# Patient Record
Sex: Female | Born: 1950 | Race: Black or African American | Hispanic: No | Marital: Married | Smoking: Never smoker
Health system: Southern US, Community
[De-identification: ages and names within clinical notes are randomized; demographics above are authoritative.]

## PROBLEM LIST (undated history)

## (undated) DIAGNOSIS — C50919 Malignant neoplasm of unspecified site of unspecified female breast: Secondary | ICD-10-CM

## (undated) DIAGNOSIS — F419 Anxiety disorder, unspecified: Secondary | ICD-10-CM

## (undated) DIAGNOSIS — I499 Cardiac arrhythmia, unspecified: Secondary | ICD-10-CM

## (undated) DIAGNOSIS — M797 Fibromyalgia: Secondary | ICD-10-CM

## (undated) DIAGNOSIS — Z9221 Personal history of antineoplastic chemotherapy: Secondary | ICD-10-CM

## (undated) DIAGNOSIS — Z923 Personal history of irradiation: Secondary | ICD-10-CM

## (undated) DIAGNOSIS — M1990 Unspecified osteoarthritis, unspecified site: Secondary | ICD-10-CM

## (undated) DIAGNOSIS — I4891 Unspecified atrial fibrillation: Secondary | ICD-10-CM

## (undated) DIAGNOSIS — C50911 Malignant neoplasm of unspecified site of right female breast: Secondary | ICD-10-CM

## (undated) DIAGNOSIS — K219 Gastro-esophageal reflux disease without esophagitis: Secondary | ICD-10-CM

## (undated) DIAGNOSIS — R200 Anesthesia of skin: Secondary | ICD-10-CM

## (undated) DIAGNOSIS — I471 Supraventricular tachycardia, unspecified: Secondary | ICD-10-CM

## (undated) DIAGNOSIS — E7800 Pure hypercholesterolemia, unspecified: Secondary | ICD-10-CM

## (undated) DIAGNOSIS — Z803 Family history of malignant neoplasm of breast: Secondary | ICD-10-CM

## (undated) DIAGNOSIS — E785 Hyperlipidemia, unspecified: Secondary | ICD-10-CM

## (undated) DIAGNOSIS — H269 Unspecified cataract: Secondary | ICD-10-CM

## (undated) DIAGNOSIS — R002 Palpitations: Secondary | ICD-10-CM

## (undated) DIAGNOSIS — T7840XA Allergy, unspecified, initial encounter: Secondary | ICD-10-CM

## (undated) DIAGNOSIS — M8580 Other specified disorders of bone density and structure, unspecified site: Secondary | ICD-10-CM

## (undated) DIAGNOSIS — D649 Anemia, unspecified: Secondary | ICD-10-CM

## (undated) DIAGNOSIS — G43909 Migraine, unspecified, not intractable, without status migrainosus: Secondary | ICD-10-CM

## (undated) DIAGNOSIS — R7303 Prediabetes: Secondary | ICD-10-CM

## (undated) DIAGNOSIS — F418 Other specified anxiety disorders: Secondary | ICD-10-CM

## (undated) DIAGNOSIS — R12 Heartburn: Secondary | ICD-10-CM

## (undated) HISTORY — PX: BREAST SURGERY: SHX581

## (undated) HISTORY — DX: Unspecified osteoarthritis, unspecified site: M19.90

## (undated) HISTORY — DX: Anesthesia of skin: R20.0

## (undated) HISTORY — DX: Other specified anxiety disorders: F41.8

## (undated) HISTORY — PX: OTHER SURGICAL HISTORY: SHX169

## (undated) HISTORY — DX: Heartburn: R12

## (undated) HISTORY — DX: Gastro-esophageal reflux disease without esophagitis: K21.9

## (undated) HISTORY — DX: Anxiety disorder, unspecified: F41.9

## (undated) HISTORY — DX: Supraventricular tachycardia: I47.1

## (undated) HISTORY — DX: Supraventricular tachycardia, unspecified: I47.10

## (undated) HISTORY — DX: Hyperlipidemia, unspecified: E78.5

## (undated) HISTORY — PX: BREAST EXCISIONAL BIOPSY: SUR124

## (undated) HISTORY — DX: Anemia, unspecified: D64.9

## (undated) HISTORY — DX: Unspecified atrial fibrillation: I48.91

## (undated) HISTORY — DX: Family history of malignant neoplasm of breast: Z80.3

## (undated) HISTORY — DX: Other specified disorders of bone density and structure, unspecified site: M85.80

## (undated) HISTORY — DX: Allergy, unspecified, initial encounter: T78.40XA

## (undated) HISTORY — DX: Cardiac arrhythmia, unspecified: I49.9

## (undated) HISTORY — DX: Unspecified cataract: H26.9

## (undated) HISTORY — DX: Palpitations: R00.2

## (undated) HISTORY — DX: Prediabetes: R73.03

## (undated) HISTORY — DX: Malignant neoplasm of unspecified site of unspecified female breast: C50.919

## (undated) HISTORY — DX: Fibromyalgia: M79.7

## (undated) HISTORY — DX: Pure hypercholesterolemia, unspecified: E78.00

## (undated) HISTORY — DX: Migraine, unspecified, not intractable, without status migrainosus: G43.909

---

## 1985-07-05 HISTORY — PX: ABDOMINAL HYSTERECTOMY: SHX81

## 1995-04-19 DIAGNOSIS — J45901 Unspecified asthma with (acute) exacerbation: Secondary | ICD-10-CM | POA: Insufficient documentation

## 1995-04-19 DIAGNOSIS — B3781 Candidal esophagitis: Secondary | ICD-10-CM | POA: Insufficient documentation

## 1998-09-17 ENCOUNTER — Other Ambulatory Visit: Admission: RE | Admit: 1998-09-17 | Discharge: 1998-09-17 | Payer: Self-pay | Admitting: Obstetrics and Gynecology

## 1999-05-14 ENCOUNTER — Encounter: Admission: RE | Admit: 1999-05-14 | Discharge: 1999-05-14 | Payer: Self-pay | Admitting: Emergency Medicine

## 1999-11-23 ENCOUNTER — Emergency Department (HOSPITAL_COMMUNITY): Admission: EM | Admit: 1999-11-23 | Discharge: 1999-11-23 | Payer: Self-pay | Admitting: *Deleted

## 1999-12-02 ENCOUNTER — Encounter: Admission: RE | Admit: 1999-12-02 | Discharge: 1999-12-02 | Payer: Self-pay | Admitting: Emergency Medicine

## 1999-12-23 ENCOUNTER — Other Ambulatory Visit: Admission: RE | Admit: 1999-12-23 | Discharge: 1999-12-23 | Payer: Self-pay | Admitting: *Deleted

## 1999-12-23 ENCOUNTER — Encounter (INDEPENDENT_AMBULATORY_CARE_PROVIDER_SITE_OTHER): Payer: Self-pay

## 2000-02-02 ENCOUNTER — Ambulatory Visit (HOSPITAL_COMMUNITY): Admission: RE | Admit: 2000-02-02 | Discharge: 2000-02-02 | Payer: Self-pay | Admitting: *Deleted

## 2000-02-02 ENCOUNTER — Encounter (INDEPENDENT_AMBULATORY_CARE_PROVIDER_SITE_OTHER): Payer: Self-pay | Admitting: Specialist

## 2000-03-01 ENCOUNTER — Encounter: Admission: RE | Admit: 2000-03-01 | Discharge: 2000-03-01 | Payer: Self-pay | Admitting: *Deleted

## 2000-06-06 ENCOUNTER — Encounter: Admission: RE | Admit: 2000-06-06 | Discharge: 2000-06-06 | Payer: Self-pay | Admitting: Emergency Medicine

## 2000-06-24 DIAGNOSIS — K219 Gastro-esophageal reflux disease without esophagitis: Secondary | ICD-10-CM

## 2000-08-05 ENCOUNTER — Encounter: Admission: RE | Admit: 2000-08-05 | Discharge: 2000-11-03 | Payer: Self-pay | Admitting: Gastroenterology

## 2000-12-06 ENCOUNTER — Encounter: Admission: RE | Admit: 2000-12-06 | Discharge: 2000-12-06 | Payer: Self-pay | Admitting: Emergency Medicine

## 2001-01-30 ENCOUNTER — Other Ambulatory Visit: Admission: RE | Admit: 2001-01-30 | Discharge: 2001-01-30 | Payer: Self-pay | Admitting: *Deleted

## 2001-03-18 ENCOUNTER — Emergency Department (HOSPITAL_COMMUNITY): Admission: EM | Admit: 2001-03-18 | Discharge: 2001-03-18 | Payer: Self-pay | Admitting: Emergency Medicine

## 2001-06-30 ENCOUNTER — Encounter: Admission: RE | Admit: 2001-06-30 | Discharge: 2001-06-30 | Payer: Self-pay | Admitting: *Deleted

## 2001-07-26 ENCOUNTER — Encounter: Admission: RE | Admit: 2001-07-26 | Discharge: 2001-07-26 | Payer: Self-pay | Admitting: Emergency Medicine

## 2001-09-28 ENCOUNTER — Ambulatory Visit (HOSPITAL_COMMUNITY): Admission: RE | Admit: 2001-09-28 | Discharge: 2001-09-28 | Payer: Self-pay | Admitting: Emergency Medicine

## 2001-10-18 ENCOUNTER — Ambulatory Visit (HOSPITAL_COMMUNITY): Admission: RE | Admit: 2001-10-18 | Discharge: 2001-10-18 | Payer: Self-pay | Admitting: Emergency Medicine

## 2001-10-31 ENCOUNTER — Observation Stay (HOSPITAL_COMMUNITY): Admission: RE | Admit: 2001-10-31 | Discharge: 2001-10-31 | Payer: Self-pay | Admitting: Interventional Radiology

## 2001-11-30 ENCOUNTER — Encounter: Admission: RE | Admit: 2001-11-30 | Discharge: 2001-11-30 | Payer: Self-pay | Admitting: Emergency Medicine

## 2001-11-30 ENCOUNTER — Encounter: Payer: Self-pay | Admitting: Emergency Medicine

## 2001-12-18 ENCOUNTER — Ambulatory Visit (HOSPITAL_COMMUNITY): Admission: RE | Admit: 2001-12-18 | Discharge: 2001-12-18 | Payer: Self-pay | Admitting: Emergency Medicine

## 2001-12-18 ENCOUNTER — Encounter: Payer: Self-pay | Admitting: Emergency Medicine

## 2002-01-16 ENCOUNTER — Encounter: Payer: Self-pay | Admitting: Emergency Medicine

## 2002-01-16 ENCOUNTER — Encounter: Admission: RE | Admit: 2002-01-16 | Discharge: 2002-01-16 | Payer: Self-pay | Admitting: Emergency Medicine

## 2002-01-19 ENCOUNTER — Other Ambulatory Visit: Admission: RE | Admit: 2002-01-19 | Discharge: 2002-01-19 | Payer: Self-pay | Admitting: *Deleted

## 2002-06-27 ENCOUNTER — Encounter: Payer: Self-pay | Admitting: *Deleted

## 2002-06-27 ENCOUNTER — Encounter: Admission: RE | Admit: 2002-06-27 | Discharge: 2002-06-27 | Payer: Self-pay | Admitting: *Deleted

## 2002-11-15 ENCOUNTER — Encounter: Admission: RE | Admit: 2002-11-15 | Discharge: 2002-11-15 | Payer: Self-pay | Admitting: Emergency Medicine

## 2002-11-15 ENCOUNTER — Encounter: Payer: Self-pay | Admitting: Emergency Medicine

## 2002-12-28 ENCOUNTER — Other Ambulatory Visit: Admission: RE | Admit: 2002-12-28 | Discharge: 2002-12-28 | Payer: Self-pay | Admitting: *Deleted

## 2003-02-04 ENCOUNTER — Ambulatory Visit (HOSPITAL_COMMUNITY): Admission: RE | Admit: 2003-02-04 | Discharge: 2003-02-04 | Payer: Self-pay | Admitting: Gastroenterology

## 2003-02-04 ENCOUNTER — Encounter: Payer: Self-pay | Admitting: Gastroenterology

## 2003-11-12 ENCOUNTER — Inpatient Hospital Stay (HOSPITAL_COMMUNITY): Admission: EM | Admit: 2003-11-12 | Discharge: 2003-11-13 | Payer: Self-pay | Admitting: Internal Medicine

## 2004-03-18 ENCOUNTER — Encounter: Admission: RE | Admit: 2004-03-18 | Discharge: 2004-03-18 | Payer: Self-pay | Admitting: Emergency Medicine

## 2004-05-26 ENCOUNTER — Encounter: Admission: RE | Admit: 2004-05-26 | Discharge: 2004-05-26 | Payer: Self-pay | Admitting: Emergency Medicine

## 2004-10-13 ENCOUNTER — Ambulatory Visit: Payer: Self-pay | Admitting: Gastroenterology

## 2005-08-07 ENCOUNTER — Encounter: Admission: RE | Admit: 2005-08-07 | Discharge: 2005-08-07 | Payer: Self-pay | Admitting: Emergency Medicine

## 2005-10-25 ENCOUNTER — Ambulatory Visit (HOSPITAL_COMMUNITY): Admission: RE | Admit: 2005-10-25 | Discharge: 2005-10-25 | Payer: Self-pay | Admitting: Obstetrics

## 2005-10-28 ENCOUNTER — Encounter: Admission: RE | Admit: 2005-10-28 | Discharge: 2005-10-28 | Payer: Self-pay | Admitting: Obstetrics

## 2005-11-04 ENCOUNTER — Encounter: Admission: RE | Admit: 2005-11-04 | Discharge: 2005-11-04 | Payer: Self-pay | Admitting: Obstetrics

## 2006-02-17 ENCOUNTER — Ambulatory Visit: Payer: Self-pay | Admitting: Gastroenterology

## 2006-11-10 ENCOUNTER — Encounter: Admission: RE | Admit: 2006-11-10 | Discharge: 2006-11-10 | Payer: Self-pay | Admitting: Emergency Medicine

## 2007-01-11 ENCOUNTER — Encounter: Admission: RE | Admit: 2007-01-11 | Discharge: 2007-01-11 | Payer: Self-pay | Admitting: Emergency Medicine

## 2007-01-20 ENCOUNTER — Ambulatory Visit: Payer: Self-pay | Admitting: Gastroenterology

## 2007-02-08 ENCOUNTER — Ambulatory Visit: Payer: Self-pay | Admitting: Gastroenterology

## 2007-02-08 ENCOUNTER — Encounter (INDEPENDENT_AMBULATORY_CARE_PROVIDER_SITE_OTHER): Payer: Self-pay | Admitting: *Deleted

## 2007-02-08 ENCOUNTER — Encounter: Payer: Self-pay | Admitting: Gastroenterology

## 2007-05-16 ENCOUNTER — Encounter: Admission: RE | Admit: 2007-05-16 | Discharge: 2007-05-16 | Payer: Self-pay | Admitting: Emergency Medicine

## 2007-07-26 ENCOUNTER — Encounter
Admission: RE | Admit: 2007-07-26 | Discharge: 2007-07-26 | Payer: Self-pay | Admitting: Physical Medicine and Rehabilitation

## 2007-08-18 ENCOUNTER — Encounter: Admission: RE | Admit: 2007-08-18 | Discharge: 2007-08-18 | Payer: Self-pay | Admitting: Emergency Medicine

## 2008-01-11 ENCOUNTER — Encounter: Admission: RE | Admit: 2008-01-11 | Discharge: 2008-01-11 | Payer: Self-pay | Admitting: Emergency Medicine

## 2008-01-17 ENCOUNTER — Encounter: Admission: RE | Admit: 2008-01-17 | Discharge: 2008-01-17 | Payer: Self-pay | Admitting: Emergency Medicine

## 2008-02-20 ENCOUNTER — Encounter: Payer: Self-pay | Admitting: Gastroenterology

## 2008-07-11 ENCOUNTER — Ambulatory Visit: Payer: Self-pay | Admitting: Gastroenterology

## 2008-07-11 DIAGNOSIS — I471 Supraventricular tachycardia: Secondary | ICD-10-CM

## 2008-07-12 ENCOUNTER — Telehealth: Payer: Self-pay | Admitting: Gastroenterology

## 2008-10-01 ENCOUNTER — Encounter: Admission: RE | Admit: 2008-10-01 | Discharge: 2008-10-01 | Payer: Self-pay | Admitting: Family Medicine

## 2009-01-13 ENCOUNTER — Encounter: Admission: RE | Admit: 2009-01-13 | Discharge: 2009-01-13 | Payer: Self-pay | Admitting: Obstetrics

## 2009-01-14 ENCOUNTER — Ambulatory Visit (HOSPITAL_COMMUNITY): Admission: RE | Admit: 2009-01-14 | Discharge: 2009-01-14 | Payer: Self-pay | Admitting: Obstetrics

## 2009-04-11 HISTORY — PX: OTHER SURGICAL HISTORY: SHX169

## 2009-05-27 HISTORY — PX: OTHER SURGICAL HISTORY: SHX169

## 2009-12-25 ENCOUNTER — Emergency Department (HOSPITAL_COMMUNITY): Admission: EM | Admit: 2009-12-25 | Discharge: 2009-12-25 | Payer: Self-pay | Admitting: Emergency Medicine

## 2009-12-25 ENCOUNTER — Telehealth: Payer: Self-pay | Admitting: Gastroenterology

## 2009-12-26 DIAGNOSIS — D071 Carcinoma in situ of vulva: Secondary | ICD-10-CM | POA: Insufficient documentation

## 2009-12-29 ENCOUNTER — Ambulatory Visit: Payer: Self-pay | Admitting: Internal Medicine

## 2009-12-29 ENCOUNTER — Encounter: Payer: Self-pay | Admitting: Physician Assistant

## 2009-12-29 DIAGNOSIS — K589 Irritable bowel syndrome without diarrhea: Secondary | ICD-10-CM | POA: Insufficient documentation

## 2009-12-29 DIAGNOSIS — R141 Gas pain: Secondary | ICD-10-CM

## 2009-12-29 DIAGNOSIS — M1990 Unspecified osteoarthritis, unspecified site: Secondary | ICD-10-CM | POA: Insufficient documentation

## 2009-12-29 DIAGNOSIS — R142 Eructation: Secondary | ICD-10-CM

## 2009-12-29 DIAGNOSIS — R143 Flatulence: Secondary | ICD-10-CM

## 2010-01-01 ENCOUNTER — Encounter: Payer: Self-pay | Admitting: Physician Assistant

## 2010-01-01 ENCOUNTER — Encounter: Payer: Self-pay | Admitting: Gastroenterology

## 2010-02-11 ENCOUNTER — Encounter: Admission: RE | Admit: 2010-02-11 | Discharge: 2010-02-11 | Payer: Self-pay | Admitting: Obstetrics

## 2010-06-01 ENCOUNTER — Encounter: Payer: Self-pay | Admitting: Physician Assistant

## 2010-07-26 ENCOUNTER — Encounter: Payer: Self-pay | Admitting: Emergency Medicine

## 2010-08-04 NOTE — Letter (Signed)
Summary: Addendum/[facility]  Addendum/[facility]   Imported By: Sherian Rein 01/19/2010 08:07:44  _____________________________________________________________________  External Attachment:    Type:   Image     Comment:   External Document

## 2010-08-04 NOTE — Letter (Signed)
Summary: [facility]  [facility]   Imported By: Lester [place] 01/15/2010 09:55:04  _____________________________________________________________________  External Attachment:    Type:   Image     Comment:   External Document

## 2010-08-04 NOTE — Assessment & Plan Note (Signed)
Summary: Reflux?/ DRP patient    History of Present Illness Visit Type: Follow-up Visit Primary GI MD: Sheryn Bison MD FACP FAGA Primary Provider: n/a Requesting Provider: n/a Chief Complaint: post ER visit Reflux History of Present Illness:   PLEASANT 60 YO FEMALE KNOWN TO DR. PATTERSON WITH HX OF GERD. SHE ALSO HAS HX OF SVT-FOLLOWED BY DR. Alanda Amass. SHE HAS BEEN MAINTAINED ON  NEXIUM IN THE PAST WITH GOOD CONTROL BUT WAS TAKWN OFF ONCE IN THE PAST DUE TO ? OF DRUG INTERACTION WITH CARDIAC MEDS-HER SXS FLARED AND SHE WAS PUT BACK ON NEXIUM. AT THIS TIME SHE WAS TAKEN OFF NEXIUM ABOUT 8 MONTHS AGO AS HER SVT WAS NOT WELL CONTROLLED. SHE SAYS SHE THEN DID A 30 DAY HOLTER MONITOR THAT SHOWED SVT WHILE OFF NEXIUM. SHE HAS FLECAINIDE ADDED TO HER REGIMEN. NOW WITH C/O OPNGOING INDIGESTION,GAS, BLOATING,BELCHING AND HEARTBURN. SHE WENT TO THE ER RECENTLY WITH C/O SUBXYPHOID PAIN/SHARP. CARDIAC WAS RULED OUT AND SHE WAS STARTED ON PEPCID 20 MG DAILY. SHE FEELS BETTER. THE SUBXYPHOID PAIN IS GONE. SHE CONTINUES TO HAVE THE INDIGESTON,BLOATING,GAS ETC. SHE IS LACTOSE INTOLERANT,AVOIDS LACTOSE. SHE HAS BEEN ON AN NSAID FOR YEARS-FEELS BAD IF OFF IT FOR ANY LENGTH OF TIME.   GI Review of Systems    Reports abdominal pain, acid reflux, belching, bloating, and  heartburn.     Location of  Abdominal pain: epigastric area.    Denies chest pain, dysphagia with liquids, dysphagia with solids, loss of appetite, nausea, vomiting, vomiting blood, and  weight loss.      Reports irritable bowel syndrome.     Denies anal fissure, black tarry stools, change in bowel habit, constipation, diarrhea, diverticulosis, fecal incontinence, heme positive stool, hemorrhoids, jaundice, light color stool, liver problems, rectal bleeding, and  rectal pain.    Current Medications (verified): 1)  Levsin 0.125 Mg Tabs (Hyoscyamine Sulfate) .[name].. 1 Tablet By Mouth Every 4-6 Hours 2)  Toprol Xl 25 Mg Xr24h-Tab (Metoprolol  Succinate) .[name].. 1 and 1/2 Tablet Once Daily 3)  Diclofenac Sodium 75 Mg Tbec (Diclofenac Sodium) .[name].. 1 Tablet Two Times A Day 4)  Multivitamins   Tabs (Multiple Vitamin) .[name].. 1 Once Daily 5)  Flecainide Acetate 100 Mg Tabs (Flecainide Acetate) .[name].. 1 By Mouth Two Times A Day 6)  Crestor 5 Mg Tabs (Rosuvastatin Calcium) .[name].. 1 By Mouth Once Daily 7)  Paroxetine Hcl 20 Mg Tabs (Paroxetine Hcl) .[name].. 1 By Mouth Once Daily 8)  Famotidine 20 Mg Tabs (Famotidine) .[name].. 1 By Mouth Once Daily 9)  Gabapentin 300 Mg Caps (Gabapentin) .... 3 By Mouth Once Daily 10)  Vicodin 5-500 Mg Tabs (Hydrocodone-Acetaminophen) .... As Needed At Bedtime  Allergies (verified): 1)  ! Sulfa 2)  ! Biaxin  Past History:  Past Medical History: Current Problems:  PEPTIC STRICTURE (ICD-537.89) IRRITABLE BOWEL SYNDROME, HX OF (ICD-V12.79) GERD (ICD-530.81) ASTHMA UNSPECIFIED WITH EXACERBATION (ICD-493.92) CANDIDIASIS, ESOPHAGEAL (ICD-112.84) SVT OSTEOARTHRITIS OSTEOPOROSIS  Past Surgical History: Reviewed history from 12/26/2009 and no changes required. Hysterectomy-partial Left Breast Biopsy Wide local excision of vulva  Family History: Reviewed history from 12/26/2009 and no changes required. No FH of Colon Cancer: Family History of Stomach Cancer:aunt Family History of Breast Cancer:siblings Family History of Heart Disease:  Uncle-PUD  Social History: Reviewed history from 07/11/2008 and no changes required. Patient has never smoked.  Alcohol Use - no Illicit Drug Use - no  Review of Systems  The patient denies allergy/sinus, anemia, anxiety-new, arthritis/joint pain, back pain, blood in urine, breast  changes/lumps, change in vision, confusion, cough, coughing up blood, depression-new, fainting, fatigue, fever, headaches-new, hearing problems, heart murmur, heart rhythm changes, itching, menstrual pain, muscle pains/cramps, night sweats, nosebleeds, pregnancy symptoms, shortness of breath, skin  rash, sleeping problems, sore throat, swelling of feet/legs, swollen lymph glands, thirst - excessive , urination - excessive , urination changes/pain, urine leakage, vision changes, and voice change.         OTHERWISE AS IN HPI  Vital Signs:  Patient profile:   60 year old female Height:      64 inches Weight:      150 pounds BMI:     25.84 Pulse rate:   70 / minute Pulse rhythm:   regular BP sitting:   130 / 62  (right arm)  Vitals Entered By: Chales Abrahams CMA Duncan Dull) (December 29, 2009 3:09 PM)  Physical Exam  General:  Well developed, well nourished, no acute distress. Head:  Normocephalic and atraumatic. Eyes:  PERRLA, no icterus. Lungs:  Clear throughout to auscultation. Heart:  Regular rate and rhythm; no murmurs, rubs,  or bruits. Abdomen:  SOFT, BASICALLY NONTENDER, BS HYPERACTIVE,SLIGHT DISTENTION, NO MASS OR HSM Rectal:  NOT DONE Extremities:  No clubbing, cyanosis, edema or deformities noted. Neurologic:  Alert and  oriented x4;  grossly normal neurologically. Psych:  Alert and cooperative. Normal mood and affect.   Impression & Recommendations:  Problem # 1:  GERD (ICD-530.81) Assessment Deteriorated RECENT EXACERBATION WITH SUBXYPHOID PAIN-IMPROVED ON PEPCID.  CONTINUE PEPCID, BUT INCREASE DOSE TO TWICE DAILY FOR NOW WILL DISCUSS WITH DR Alanda Amass REGARDING RESTARTING NEXIUM 40 MG DAILY. DISCUSSED PPI'S,INCREASED OSTEOPOROSIS RISK ETC-SHE WILL RESTART CALCIUM /VIT D 1200-1500 MG DAILY  Problem # 2:  FLATULENCE-GAS-BLOATING (ICD-787.3) Assessment: Unchanged CONSISTENT WITH IBS/DYSPEPSIA  TRIAL OF ALIGN ONE DAILY X 30 DAYS FOLLOW UP WITH DR. PATTERSON AS NEEDED.  Problem # 3:  PSVT (ICD-427.0) Assessment: Comment Only  Problem # 4:  OSTEOARTHRITIS (ICD-715.9) Assessment: Comment Only  Patient Instructions: 1)  Please increase your Pepcid to 20 mg 1 tablet two times a day. A new prescription has been sent electronically to your pharmacy for this  medication. 2)  Please take Align 1 capsule by mouth once daily x 30 days. This can be purchased over the counter. 3)  Begin an over the counter Calcium + Vitamin D supplement. You should try to get 1,200-1,500 mg tablets to take once daily. 4)  We have sent a letter to Dr Alanda Amass for clearance to start you  back on Nexium. You should hear from [place] regarding this very soon. Should you not, please call us. 5)  Copy sent to : Dr Sheryn Bison 6)  The medication list was reviewed and reconciled.  All changed / newly prescribed medications were explained.  A complete medication list was provided to the patient / caregiver. Prescriptions: LEVSIN 0.125 MG TABS (HYOSCYAMINE SULFATE) 1 tablet by mouth every 4-6 hours  #30 x 2   Entered by:   Lamona Curl CMA (AAMA)   Authorized by:   Sammuel Cooper PA-c   Signed by:   Lamona Curl CMA (AAMA) on 12/29/2009   Method used:   [facility]  [address] [phone][facility] (retail[facility]       [address]       [place], [place]  [address]       Ph: [phone] or [phone]  Fax: [phone]   RxID:   [id number] FAMOTIDINE 20 MG TABS (FAMOTIDINE) Take 1 tablet by mouth two times a day  #60 x 3   Entered by:   Lamona Curl CMA (AAMA)   Authorized by:   Sammuel Cooper PA-c   Signed by:   Lamona Curl CMA (AAMA) on 12/29/2009   Method used:   [facility]        [facility]  [address] [phone][facility] (retail)       [address]       [place], [place]  [address]       Ph: [phone] or [phone]       Fax: [phone]   RxID:   [id number]

## 2010-08-04 NOTE — Letter (Signed)
Summary: ? NEXIUM/[facility]  [facility]'s response on NEXIUM/[facility]   Imported By: Lester Wendell 01/01/2010 08:45:51  _____________________________________________________________________  External Attachment:    Type:   Image     Comment:   External Document

## 2010-08-04 NOTE — Progress Notes (Signed)
Summary: triage   Phone Note Call from Patient Call back at Home Phone [phone] Call back at [phone]   Caller: Patient Call For: Jarold Motto Reason for Call: Talk to Nurse Summary of Call: Patient wants to be seen before first available 7-1 states that she was in the er last night and has pain on her sides Initial call taken by: Tawni Levy,  December 25, 2009 11:39 AM  Follow-up for Phone Call        Pt c/o pain in chest, under left breast.  Pain comes and goes and seems to be worse if stomach is empty.  Went to ER and was todl Heart was OK and that she should see GI.  Appt sch for pt to see Amy Esterwood on 12/29/09 Follow-up by: Ashok Cordia RN,  December 25, 2009 2:18 PM

## 2010-08-04 NOTE — Letter (Signed)
Summary: To Dr Kristopher Oppenheim Nexium?  [facility]  [address] [place], [place] [address]   Phone: [phone]  Fax: [phone]               12/29/2009  Tami [name] [address] [place], [place]  [address]  Dear Dr Alanda Amass,  It has recently come to our attention this patient was asked by your office to discontinue her Nexium due to concerns over a possible drug interaction. Ms.Massi has severe gastroesophageal reflux which was well controlled with Nexium. Since being taken off of the medication, she has again become very symptomatic.   We request your permission to again reintroduce Nexium to Ms.Lawhorne's current medication regimen. We appreciate your prompt response to this matter. Should you have any questions, please contact our office at [phone].  Sincerely,     Amy Esterwood, PA-C  Appended Document: To Dr Kristopher Oppenheim Nexium? Letter faxed on 12/29/09 @ 3:49 pm.

## 2010-08-21 HISTORY — PX: DOPPLER ECHOCARDIOGRAPHY: SHX263

## 2010-09-11 ENCOUNTER — Telehealth: Payer: Self-pay | Admitting: Gastroenterology

## 2010-09-14 ENCOUNTER — Ambulatory Visit (INDEPENDENT_AMBULATORY_CARE_PROVIDER_SITE_OTHER): Payer: BC Managed Care – PPO | Admitting: Physician Assistant

## 2010-09-14 ENCOUNTER — Other Ambulatory Visit: Payer: Self-pay | Admitting: Gastroenterology

## 2010-09-14 ENCOUNTER — Ambulatory Visit (HOSPITAL_COMMUNITY)
Admission: RE | Admit: 2010-09-14 | Discharge: 2010-09-14 | Disposition: A | Discharge status: Home / Self Care | Payer: BC Managed Care – PPO | Source: Ambulatory Visit | Attending: Gastroenterology | Admitting: Gastroenterology

## 2010-09-14 ENCOUNTER — Encounter: Payer: Self-pay | Admitting: Physician Assistant

## 2010-09-14 DIAGNOSIS — R1011 Right upper quadrant pain: Secondary | ICD-10-CM

## 2010-09-14 DIAGNOSIS — I10 Essential (primary) hypertension: Secondary | ICD-10-CM | POA: Insufficient documentation

## 2010-09-14 DIAGNOSIS — R1013 Epigastric pain: Secondary | ICD-10-CM | POA: Insufficient documentation

## 2010-09-14 DIAGNOSIS — R52 Pain, unspecified: Secondary | ICD-10-CM

## 2010-09-14 DIAGNOSIS — R079 Chest pain, unspecified: Secondary | ICD-10-CM | POA: Insufficient documentation

## 2010-09-15 NOTE — Progress Notes (Signed)
Summary: triage   Phone Note Call from Patient Call back at [facility] [phone]   Caller: Patient Call For: Dr Jarold Motto Reason for Call: Talk to Nurse Summary of Call: Patient wants to be seen sooner than first available 4-5 for rlq pain x2 weeks. Initial call taken by: Tawni Levy,  September 11, 2010 11:16 AM  Follow-up for Phone Call        patient has been seeing her primary care for constipation and abdominal pain.  She reports that she was given "something to clean her out", but still feels she is very constipatied.  Patient will come in and see Mike Gip PA on 09/14/10 9:30 Follow-up by: Darcey Nora RN, CGRN,  September 11, 2010 11:56 AM

## 2010-09-20 LAB — CBC
HCT: 36.2 % (ref 36.0–46.0)
MCHC: 34.7 g/dL (ref 30.0–36.0)
RDW: 12.8 % (ref 11.5–15.5)

## 2010-09-20 LAB — URINALYSIS, ROUTINE W REFLEX MICROSCOPIC
Bilirubin Urine: NEGATIVE
Glucose, UA: NEGATIVE mg/dL
Hgb urine dipstick: NEGATIVE
Ketones, ur: NEGATIVE mg/dL
Protein, ur: NEGATIVE mg/dL
Urobilinogen, UA: 0.2 mg/dL (ref 0.0–1.0)
pH: 8 (ref 5.0–8.0)

## 2010-09-20 LAB — POCT CARDIAC MARKERS
CKMB, poc: <1 ng/mL — ABNORMAL LOW (ref 1.0–8.0)
Myoglobin, poc: 55 ng/mL (ref 12–200)

## 2010-09-20 LAB — BASIC METABOLIC PANEL: Sodium: 136 mEq/L (ref 135–145)

## 2010-09-20 LAB — DIFFERENTIAL
Basophils Absolute: 0 10*3/uL (ref 0.0–0.1)
Eosinophils Relative: 1 % (ref 0–5)
Lymphocytes Relative: 36 % (ref 12–46)
Lymphs Abs: 2.5 10*3/uL (ref 0.7–4.0)
Monocytes Absolute: 0.7 10*3/uL (ref 0.1–1.0)

## 2010-09-20 LAB — D-DIMER, QUANTITATIVE: D-Dimer, Quant: 0.3 ug/mL-FEU (ref 0.00–0.48)

## 2010-09-22 NOTE — Letter (Signed)
Summary: [facility]  [facility]   Imported By: Lester [place] 09/16/2010 10:25:06  _____________________________________________________________________  External Attachment:    Type:   Image     Comment:   External Document

## 2010-09-22 NOTE — Assessment & Plan Note (Signed)
Summary: Abdominal Pain    History of Present Illness Visit Type: Follow-up Visit Primary GI MD: Sheryn Bison MD Tami [name] Primary Provider: Clydie Braun Richter,MD Requesting Provider: n/a Chief Complaint: Abdominal Pain, RUQ.Tami KitchenPt states she did a bowel purge recommended by her PCP.Tami KitchenMarland KitchenWhich she did but there is still no relief History of Present Illness:   VERY NICE 60 YO FEMALE KNOWN TO DR. PATTERSON. SHE HAS HX OF GERD, AND IBS. SHE COMES IN TODAY WITH ACUTE RUQ PAIN X 7-8 DAYS. SHE WAS SEEN AT [facility] LAST WEEK ,AND TOLD SHE WAS CONSTIPATED BY ABDOMINAL FILMS. SHE WAS GIVEN A LAXATIVE PURGE. SHE HAS GOOD RESULTS WITH THIS PREP BUT NO IMPROVEMENT IN HER PAIN. SHE DESCRIBES THE PAIN AS CONSTANT,LOCATED IN  THE RUQ UNDER HER BREAST, NONRADIATING. IT IS WORSE WITH MOVEMENT, ESPECIALLY TRYING TO GET OUT OF BED OR IN TO A CAR. SHE FEELS BEST IN A RECLINER. NO FEVER, CHILLS. NO MELENA OR HEME. SHE HAS HAD SOME NAUSEA, NO VOMITING, AND FEELS MORE PRESSURE AFTER EATINGNO DYSURIA, NO SOB. SHE HAS NOT INJURED HERSELF THAT SHE IS AWARE OF , BUT AFTER FURTHER QUESTIONING SHE HAD BEEN MOVING BOXES FROM HER HOUSE TO AN OUTDOOR SHED THE WEEKEND THE PAIN STARTED.   GI Review of Systems    Reports abdominal pain and  nausea.     Location of  Abdominal pain: RUQ.    Denies acid reflux, belching, bloating, chest pain, dysphagia with liquids, dysphagia with solids, heartburn, loss of appetite, vomiting, vomiting blood, weight loss, and  weight gain.        Denies anal fissure, black tarry stools, change in bowel habit, constipation, diarrhea, diverticulosis, fecal incontinence, heme positive stool, hemorrhoids, irritable bowel syndrome, jaundice, light color stool, liver problems, rectal bleeding, and  rectal pain.    Current Medications (verified): 1)  Levsin 0.125 Mg Tabs (Hyoscyamine Sulfate) .Tami [name].. 1 Tablet By Mouth Every 4-6 Hours 2)  Toprol Xl 25 Mg Xr24h-Tab (Metoprolol Succinate) .Tami [name].. 1 and 1/2  Tablet Once Daily 3)  Diclofenac Sodium 75 Mg Tbec (Diclofenac Sodium) .Tami [name].. 1 Tablet Two Times A Day 4)  Multivitamins   Tabs (Multiple Vitamin) .Tami [name].. 1 Once Daily 5)  Flecainide Acetate 50 Mg Tabs (Flecainide Acetate) .Tami [name].. 1 By Mouth Two Times A Day 6)  Crestor 10 Mg Tabs (Rosuvastatin Calcium) .Tami [name].. 1 By Mouth Once Daily 7)  Paroxetine Hcl 20 Mg Tabs (Paroxetine Hcl) .Tami [name].. 1 By Mouth Once Daily 8)  Famotidine 20 Mg Tabs (Famotidine) .... Take 1 Tablet By Mouth Two Times A Day 9)  Gabapentin 300 Mg Caps (Gabapentin) .... 3 By Mouth Once Daily 10)  Vicodin 5-500 Mg Tabs (Hydrocodone-Acetaminophen) .... As Needed At Bedtime 11)  Nexium 40 Mg  Cpdr (Esomeprazole Magnesium) .Tami [name].. 1 Capsule Each Day 30 Minutes Before Meal 12)  Omega Xl .... 2 By Mouth Two Times A Day  Allergies (verified): 1)  ! Sulfa 2)  ! Biaxin  Past History:  Past Surgical History: Last updated: 12/26/2009 Hysterectomy-partial Left Breast Biopsy Wide local excision of vulva  Family History: Last updated: 12/26/2009 No FH of Colon Cancer: Family History of Stomach Cancer:aunt Family History of Breast Cancer:siblings Family History of Heart Disease:  Uncle-PUD  Social History: Last updated: 09/14/2010 Patient has never smoked. ,MARRIED Alcohol Use - no Illicit Drug Use - no  Past Medical History: Current Problems:  PEPTIC STRICTURE (ICD-537.89) IRRITABLE BOWEL SYNDROME, HX OF (ICD-V12.79) GERD (ICD-530.81) ASTHMA UNSPECIFIED WITH EXACERBATION (ICD-493.92) CANDIDIASIS, ESOPHAGEAL (ICD-112.84) HX SVT OSTEOARTHRITIS  OSTEOPOROSIS  Social History: Patient has never smoked. ,MARRIED Alcohol Use - no Illicit Drug Use - no  Review of Systems  The patient denies anorexia, fever, weight loss, weight gain, vision loss, decreased hearing, hoarseness, chest pain, syncope, dyspnea on exertion, peripheral edema, prolonged cough, headaches, hemoptysis, abdominal pain, melena, hematochezia, severe  indigestion/heartburn, hematuria, incontinence, genital sores, muscle weakness, suspicious skin lesions, transient blindness, difficulty walking, depression, unusual weight change, abnormal bleeding, enlarged lymph nodes, angioedema, and breast masses.         SEE HPI  Vital Signs:  Patient profile:   60 year old female Height:      64 inches Weight:      158 pounds BMI:     27.22 BSA:     1.77 Pulse rate:   80 / minute Pulse rhythm:   regular BP sitting:   120 / 70  (left arm)  Vitals Entered By: Merri Ray CMA Duncan Dull) (September 14, 2010 9:43 AM)  Physical Exam  General:  Well developed, well nourished, no acute distress. Head:  Normocephalic and atraumatic. Eyes:  PERRLA, no icterus. Chest Wall:  EXQUISITELY TENDER OVER RIGHT LOWER ANTERIOR  RIBS, NO STERNAL TENDERNESS Lungs:  Clear throughout to auscultation. Heart:  Regular rate and rhythm; no murmurs, rubs,  or bruits. Abdomen:  SOFT, BASICALLY NONTENDER, NO MASS OR HSM,BS+ Rectal:  NOT DONE Extremities:  No clubbing, cyanosis, edema or deformities noted. Neurologic:  Alert and  oriented x4;  grossly normal neurologically. Psych:  Alert and cooperative. Normal mood and affect.anxious.     Impression & Recommendations:  Problem # 1:  RUQ PAIN (ICD-789.01) Assessment New 60 YO FEMALE WITH ONE WEEK HX OF CONSTANT RUQ PAIN  WHICH ON EXAM IS MUSCULOSKELETAL, AND CONSISTENT WITH COSTOCHONDRITIS OR CHEST WALL STRAIN.  LOCAL HEAT NAPROXEN 400MG  TWICE DAILY WITH FOOD OVER NEXT WEEK THEN DECREASE TO 200 MG TWICE DAILY AS NEEDED. NORFLEX ONE TWICE DAILY X 5-7 DAYS AS NEED FOR MUSCLE SPASM RIGHT LOWER ANTERIOR RIB DETAILS  AS PT HAS OSTEOPOROSIS UPPER ABDOMINAL US  -R/O GB DISEASE THOUGH DOUBT. Orders: Ultrasound Abdomen (UAS) GI Misc Procedure/ Radiology Order (GI Misc )  Problem # 2:  IRRITABLE BOWEL SYNDROME (ICD-564.1) Assessment: Comment Only  Problem # 3:  GERD (ICD-530.81) Assessment: Comment Only STABLE ON NEXIUM  40 MG ONCE DAILY  Problem # 4:  HYPERTENSION (ICD-401.9) Assessment: Comment Only  Problem # 5:  OSTEOARTHRITIS (ICD-715.9) Assessment: Comment Only  Patient Instructions: 1)  Take OTC Aleve 2 tab twice daily. 2)  [facility] [address] [address] 3)  We schedueld the Ultrasound and Rib X-ray for today the 12th. 4)  Arrive at [facility] today at 10:45 AM. Don't eat or drink anything untila after the test. 5)  Copy sent to : Dr Nadyne Coombes, MD 6)  The medication list was reviewed and reconciled.  All changed / newly prescribed medications were explained.  A complete medication list was provided to the patient / caregiver. Prescriptions: NORFLEX 100 MG take 1 tab twice daily  #30 x 0   Entered by:   Lowry Ram NCMA   Authorized by:   Sammuel Cooper PA-c   Signed by:   Lowry Ram NCMA on 09/14/2010   Method used:   Printed then faxed to ...       [facility]  [address] [phone]* (retail)       [address]  [place], [place]  [address]       Ph: [phone] or [phone]       Fax: [phone]   RxID:   [id number]

## 2010-11-17 NOTE — Assessment & Plan Note (Signed)
[facility]                         GASTROENTEROLOGY OFFICE NOTE   Tami [name], Tami [name]                    MRN:          [id number]  DATE:01/20/2007                            DOB:          1950-08-16    Tami [name] comes in for a yearly followup but complains of worsening  constipation with gas and bloating.  She is not having acid reflux but  does continue to have some choking sensation in her retropharyngeal  area.   She is taking:  1. Protonix 40 mg twice a day.  2. Toprol XL 25 mg a day.  3. Actonel weekly.  4. Premarin 0.625 mg a day.   All of her acid reflux symptoms are worse several days after taking her  Actonel.   Her last endoscopy was in January 2004, at which time she did have a  peptic stricture that was dilated.  Her last colonoscopy was 10 years  ago.  The patient has had in the past normal ultrasounds of her abdomen  last completed in our office in January 2004.   FAMILY HISTORY:  Noncontributory in terms of colon cancer.   PHYSICAL EXAMINATION:  GENERAL:  She is a healthy-appearing African  American female in no distress appearing her stated age.  VITAL SIGNS:  She weighs 128 pounds, blood pressure 120/82, pulse was 64  and regular.  I could not appreciate stigmata of chronic liver disease.  ORAL CAVITY:  Examination of the tongue and oropharynx were normal  without evidence of Candida plaques.  CHEST:  Clear.  HEART:  She was in a regular rhythm without murmurs, gallops, or rubs.  ABDOMEN:  She had no hepatosplenomegaly, masses, or tenderness.  Bowel  sounds were normal.  RECTAL:  Deferred.   ASSESSMENT:  1. Chronic gastroesophageal reflux disease and probable recurrent      peptic stricture of the esophagus.  She does have a history of      previous Candida esophagitis, but I cannot see oral lesions at this      time.  2. Chronic functional constipation with a need for followup      colonoscopy which has not been done in  the last 10 years.  3. Probable irritable bowel syndrome.  She previously was seen on      February 17, 2006, with gas and diarrhea and seemed to respond to      empiric treatment for bacterial overgrowth syndrome with Xifaxan      and Probiotic therapy at that time.  4. Osteoporosis on Actonel therapy.  5. History of CREST syndrome with a history of chronically elevated      sed rate.  I suspect that she may have an associated esophageal      motility disorder with her Raynaud phenomenon.  This certainly      would lead to chronic reflux necessitating twice a day proton pump      inhibitor therapy.   RECOMMENDATIONS:  1. I have renewed her Protonix 40 mg twice a day before breakfast and      supper.  2. High fiber diet and fiber supplements as tolerated.  3. She also has been urged to use liberal p.o. fluids.  4. Followup endoscopy and colonoscopy exams with possible esophageal      dilatation.  5. Continue other medications per Dr. Lorenz [name].     Tami Rea. Jarold Motto, MD, Caleen Essex, FAGA  Electronically Signed    DRP/MedQ  DD: 01/20/2007  DT: 01/20/2007  Job #: [id number]   cc:   Tami [name], M.D.

## 2010-11-20 NOTE — Discharge Summary (Signed)
NAME:  Tami [name], Tami [name]                       ACCOUNT NO.:  [id number]   MEDICAL RECORD NO.:  [id number]                   PATIENT TYPE:  INP   LOCATION:  6524                                 FACILITY:  [facility]   PHYSICIAN:  Richard A. Alanda Amass, M.D.          DATE OF BIRTH:  [birth year]   DATE OF ADMISSION:  11/12/2003  DATE OF DISCHARGE:  11/13/2003                                 DISCHARGE SUMMARY   DISCHARGE DIAGNOSES:  1. Chest pain, ruled out myocardial infarction protocol by negative serial     enzymes and no electrocardiogram changes.  Improved at the time of     discharge. Could be secondary to known gastroesophageal reflux disease.  2. Gastroesophageal reflux disease on chronic high-dose PPI therapy.  3. Asthma.  4. Anxiety.  5. History of palpitations and paroxysmal atrial fibrillation.   HISTORY OF PRESENT ILLNESS:  This is a 60 year old, pleasant, African-  American female with a history of PAF and palpitations.  She developed mid  substernal and upper epigastric pain during lunch time yesterday which she  described as being 10/10 and lasted approximately 10 minutes. She was very  anxious and short of breath at that time but did not have any nausea or  diaphoresis.  The patient presented to the emergency room, was assessed by  Dr. Sherrie Mustache, internal medicine physician, and our group was called for  consult.  Dr. Alanda Amass saw the patient and, because of the nature of  clinical complaints and difficulties differentiating this pain from her  gastroesophageal reflux disease and positive family history of coronary  artery disease, she was admitted for observation and rule out MI protocol.   HOSPITAL COURSE:  During admission, she still had some mild chest  discomfort, but it was significantly improved after sublingual  nitroglycerin.  Serial enzymes were negative for myocardial infarction, and  EKG did not show any significant ST-T wave changes.   The next morning, the  patient was assessed by Dr. Allyson Sabal and scheduled for  outpatient Cardiolite in our office today.  Then she will be seen by Dr.  Alanda Amass in followup.   LABORATORY AND X-RAY DATA:  TSH and lipid profile are pending.  Cardiac  enzymes negative x 3.  Sodium 138, potassium 3.5.  She was given 40 mEq  potassium prior to her discharge.  CO2 22, chloride 113, BUN 7, creatinine  0.8, serum glucose 124.  CBC showed white blood cells 5.8, hemoglobin 12.3,  hematocrit 36, platelet count 218.   DISPOSITION:  The patient is being discharged in stable condition from [facility] for outpatient Cardiolite stress test in our office.   She was instructed to hold Toprol today prior to the test and, for now, hold  Diltiazem since her blood pressure today is 110 and heart rate 62.  Following recommendations will be based on the result of Cardiolite and  followup appointment with Dr. Alanda Amass.      [employer]  Carolyne Littles.                    Richard A. Alanda Amass, M.D.    MK/MEDQ  D:  11/13/2003  T:  11/13/2003  Job:  [id number]

## 2010-11-20 NOTE — Op Note (Signed)
[facility]  Patient:    Tami [name]                     MRN: [id number] Proc. Date: 02/02/00 Adm. Date:  [id number] Attending:  Pleas Koch CC:         Reuben Likes, M.D.                           Operative Report  PREOPERATIVE DIAGNOSIS:       Carcinoma in situ of vulva.  POSTOPERATIVE DIAGNOSIS:      Carcinoma in situ of vulva.  OPERATION:                    Wide local excision of vulva.  SURGEON:                      Georgina Peer, M.D.  ASSISTANT:  ANESTHESIA:                   IV sedation with 13 cc of 1% Xylocaine plus 0.25% Marcaine plain mixed 50/50.  ESTIMATED BLOOD LOSS:         Less than 20 cc.  COMPLICATIONS:                None.  INDICATIONS:                  A 60 year old African-American female with a vulvar lesion which was biopsied showing a carcinoma in situ with condylomatous change.  The patient is brought in for wide local excision.  She had no inguinal lymphadenopathy or induration around the area.  DESCRIPTION OF PROCEDURE:     The patient was taken to the operating room and placed in the dorsal lithotomy position.  She was given IV sedation and the area was infiltrated with 13 cc of a mixture of 1% Xylocaine and 0.25% Marcaine plain, a total of 13 cc.  After the anesthesia was documented with Rennis Harding test, an elliptical incision was made 1 cm from the lesion in all directions. The lesion was excised in the curvilinear fashion approximately 1 cm deep.  The defect was cauterized for bleeders and closed in three layers, two deep layers and a subcuticular layer using Vicryl.  There was excellent hemostasis.  The specimen was pinned on cork board on sent to pathology as marked.  Sponge, needle and instrument counts correct.  The patient tolerated the procedure well and was sent to the recovery room in good condition. DD:  02/02/00 TD:  02/03/00 Job: [id number]

## 2010-11-20 NOTE — Consult Note (Signed)
NAME:  Tami [name], Tami [name]                       ACCOUNT NO.:  [id number]   MEDICAL RECORD NO.:  [id number]                   PATIENT TYPE:  INP   LOCATION:  1830                                 FACILITY:  [facility]   PHYSICIAN:  Richard A. Alanda Amass, M.D.          DATE OF BIRTH:  31-Dec-1950   DATE OF CONSULTATION:  DATE OF DISCHARGE:                                   CONSULTATION   CONSULTING PHYSICIAN:  Richard A. Alanda Amass, M.D.   REFERRING PHYSICIANS:  1. Reuben Likes, M.D.  2. Elliot Cousin, M.D.   CHIEF COMPLAINT:  Chest pain.   BRIEF HISTORY:  The patient is a 60 year old black female with a history of  PAF or palpitations.  Today, she had lunch and developed mid sternum lower  one third chest pain which she described as 10/10.  It lasted approximately  10 minutes.  She was anxious, was short of breath, and had palpitations  during the episode.  She denied diaphoresis and no nausea.  She took a  Lopressor which she takes for her palpitations and then when the EMTs  arrived, she was given a nitro, and her pain was completely relieved.  This  occurred around 12:30 or 1 p.m. today.  She has had no pain since.  She has  been seen in the ER and Dr. Alanda Amass was contacted around 9 tonight and  asked to see the patient.  Her point-of-care markers are all negative.  Admission labs thus far show a pH of 7.37, pCO2 of 41, a bicarb of 23.  White count is 5.8, hemoglobin is 12.2, hematocrit is 36, platelet count is  218,000.  Chemistries showed a sodium of 140, a potassium of 4, chloride of  107, glucose of 85, and a BUN of 11.  There is no creatinine available.   PAST MEDICAL HISTORY:  1. Positive for palpitations.  2. GERD.  3. She had a hysterectomy for a precancerous growth.   ALLERGIES:  None known.   CURRENT MEDICATIONS:  1. Diltiazem 180 mg every day.  2. Toprol XL 37.5 mg every day.  3. Protonix 40 mg b.i.d.  4. Flonase nasal spray daily.  5. Claritin 10 mg every  day.   FAMILY HISTORY:  Her mother is living at age 52 in good health with some  hypertension.  Her father died of a trauma.  He had a history of coronary  artery disease and phlebitis.  He died at age 66.  She has one brother in  good health.  Two sisters, one with diabetes, hypertension, prior MI and the  second sister is described as having major problems.   SOCIAL HISTORY:  The patient is married.  She has two daughters, one  grandchild.  She is a Geophysicist/field seismologist in kindergarten and describes her  current life as fairly stressful.   REVIEW OF SYSTEMS:  CV:  Frequent headaches, 1-2 per week with high stress.  No history  of stroke.  No history of syncope.  PULMONARY:  She has never  smoked.  No history of wheezing.  No PND.  No orthopnea.  She does have a  history of asthma, and it is usually exacerbated with upper respiratory  tract infections.  GI:  Positive for gastroesophageal reflux disease.  It  has been a long term problem.  She also has intermittent diarrhea and  constipation.  She complains of her calf aching frequently.  She has a  history of osteoporosis.   SOCIAL HISTORY:  She is married, lives with her husband, has two grown  children, one granddaughter, and works as a Geophysicist/field seismologist.  She has  never smoked.  She does not use alcohol.  No drug history.   PHYSICAL EXAMINATION:  GENERAL:  This is a well-developed, well-nourished,  anxious, white female in no acute distress.  VITAL SIGNS:  Her blood pressure, on admission, was 118/78, pulse was 56,  respirations were 20, temperature was 97.  HEENT:  Within normal limits.  NECK:  No bruits.  CHEST:  Clear to auscultation and percussion.  HEART:  No murmurs, gallops or rubs.  ABDOMEN:  Soft, nontender.  Positive bowel sounds.  No hepatosplenomegaly.  GU:  Deferred.  RECTAL:  Deferred.  EXTREMITIES:  No clubbing, cyanosis, or edema.  She has good distal pulses.  NEUROLOGIC:  No focal deficits.   IMPRESSION:  1.  Chest pain with history of palpitations/paroxysmal atrial fibrillation.  2. Positive gastroesophageal reflux disease.  3. Asthma.  4. Anxiety.  5. Status post hysterectomy for a precancerous growth.   EKG shows a sinus rhythm with a PR interval of 220 msec.  No other ST-T wave  changes.   PLAN:  We will plan to admit overnight and observe, continue her CK and  troponins, recheck her EKG, her TSH, and fasting lipids.  We will make a  final decision about disposition in the morning after that data is  available.     Eber Hong, P.A.                 Richard A. Alanda Amass, M.D.    WDJ/MEDQ  D:  11/12/2003  T:  11/12/2003  Job:  [id number]   cc:   Gerlene Burdock A. Alanda Amass, M.D.  [phone] [address]  [place]  [place] [address]  Fax: [phone]   Reuben Likes, M.D.  [address]  [place]  [place] [address]  Fax: [phone]

## 2010-11-22 ENCOUNTER — Other Ambulatory Visit: Payer: Self-pay | Admitting: Gastroenterology

## 2011-04-08 ENCOUNTER — Other Ambulatory Visit: Payer: Self-pay | Admitting: Obstetrics

## 2011-04-08 ENCOUNTER — Other Ambulatory Visit: Payer: Self-pay | Admitting: Family Medicine

## 2011-04-08 DIAGNOSIS — Z1231 Encounter for screening mammogram for malignant neoplasm of breast: Secondary | ICD-10-CM

## 2011-05-04 ENCOUNTER — Ambulatory Visit: Payer: BC Managed Care – PPO

## 2011-05-31 ENCOUNTER — Ambulatory Visit
Admission: RE | Admit: 2011-05-31 | Discharge: 2011-05-31 | Disposition: A | Discharge status: Home / Self Care | Payer: BC Managed Care – PPO | Source: Ambulatory Visit | Attending: Obstetrics | Admitting: Obstetrics

## 2011-05-31 DIAGNOSIS — Z1231 Encounter for screening mammogram for malignant neoplasm of breast: Secondary | ICD-10-CM

## 2011-06-02 HISTORY — PX: OTHER SURGICAL HISTORY: SHX169

## 2011-07-28 ENCOUNTER — Encounter: Payer: Self-pay | Admitting: Surgery

## 2011-07-30 ENCOUNTER — Encounter: Payer: Self-pay | Admitting: Surgery

## 2011-08-02 ENCOUNTER — Other Ambulatory Visit: Payer: BC Managed Care – PPO

## 2011-08-02 ENCOUNTER — Encounter: Payer: BC Managed Care – PPO | Admitting: Surgery

## 2011-08-20 ENCOUNTER — Encounter: Payer: Self-pay | Admitting: Surgery

## 2011-08-23 ENCOUNTER — Ambulatory Visit (INDEPENDENT_AMBULATORY_CARE_PROVIDER_SITE_OTHER): Payer: BC Managed Care – PPO | Admitting: Surgery

## 2011-08-23 ENCOUNTER — Other Ambulatory Visit: Payer: BC Managed Care – PPO

## 2011-08-23 ENCOUNTER — Encounter: Payer: Self-pay | Admitting: Surgery

## 2011-08-23 VITALS — BP 120/76 | HR 64 | Resp 16 | Ht 63.0 in | Wt 153.0 lb

## 2011-08-23 DIAGNOSIS — R200 Anesthesia of skin: Secondary | ICD-10-CM

## 2011-08-23 DIAGNOSIS — R209 Unspecified disturbances of skin sensation: Secondary | ICD-10-CM

## 2011-08-23 NOTE — Progress Notes (Signed)
[facility]   Patient name: Tami [name] MRN: [id number] DOB: May 22, 1951 Sex: female   Referred by: Dr Alanda Amass  Reason for referral:  Chief Complaint  Patient presents with  . New Evaluation    pain & numbness right side worse than left    HISTORY OF PRESENT ILLNESS: The patient comes in today for evaluation of possible thoracic outlet syndrome. She has had worsening symptoms over the past several months particular in her right arm. She states that occasionally her arm goes numb. She has awakened at night with her a arm and hand in significant pain. She feels constant numbness and tingling in her right fingers and these appear to be aggravated by positional maneuvers particularly when her hands her over her head. She states that she is having a hard time washing her hair because her hand is numb and begins to give out and tingle. She also has problems with working in [employer] duty similar issues. She does describe pins and needles in the tips of her fingers.  The patient also complains of cramping in her right leg. She describes her toes is becoming numb and tingling. Her symptoms are not associated with activity. She is very active and exercises 3 times a week.  The patient has a history of SVT and has been on low-dose flecainide with long acting beta blockers  Past Medical History  Diagnosis Date  . Heart palpitations   . Osteoporosis   . Hyperlipidemia   . Asthma   . SVT (supraventricular tachycardia)   . Numbness of arm     Right Arm  . Anemia     as a child  . Irregular heartbeat     Past Surgical History  Procedure Date  . Abdominal hysterectomy 1987    History   Social History  . Marital Status: Married    Spouse Name: N/A    Number of Children: N/A  . Years of Education: N/A   Occupational History  . Not on file.   Social History Main Topics  . Smoking status: Never Smoker   . Smokeless tobacco: Not on file  .  Alcohol Use:   . Drug Use:   . Sexually Active:    Other Topics Concern  . Not on file   Social History Narrative  . No narrative on file    Family History  Problem Relation Age of Onset  . Cancer Mother     skin cancer  . Diabetes Sister     Borderline  . Hypertension Sister   . Heart attack Sister   . Diabetes Brother     Borderline    Allergies as of 08/23/2011 - Review Complete 08/23/2011  Allergen Reaction Noted  . Clarithromycin  12/26/2009  . Sulfonamide derivatives      Current Outpatient Prescriptions on File Prior to Visit  Medication Sig Dispense Refill  . aspirin 81 MG tablet Take 81 mg by mouth daily.      . flecainide (TAMBOCOR) 100 MG tablet Take 50 mg by mouth 2 (two) times daily.      [name] gabapentin (NEURONTIN) 300 MG capsule Take 300 mg by mouth 3 (three) times daily.      . metoprolol succinate (TOPROL-XL) 50 MG 24 hr tablet Take 50 mg by mouth daily. Take with or immediately following a meal.      . PARoxetine (PAXIL) 20 MG tablet Take 20 mg by mouth every morning.      [name]  acetaminophen (TYLENOL) 500 MG tablet Take 500 mg by mouth every 6 (six) hours as needed.      [name] NEXIUM 40 MG capsule TAKE ONE CAPSULE BY MOUTH 30 MINUTES BEFORE A MEAL  30 capsule  6  . rosuvastatin (CRESTOR) 10 MG tablet Take 10 mg by mouth daily.         REVIEW OF SYSTEMS: Please see history of present illness for pertinent positives. She also suffers with palpitations shortness of breath with exertion pain in legs with walking asthma weakness in arms and legs numbness in her arms and legs and dizziness.  PHYSICAL EXAMINATION: General: The patient appears their stated age.  Vital signs are BP 120/76  Pulse 64  Resp 16  Ht 5\' 3"  (1.6 m)  Wt 153 lb (69.4 kg)  BMI 27.10 kg/m2  SpO2 100% HEENT:  No gross abnormalities Pulmonary: Respirations are non-labored Abdomen: Soft and non-tender  Musculoskeletal: There are no major deformities.   Neurologic: No focal weakness or  paresthesias are detected, Skin: There are no ulcer or rashes noted. Psychiatric: The patient has normal affect. Cardiovascular: There is a regular rate and rhythm without significant murmur appreciated. She has palpable pedal pulses bilaterally. She has a palpable right radial pulse which does not significantly changed with provocative maneuvers.  Diagnostic Studies: I have reviewed her duplex ultrasound from [facility] and  [facility] these findings were consistent with thoracic outlet syndrome. She had significantly diminished waveforms when an Adson's maneuver was performed.   Assessment:  Possible thoracic outlet syndrome Plan: I told the patient that her symptoms are somewhat consistent with neurogenic thoracic outlet syndrome. I do not find that this correlates with arterial thoracic outlet. For that reason I have referred her to Dr.D'Amico for further evaluation at [facility].  Regards to the problems in her right leg. I did not feel like these are vascular in origin. I believe that she needs to have her lower back evaluated and have suggested that she see her primary care physician to begin workup for degenerative back disease causing the symptoms in her right leg.     Juleen China IV, M.D. [facility] [place] Office: [phone] Pager:  [phone]

## 2011-09-13 ENCOUNTER — Other Ambulatory Visit: Payer: Self-pay | Admitting: Family Medicine

## 2011-09-13 MED ORDER — PAROXETINE HCL 20 MG PO TABS: 20.0000 mg | ORAL_TABLET | ORAL | Status: DC

## 2011-09-17 ENCOUNTER — Other Ambulatory Visit: Payer: Self-pay | Admitting: *Deleted

## 2011-09-17 MED ORDER — GABAPENTIN 300 MG PO CAPS: 300.0000 mg | ORAL_CAPSULE | Freq: Three times a day (TID) | ORAL | Status: DC

## 2011-12-27 ENCOUNTER — Other Ambulatory Visit: Payer: Self-pay | Admitting: Physician Assistant

## 2012-01-23 ENCOUNTER — Encounter: Payer: Self-pay | Admitting: Family Medicine

## 2012-01-31 ENCOUNTER — Encounter: Payer: Self-pay | Admitting: Family Medicine

## 2012-01-31 ENCOUNTER — Ambulatory Visit (INDEPENDENT_AMBULATORY_CARE_PROVIDER_SITE_OTHER): Payer: BC Managed Care – PPO | Admitting: Family Medicine

## 2012-01-31 VITALS — BP 148/80 | HR 68 | Temp 97.8°F | Resp 16 | Ht 63.0 in | Wt 153.8 lb

## 2012-01-31 DIAGNOSIS — R002 Palpitations: Secondary | ICD-10-CM

## 2012-01-31 DIAGNOSIS — R319 Hematuria, unspecified: Secondary | ICD-10-CM

## 2012-01-31 DIAGNOSIS — R300 Dysuria: Secondary | ICD-10-CM

## 2012-01-31 DIAGNOSIS — R32 Unspecified urinary incontinence: Secondary | ICD-10-CM

## 2012-01-31 LAB — POCT UA - MICROSCOPIC ONLY
Crystals, Ur, HPF, POC: NEGATIVE
Epithelial cells, urine per micros: NEGATIVE
Mucus, UA: POSITIVE
Yeast, UA: NEGATIVE

## 2012-01-31 LAB — POCT URINALYSIS DIPSTICK
Ketones, UA: NEGATIVE
Protein, UA: NEGATIVE
Spec Grav, UA: 1.02
Urobilinogen, UA: 0.2

## 2012-01-31 MED ORDER — PHENAZOPYRIDINE HCL 200 MG PO TABS: 200.0000 mg | ORAL_TABLET | Freq: Three times a day (TID) | ORAL | Status: DC | PRN

## 2012-01-31 MED ORDER — NITROFURANTOIN MONOHYD MACRO 100 MG PO CAPS: 100.0000 mg | ORAL_CAPSULE | Freq: Two times a day (BID) | ORAL | Status: AC

## 2012-01-31 NOTE — Progress Notes (Signed)
@[email or web address] @   Patient ID: Tami [name] MRN: [id number], DOB: [birth year], 61 y.o. Date of Encounter: 01/31/2012, 12:14 PM  Primary Physician: Dois Davenport., MD  Chief Complaint:  Chief Complaint  Patient presents with  . Dysuria    wednesday- has taken pyridium for pain and had old rx of cipro but it made heart race she took for 3 days    HPI: 61 y.o. year old female presents with 5 day history of dysuria, urgency, and frequency. Last UTI was 1 year ago.  hematuria initially.  Taking Cipro for 4 days which did  Not help LMP: 10 years ago. No sick contacts, recent antibiotics, or recent travels.   No vaginal discharge, back pain, fever  Past Medical History  Diagnosis Date  . Heart palpitations   . Osteoporosis   . Hyperlipidemia   . Asthma   . SVT (supraventricular tachycardia)   . Numbness of arm     Right Arm  . Anemia     as a child  . Irregular heartbeat      Home Meds: Prior to Admission medications   Medication Sig Start Date End Date Taking? Authorizing Provider  acetaminophen (TYLENOL) 500 MG tablet Take 500 mg by mouth every 6 (six) hours as needed.   Yes Historical Provider, MD  aspirin 81 MG tablet Take 81 mg by mouth daily.   Yes Historical Provider, MD  ezetimibe (ZETIA) 10 MG tablet Take 10 mg by mouth daily.   Yes Historical Provider, MD  flecainide (TAMBOCOR) 100 MG tablet Take 50 mg by mouth 2 (two) times daily.   Yes Historical Provider, MD  gabapentin (NEURONTIN) 300 MG capsule Take 1 capsule (300 mg total) by mouth 3 (three) times daily. 09/17/11  Yes Morrell Riddle, PA-C  metoprolol succinate (TOPROL-XL) 50 MG 24 hr tablet Take 50 mg by mouth daily. Take with or immediately following a meal.   Yes Historical Provider, MD  PAXIL 20 MG tablet TAKE 1 TABLET DAILY 12/27/11  Yes Ryan M Dunn, PA-C  NEXIUM 40 MG capsule TAKE ONE CAPSULE BY MOUTH 30 MINUTES BEFORE A MEAL 11/22/10   Mardella Layman, MD  rosuvastatin (CRESTOR) 10 MG tablet Take 10 mg by  mouth daily.    Historical Provider, MD    Allergies:  Allergies  Allergen Reactions  . Clarithromycin   . Sulfonamide Derivatives   . Ciprofloxacin Hcl Palpitations    History   Social History  . Marital Status: Married    Spouse Name: N/A    Number of Children: N/A  . Years of Education: N/A   Occupational History  . Not on file.   Social History Main Topics  . Smoking status: Never Smoker   . Smokeless tobacco: Not on file  . Alcohol Use:   . Drug Use:   . Sexually Active:    Other Topics Concern  . Not on file   Social History Narrative  . No narrative on file     Review of Systems: Constitutional: negative for chills, fever, night sweats or weight changes Cardiovascular: negative for chest pain or palpitations Respiratory: negative for hemoptysis, wheezing, or shortness of breath Abdominal: negative for abdominal pain, nausea, vomiting or diarrhea Dermatological: negative for rash Neurologic: negative for headache   Physical Exam: Blood pressure 148/80, pulse 68, temperature 97.8 F (36.6 C), temperature source Oral, resp. rate 16, height 5\' 3"  (1.6 m), weight 153 lb 12.8 oz (69.763 kg), SpO2 99.00%., Body mass index is 27.24 kg/(m^2). General:  Well developed, well nourished, in no acute distress. Head: Normocephalic, atraumatic, eyes without discharge, sclera non-icteric, nares are congested. Bilateral auditory canals clear, TM's are without perforation, pearly grey with reflective cone of light bilaterally. Serous effusion bilaterally behind TM's. Maxillary sinus TTP. Oral cavity moist, dentition normal. Posterior pharynx with post nasal drip and mild erythema. No peritonsillar abscess or tonsillar exudate. Neck: Supple. No thyromegaly. Full ROM. No lymphadenopathy. Lungs: Coarse breath sounds bilaterally without Clear bilaterally to auscultation without wheezes, rales, or rhonchi. Breathing is unlabored.  Heart: RRR with S1 S2. No murmurs, rubs, or gallops  appreciated. Abdomen: Soft, non-tender, non-distended with normoactive bowel sounds. No hepatosplenomegaly. No rebound/guarding. No obvious abdominal masses. McBurney's, Rovsing's, Iliopsoas, and table jar all negative. Msk:  Strength and tone normal for age. Extremities: No clubbing or cyanosis. No edema. Neuro: Alert and oriented X 3. Moves all extremities spontaneously. CNII-XII grossly in tact. Psych:  Responds to questions appropriately with a normal affect.   Labs: Results for orders placed in visit on 01/31/12  POCT URINALYSIS DIPSTICK      Component Value Range   Color, UA yellow     Clarity, UA clear     Glucose, UA neg     Bilirubin, UA neg     Ketones, UA neg     Spec Grav, UA 1.020     Blood, UA small     pH, UA 5.5     Protein, UA neg     Urobilinogen, UA 0.2     Nitrite, UA neg     Leukocytes, UA Negative    POCT UA - MICROSCOPIC ONLY      Component Value Range   WBC, Ur, HPF, POC 0-1     RBC, urine, microscopic 6-7     Bacteria, U Microscopic 1+     Mucus, UA pos     Epithelial cells, urine per micros neg     Crystals, Ur, HPF, POC neg     Casts, Ur, LPF, POC neg     Yeast, UA neg        ASSESSMENT AND PLAN:  61 y.o. year old female with hematuria.   -  Signed, Elvina Sidle, MD 01/31/2012 12:14 PM

## 2012-02-02 LAB — URINE CULTURE
Colony Count: NO GROWTH
Organism ID, Bacteria: NO GROWTH

## 2012-02-03 ENCOUNTER — Ambulatory Visit
Admission: RE | Admit: 2012-02-03 | Discharge: 2012-02-03 | Disposition: A | Discharge status: Home / Self Care | Payer: BC Managed Care – PPO | Source: Ambulatory Visit | Attending: Family Medicine | Admitting: Family Medicine

## 2012-02-03 ENCOUNTER — Other Ambulatory Visit: Payer: Self-pay | Admitting: Family Medicine

## 2012-02-03 DIAGNOSIS — R319 Hematuria, unspecified: Secondary | ICD-10-CM

## 2012-02-03 DIAGNOSIS — R300 Dysuria: Secondary | ICD-10-CM

## 2012-03-23 ENCOUNTER — Other Ambulatory Visit (HOSPITAL_COMMUNITY): Payer: Self-pay | Admitting: Urology

## 2012-03-23 DIAGNOSIS — N135 Crossing vessel and stricture of ureter without hydronephrosis: Secondary | ICD-10-CM

## 2012-03-26 ENCOUNTER — Ambulatory Visit (INDEPENDENT_AMBULATORY_CARE_PROVIDER_SITE_OTHER): Admitting: Family Medicine

## 2012-03-26 VITALS — BP 142/84 | HR 62 | Temp 98.1°F | Resp 18 | Ht 64.0 in | Wt 156.0 lb

## 2012-03-26 DIAGNOSIS — R300 Dysuria: Secondary | ICD-10-CM

## 2012-03-26 DIAGNOSIS — G609 Hereditary and idiopathic neuropathy, unspecified: Secondary | ICD-10-CM

## 2012-03-26 DIAGNOSIS — G629 Polyneuropathy, unspecified: Secondary | ICD-10-CM

## 2012-03-26 LAB — POCT SEDIMENTATION RATE: POCT SED RATE: 28 mm/hr — AB (ref 0–22)

## 2012-03-26 MED ORDER — PHENAZOPYRIDINE HCL 200 MG PO TABS: 200.0000 mg | ORAL_TABLET | Freq: Three times a day (TID) | ORAL | Status: AC | PRN

## 2012-03-26 MED ORDER — PAROXETINE HCL 20 MG PO TABS: 20.0000 mg | ORAL_TABLET | ORAL | Status: DC

## 2012-03-26 MED ORDER — TRAZODONE HCL 100 MG PO TABS: 100.0000 mg | ORAL_TABLET | Freq: Every day | ORAL | Status: DC

## 2012-03-26 NOTE — Progress Notes (Signed)
61 yo retired woman (3 years)  With h/o peripheral neuropathy who has developed right lower leg pain with hypersensitivity x 1 month.  No significant weakness. Had NCV's at [facility] In process of workup with neurologist.  Objective:  Normal inspection of RLE without muscle wasting Reflexes:  Normal AJ and KJ both sides  Clinical Data: Dysuria, hematuria  RENAL/URINARY TRACT ULTRASOUND COMPLETE  Comparison: 09/14/2010  Findings:  Right Kidney: 9.7 cm length. Mild right pelviectasis centrally.  Renal pelvis measures 8 mm. No definite obstruction. Normal  echogenicity and cortex. No atrophy. No focal mass or lesion.  Left Kidney: 10.2 cm length. Negative for hydronephrosis or acute  process. Normal cortex and echogenicity. No atrophy. Mild pelvic  fullness also.  Bladder: Unremarkable by ultrasound. Ureteral jets demonstrated  bilaterally.  IMPRESSION:  Nonspecific mild pelviectasis. Negative for obstruction or  hydronephrosis. No acute finding.  Original Report Authenticated By: Judie Petit. Ruel Favors, M.D.  Very sensitive to touch medial right mid calf  Assessment:  Peripheral neuropathy

## 2012-03-26 NOTE — Patient Instructions (Signed)
Neuropathy Neuropathy means your peripheral nerves are not working normally. Peripheral nerves are the nerves outside the brain and spinal cord. Messages between the brain and the rest of the body do not work properly with peripheral nerve disorders. CAUSES There are many different causes of peripheral nerve disorders. These include:  Injury.   Infections.   Diabetes.   Vitamin deficiency.   Poor circulation.   Alcoholism.   Exposure to toxins.   Drug effects.   Tumors.   Kidney disease.  SYMPTOMS  Tingling, burning, pain, and numbness in the extremities.   Weakness and loss of muscle tone and size.  DIAGNOSIS Blood tests and special studies of nerve function may help confirm the diagnosis.  TREATMENT  Treatment includes adopting healthy life habits.   A good diet, vitamin supplements, and mild pain medicine may be needed.   Avoid known toxins such as alcohol, tobacco, and recreational drugs.   Anti-convulsant medicines are helpful in some types of neuropathy.  Make a follow-up appointment with your caregiver to be sure you are getting better with treatment.  SEEK IMMEDIATE MEDICAL CARE IF:   You have breathing problems.   You have severe or uncontrolled pain.   You notice extreme weakness or you feel faint.   You are not better after 1 week or if you have worse symptoms.  Document Released: 07/29/2004 Document Revised: 03/03/2011 Document Reviewed: 06/21/2005 [employer] Patient Information 2012 [employer]. 

## 2012-03-27 LAB — COMPREHENSIVE METABOLIC PANEL
ALT: 37 U/L — ABNORMAL HIGH (ref 0–35)
AST: 40 U/L — ABNORMAL HIGH (ref 0–37)
Albumin: 4.6 g/dL (ref 3.5–5.2)
Alkaline Phosphatase: 91 U/L (ref 39–117)
BUN: 9 mg/dL (ref 6–23)
CO2: 27 mEq/L (ref 19–32)
Calcium: 9.9 mg/dL (ref 8.4–10.5)
Chloride: 104 mEq/L (ref 96–112)
Creat: 0.74 mg/dL (ref 0.50–1.10)
Glucose, Bld: 81 mg/dL (ref 70–99)
Potassium: 3.9 mEq/L (ref 3.5–5.3)
Sodium: 138 mEq/L (ref 135–145)
Total Bilirubin: 0.4 mg/dL (ref 0.3–1.2)
Total Protein: 7.9 g/dL (ref 6.0–8.3)

## 2012-03-27 LAB — VITAMIN B12: Vitamin B-12: 477 pg/mL (ref 211–911)

## 2012-03-27 LAB — TSH: TSH: 2.165 u[IU]/mL (ref 0.350–4.500)

## 2012-03-28 ENCOUNTER — Other Ambulatory Visit: Payer: Self-pay | Admitting: Physician Assistant

## 2012-03-28 LAB — ANA: Anti Nuclear Antibody(ANA): NEGATIVE

## 2012-03-29 ENCOUNTER — Encounter (HOSPITAL_COMMUNITY): Payer: Self-pay

## 2012-03-29 ENCOUNTER — Encounter (HOSPITAL_COMMUNITY)
Admission: RE | Admit: 2012-03-29 | Discharge: 2012-03-29 | Disposition: A | Discharge status: Home / Self Care | Payer: BC Managed Care – PPO | Source: Ambulatory Visit | Attending: Urology | Admitting: Urology

## 2012-03-29 DIAGNOSIS — R350 Frequency of micturition: Secondary | ICD-10-CM | POA: Insufficient documentation

## 2012-03-29 DIAGNOSIS — R3915 Urgency of urination: Secondary | ICD-10-CM | POA: Insufficient documentation

## 2012-03-29 DIAGNOSIS — R300 Dysuria: Secondary | ICD-10-CM | POA: Insufficient documentation

## 2012-03-29 DIAGNOSIS — N135 Crossing vessel and stricture of ureter without hydronephrosis: Secondary | ICD-10-CM | POA: Insufficient documentation

## 2012-03-29 MED ORDER — TECHNETIUM TC 99M MERTIATIDE
14.8000 | Freq: Once | INTRAVENOUS | Status: AC | PRN
  Administered 2012-03-29: 14.8 via INTRAVENOUS

## 2012-03-29 MED ORDER — FUROSEMIDE 10 MG/ML IJ SOLN
40.0000 mg | Freq: Once | INTRAMUSCULAR | Status: AC
  Administered 2012-03-29: 36 mg via INTRAVENOUS
  Filled 2012-03-29: qty 4

## 2012-03-30 ENCOUNTER — Other Ambulatory Visit: Payer: Self-pay

## 2012-03-30 MED ORDER — PAROXETINE HCL 20 MG PO TABS: 20.0000 mg | ORAL_TABLET | ORAL | Status: DC

## 2012-04-01 ENCOUNTER — Telehealth: Payer: Self-pay

## 2012-04-01 NOTE — Telephone Encounter (Signed)
Pt called needs Name Brand prescription for Paxel. Pt can not take generic. Please let pharmacy know. Pt sees Dr. Elbert Ewings. [facility] [address]  Pt phone [phone]

## 2012-04-02 NOTE — Telephone Encounter (Signed)
Please see refill from 03/30/12. I have already instructed the pharmacy to fill brand name only.

## 2012-04-25 HISTORY — PX: CYSTOSCOPY: SUR368

## 2012-05-28 ENCOUNTER — Encounter: Payer: Self-pay | Admitting: Family Medicine

## 2012-05-28 DIAGNOSIS — N1330 Unspecified hydronephrosis: Secondary | ICD-10-CM

## 2012-05-28 DIAGNOSIS — N393 Stress incontinence (female) (male): Secondary | ICD-10-CM | POA: Insufficient documentation

## 2012-07-21 ENCOUNTER — Ambulatory Visit: Payer: BC Managed Care – PPO | Admitting: Gastroenterology

## 2012-07-27 ENCOUNTER — Ambulatory Visit (INDEPENDENT_AMBULATORY_CARE_PROVIDER_SITE_OTHER): Payer: BC Managed Care – PPO | Admitting: Family Medicine

## 2012-07-27 ENCOUNTER — Encounter: Payer: Self-pay | Admitting: Family Medicine

## 2012-07-27 VITALS — BP 124/62 | HR 63 | Temp 98.9°F | Resp 16 | Ht 63.0 in | Wt 156.4 lb

## 2012-07-27 DIAGNOSIS — R319 Hematuria, unspecified: Secondary | ICD-10-CM

## 2012-07-27 DIAGNOSIS — M5431 Sciatica, right side: Secondary | ICD-10-CM

## 2012-07-27 DIAGNOSIS — Z23 Encounter for immunization: Secondary | ICD-10-CM

## 2012-07-27 DIAGNOSIS — A609 Anogenital herpesviral infection, unspecified: Secondary | ICD-10-CM

## 2012-07-27 DIAGNOSIS — M5430 Sciatica, unspecified side: Secondary | ICD-10-CM

## 2012-07-27 LAB — POCT URINALYSIS DIPSTICK
Bilirubin, UA: NEGATIVE
Glucose, UA: NEGATIVE
Ketones, UA: NEGATIVE
Leukocytes, UA: NEGATIVE
Nitrite, UA: NEGATIVE
Protein, UA: NEGATIVE
Spec Grav, UA: 1.025
Urobilinogen, UA: 0.2
pH, UA: 6

## 2012-07-27 LAB — POCT UA - MICROSCOPIC ONLY
Bacteria, U Microscopic: NEGATIVE
Casts, Ur, LPF, POC: NEGATIVE
Crystals, Ur, HPF, POC: NEGATIVE
Mucus, UA: NEGATIVE
WBC, Ur, HPF, POC: NEGATIVE
Yeast, UA: NEGATIVE

## 2012-07-27 MED ORDER — VALACYCLOVIR HCL 500 MG PO TABS: 500.0000 mg | ORAL_TABLET | Freq: Two times a day (BID) | ORAL | Status: DC | PRN

## 2012-07-27 MED ORDER — GABAPENTIN 300 MG PO CAPS: 300.0000 mg | ORAL_CAPSULE | Freq: Three times a day (TID) | ORAL | Status: DC

## 2012-07-27 NOTE — Progress Notes (Signed)
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  Some improvement with motrin.  Feels poppling in back chronically when moving side to side  Urination:  Has had urology appointment who diagnosed some stress incontinence and recommended urethral stent Stool elimination:  Some IBS, fluctuating behind diarrhea and constipation. Cares for 77 yo mother in law  Obj:  Obviously uncomfortable. SLR positive bilaterally reproducing the pain No CVAT No pain with hip internal/rotation  Abdomen:  Soft, nontender, without groin adenopathy Results for orders placed in visit on 07/27/12  POCT UA - MICROSCOPIC ONLY      Component Value Range   WBC, Ur, HPF, POC neg     RBC, urine, microscopic 0-5     Bacteria, U Microscopic neg     Mucus, UA neg     Epithelial cells, urine per micros 0-2     Crystals, Ur, HPF, POC neg     Casts, Ur, LPF, POC neg     Yeast, UA neg    POCT URINALYSIS DIPSTICK      Component Value Range   Color, UA yellow     Clarity, UA clear     Glucose, UA neg     Bilirubin, UA neg     Ketones, UA neg     Spec Grav, UA 1.025     Blood, UA moderate     pH, UA 6.0     Protein, UA neg     Urobilinogen, UA 0.2     Nitrite, UA neg     Leukocytes, UA Negative       1. Need for prophylactic vaccination and inoculation against influenza  Flu vaccine greater than or equal to 3yo preservative free IM  2. HSV (herpes simplex virus) anogenital infection  valACYclovir (VALTREX) 500 MG tablet  3. Sciatica of right side  POCT UA - Microscopic Only, POCT urinalysis dipstick, gabapentin (NEURONTIN) 300 MG capsule   Assessment:  Unexplained microscopic hematuria, sciatic type symptoms.  Plan:  Continue the gabapentin (may need prednisone depending on the MRI) Urology has been involved in the past with full workup for incontinence.   Will ask patient to follow up there, as well, within next month

## 2012-08-04 ENCOUNTER — Other Ambulatory Visit: Payer: BC Managed Care – PPO

## 2012-08-09 ENCOUNTER — Other Ambulatory Visit: Payer: BC Managed Care – PPO

## 2012-08-23 ENCOUNTER — Ambulatory Visit (INDEPENDENT_AMBULATORY_CARE_PROVIDER_SITE_OTHER): Payer: BC Managed Care – PPO | Admitting: Family Medicine

## 2012-08-23 ENCOUNTER — Encounter: Payer: Self-pay | Admitting: Family Medicine

## 2012-08-23 VITALS — BP 110/70 | HR 55 | Temp 98.4°F | Resp 16 | Ht 63.0 in | Wt 158.0 lb

## 2012-08-23 DIAGNOSIS — G894 Chronic pain syndrome: Secondary | ICD-10-CM

## 2012-08-23 DIAGNOSIS — M5136 Other intervertebral disc degeneration, lumbar region: Secondary | ICD-10-CM

## 2012-08-23 DIAGNOSIS — E7800 Pure hypercholesterolemia, unspecified: Secondary | ICD-10-CM

## 2012-08-23 DIAGNOSIS — G629 Polyneuropathy, unspecified: Secondary | ICD-10-CM

## 2012-08-23 DIAGNOSIS — M5137 Other intervertebral disc degeneration, lumbosacral region: Secondary | ICD-10-CM

## 2012-08-23 LAB — LIPID PANEL
Cholesterol: 248 mg/dL — ABNORMAL HIGH (ref 0–200)
HDL: 77 mg/dL (ref >39)
Triglycerides: 114 mg/dL (ref <150)

## 2012-08-23 MED ORDER — TRAZODONE HCL 100 MG PO TABS: 150.0000 mg | ORAL_TABLET | Freq: Every day | ORAL | Status: DC

## 2012-08-23 NOTE — Patient Instructions (Addendum)
Degenerative Disk Disease Degenerative disk disease is a condition caused by the changes that occur in the cushions of the backbone (spinal disks) as you grow older. Spinal disks are soft and compressible disks located between the bones of the spine (vertebrae). They act like shock absorbers. Degenerative disk disease can affect the whole spine. However, the neck and lower back are most commonly affected. Many changes can occur in the spinal disks with aging, such as:  The spinal disks may dry and shrink.  Small tears may occur in the tough, outer covering of the disk (annulus).  The disk space may become smaller due to loss of water.  Abnormal growths in the bone (spurs) may occur. This can put pressure on the nerve roots exiting the spinal canal, causing pain.  The spinal canal may become narrowed. CAUSES  Degenerative disk disease is a condition caused by the changes that occur in the spinal disks with aging. The exact cause is not known, but there is a genetic basis for many patients. Degenerative changes can occur due to loss of fluid in the disk. This makes the disk thinner and reduces the space between the backbones. Small cracks can develop in the outer layer of the disk. This can lead to the breakdown of the disk. You are more likely to get degenerative disk disease if you are overweight. Smoking cigarettes and doing heavy work such as weightlifting can also increase your risk of this condition. Degenerative changes can start after a sudden injury. Growth of bone spurs can compress the nerve roots and cause pain.  SYMPTOMS  The symptoms vary from person to person. Some people may have no pain, while others have severe pain. The pain may be so severe that it can limit your activities. The location of the pain depends on the part of your backbone that is affected. You will have neck or arm pain if a disk in the neck area is affected. You will have pain in your back, buttocks, or legs if a disk  in the lower back is affected. The pain becomes worse while bending, reaching up, or with twisting movements. The pain may start gradually and then get worse as time passes. It may also start after a major or minor injury. You may feel numbness or tingling in the arms or legs.  DIAGNOSIS  Your caregiver will ask you about your symptoms and about activities or habits that may cause the pain. He or she may also ask about any injuries, diseases, or treatments you have had earlier. Your caregiver will examine you to check for the range of movement that is possible in the affected area, to check for strength in your extremities, and to check for sensation in the areas of the arms and legs supplied by different nerve roots. An X-ray of the spine may be taken. Your caregiver may suggest other imaging tests, such as magnetic resonance imaging (MRI), if needed.  TREATMENT  Treatment includes rest, modifying your activities, and applying ice and heat. Your caregiver may prescribe medicines to reduce your pain and may ask you to do some exercises to strengthen your back. In some cases, you may need surgery. You and your caregiver will decide on the treatment that is best for you. HOME CARE INSTRUCTIONS   Follow proper lifting and walking techniques as advised by your caregiver.  Maintain good posture.  Exercise regularly as advised.  Perform relaxation exercises.  Change your sitting, standing, and sleeping habits as advised. Change positions   frequently.  Lose weight as advised.  Stop smoking if you smoke.  Wear supportive footwear. SEEK MEDICAL CARE IF:  Your pain does not go away within 1 to 4 weeks. SEEK IMMEDIATE MEDICAL CARE IF:   Your pain is severe.  You notice weakness in your arms, hands, or legs.  You begin to lose control of your bladder or bowel movements. MAKE SURE YOU:   Understand these instructions.  Will watch your condition.  Will get help right away if you are not doing  well or get worse. Document Released: 04/18/2007 Document Revised: 09/13/2011 Document Reviewed: 04/18/2007 [facility] Patient Information 2013 [place], [place].   To help with sleep, I have increased Trazodone to 150 mg at bedtime. Your tablet dose will still be 100 mg; take 1 1/2 tablets every night.

## 2012-08-23 NOTE — Progress Notes (Signed)
S: Pt is 62 y.o. AA female reports chronic LBP for > 10 years. Within last month, pain has been intolerable and intractable. The pain is located in lower back in posterior R hip and radiates around to R groin area. Gabapentin taken 3 times /day allows pt to function and perform ADLs. Sleep is interrupted by pain; pt cannot lay on R side and she has problems turning over in bed unassisted. Pt can stand no longer than 15 minutes; she has intense pain in lower back and R hip.  With prolonged sitting, she has difficulty getting up and she has tingling in R leg and foot. Pt has paperwork indicating that she had NCV study at [facility] in March 2013 (results not with papers).  Pt also reports episode of complete numbness of R arm last week, lasting > 20 minutes. Pain started when feeling returned to arm. Pt has crepitus in neck w/ some stiffness; this has been a problem for years.  ROS: As per HPI; negative for fever/ chills, appetite change, Cp or tightness, palpitations, SOB or DOE, saddle anesthesia or incontinence of bladder or bowel.   O:  Filed Vitals:   08/23/12 1020  BP: 110/70  Pulse: 55  Temp: 98.4 F (36.9 C)  Resp: 16   GEN: In mild distress; sitting uncomfortably on exam table (had to move to chair w/ cushion). HENT: [place]/AT; WN,WD. COR: RRR. LUNGS: Normal resp rate and effort. BACK: Significant muscle spasms in lumbosacral region. Tender R SI joint area. Forward flexion limited to 30-45 degrees. Pt cannot stand R leg. NEURO: A&O x 3; CNs intact.  Pt cannot walk on toes or heels. Gait is abnormal. DTRs- 2+/= in UEs.; R patellar- 0-1+ and L patellar 1-2+.  A/P:  DDD (degenerative disc disease), lumbar - Last MRI performed in 2010, showing DDD.  Plan: MR Lumbar Spine Wo Contrast  Chronic pain syndrome - pt has intractable pain.   Plan: MR Lumbar Spine Wo Contrast  Hypercholesteremia - pt taking Rosuvastatin 10 mg daily.   Plan: Lipid panel, ALT  Peripheral neuropathy -  Due to Lumbar  DDD; Increase Trazodone to 150 mg hs for pain relief and to improve sleep hygiene.  Plan: traZODone (DESYREL) 100 MG tablet                                         Continue Gabapentin 300 mg AM and at midday and 1-2 capsules hs  Meds ordered this encounter  Medications  . traZODone (DESYREL) 100 MG tablet    Sig: Take 1.5 tablets (150 mg total) by mouth at bedtime.    Dispense:  45 tablet    Refill:  3

## 2012-08-24 ENCOUNTER — Encounter: Payer: Self-pay | Admitting: Family Medicine

## 2012-08-24 DIAGNOSIS — M810 Age-related osteoporosis without current pathological fracture: Secondary | ICD-10-CM | POA: Insufficient documentation

## 2012-08-26 NOTE — Progress Notes (Signed)
Quick Note:  Please contact pt and advise that the following labs are abnormal... Your recent lipid panel shows elevated total cholesterol and LDL ("bad"); HDL ("good") cholesterol is very high which is very good. As we discussed at your visit, because tour HDL is so high, your risk of heart disease is about 1/2 average risk. Your can continue on the current dose of Crestor (generic) and we can check these numbers again in ~ 6 months; I do not want to increase the medication dose because of concerns about increasing potential side effects. Liver test Is normal.   Copy to pt. ______

## 2012-08-31 ENCOUNTER — Other Ambulatory Visit: Payer: Self-pay | Admitting: Family Medicine

## 2012-08-31 ENCOUNTER — Telehealth: Payer: Self-pay | Admitting: *Deleted

## 2012-08-31 DIAGNOSIS — M5137 Other intervertebral disc degeneration, lumbosacral region: Secondary | ICD-10-CM

## 2012-08-31 NOTE — Telephone Encounter (Signed)
Called patient to advise her [employer] denied the MRI of lumbar spine. Dr Audria Nine wants to try physical therapy again at [facility] for additional treatment. After the PT she will request another MRI of lumbar spine and hope the  insurance company will make an approval.

## 2012-09-05 ENCOUNTER — Telehealth: Payer: Self-pay

## 2012-09-05 NOTE — Telephone Encounter (Signed)
Pt would like to be referred to [facility] please call patient with questions

## 2012-10-19 ENCOUNTER — Ambulatory Visit (INDEPENDENT_AMBULATORY_CARE_PROVIDER_SITE_OTHER): Payer: BC Managed Care – PPO | Admitting: Family Medicine

## 2012-10-19 ENCOUNTER — Encounter: Payer: Self-pay | Admitting: Family Medicine

## 2012-10-19 VITALS — BP 128/74 | HR 60 | Temp 98.5°F | Resp 16 | Ht 63.0 in | Wt 160.0 lb

## 2012-10-19 DIAGNOSIS — Z636 Dependent relative needing care at home: Secondary | ICD-10-CM

## 2012-10-19 DIAGNOSIS — M5137 Other intervertebral disc degeneration, lumbosacral region: Secondary | ICD-10-CM

## 2012-10-19 DIAGNOSIS — M25539 Pain in unspecified wrist: Secondary | ICD-10-CM

## 2012-10-19 DIAGNOSIS — Z6379 Other stressful life events affecting family and household: Secondary | ICD-10-CM

## 2012-10-19 DIAGNOSIS — T887XXA Unspecified adverse effect of drug or medicament, initial encounter: Secondary | ICD-10-CM

## 2012-10-19 DIAGNOSIS — M25532 Pain in left wrist: Secondary | ICD-10-CM

## 2012-10-19 DIAGNOSIS — M5136 Other intervertebral disc degeneration, lumbar region: Secondary | ICD-10-CM

## 2012-10-19 MED ORDER — DICLOFENAC SODIUM 75 MG PO TBEC: 75.0000 mg | DELAYED_RELEASE_TABLET | Freq: Two times a day (BID) | ORAL | Status: DC

## 2012-10-19 NOTE — Patient Instructions (Addendum)
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 You may want to contact an agency called [employer] who can give you some direction about what resources are available to assist you..   For any joint aches and pains--get Arnica GEL and apply it 2 or 3 times a day to painful areas. I have prescribed an arthritic medication (Diclofenac) to be taken twice a day with food.

## 2012-10-23 DIAGNOSIS — Z636 Dependent relative needing care at home: Secondary | ICD-10-CM | POA: Insufficient documentation

## 2012-10-23 NOTE — Progress Notes (Signed)
S:  This 62 y.o. AA female returns for follow-up of DDD; she has been attending PT at [facility] practice Luan Pulling, PT) for 6 visits. She reports some improvement and does solidify progress w/ home exercises. Two t- four more sessions are recommended. Pt denies radicular pain in leg but does have persistent hip and mid-back pain. Sleep is interrupted by pain; pt has difficulty finding a comfortable position. Another problem has developed- L wrist pain attributed to having to push self up into sitting position when arising in the mornings. Wrist has not been swollen or red, just tender along wrist bones and with extremes of ROM.  Re: Medications- pt reports "soreness" with Zetia and Rosuvastatin; she stopped both medications and soreness subsided. Pt no longer taking Trazodone due to adverse effects (sleep disruption due to strange dreams).   PMHx, Soc Hx and Fam Hx reviewed.  ROS: As per HPI.  O:  Filed Vitals:   10/19/12 1523  BP: 128/74  Pulse: 60  Temp: 98.5 F (36.9 C)  Resp: 16   GEN: In NAD; WN,WD. HENT: [place]/AT; EOMI w/ clear conj/ sclerae. EACs/nose normal. Oroph clear and moist. NECK: Supple. No TMG. COR: RRR; no edema. LUNGS: Normal resp rate and effort. MS: L wrist- no deformity or erythema or swelling; tender radius and discomfort w/ extreme extension.  BACK: Spine is straight; tender midline lumbosacral spine and R SI joint. NEURO: A&O x 3; CNs intact. Gait is slow and antalgic, favoring R leg.  A/P: DDD (degenerative disc disease), lumbar- continue PT.  Wrist pain, left- discuss w/ PT; try  To avoid weight bearing when getting up in the morning. Use topical analgesic.  Unspecified adverse effect of unspecified drug, medicinal and biological substance- Zetia, Rosuvastatin and Trazodone discontinued.  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 She has reached a point where she realizes that she can no longer perform the duties required to provide total care to her family member. Also, her husband has cancer and she needs to reserve her strength and physical capabilities for him in the future. She will discuss the need for alternative care for mother-in-law w/ her husband's family. I shared some resources available in the community that may be helpful to the family.  Total Face-to-Face time with pt= 45 minutes.

## 2012-10-30 ENCOUNTER — Telehealth: Payer: Self-pay

## 2012-10-30 NOTE — Telephone Encounter (Signed)
Dr. Audria Nine rx'd diclofenac but pt had to stop taking as it made her sick to her stomach. (she is a Dr. Elbert Ewings patient) Would like something else called in, preferably not prednisone [facility] [phone]  Medical records--pt states she brought in her records from Dr. Melanee Spry office but she was told we have misplaced them?

## 2012-10-30 NOTE — Telephone Encounter (Signed)
If records were brought in, then they were sent to be scanned if they provider indicated to scan them.

## 2012-10-31 ENCOUNTER — Other Ambulatory Visit: Payer: Self-pay | Admitting: Family Medicine

## 2012-10-31 ENCOUNTER — Telehealth: Payer: Self-pay

## 2012-10-31 MED ORDER — MELOXICAM 7.5 MG PO TABS: 7.5000 mg | ORAL_TABLET | Freq: Every day | ORAL | Status: DC

## 2012-10-31 NOTE — Telephone Encounter (Signed)
Spoke to her about records also she states she brought these in, from Dr Lorenz Coaster can you call her about this, she is asking where they are, I can not help her.

## 2012-10-31 NOTE — Telephone Encounter (Signed)
Pt states that she has been trying for several weeks to get in touch with someone in Medical Records She is wanting someone to give her a call back today if possible Call back number is [phone]

## 2012-10-31 NOTE — Telephone Encounter (Signed)
I ordered meloxicam 7.5 mg daily at [facility].  If not better, come in to be seen.

## 2012-10-31 NOTE — Telephone Encounter (Signed)
-----   Message from Lenell Antu, DO sent at 10/30/2012 6:14 PM ----- She is not my patient. Please ask Dr. Elbert Ewings. I would rx her something but she is on Paxil and I don't know her mental status if I were to try tramadol.  Patient requesting meds, states diclofenac caused GI upset so she d/c this.

## 2012-10-31 NOTE — Telephone Encounter (Signed)
Called her to advise.  

## 2012-11-01 NOTE — Telephone Encounter (Signed)
Patient has called before in the past with the same issue; this is what was told to her- If records were brought in, then they were sent to be scanned if they provider indicated to scan them. No records that I have seen been brought in by outside office.

## 2012-12-14 ENCOUNTER — Telehealth: Payer: Self-pay | Admitting: Cardiovascular Disease

## 2012-12-14 NOTE — Telephone Encounter (Signed)
Tami [name] is asking for a letter faxed saying that it is ok to go back to physical therapy.... Please call    Thanks

## 2012-12-14 NOTE — Telephone Encounter (Signed)
Message forwarded to J.C. Wildman, LPN.  

## 2012-12-15 ENCOUNTER — Other Ambulatory Visit: Payer: Self-pay | Admitting: *Deleted

## 2012-12-15 MED ORDER — METOPROLOL SUCCINATE ER 50 MG PO TB24: 50.0000 mg | ORAL_TABLET | Freq: Every day | ORAL | Status: DC

## 2012-12-15 NOTE — Telephone Encounter (Signed)
Al Corpus to [facility] pharmacy

## 2012-12-16 NOTE — Telephone Encounter (Signed)
Per Dr. Alanda Amass it is ok for pt. To return to physical therapy. A letter has been mailed to the pt. As such.

## 2012-12-21 ENCOUNTER — Encounter: Payer: Self-pay | Admitting: Family Medicine

## 2012-12-21 ENCOUNTER — Ambulatory Visit (INDEPENDENT_AMBULATORY_CARE_PROVIDER_SITE_OTHER): Payer: BC Managed Care – PPO | Admitting: Family Medicine

## 2012-12-21 ENCOUNTER — Ambulatory Visit: Payer: BC Managed Care – PPO

## 2012-12-21 VITALS — BP 122/62 | HR 56 | Temp 99.0°F | Resp 16 | Ht 63.5 in | Wt 156.0 lb

## 2012-12-21 DIAGNOSIS — Z6379 Other stressful life events affecting family and household: Secondary | ICD-10-CM

## 2012-12-21 DIAGNOSIS — M25539 Pain in unspecified wrist: Secondary | ICD-10-CM

## 2012-12-21 DIAGNOSIS — M25532 Pain in left wrist: Secondary | ICD-10-CM

## 2012-12-21 DIAGNOSIS — Z636 Dependent relative needing care at home: Secondary | ICD-10-CM

## 2012-12-21 NOTE — Patient Instructions (Signed)
I have ordered a referral to a hand specialist at [facility]. They may need to do additional imaging as part of your evaluation. Continue to use the topical pain medication and limit use of that extremity as you have been doing.

## 2012-12-21 NOTE — Progress Notes (Signed)
S: This 62 y.o. AA female returns for follow-up of L wrist pain. She has curtailed her demanding physical activities related to care of her mother-in-law. At last visit, we discussed the caregiver stress that she was experiencing. Pt could not find any agencies located in her community that could come into the home (they live in [place]). Other family members are helping out; she does sit w/ her mother-in-law but avoids any duties that involve lifting, pulling, pushing, etc.  Her wrist pain has subsided but is now a nagging ache; the wrist bone seems to be more prominent and swollen compared to the R wrist. ROM of L wrist is reduced. Oral medication provided temporary relief. Topical agent was more helpful. Pt does not want to take any oral NSAIDs.  Pt recalls care with a Hand Specialist ~ 15 years at [facility]. She would like to referred back to that facility.  PMHx, Soc Hx, Fam Hx and Problem List reviewed.  ROS: As per HPI; negative for fever, fatigue; abnormal weight loss, rashes, myalgias or weakness.   O: Filed Vitals:   12/21/12 1139  BP: 122/62  Pulse: 56  Temp: 99 F (37.2 C)  Resp: 16   GEN: In NAD; WN,WD. COR: RRR. LUNGS: Normal resp rate and effort. MS: L wrist- prominent radial head with tenderness; no redness or dislocation. Pincer movement produces pain. Flexion/extension at wrist is normal. No tenderness over forearm.  NEURO: A&O x 3; CNs intact. Nonfocal.   [facility] reading (PRIMARY) by  Dr. Audria Nine: L wrist- no fracture or dislocation.  A/P: Wrist pain, left - Plan: Continue topical analgesic and conservative treatment. Avoid stress across L wrist (no lifting, pushing or pulling or putting weight on wrist).                                       Ambulatory referral to Hand Surgery   Caregiver stress- pt has successfully found a way to limit her activities so that she does not aggravate her physical problems. The help of other family members has helped  tremendously.

## 2012-12-27 ENCOUNTER — Telehealth: Payer: Self-pay

## 2012-12-27 NOTE — Telephone Encounter (Signed)
Request given to xray 

## 2012-12-27 NOTE — Telephone Encounter (Signed)
Patient needs her latest x-ray to take to an appointment she has tomorrow.   [phone]  Is the number to call when the disc is ready for pick up.

## 2013-01-04 ENCOUNTER — Ambulatory Visit (INDEPENDENT_AMBULATORY_CARE_PROVIDER_SITE_OTHER): Payer: BC Managed Care – PPO | Admitting: Family Medicine

## 2013-01-04 ENCOUNTER — Ambulatory Visit: Payer: BC Managed Care – PPO

## 2013-01-04 ENCOUNTER — Encounter: Payer: Self-pay | Admitting: Family Medicine

## 2013-01-04 VITALS — BP 126/66 | HR 58 | Temp 98.2°F | Resp 16 | Ht 64.0 in | Wt 156.4 lb

## 2013-01-04 DIAGNOSIS — M545 Low back pain, unspecified: Secondary | ICD-10-CM

## 2013-01-04 DIAGNOSIS — G8929 Other chronic pain: Secondary | ICD-10-CM

## 2013-01-04 DIAGNOSIS — I4891 Unspecified atrial fibrillation: Secondary | ICD-10-CM

## 2013-01-04 DIAGNOSIS — M25559 Pain in unspecified hip: Secondary | ICD-10-CM

## 2013-01-04 DIAGNOSIS — I10 Essential (primary) hypertension: Secondary | ICD-10-CM

## 2013-01-04 DIAGNOSIS — M654 Radial styloid tenosynovitis [de Quervain]: Secondary | ICD-10-CM

## 2013-01-04 MED ORDER — FLECAINIDE ACETATE 100 MG PO TABS: 50.0000 mg | ORAL_TABLET | Freq: Two times a day (BID) | ORAL | Status: DC

## 2013-01-04 MED ORDER — METOPROLOL SUCCINATE ER 50 MG PO TB24: 50.0000 mg | ORAL_TABLET | Freq: Every day | ORAL | Status: DC

## 2013-01-04 NOTE — Progress Notes (Addendum)
62 yo with low back and right hip pain chronically.  Seeing  orthopedics and needing pain pills.  She alternates motrin and tylenol.  Physical therapy [facility] Kicks)  has not helped.  Leery of shots into the back or hip.  Onset 40 years ago, worsening and hip now gives out on her.  She recently had a fall and the hip and back pain have worsened.  Retired Midwife  She also has left radial wrist pains, shooting.  Objective:  NAD Tender left radial wrist c/w deQuervain's Lumbar beltline is tender, worse on the right. SLR:  Very tentative Right hip ROM is very painful, particularly with external rotation Right hip nontender. [facility] reading (PRIMARY) by  Dr. Milus Glazier:  Normal right hip.   Assessment:  Chronic right low back and hip pain, worsening.  DeQuervain's synovitis.  Patient is resistant to injection or surgery and would entertain one of these only as a last resort.  Plan:   Low back pain - Plan: MR Lumbar Spine Wo Contrast  De Quervain's tenosynovitis, left  Hip pain, chronic, right - Plan: DG Hip Complete Right, MR Hip Right W Wo Contrast  Offered injection to wrist and/or referral to Dr. Darrick Penna.  We will proceed with MRI for back and hip problems.  If surgical problem, then [facility] referral.  If not surgical, Dr. Darrick Penna.  Signed, Elvina Sidle, MD

## 2013-01-10 ENCOUNTER — Other Ambulatory Visit: Payer: Self-pay | Admitting: Family Medicine

## 2013-01-10 DIAGNOSIS — M654 Radial styloid tenosynovitis [de Quervain]: Secondary | ICD-10-CM

## 2013-01-13 ENCOUNTER — Ambulatory Visit (INDEPENDENT_AMBULATORY_CARE_PROVIDER_SITE_OTHER): Payer: BC Managed Care – PPO | Admitting: Family Medicine

## 2013-01-13 VITALS — BP 130/72 | HR 80 | Temp 98.2°F | Resp 18 | Wt 156.0 lb

## 2013-01-13 DIAGNOSIS — G8929 Other chronic pain: Secondary | ICD-10-CM

## 2013-01-13 DIAGNOSIS — M654 Radial styloid tenosynovitis [de Quervain]: Secondary | ICD-10-CM

## 2013-01-13 DIAGNOSIS — M25559 Pain in unspecified hip: Secondary | ICD-10-CM

## 2013-01-13 DIAGNOSIS — M549 Dorsalgia, unspecified: Secondary | ICD-10-CM

## 2013-01-13 DIAGNOSIS — M5431 Sciatica, right side: Secondary | ICD-10-CM

## 2013-01-13 DIAGNOSIS — M5430 Sciatica, unspecified side: Secondary | ICD-10-CM

## 2013-01-13 MED ORDER — METHYLPREDNISOLONE ACETATE 80 MG/ML IJ SUSP
80.0000 mg | Freq: Once | INTRAMUSCULAR | Status: AC
  Administered 2013-01-13: 80 mg via INTRA_ARTICULAR

## 2013-01-13 NOTE — Progress Notes (Signed)
62 yo with left wrist pain and chronic low back pain  Finished PT June 20th.  Pain continues and her leg has given out on her.  She has right sciatic and hip pains as well.  She fell last fall since her last MRI.

## 2013-01-13 NOTE — Progress Notes (Signed)
62-year-old woman who comes in for injection of her left wrist. She also would like to review her back issues because she was denied an MRI previously this year.  She hasn't gotten any better with regarding the left wrist pain which is persisted for over month (please see last note) she has tenderness over the anatomical snuffbox on the left and has pain with making a clenched fist.  The back pain has been persistent for over year. She had a fall after her last MRI at which time we tried to get an MRI and this was denied by her insurance company. Over the last 6 months she's had her leg gave out on her several times, has chronic right hip pain which becomes quite severe at times, and has undergone physical therapy for over 4 weeks which ended June 20 with no improvement.  Objective: No acute distress, very pleasant woman with good eye contact and appropriate affect Examination of the left wrist reveals tenderness over the anatomical snuff box, full range of motion, no swelling. She hasn't mild diffuse tenderness over the extensor pollicis longus.  After careful alcohol prep, the area was sprayed with ethyl chloride and then injected with Depo-Medrol and Marcaine. Patient had significant relief from her pain while in the office.  Patient shows positive straight leg raising on the right. She is having a slightly antalgic gait favoring the left leg.  Assessment: Chronic back pain which needs further evaluation, [place]. cellulitis which should resolve after the injection today.  Back pain - Plan: Ambulatory referral to Orthopedic Surgery  Sciatica neuralgia, right - Plan: Ambulatory referral to Orthopedic Surgery  Hip pain, chronic, right - Plan: Ambulatory referral to Orthopedic Surgery  De Quervain's tenosynovitis, left - Plan: methylPREDNISolone acetate (DEPO-MEDROL) injection 80 mg  Signed, Elvina Sidle, MD

## 2013-01-24 ENCOUNTER — Other Ambulatory Visit: Payer: Self-pay | Admitting: Orthopedic Surgery

## 2013-01-24 DIAGNOSIS — M545 Low back pain: Secondary | ICD-10-CM

## 2013-01-28 ENCOUNTER — Ambulatory Visit
Admission: RE | Admit: 2013-01-28 | Discharge: 2013-01-28 | Disposition: A | Discharge status: Home / Self Care | Payer: BC Managed Care – PPO | Source: Ambulatory Visit | Attending: Orthopedic Surgery | Admitting: Orthopedic Surgery

## 2013-01-28 DIAGNOSIS — M545 Low back pain: Secondary | ICD-10-CM

## 2013-02-06 ENCOUNTER — Other Ambulatory Visit: Payer: Self-pay | Admitting: Family Medicine

## 2013-02-09 ENCOUNTER — Other Ambulatory Visit (HOSPITAL_COMMUNITY): Payer: Self-pay | Admitting: Orthopedic Surgery

## 2013-02-09 DIAGNOSIS — M810 Age-related osteoporosis without current pathological fracture: Secondary | ICD-10-CM

## 2013-02-15 ENCOUNTER — Ambulatory Visit (HOSPITAL_COMMUNITY): Payer: BC Managed Care – PPO

## 2013-03-01 ENCOUNTER — Ambulatory Visit (INDEPENDENT_AMBULATORY_CARE_PROVIDER_SITE_OTHER): Payer: BC Managed Care – PPO | Admitting: Family Medicine

## 2013-03-01 ENCOUNTER — Encounter: Payer: Self-pay | Admitting: Family Medicine

## 2013-03-01 VITALS — BP 122/66 | HR 59 | Temp 98.4°F | Resp 16 | Ht 64.25 in | Wt 157.2 lb

## 2013-03-01 DIAGNOSIS — M549 Dorsalgia, unspecified: Secondary | ICD-10-CM

## 2013-03-01 DIAGNOSIS — R109 Unspecified abdominal pain: Secondary | ICD-10-CM

## 2013-03-01 DIAGNOSIS — N393 Stress incontinence (female) (male): Secondary | ICD-10-CM

## 2013-03-01 LAB — POCT URINALYSIS DIPSTICK
Bilirubin, UA: NEGATIVE
Glucose, UA: NEGATIVE
Ketones, UA: NEGATIVE
Leukocytes, UA: NEGATIVE
Nitrite, UA: NEGATIVE
Protein, UA: NEGATIVE
Spec Grav, UA: 1.02
Urobilinogen, UA: 0.2
pH, UA: 7

## 2013-03-01 LAB — POCT UA - MICROSCOPIC ONLY
Bacteria, U Microscopic: NEGATIVE
Casts, Ur, LPF, POC: NEGATIVE
Crystals, Ur, HPF, POC: NEGATIVE
Mucus, UA: NEGATIVE
Yeast, UA: NEGATIVE

## 2013-03-01 MED ORDER — TRAMADOL HCL 50 MG PO TABS: 50.0000 mg | ORAL_TABLET | Freq: Three times a day (TID) | ORAL | Status: DC | PRN

## 2013-03-01 NOTE — Progress Notes (Signed)
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  She has seen Dr. Patsi Sears who recommends a mesh procedure for the bladder.  She is S/P hysterectomy.  She has stress incontinence.  Abdominal pain is worse lately with urinary urgency.  Objective:  Alert, NAD  HEENT: unremarkable Chest:  Clear Abdomen: soft nontender with no mass or HSM Back:  nontender Results for orders placed in visit on 03/01/13  POCT UA - MICROSCOPIC ONLY      Result Value Range   WBC, Ur, HPF, POC 0-1     RBC, urine, microscopic 0-6     Bacteria, U Microscopic neg     Mucus, UA neg     Epithelial cells, urine per micros 0-6     Crystals, Ur, HPF, POC neg     Casts, Ur, LPF, POC neg     Yeast, UA neg    POCT URINALYSIS DIPSTICK      Result Value Range   Color, UA yellow     Clarity, UA clear     Glucose, UA neg     Bilirubin, UA neg     Ketones, UA neg     Spec Grav, UA 1.020     Blood, UA small     pH, UA 7.0     Protein, UA neg     Urobilinogen, UA 0.2     Nitrite, UA neg     Leukocytes, UA Negative     Assessment:  Chronic incontinence.  Reluctant to have mesh procedure and is interested in possibility of sling procedure.   Also having chronic lumbar disc problems and anticipating injections by Dr. Regino Schultze.  Abdominal pain, unspecified site - Plan: POCT UA - Microscopic Only, POCT urinalysis dipstick, Ambulatory referral to Gynecology  Stress incontinence - Plan: Ambulatory referral to Gynecology  Signed, Elvina Sidle, MD

## 2013-03-08 ENCOUNTER — Ambulatory Visit (HOSPITAL_COMMUNITY)
Admission: RE | Admit: 2013-03-08 | Discharge: 2013-03-08 | Disposition: A | Discharge status: Home / Self Care | Payer: BC Managed Care – PPO | Source: Ambulatory Visit | Attending: Orthopedic Surgery | Admitting: Orthopedic Surgery

## 2013-03-08 DIAGNOSIS — Z780 Asymptomatic menopausal state: Secondary | ICD-10-CM | POA: Insufficient documentation

## 2013-03-08 DIAGNOSIS — Z13820 Encounter for screening for osteoporosis: Secondary | ICD-10-CM | POA: Insufficient documentation

## 2013-03-08 DIAGNOSIS — M810 Age-related osteoporosis without current pathological fracture: Secondary | ICD-10-CM

## 2013-04-30 ENCOUNTER — Telehealth: Payer: Self-pay | Admitting: *Deleted

## 2013-04-30 NOTE — Telephone Encounter (Signed)
Pt is calling in regards to her TENS unit. She asked if you can call her back regarding this issue bc she was told to ask her cardiologist.

## 2013-05-01 NOTE — Telephone Encounter (Signed)
Pt. Called back and no answer; message left on voicemail

## 2013-05-23 ENCOUNTER — Ambulatory Visit (INDEPENDENT_AMBULATORY_CARE_PROVIDER_SITE_OTHER): Payer: BC Managed Care – PPO | Admitting: Cardiovascular Disease

## 2013-05-23 ENCOUNTER — Encounter: Payer: Self-pay | Admitting: Cardiovascular Disease

## 2013-05-23 VITALS — BP 130/86 | HR 57 | Ht 64.0 in | Wt 158.3 lb

## 2013-05-23 DIAGNOSIS — E782 Mixed hyperlipidemia: Secondary | ICD-10-CM

## 2013-05-23 DIAGNOSIS — R200 Anesthesia of skin: Secondary | ICD-10-CM

## 2013-05-23 DIAGNOSIS — E559 Vitamin D deficiency, unspecified: Secondary | ICD-10-CM

## 2013-05-23 DIAGNOSIS — D689 Coagulation defect, unspecified: Secondary | ICD-10-CM

## 2013-05-23 DIAGNOSIS — I10 Essential (primary) hypertension: Secondary | ICD-10-CM

## 2013-05-23 DIAGNOSIS — I2510 Atherosclerotic heart disease of native coronary artery without angina pectoris: Secondary | ICD-10-CM

## 2013-05-23 DIAGNOSIS — Z23 Encounter for immunization: Secondary | ICD-10-CM

## 2013-05-23 DIAGNOSIS — R5381 Other malaise: Secondary | ICD-10-CM

## 2013-05-23 DIAGNOSIS — E785 Hyperlipidemia, unspecified: Secondary | ICD-10-CM | POA: Insufficient documentation

## 2013-05-23 DIAGNOSIS — R209 Unspecified disturbances of skin sensation: Secondary | ICD-10-CM

## 2013-05-23 DIAGNOSIS — I471 Supraventricular tachycardia: Secondary | ICD-10-CM

## 2013-05-23 DIAGNOSIS — R202 Paresthesia of skin: Secondary | ICD-10-CM

## 2013-05-23 NOTE — Patient Instructions (Signed)
Your physician has requested that you have an echocardiogram. Echocardiography is a painless test that uses sound waves to create images of your heart. It provides your doctor with information about the size and shape of your heart and how well your heart's chambers and valves are working. This procedure takes approximately one hour. There are no restrictions for this procedure. This will be scheduled in  February 2015.  Your physician recommends that you return for lab work fasting. DO NOT EAT OR DRINK AFTER MIDNIGHT THE NIGHT PRIOR TO GOING TO THE LAB. You wil not need a appointment. Most labs open at 8:00 a.m.  Your physician recommends that you schedule a follow-up appointment in: February 2015

## 2013-05-23 NOTE — Progress Notes (Signed)
Patient ID: Tami [name], female   DOB: 1951-06-03, 62 y.o.   MRN: [id number]     PATIENT PROFILE: Tami [name] is a 62 year old African American female who presents to the office today to establish Cardiologic care with me. She is a former patient of Dr. Alanda Amass and last saw him in his solo practice in May 2014.    HPI: Tami [name] has a history of palpitations, paroxysmal supraventricular tachycardia as well as paroxysmal atrial fibrillation which have been detected on remote monitoring. She has been treated with low-dose flecainide 50 mg twice a day in addition to metoprolol succinate 50 mg daily. She also has a history of hyperlipidemia in the past has had LDLs in the 145 range but apparently was intolerant to several statins including Crestor. She also states she developed some myalgias secondary to Zetia. When she was last seen by Dr. Alanda Amass he had written her a prescription for livalo 1 mg although but she never had this filled.  She also has continued problems with low back and hip discomfort and has some sciatica issues. She is followed by Dr. Turner Daniels. She also has undergone MRIs of her back.  She denies any development of chest pain. Review of Dr. Kandis Cocking records reveals that she had an echo Doppler study in February 2012 which showed normal systolic and diastolic function. She did not have significant valvular pathology. A nuclear perfusion study was done in 2010 which was normal. She presents for evaluation.  She is unaware of recent tachycardia palpitations. She denies recent chest pressure. She does have difficulty with her back and right hip. In the past as a result of her chronic pain she was started on Paxil to help with depression. She also takes Neurontin for her neuropathy.    Past Medical History  Diagnosis Date  . Heart palpitations   . Osteoporosis   . Asthma   . SVT (supraventricular tachycardia)   . Numbness of arm     Right Arm  . Anemia     as a  child  . Irregular heartbeat   . Anxiety   . Arthritis   . Atrial fibrillation   . Heartburn   . Hyperlipidemia   . Pure hypercholesterolemia     Past Surgical History  Procedure Laterality Date  . Abdominal hysterectomy  1987  . Cystoscopy  04/25/2012    Pt may be a candidate for research anti-incontinence procedure protocol  . Breast surgery      Left    Allergies  Allergen Reactions  . Clarithromycin   . Statins Other (See Comments)    Muscle soreness and weakness w/ Rosuvastatin and Zetia.  . Sulfonamide Derivatives   . Ciprofloxacin Hcl Palpitations    Current Outpatient Prescriptions  Medication Sig Dispense Refill  . aspirin 81 MG tablet Take 81 mg by mouth daily.      . flecainide (TAMBOCOR) 100 MG tablet Take 0.5 tablets (50 mg total) by mouth 2 (two) times daily.  180 tablet  2  . gabapentin (NEURONTIN) 300 MG capsule Take 1 capsule (300 mg total) by mouth 3 (three) times daily.  270 capsule  3  . metoprolol succinate (TOPROL-XL) 50 MG 24 hr tablet Take 1 tablet (50 mg total) by mouth daily. Take with or immediately following a meal.  90 tablet  3  . PARoxetine (PAXIL) 20 MG tablet Take 1 tablet (20 mg total) by mouth every morning.  90 tablet  2  . valACYclovir (VALTREX) 500 MG  tablet Take 1 tablet (500 mg total) by mouth 2 (two) times daily as needed.  60 tablet  11   No current facility-administered medications for this visit.    Social history is notable in that she is married and has 2 children and 3 grandchildren. 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 There is no tobacco or alcohol use.   Family History  Problem Relation Age of Onset  . Cancer Mother     skin cancer  . Diabetes Sister     Borderline  . Hypertension Sister   . Heart attack Sister   . Diabetes Brother     Borderline    ROS is negative for fever chills or night sweats. She denies any skin changes. She denies any changes in vision. She  is unaware of any adenopathy. She denies shortness of breath, PND orthopnea. There is no history of cough or recent increased sputum production. There is no wheezing. There is no presyncope or syncope. She denies any exertionally precipitated chest pain. She denies change in bowel or bladder habits. She denies GU symptoms; but in the past has had some difficulty with stress incontinence. 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 She is unaware of diabetes. She denies cold or heat intolerance. Other comprehensive 14 point system review is negative.  PE BP 130/86  Pulse 57  Ht 5\' 4"  (1.626 m)  Wt 158 lb 4.8 oz (71.804 kg)  BMI 27.16 kg/m2 General: Alert, oriented, no distress.  Skin: normal turgor, no rashes HEENT: Normocephalic, atraumatic. Pupils round and reactive; sclera anicteric; Fundi no hemorrhages or exudates arteriolar narrowing Nose without nasal septal hypertrophy Mouth/Parynx benign; Mallinpatti scale 3 Neck: No JVD, no carotid bruits Lungs: clear to ausculatation and percussion; no wheezing or rales Heart: RRR, s1 s2 normal 1/6 systolic murmur, no ectopy Abdomen: soft, nontender; no hepatosplenomehaly, BS+; abdominal aorta nontender and not dilated by palpation. Pulses 2+ Extremities: no clubbinbg cyanosis or edema, Homan's sign negative  Neurologic: grossly nonfocal Psychologic: Normal mood and affect    ECG: Sinus rhythm at 57 beats per minute, with first degree AV block with PR interval 252 ms. QTc interval is normal at 389 ms. Nonspecific T changes.  LABS:  BMET    Component Value Date/Time   NA 138 03/26/2012 1619   K 3.9 03/26/2012 1619   CL 104 03/26/2012 1619   CO2 27 03/26/2012 1619   GLUCOSE 81 03/26/2012 1619   BUN 9 03/26/2012 1619   CREATININE 0.74 03/26/2012 1619   CREATININE 0.83 12/25/2009 0208   CALCIUM 9.9 03/26/2012 1619   GFRNONAA >60 12/25/2009 0208   GFRAA  Value: >60        The  eGFR has been calculated using the MDRD equation. This calculation has not been validated in all clinical situations. eGFR's persistently <60 mL/min signify possible Chronic Kidney Disease. 12/25/2009 0208     Hepatic Function Panel     Component Value Date/Time   PROT 7.9 03/26/2012 1619   ALBUMIN 4.6 03/26/2012 1619   AST 40* 03/26/2012 1619   ALT 22 08/23/2012 1129   ALKPHOS 91 03/26/2012 1619   BILITOT 0.4 03/26/2012 1619     CBC    Component Value Date/Time   WBC 6.9 12/25/2009 0208   RBC 3.80* 12/25/2009 0208   HGB 12.6 12/25/2009 0208   HCT 36.2 12/25/2009 0208   PLT 208  12/25/2009 0208   MCV 95.4 12/25/2009 0208   MCH 33.1 12/25/2009 0208   MCHC 34.7 12/25/2009 0208   RDW 12.8 12/25/2009 0208   LYMPHSABS 2.5 12/25/2009 0208   MONOABS 0.7 12/25/2009 0208   EOSABS 0.1 12/25/2009 0208   BASOSABS 0.0 12/25/2009 0208     BNP No results found for this basename: probnp    Lipid Panel     Component Value Date/Time   CHOL 248* 08/23/2012 1129   TRIG 114 08/23/2012 1129   HDL 77 08/23/2012 1129   CHOLHDL 3.2 08/23/2012 1129   VLDL 23 08/23/2012 1129   LDLCALC 148* 08/23/2012 1129     RADIOLOGY: No results found.   ASSESSMENT AND PLAN: My impression is that Tami [name] is a 62 year old Afro-American female who has a history of PSVT/PAF which has been managed and controlled with low-dose flecainide 50 twice a day combined with beta blocker therapy. She denies any recent chest pain. She does have nonspecific T changes on her ECG. Her last echo Doppler study was done in February 2012 at which time showed normal systolic and diastolic function. We did discuss her lipid abnormalities. When she was taking low-dose Crestor her LDL cholesterol had normalized. However, on statin therapy her most recent cholesterol was 248 HDL 77 LDL 148 triglycerides 114 which was done in February 2014. She has been trying to adjust her diet. I have suggested we obtain an NMR profile to assess particle number was  Mr. LDL and we'll also check a CBC comp he is a metabolic panel TSH and vitamin D level. In February 2015 I am recommending that she undergo a 3 year followup echo Doppler study and I will see her back in the office for followup evaluation.   Lennette Bihari, MD, [facility] 05/23/2013 6:36 PM

## 2013-05-28 ENCOUNTER — Telehealth (HOSPITAL_COMMUNITY): Payer: Self-pay | Admitting: *Deleted

## 2013-06-05 ENCOUNTER — Other Ambulatory Visit: Payer: Self-pay | Admitting: Family Medicine

## 2013-06-11 ENCOUNTER — Telehealth (HOSPITAL_COMMUNITY): Payer: Self-pay | Admitting: *Deleted

## 2013-06-13 ENCOUNTER — Ambulatory Visit (HOSPITAL_COMMUNITY): Payer: BC Managed Care – PPO

## 2013-06-22 LAB — CBC
Hemoglobin: 12.7 g/dL (ref 12.0–15.0)
MCH: 31.1 pg (ref 26.0–34.0)
MCV: 90.2 fL (ref 78.0–100.0)
Platelets: 291 10*3/uL (ref 150–400)
RBC: 4.09 MIL/uL (ref 3.87–5.11)
WBC: 5 10*3/uL (ref 4.0–10.5)

## 2013-06-22 LAB — COMPREHENSIVE METABOLIC PANEL
ALT: 13 U/L (ref 0–35)
CO2: 26 mEq/L (ref 19–32)
Calcium: 9.7 mg/dL (ref 8.4–10.5)
Chloride: 106 mEq/L (ref 96–112)
Creat: 0.8 mg/dL (ref 0.50–1.10)
Glucose, Bld: 96 mg/dL (ref 70–99)
Total Bilirubin: 0.5 mg/dL (ref 0.3–1.2)
Total Protein: 7 g/dL (ref 6.0–8.3)

## 2013-06-25 LAB — NMR LIPOPROFILE WITH LIPIDS
HDL Size: 9.3 nm (ref >=9.2)
LDL (calc): 142 mg/dL — ABNORMAL HIGH (ref <100)
LDL Particle Number: 1718 nmol/L — ABNORMAL HIGH (ref <1000)
LP-IR Score: <25 (ref <=45)
Large VLDL-P: 1.7 nmol/L (ref <=2.7)
Small LDL Particle Number: 404 nmol/L (ref <=527)
Triglycerides: 103 mg/dL (ref <150)
VLDL Size: 40.8 nm (ref <=46.6)

## 2013-07-03 ENCOUNTER — Other Ambulatory Visit: Payer: Self-pay | Admitting: *Deleted

## 2013-07-03 MED ORDER — ROSUVASTATIN CALCIUM 10 MG PO TABS: 10.0000 mg | ORAL_TABLET | Freq: Every day | ORAL | Status: DC

## 2013-07-05 DIAGNOSIS — C50919 Malignant neoplasm of unspecified site of unspecified female breast: Secondary | ICD-10-CM

## 2013-07-05 DIAGNOSIS — Z9221 Personal history of antineoplastic chemotherapy: Secondary | ICD-10-CM

## 2013-07-05 DIAGNOSIS — Z923 Personal history of irradiation: Secondary | ICD-10-CM

## 2013-07-05 HISTORY — DX: Personal history of irradiation: Z92.3

## 2013-07-05 HISTORY — DX: Personal history of antineoplastic chemotherapy: Z92.21

## 2013-07-05 HISTORY — DX: Malignant neoplasm of unspecified site of unspecified female breast: C50.919

## 2013-07-05 HISTORY — PX: BREAST LUMPECTOMY: SHX2

## 2013-09-15 ENCOUNTER — Other Ambulatory Visit: Payer: Self-pay | Admitting: Family Medicine

## 2013-10-02 ENCOUNTER — Telehealth: Payer: Self-pay | Admitting: Physician Assistant

## 2013-10-02 NOTE — Telephone Encounter (Signed)
Wrong chart

## 2013-11-26 ENCOUNTER — Ambulatory Visit (INDEPENDENT_AMBULATORY_CARE_PROVIDER_SITE_OTHER): Payer: BC Managed Care – PPO | Admitting: Family Medicine

## 2013-11-26 VITALS — BP 122/70 | HR 71 | Temp 98.1°F | Resp 18 | Ht 64.25 in | Wt 157.0 lb

## 2013-11-26 DIAGNOSIS — J329 Chronic sinusitis, unspecified: Secondary | ICD-10-CM

## 2013-11-26 DIAGNOSIS — H109 Unspecified conjunctivitis: Secondary | ICD-10-CM

## 2013-11-26 DIAGNOSIS — R05 Cough: Secondary | ICD-10-CM

## 2013-11-26 DIAGNOSIS — R32 Unspecified urinary incontinence: Secondary | ICD-10-CM

## 2013-11-26 DIAGNOSIS — R059 Cough, unspecified: Secondary | ICD-10-CM

## 2013-11-26 MED ORDER — HYDROCOD POLST-CHLORPHEN POLST 10-8 MG/5ML PO LQCR: 5.0000 mL | Freq: Two times a day (BID) | ORAL | Status: DC | PRN

## 2013-11-26 MED ORDER — AMOXICILLIN-POT CLAVULANATE 875-125 MG PO TABS: 1.0000 | ORAL_TABLET | Freq: Two times a day (BID) | ORAL | Status: DC

## 2013-11-26 MED ORDER — TOBRAMYCIN 0.3 % OP SOLN: 1.0000 [drp] | Freq: Four times a day (QID) | OPHTHALMIC | Status: DC

## 2013-11-26 NOTE — Patient Instructions (Signed)

## 2013-11-26 NOTE — Progress Notes (Addendum)
Subjective:    Patient ID: Tami [name], female    DOB: May 12, 1951, 63 y.o.   MRN: [id number]  Cough Associated symptoms include headaches. Pertinent negatives include no chills or fever.  Headache  Associated symptoms include coughing and sinus pressure. Pertinent negatives include no fever.  Retired Education officer, [employer] Left eye redness with mattering.  She changed all the linen this am Chief Complaint  Patient presents with   Cough    x1 week   Headache   Nasal Congestion   redness in left eye    since this morning    This chart was scribed for Tami Haber, MD by Thea Alken, ED Scribe. This patient was seen in room 4 and the patient's care was started at 10:00 AM.  HPI Comments: Tami [name] is a 63 y.o. female who presents to the [facility] complaining of a constant, worsening cough onset 1 week with associated HA , nasal congestion, hoarseness and sinus pressure. Pt reports she woke up this morning with erythema to left eye. Pt reports after first she thought her symptoms were a sinus infection but reports her symptoms worsened. Pt is c/o cough. Pt has been taking Mucinex D and reports palpitation. Pt reports she has taken tussionex  in the past for cough and finds this medication to to be helpful.  Pt denies fever.   Past Medical History  Diagnosis Date   Heart palpitations    Osteoporosis    Asthma    SVT (supraventricular tachycardia)    Numbness of arm     Right Arm   Anemia     as a child   Irregular heartbeat    Anxiety    Arthritis    Atrial fibrillation    Heartburn    Hyperlipidemia    Pure hypercholesterolemia    Allergies  Allergen Reactions   Clarithromycin    Statins Other (See Comments)    Muscle soreness and weakness w/ Rosuvastatin and Zetia.   Sulfonamide Derivatives    Ciprofloxacin Hcl Palpitations   Prior to Admission medications   Medication Sig Start Date End Date Taking? Authorizing  Provider  aspirin 81 MG tablet Take 81 mg by mouth daily.   Yes Historical Provider, MD  b complex vitamins tablet Take 1 tablet by mouth daily.   Yes Historical Provider, MD  cholecalciferol (VITAMIN D) 1000 UNITS tablet Take 1,000 Units by mouth daily.   Yes Historical Provider, MD  flecainide (TAMBOCOR) 100 MG tablet Take 0.5 tablets (50 mg total) by mouth 2 (two) times daily. 01/04/13  Yes Tami Haber, MD  gabapentin (NEURONTIN) 300 MG capsule Take 1 capsule (300 mg total) by mouth 3 (three) times daily. PATIENT NEEDS OFFICE VISIT FOR ADDITIONAL REFILLS   Yes Tami Haber, MD  magnesium 30 MG tablet Take 30 mg by mouth 2 (two) times daily.   Yes Historical Provider, MD  metoprolol succinate (TOPROL-XL) 50 MG 24 hr tablet Take 1 tablet (50 mg total) by mouth daily. Take with or immediately following a meal. 01/04/13  Yes Tami Haber, MD  OVER THE COUNTER MEDICATION Omega XL   Yes Historical Provider, MD  PAXIL 20 MG tablet TAKE 1 TABLET EVERY MORNING 06/05/13  Yes Eleanore Kurtis Bushman, PA-C  valACYclovir (VALTREX) 500 MG tablet Take 1 tablet (500 mg total) by mouth 2 (two) times daily as needed. 07/27/12  Yes Tami Haber, MD  rosuvastatin (CRESTOR) 10 MG tablet Take 1 tablet (10 mg total) by mouth  daily. 07/03/13   Troy Sine, MD   Review of Systems  Constitutional: Negative for fever and chills.  HENT: Positive for congestion, sinus pressure and voice change.   Respiratory: Positive for cough.   Cardiovascular: Positive for palpitations.  Neurological: Positive for headaches.      Objective:   Physical Exam  Nursing note and vitals reviewed. Constitutional: She is oriented to person, place, and time. She appears well-developed and well-nourished. No distress.  HENT:  Head: Normocephalic and atraumatic.  Right Ear: External ear normal.  Mouth/Throat: No oropharyngeal exudate.  Mild cerumen in bilateral ears  Eyes: EOM are normal. Right eye exhibits no discharge. Left eye  exhibits discharge. No scleral icterus.  Injected left eye  Neck: Normal range of motion. Neck supple. No tracheal deviation present.  Cardiovascular: Normal rate.   Pulmonary/Chest: Effort normal. No respiratory distress. She has wheezes. She has no rales. She exhibits no tenderness.  Abdominal: Soft. She exhibits no distension.  Musculoskeletal: Normal range of motion.  Neurological: She is alert and oriented to person, place, and time.  Skin: Skin is warm and dry.  Psychiatric: She has a normal mood and affect. Her behavior is normal.      Assessment & Plan:    1. Sinusitis   2. Cough   3. Conjunctivitis    Meds ordered this encounter    amoxicillin-clavulanate (AUGMENTIN) 875-125 MG per tablet    Sig: Take 1 tablet by mouth 2 (two) times daily.    Dispense:  20 tablet    Refill:  0   tobramycin (TOBREX) 0.3 % ophthalmic solution    Sig: Place 1 drop into the left eye every 6 (six) hours.    Dispense:  5 mL    Refill:  0   chlorpheniramine-HYDROcodone (TUSSIONEX PENNKINETIC ER) 10-8 MG/5ML LQCR    Sig: Take 5 mLs by mouth every 12 (twelve) hours as needed.    Dispense:  115 mL    Refill:  0   Afrin nasal spray OTC

## 2013-12-17 ENCOUNTER — Other Ambulatory Visit: Payer: Self-pay

## 2013-12-17 DIAGNOSIS — I10 Essential (primary) hypertension: Secondary | ICD-10-CM

## 2013-12-17 NOTE — Telephone Encounter (Signed)
Patient states that her Cardiologist has retired and she needs a refill on heart medication Metoprolol Succinate (Tablet SR 24 hr) TOPROL-XL 50 MG. Patient requests a 90 day supply

## 2013-12-18 NOTE — Telephone Encounter (Signed)
Pt is overdue for BP check up, can we RF for 90 days, or just 30?

## 2013-12-19 MED ORDER — METOPROLOL SUCCINATE ER 50 MG PO TB24: 50.0000 mg | ORAL_TABLET | Freq: Every day | ORAL | Status: DC

## 2013-12-24 ENCOUNTER — Other Ambulatory Visit: Payer: Self-pay | Admitting: Family Medicine

## 2014-01-09 ENCOUNTER — Other Ambulatory Visit: Payer: Self-pay

## 2014-01-09 DIAGNOSIS — Z1231 Encounter for screening mammogram for malignant neoplasm of breast: Secondary | ICD-10-CM

## 2014-01-13 ENCOUNTER — Ambulatory Visit (INDEPENDENT_AMBULATORY_CARE_PROVIDER_SITE_OTHER): Payer: BC Managed Care – PPO | Admitting: Internal Medicine

## 2014-01-13 VITALS — BP 142/76 | HR 76 | Temp 98.1°F | Resp 24 | Ht 63.5 in | Wt 152.3 lb

## 2014-01-13 DIAGNOSIS — G43909 Migraine, unspecified, not intractable, without status migrainosus: Secondary | ICD-10-CM

## 2014-01-13 DIAGNOSIS — R112 Nausea with vomiting, unspecified: Secondary | ICD-10-CM

## 2014-01-13 DIAGNOSIS — R319 Hematuria, unspecified: Secondary | ICD-10-CM

## 2014-01-13 DIAGNOSIS — H53149 Visual discomfort, unspecified: Secondary | ICD-10-CM

## 2014-01-13 LAB — POCT UA - MICROSCOPIC ONLY
Casts, Ur, LPF, POC: NEGATIVE
Crystals, Ur, HPF, POC: NEGATIVE
MUCUS UA: POSITIVE
Yeast, UA: NEGATIVE

## 2014-01-13 LAB — POCT URINALYSIS DIPSTICK
BILIRUBIN UA: NEGATIVE
GLUCOSE UA: NEGATIVE
KETONES UA: NEGATIVE
Leukocytes, UA: NEGATIVE
Nitrite, UA: NEGATIVE
PH UA: 6.5
Protein, UA: NEGATIVE
Spec Grav, UA: 1.02
Urobilinogen, UA: 0.2

## 2014-01-13 LAB — CBC
HEMATOCRIT: 37.3 % (ref 36.0–46.0)
HEMOGLOBIN: 13.2 g/dL (ref 12.0–15.0)
MCH: 31.5 pg (ref 26.0–34.0)
MCHC: 35.4 g/dL (ref 30.0–36.0)
MCV: 89 fL (ref 78.0–100.0)
Platelets: 278 10*3/uL (ref 150–400)
RBC: 4.19 MIL/uL (ref 3.87–5.11)
RDW: 13.7 % (ref 11.5–15.5)
WBC: 8.8 10*3/uL (ref 4.0–10.5)

## 2014-01-13 LAB — SEDIMENTATION RATE: Sed Rate: 40 mm/hr — ABNORMAL HIGH (ref 0–22)

## 2014-01-13 MED ORDER — NALBUPHINE HCL 20 MG/ML IJ SOLN
20.0000 mg | Freq: Once | INTRAMUSCULAR | Status: AC
  Administered 2014-01-13: 20 mg via INTRAMUSCULAR

## 2014-01-13 MED ORDER — ONDANSETRON HCL 8 MG PO TABS: 8.0000 mg | ORAL_TABLET | Freq: Three times a day (TID) | ORAL | Status: DC | PRN

## 2014-01-13 MED ORDER — ONDANSETRON 4 MG PO TBDP
8.0000 mg | ORAL_TABLET | Freq: Once | ORAL | Status: AC
  Administered 2014-01-13: 8 mg via ORAL
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
## 2014-01-13 NOTE — Patient Instructions (Addendum)
Migraine Headache A migraine headache is an intense, throbbing pain on one or both sides of your head. A migraine can last for 30 minutes to several hours. CAUSES  The exact cause of a migraine headache is not always known. However, a migraine may be caused when nerves in the brain become irritated and release chemicals that cause inflammation. This causes pain. Certain things may also trigger migraines, such as:  Alcohol.  Smoking.  Stress.  Menstruation.  Aged cheeses.  Foods or drinks that contain nitrates, glutamate, aspartame, or tyramine.  Lack of sleep.  Chocolate.  Caffeine.  Hunger.  Physical exertion.  Fatigue.  Medicines used to treat chest pain (nitroglycerine), birth control pills, estrogen, and some blood pressure medicines. SIGNS AND SYMPTOMS  Pain on one or both sides of your head.  Pulsating or throbbing pain.  Severe pain that prevents daily activities.  Pain that is aggravated by any physical activity.  Nausea, vomiting, or both.  Dizziness.  Pain with exposure to bright lights, loud noises, or activity.  General sensitivity to bright lights, loud noises, or smells. Before you get a migraine, you may get warning signs that a migraine is coming (aura). An aura may include:  Seeing flashing lights.  Seeing bright spots, halos, or zig-zag lines.  Having tunnel vision or blurred vision.  Having feelings of numbness or tingling.  Having trouble talking.  Having muscle weakness. DIAGNOSIS  A migraine headache is often diagnosed based on:  Symptoms.  Physical exam.  A CT scan or MRI of your head. These imaging tests cannot diagnose migraines, but they can help rule out other causes of headaches. TREATMENT Medicines may be given for pain and nausea. Medicines can also be given to help prevent recurrent migraines.  HOME CARE INSTRUCTIONS  Only take over-the-counter or prescription medicines for pain or discomfort as directed by your  health care provider. The use of long-term narcotics is not recommended.  Lie down in a dark, quiet room when you have a migraine.  Keep a journal to find out what may trigger your migraine headaches. For example, write down:  What you eat and drink.  How much sleep you get.  Any change to your diet or medicines.  Limit alcohol consumption.  Quit smoking if you smoke.  Get 7-9 hours of sleep, or as recommended by your health care provider.  Limit stress.  Keep lights dim if bright lights bother you and make your migraines worse. SEEK IMMEDIATE MEDICAL CARE IF:   Your migraine becomes severe.  You have a fever.  You have a stiff neck.  You have vision loss.  You have muscular weakness or loss of muscle control.  You start losing your balance or have trouble walking.  You feel faint or pass out.  You have severe symptoms that are different from your first symptoms. MAKE SURE YOU:   Understand these instructions.  Will watch your condition.  Will get help right away if you are not doing well or get worse. Document Released: 06/21/2005 Document Revised: 04/11/2013 Document Reviewed: 02/26/2013 [facility] Patient Information 2015 [place], [place]. This information is not intended to replace advice given to you by your health care provider. Make sure you discuss any questions you have with your health care provider. Hematuria, Adult Hematuria is blood in your urine. It can be caused by a bladder infection, kidney infection, prostate infection, kidney stone, or cancer of your urinary tract. Infections can usually be treated with medicine, and a kidney stone usually  will pass through your urine. If neither of these is the cause of your hematuria, further workup to find out the reason may be needed. It is very important that you tell your health care provider about any blood you see in your urine, even if the blood stops without treatment or happens without causing pain. Blood in  your urine that happens and then stops and then happens again can be a symptom of a very serious condition. Also, pain is not a symptom in the initial stages of many urinary cancers. HOME CARE INSTRUCTIONS   Drink lots of fluid, 3-4 quarts a day. If you have been diagnosed with an infection, cranberry juice is especially recommended, in addition to large amounts of water.  Avoid caffeine, tea, and carbonated beverages, because they tend to irritate the bladder.  Avoid alcohol because it may irritate the prostate.  Only take over-the-counter or prescription medicines for pain, discomfort, or fever as directed by your health care provider.  If you have been diagnosed with a kidney stone, follow your health care provider's instructions regarding straining your urine to catch the stone.  Empty your bladder often. Avoid holding urine for long periods of time.  After a bowel movement, women should cleanse front to back. Use each tissue only once.  Empty your bladder before and after sexual intercourse if you are a female. SEEK MEDICAL CARE IF: You develop back pain, fever, a feeling of sickness in your stomach (nausea), or vomiting or if your symptoms are not better in 3 days. Return sooner if you are getting worse. SEEK IMMEDIATE MEDICAL CARE IF:   You have a persistent fever, with a temperature of 101.33F (38.8C) or greater.  You develop severe vomiting and are unable to keep the medicine down.  You develop severe back or abdominal pain despite taking your medicines.  You begin passing a large amount of blood or clots in your urine.  You feel extremely weak or faint, or you pass out. MAKE SURE YOU:   Understand these instructions.  Will watch your condition.  Will get help right away if you are not doing well or get worse. Document Released: 06/21/2005 Document Revised: 04/11/2013 Document Reviewed: 02/19/2013 [facility] Patient Information 2015 [place], [place]. This information is  not intended to replace advice given to you by your health care provider. Make sure you discuss any questions you have with your health care provider. Nausea and Vomiting Nausea is a sick feeling that often comes before throwing up (vomiting). Vomiting is a reflex where stomach contents come out of your mouth. Vomiting can cause severe loss of body fluids (dehydration). Children and elderly adults can become dehydrated quickly, especially if they also have diarrhea. Nausea and vomiting are symptoms of a condition or disease. It is important to find the cause of your symptoms. CAUSES   Direct irritation of the stomach lining. This irritation can result from increased acid production (gastroesophageal reflux disease), infection, food poisoning, taking certain medicines (such as nonsteroidal anti-inflammatory drugs), alcohol use, or tobacco use.  Signals from the brain.These signals could be caused by a headache, heat exposure, an inner ear disturbance, increased pressure in the brain from injury, infection, a tumor, or a concussion, pain, emotional stimulus, or metabolic problems.  An obstruction in the gastrointestinal tract (bowel obstruction).  Illnesses such as diabetes, hepatitis, gallbladder problems, appendicitis, kidney problems, cancer, sepsis, atypical symptoms of a heart attack, or eating disorders.  Medical treatments such as chemotherapy and radiation.  Receiving medicine that  makes you sleep (general anesthetic) during surgery. DIAGNOSIS Your caregiver may ask for tests to be done if the problems do not improve after a few days. Tests may also be done if symptoms are severe or if the reason for the nausea and vomiting is not clear. Tests may include:  Urine tests.  Blood tests.  Stool tests.  Cultures (to look for evidence of infection).  X-rays or other imaging studies. Test results can help your caregiver make decisions about treatment or the need for additional  tests. TREATMENT You need to stay well hydrated. Drink frequently but in small amounts.You may wish to drink water, sports drinks, clear broth, or eat frozen ice pops or gelatin dessert to help stay hydrated.When you eat, eating slowly may help prevent nausea.There are also some antinausea medicines that may help prevent nausea. HOME CARE INSTRUCTIONS   Take all medicine as directed by your caregiver.  If you do not have an appetite, do not force yourself to eat. However, you must continue to drink fluids.  If you have an appetite, eat a normal diet unless your caregiver tells you differently.  Eat a variety of complex carbohydrates (rice, wheat, potatoes, bread), lean meats, yogurt, fruits, and vegetables.  Avoid high-fat foods because they are more difficult to digest.  Drink enough water and fluids to keep your urine clear or pale yellow.  If you are dehydrated, ask your caregiver for specific rehydration instructions. Signs of dehydration may include:  Severe thirst.  Dry lips and mouth.  Dizziness.  Dark urine.  Decreasing urine frequency and amount.  Confusion.  Rapid breathing or pulse. SEEK IMMEDIATE MEDICAL CARE IF:   You have blood or brown flecks (like coffee grounds) in your vomit.  You have black or bloody stools.  You have a severe headache or stiff neck.  You are confused.  You have severe abdominal pain.  You have chest pain or trouble breathing.  You do not urinate at least once every 8 hours.  You develop cold or clammy skin.  You continue to vomit for longer than 24 to 48 hours.  You have a fever. MAKE SURE YOU:   Understand these instructions.  Will watch your condition.  Will get help right away if you are not doing well or get worse. Document Released: 06/21/2005 Document Revised: 09/13/2011 Document Reviewed: 11/18/2010 [facility] Patient Information 2015 [place], [place]. This information is not intended to replace advice given to  you by your health care provider. Make sure you discuss any questions you have with your health care provider.

## 2014-01-13 NOTE — Progress Notes (Signed)
   Subjective:    Patient ID: Tami [name], female    DOB: 11-21-1950, 63 y.o.   MRN: [id number]  HPI    Review of Systems     Objective:   Physical Exam        Assessment & Plan:

## 2014-01-15 ENCOUNTER — Ambulatory Visit (INDEPENDENT_AMBULATORY_CARE_PROVIDER_SITE_OTHER): Payer: BC Managed Care – PPO | Admitting: Internal Medicine

## 2014-01-15 VITALS — BP 134/84 | HR 61 | Temp 98.4°F | Resp 17 | Ht 65.0 in | Wt 154.0 lb

## 2014-01-15 DIAGNOSIS — R319 Hematuria, unspecified: Secondary | ICD-10-CM

## 2014-01-15 DIAGNOSIS — G43701 Chronic migraine without aura, not intractable, with status migrainosus: Secondary | ICD-10-CM

## 2014-01-15 LAB — POCT URINALYSIS DIPSTICK
BILIRUBIN UA: NEGATIVE
GLUCOSE UA: NEGATIVE
Ketones, UA: NEGATIVE
LEUKOCYTES UA: NEGATIVE
NITRITE UA: NEGATIVE
PH UA: 6
Protein, UA: NEGATIVE
Spec Grav, UA: <=1.005
UROBILINOGEN UA: 0.2

## 2014-01-15 LAB — POCT UA - MICROSCOPIC ONLY
CASTS, UR, LPF, POC: NEGATIVE
CRYSTALS, UR, HPF, POC: NEGATIVE
Mucus, UA: NEGATIVE
Yeast, UA: NEGATIVE

## 2014-01-15 MED ORDER — CELECOXIB 200 MG PO CAPS: ORAL_CAPSULE | ORAL | Status: DC

## 2014-01-15 MED ORDER — AMITRIPTYLINE HCL 25 MG PO TABS: 25.0000 mg | ORAL_TABLET | Freq: Every day | ORAL | Status: DC

## 2014-01-15 NOTE — Progress Notes (Signed)
   Subjective:    Patient ID: Tami [name], female    DOB: 07-29-1950, 63 y.o.   MRN: [id number]  HPI Pt presents in office today with migraine.  Patient states the pain shot that was given in the office on Sunday did help for a few hours but then she woke up with migraine pain again that night.  Patient sensitive to light and sound, but no aurora.  Patient also here to recheck urine but she is not complaining of any symptoms.  HA is much better but keeps her awake. No sensory loss, no NMSV loss. States this is her migraine.   Review of Systems     Objective:   Physical Exam  Constitutional: She is oriented to person, place, and time. Vital signs are normal. She appears well-developed and well-nourished. She is cooperative. She has a sickly appearance. She appears distressed.  HENT:  Head: Normocephalic.  Eyes: EOM are normal. Pupils are equal, round, and reactive to light.  Neck: Normal range of motion. Neck supple. No thyromegaly present.  Cardiovascular: Normal rate.   Pulmonary/Chest: Effort normal.  Lymphadenopathy:    She has no cervical adenopathy.  Neurological: She is alert and oriented to person, place, and time. She exhibits normal muscle tone. Coordination normal.  Psychiatric: She has a normal mood and affect. Her behavior is normal.   Results for orders placed in visit on 01/15/14  POCT URINALYSIS DIPSTICK      Result Value Ref Range   Color, UA yellow     Clarity, UA clear     Glucose, UA neg     Bilirubin, UA neg     Ketones, UA neg     Spec Grav, UA <=1.005     Blood, UA moderate     pH, UA 6.0     Protein, UA neg     Urobilinogen, UA 0.2     Nitrite, UA neg     Leukocytes, UA Negative    POCT UA - MICROSCOPIC ONLY      Result Value Ref Range   WBC, Ur, HPF, POC 0-1     RBC, urine, microscopic 3-5     Bacteria, U Microscopic trace     Mucus, UA neg     Epithelial cells, urine per micros 0-2     Crystals, Ur, HPF, POC neg     Casts, Ur, LPF, POC  neg     Yeast, UA neg            Assessment & Plan:  Hematuria resolved Migraine improved but persists Trial Elavil 25mg  hs Celebrex 200mg  po prn ha up to 2 per day. See Dr. Carlean Jews. 1 week if not well

## 2014-01-15 NOTE — Patient Instructions (Signed)

## 2014-01-15 NOTE — Progress Notes (Signed)
   Subjective:    Patient ID: Tami [name], female    DOB: February 06, 1951, 63 y.o.   MRN: [id number]  HPI    Review of Systems     Objective:   Physical Exam        Assessment & Plan:

## 2014-01-16 LAB — URINE CULTURE

## 2014-01-17 ENCOUNTER — Ambulatory Visit
Admission: RE | Admit: 2014-01-17 | Discharge: 2014-01-17 | Disposition: A | Discharge status: Home / Self Care | Payer: BC Managed Care – PPO | Source: Ambulatory Visit

## 2014-01-17 ENCOUNTER — Encounter (INDEPENDENT_AMBULATORY_CARE_PROVIDER_SITE_OTHER): Payer: Self-pay

## 2014-01-17 DIAGNOSIS — Z1231 Encounter for screening mammogram for malignant neoplasm of breast: Secondary | ICD-10-CM

## 2014-01-24 ENCOUNTER — Telehealth: Payer: Self-pay

## 2014-01-24 DIAGNOSIS — R928 Other abnormal and inconclusive findings on diagnostic imaging of breast: Secondary | ICD-10-CM

## 2014-01-24 NOTE — Telephone Encounter (Signed)
Please arrange the additional suggested tests and place order.  I will [place]

## 2014-01-24 NOTE — Telephone Encounter (Signed)
There are not any orders in the system for further testing. I believe we need to order the additional tested suggested by the MM results. Please advise what we should order and I will get this scheduled.

## 2014-01-24 NOTE — Telephone Encounter (Signed)
Orders have been placed.

## 2014-01-24 NOTE — Telephone Encounter (Signed)
Patient received results from mammogram and was told she needed follow up. There are no orders in her chart and she is very nervous. Wants to know what she needs to do next. Cb# [phone]

## 2014-01-25 ENCOUNTER — Other Ambulatory Visit: Payer: Self-pay | Admitting: *Deleted

## 2014-01-25 DIAGNOSIS — R928 Other abnormal and inconclusive findings on diagnostic imaging of breast: Secondary | ICD-10-CM

## 2014-02-01 ENCOUNTER — Ambulatory Visit
Admission: RE | Admit: 2014-02-01 | Discharge: 2014-02-01 | Disposition: A | Discharge status: Home / Self Care | Payer: BC Managed Care – PPO | Source: Ambulatory Visit | Attending: Family Medicine | Admitting: Family Medicine

## 2014-02-01 ENCOUNTER — Other Ambulatory Visit: Payer: Self-pay | Admitting: Family Medicine

## 2014-02-01 DIAGNOSIS — R928 Other abnormal and inconclusive findings on diagnostic imaging of breast: Secondary | ICD-10-CM

## 2014-02-08 ENCOUNTER — Ambulatory Visit
Admission: RE | Admit: 2014-02-08 | Discharge: 2014-02-08 | Disposition: A | Discharge status: Home / Self Care | Payer: BC Managed Care – PPO | Source: Ambulatory Visit | Attending: Family Medicine | Admitting: Family Medicine

## 2014-02-08 DIAGNOSIS — C50911 Malignant neoplasm of unspecified site of right female breast: Secondary | ICD-10-CM

## 2014-02-08 DIAGNOSIS — R928 Other abnormal and inconclusive findings on diagnostic imaging of breast: Secondary | ICD-10-CM

## 2014-02-08 HISTORY — DX: Malignant neoplasm of unspecified site of right female breast: C50.911

## 2014-02-13 ENCOUNTER — Inpatient Hospital Stay: Admission: RE | Admit: 2014-02-13 | Payer: BC Managed Care – PPO | Source: Ambulatory Visit

## 2014-02-13 ENCOUNTER — Other Ambulatory Visit: Payer: Self-pay | Admitting: Family Medicine

## 2014-02-13 DIAGNOSIS — C50919 Malignant neoplasm of unspecified site of unspecified female breast: Secondary | ICD-10-CM

## 2014-02-14 ENCOUNTER — Ambulatory Visit (INDEPENDENT_AMBULATORY_CARE_PROVIDER_SITE_OTHER): Payer: BC Managed Care – PPO | Admitting: General Surgery

## 2014-02-14 ENCOUNTER — Encounter (INDEPENDENT_AMBULATORY_CARE_PROVIDER_SITE_OTHER): Payer: Self-pay | Admitting: General Surgery

## 2014-02-14 VITALS — BP 142/80 | HR 64 | Temp 98.3°F | Resp 16 | Ht 64.0 in | Wt 152.4 lb

## 2014-02-14 DIAGNOSIS — C50911 Malignant neoplasm of unspecified site of right female breast: Secondary | ICD-10-CM

## 2014-02-14 DIAGNOSIS — C50311 Malignant neoplasm of lower-inner quadrant of right female breast: Secondary | ICD-10-CM | POA: Insufficient documentation

## 2014-02-14 DIAGNOSIS — C50919 Malignant neoplasm of unspecified site of unspecified female breast: Secondary | ICD-10-CM

## 2014-02-14 NOTE — Patient Instructions (Signed)
We will refer you for genetic testing. We will also refer you to medical oncology and radiation oncology. We will speak with your cardiologist to see if he needs to do any testing prior to having surgery.

## 2014-02-14 NOTE — Progress Notes (Signed)
Patient ID: Tami [name], female   DOB: 05-22-1951, 63 y.o.   MRN: [id number]  Chief Complaint  Patient presents with  . breast cancer    HPI Tami [name] is a 63 y.o. female.   HPI  She is referred by Dr. Autumn Patty because of newly diagnosed invasive right breast cancer. She was noted to have an abnormality on mammogram in the right breast at the 4:00 position 7 cm from nipple. 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 Proliferation rate 17%.  Her sister had breast cancer and died from it. She was in her 21s. She also has a niece with breast cancer.  Age at menarche was 42 First live birth was at approximately 63 years of age. She took hormone replacement therapy for 5 or 6 years. She had a benign biopsy of the left breast many years ago. She had a hysterectomy approximately 30 years ago and so does not know when she had menopause.  Past Medical History  Diagnosis Date  . Heart palpitations   . Osteoporosis   . Asthma   . SVT (supraventricular tachycardia)   . Numbness of arm     Right Arm  . Anemia     as a child  . Irregular heartbeat   . Anxiety   . Arthritis   . Atrial fibrillation   . Heartburn   . Hyperlipidemia   . Pure hypercholesterolemia     Past Surgical History  Procedure Laterality Date  . Abdominal hysterectomy  1987  . Cystoscopy  04/25/2012    Pt may be a candidate for research anti-incontinence procedure protocol  . Breast surgery      Left    Family History  Problem Relation Age of Onset  . Cancer Mother     skin cancer  . Diabetes Sister     Borderline  . Hypertension Sister   . Heart attack Sister   . Cancer Sister   . Diabetes Brother     Borderline    Social History History  Substance Use Topics  . Smoking status: Never Smoker   . Smokeless tobacco: Not on file  . Alcohol Use: No    Allergies  Allergen Reactions  . Clarithromycin   . Statins Other (See Comments)    Muscle soreness and  weakness w/ Rosuvastatin and Zetia.  . Sulfonamide Derivatives   . Ciprofloxacin Hcl Palpitations    Current Outpatient Prescriptions  Medication Sig Dispense Refill  . amitriptyline (ELAVIL) 25 MG tablet Take 1 tablet (25 mg total) by mouth at bedtime.  30 tablet  3  . aspirin 81 MG tablet Take 81 mg by mouth daily.      [name] b complex vitamins tablet Take 1 tablet by mouth daily.      . celecoxib (CELEBREX) 200 MG capsule Take 1 po pc prn ha, up to 2 in one day  30 capsule  1  . cholecalciferol (VITAMIN D) 1000 UNITS tablet Take 1,000 Units by mouth daily.      . flecainide (TAMBOCOR) 100 MG tablet Take 0.5 tablets (50 mg total) by mouth 2 (two) times daily.  180 tablet  2  . gabapentin (NEURONTIN) 300 MG capsule Take 1 capsule (300 mg total) by mouth 3 (three) times daily. PATIENT NEEDS OFFICE VISIT FOR ADDITIONAL REFILLS  90 capsule  0  . magnesium 30 MG tablet Take 30 mg by mouth 2 (two) times daily.      . metoprolol succinate (TOPROL-XL) 50  MG 24 hr tablet TAKE 1 TABLET (50 MG TOTAL) BY MOUTH DAILY. PATIENT NEEDS OFFICE VISIT FOR ADDITIONAL REFILLS  30 tablet  0  . ondansetron (ZOFRAN) 8 MG tablet Take 1 tablet (8 mg total) by mouth every 8 (eight) hours as needed for nausea or vomiting.  20 tablet  0  . OVER THE COUNTER MEDICATION Omega XL      . PAXIL 20 MG tablet TAKE 1 TABLET EVERY MORNING  90 tablet  1  . tobramycin (TOBREX) 0.3 % ophthalmic solution Place 1 drop into the left eye every 6 (six) hours.  5 mL  0  . valACYclovir (VALTREX) 500 MG tablet Take 1 tablet (500 mg total) by mouth 2 (two) times daily as needed.  60 tablet  11  . chlorpheniramine-HYDROcodone (TUSSIONEX PENNKINETIC ER) 10-8 MG/5ML LQCR Take 5 mLs by mouth every 12 (twelve) hours as needed.  115 mL  0  . rosuvastatin (CRESTOR) 10 MG tablet Take 1 tablet (10 mg total) by mouth daily.  30 tablet  6   No current facility-administered medications for this visit.    Review of Systems Review of Systems   Constitutional: Negative.   Respiratory: Negative.   Cardiovascular: Positive for palpitations.  Gastrointestinal: Negative.   Genitourinary: Negative.   Musculoskeletal: Positive for arthralgias.  Neurological: Positive for headaches.  Hematological: Negative.     Blood pressure 142/80, pulse 64, temperature 98.3 F (36.8 C), temperature source Oral, resp. rate 16, height 5\' 4"  (1.626 m), weight 152 lb 6.4 oz (69.128 kg).  Physical Exam Physical Exam  Constitutional: She appears well-developed and well-nourished. No distress.  Her 2 daughters are with her.  HENT:  Head: Normocephalic.  Eyes: EOM are normal. No scleral icterus.  Neck: Neck supple.  Cardiovascular: Normal rate and regular rhythm.   Pulmonary/Chest: Effort normal and breath sounds normal.  Right breast is without dominant masses or suspicious skin changes. There is ecchymosis in the lower outer quadrant of the right breast. Left breast is soft with vague upper outer quadrant scar. No dominant masses.  Abdominal: Soft. She exhibits no mass. There is no tenderness.  Lower transverse scar  Musculoskeletal: She exhibits no edema.  No supraclavicular or axillary adenopathy.  Lymphadenopathy:    She has no cervical adenopathy.  Neurological: She is alert.  Skin: Skin is warm and dry.  Psychiatric: She has a normal mood and affect. Her behavior is normal.    Data Reviewed Mammogram report. Ultrasound report. Pathology report.  Assessment    Newly diagnosed invasive right breast cancer. Has not had an MRI ordered. Has a family history of breast cancer as well as early menarche. Also has a history of atrial fibrillation.     Plan    MRI may be recommended but will discuss this with the radiologist. Will refer her for genetic testing. We'll also refer her to medical oncology and radiation oncology. We'll see if she needs any preoperative cardiac evaluation. I feel she is a good candidate for breast conservation and  she is interested in that.I have explained the procedure, risks, and aftercare.  The risks include but are not limited to bleeding, infection, wound problems, seroma formation, anesthesia, nerve injury, lymphedema, need for reexcision or removal of more lymph nodes at a later time.  She seems to understand and agrees with the plan.   Once she is seen all the consultants and we have results of genetic testing, we'll discuss surgery with her again.  Temprence Rhines J 02/14/2014, 12:31 PM

## 2014-02-15 ENCOUNTER — Telehealth: Payer: Self-pay | Admitting: Cardiovascular Disease

## 2014-02-15 NOTE — Telephone Encounter (Signed)
Told patient we have not received anything from her oncologist.  I will send this message to Dr. Claiborne Billings and Mariann Laster so they will be aware and on the look out for the surgical clearance.  States she did have an episode of rapid heart rate this AM upon standing up with lightheadedness. Resolved quickly.  She is under a great deal of stress.

## 2014-02-15 NOTE — Telephone Encounter (Signed)
Tami [name] is calling  Because she was just diagnosed with cancer and wanted to speak to Dr. Claiborne Billings about a surgery , because the doctors are concerned about her AFIb . Please Call    Thanks

## 2014-02-18 NOTE — Telephone Encounter (Signed)
Will have [name] schedule an appt with Dr. Claiborne Billings for cardiac clearance prior to breast (cancer) surgery.

## 2014-02-18 NOTE — Telephone Encounter (Signed)
Recent dx of breast CA. Former pt of Dr Rollene Fare. I saw in 11/14. On flecanide and BB for PAF and remote svt; no h/o chest pain. On ASA and not anticoagulation. If she has had recent recurrent symptoms, schedule for f/u ov; but prob OK for surgery.

## 2014-02-19 ENCOUNTER — Telehealth: Payer: Self-pay | Admitting: *Deleted

## 2014-02-19 NOTE — Telephone Encounter (Signed)
Receive referral from Dr. Pauletta Browns office and noticed the pt is scheduled to have surgery with Dr. Zella Richer.  Emiled Dr. Joseph Art to make him aware that we will see the pt after surgery.  Cancelled referral in EPIC.

## 2014-02-21 ENCOUNTER — Other Ambulatory Visit (INDEPENDENT_AMBULATORY_CARE_PROVIDER_SITE_OTHER): Payer: Self-pay | Admitting: General Surgery

## 2014-02-21 ENCOUNTER — Telehealth (INDEPENDENT_AMBULATORY_CARE_PROVIDER_SITE_OTHER): Payer: Self-pay | Admitting: *Deleted

## 2014-02-21 ENCOUNTER — Telehealth: Payer: Self-pay | Admitting: *Deleted

## 2014-02-21 DIAGNOSIS — C50911 Malignant neoplasm of unspecified site of right female breast: Secondary | ICD-10-CM

## 2014-02-21 NOTE — Telephone Encounter (Signed)
Referrals made but were cancelled for reasons unknown to [place].  Bernie making referrals again.

## 2014-02-21 NOTE — Telephone Encounter (Signed)
Pt was calling today in reference to Genetic Testing and some other testing that she thought she was supposed to have done before her surgery.  I did see that the last progress note stated that, but no referrals have been put in,.  Please advise!  Anderson [place]

## 2014-02-21 NOTE — Telephone Encounter (Signed)
Left message for pt to return my call so I can schedule her for genetic and for med onc.

## 2014-02-22 ENCOUNTER — Telehealth: Payer: Self-pay | Admitting: *Deleted

## 2014-02-22 NOTE — Telephone Encounter (Signed)
Pt returned my call and I confirmed 02/26/14 med onc appt & 02/27/14 genetic appt w/ her.  Unable to mail before appt letter - gave verbal.  Unable to mail welcoming packet - gave direction and instructions.  Unable to mail intake form - placed a note for one to be given at time of check in.  Emailed Jearld Fenton and Wright at [employer] to make them aware.

## 2014-02-25 ENCOUNTER — Telehealth: Payer: Self-pay | Admitting: *Deleted

## 2014-02-25 NOTE — Telephone Encounter (Signed)
Returned patient's call in reference to appt. Informed patient that location of appt is at cancer center on [address]. Patient verbalized understanding.

## 2014-02-26 ENCOUNTER — Ambulatory Visit (HOSPITAL_BASED_OUTPATIENT_CLINIC_OR_DEPARTMENT_OTHER): Payer: BC Managed Care – PPO | Admitting: Hematology and Oncology

## 2014-02-26 ENCOUNTER — Encounter: Payer: Self-pay | Admitting: Hematology and Oncology

## 2014-02-26 ENCOUNTER — Ambulatory Visit: Payer: BC Managed Care – PPO

## 2014-02-26 ENCOUNTER — Telehealth: Payer: Self-pay | Admitting: Hematology and Oncology

## 2014-02-26 VITALS — BP 160/73 | HR 66 | Temp 98.2°F | Resp 18 | Ht 64.0 in | Wt 155.8 lb

## 2014-02-26 DIAGNOSIS — C50911 Malignant neoplasm of unspecified site of right female breast: Secondary | ICD-10-CM

## 2014-02-26 DIAGNOSIS — C50319 Malignant neoplasm of lower-inner quadrant of unspecified female breast: Secondary | ICD-10-CM

## 2014-02-26 DIAGNOSIS — Z170 Estrogen receptor positive status [ER+]: Secondary | ICD-10-CM

## 2014-02-26 NOTE — Progress Notes (Signed)
[facility] NOTE  Patient Care Team: [name], MD as PCP - General (Family Medicine) Rebecca Eaton, MD (Cardiology) Ailene Rud, MD as Attending Physician (Urology)  CHIEF COMPLAINTS/PURPOSE OF CONSULTATION:  Newly diagnosed breast cancer  HISTORY OF PRESENTING ILLNESS:  Tami [name] 63 y.o. female is here because of recent diagnosis of right breast invasive ductal carcinoma. She had a routine mammogram that was done with 3-D detected abnormality in her right breast. Prior to that she is always felt a tenderness on the inferior aspect of the right breast but did not think much about it. She never felt any lumps or nodules. 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 The imaging did not reveal any lymph node involvement.  I reviewed her records extensively and collaborated the history with the patient.  SUMMARY OF ONCOLOGIC HISTORY:   Breast cancer-right   01/18/2014 Mammogram U/S Breast: 7 x 7 x 9 mm irregular taller than wide hypoechoic mass with a peripheral echogenic rim in the right breast 4 o'clock position 7 cm from the nipple. No right axillary lymphadenopathy.    02/14/2014 Initial Diagnosis Breast cancer-right Er 100%, PR 94%, Ki 67: 17%; Her2 Neg ratio 1.74; T1bN0Mo (Clinical Stage 1A)    In terms of breast cancer risk profile:  She menarched at early age of 55 and went to menopause at age 59  She had 2 pregnancies first live birth at age 55 She took harmone replacement therapy for 5-6 years She was never exposed to fertility medications or hormone replacement therapy.  She has family history of Breast/ cancer  MEDICAL HISTORY:  Past Medical History  Diagnosis Date  . Heart palpitations   . Osteoporosis   . Asthma   . SVT (supraventricular tachycardia)   .  Numbness of arm     Right Arm  . Anemia     as a child  . Irregular heartbeat   . Anxiety   . Arthritis   . Atrial fibrillation   . Heartburn   . Hyperlipidemia   . Pure hypercholesterolemia     SURGICAL HISTORY: Past Surgical History  Procedure Laterality Date  . Abdominal hysterectomy  1987  . Cystoscopy  04/25/2012    Pt may be a candidate for research anti-incontinence procedure protocol  . Breast surgery      Left    SOCIAL HISTORY: History   Social History  . Marital Status: Married    Spouse Name: N/A    Number of Children: N/A  . Years of Education: N/A   Occupational History  . Not on file.   Social History Main Topics  . Smoking status: Never Smoker   . Smokeless tobacco: Never Used  . Alcohol Use: No  . Drug Use: No  . Sexual Activity: Yes     Comment: number of sex partners in the last 12 months 1   Other Topics Concern  . Not on file   Social History Narrative   Exercise walking 2 day per week for 1 hour    FAMILY HISTORY: Family History  Problem Relation Age of Onset  . Cancer Mother     skin cancer  . Diabetes Sister     Borderline  . Hypertension Sister   . Heart attack Sister   . Cancer Sister   . Diabetes Brother  Borderline    ALLERGIES:  is allergic to latex; clarithromycin; statins; sulfonamide derivatives; and ciprofloxacin hcl.  MEDICATIONS:  Current Outpatient Prescriptions  Medication Sig Dispense Refill  . amitriptyline (ELAVIL) 25 MG tablet Take 1 tablet (25 mg total) by mouth at bedtime.  30 tablet  3  . aspirin 81 MG tablet Take 81 mg by mouth daily.      [name] b complex vitamins tablet Take 1 tablet by mouth daily.      . celecoxib (CELEBREX) 200 MG capsule Take 1 po pc prn ha, up to 2 in one day  30 capsule  1  . chlorpheniramine-HYDROcodone (TUSSIONEX PENNKINETIC ER) 10-8 MG/5ML LQCR Take 5 mLs by mouth every 12 (twelve) hours as needed.  115 mL  0  . cholecalciferol (VITAMIN D) 1000 UNITS tablet Take 1,000 Units  by mouth daily.      . flecainide (TAMBOCOR) 100 MG tablet Take 0.5 tablets (50 mg total) by mouth 2 (two) times daily.  180 tablet  2  . gabapentin (NEURONTIN) 300 MG capsule Take 1 capsule (300 mg total) by mouth 3 (three) times daily. PATIENT NEEDS OFFICE VISIT FOR ADDITIONAL REFILLS  90 capsule  0  . magnesium 30 MG tablet Take 30 mg by mouth 2 (two) times daily.      . metoprolol succinate (TOPROL-XL) 50 MG 24 hr tablet TAKE 1 TABLET (50 MG TOTAL) BY MOUTH DAILY. PATIENT NEEDS OFFICE VISIT FOR ADDITIONAL REFILLS  30 tablet  0  . ondansetron (ZOFRAN) 8 MG tablet Take 1 tablet (8 mg total) by mouth every 8 (eight) hours as needed for nausea or vomiting.  20 tablet  0  . OVER THE COUNTER MEDICATION Omega XL      . PAXIL 20 MG tablet TAKE 1 TABLET EVERY MORNING  90 tablet  1  . rosuvastatin (CRESTOR) 10 MG tablet Take 1 tablet (10 mg total) by mouth daily.  30 tablet  6  . tobramycin (TOBREX) 0.3 % ophthalmic solution Place 1 drop into the left eye every 6 (six) hours.  5 mL  0  . valACYclovir (VALTREX) 500 MG tablet Take 1 tablet (500 mg total) by mouth 2 (two) times daily as needed.  60 tablet  11   No current facility-administered medications for this visit.    REVIEW OF SYSTEMS:   Constitutional: Denies fevers, chills or abnormal night sweats Eyes: Denies blurriness of vision, double vision or watery eyes Ears, nose, mouth, throat, and face: Denies mucositis or sore throat Respiratory: Denies cough, dyspnea or wheezes Cardiovascular: Denies palpitation, chest discomfort or lower extremity swelling Gastrointestinal:  Denies nausea, heartburn or change in bowel habits Skin: Denies abnormal skin rashes Lymphatics: Denies new lymphadenopathy or easy bruising Neurological:Denies numbness, tingling or new weaknesses Behavioral/Psych: Mood is stable, no new changes  All other systems were reviewed with the patient and are negative.  PHYSICAL EXAMINATION: ECOG PERFORMANCE STATUS: 0 -  Asymptomatic  Filed Vitals:   02/26/14 1312  BP: 160/73  Pulse: 66  Temp: 98.2 F (36.8 C)  Resp: 18   Filed Weights   02/26/14 1312  Weight: 155 lb 12.8 oz (70.67 kg)    GENERAL:alert, no distress and comfortable SKIN: skin color, texture, turgor are normal, no rashes or significant lesions EYES: normal, conjunctiva are pink and non-injected, sclera clear OROPHARYNX:no exudate, no erythema and lips, buccal mucosa, and tongue normal  NECK: supple, thyroid normal size, non-tender, without nodularity LYMPH:  no palpable lymphadenopathy in the cervical, axillary or inguinal  LUNGS: clear to auscultation and percussion with normal breathing effort HEART: regular rate & rhythm and no murmurs and no lower extremity edema ABDOMEN:abdomen soft, non-tender and normal bowel sounds Musculoskeletal:no cyanosis of digits and no clubbing  PSYCH: alert & oriented x 3 with fluent speech NEURO: no focal motor/sensory deficits BREAST:  LABORATORY DATA:  I have reviewed the data as listed Lab Results  Component Value Date   WBC 8.8 01/13/2014   HGB 13.2 01/13/2014   HCT 37.3 01/13/2014   MCV 89.0 01/13/2014   PLT 278 01/13/2014   Lab Results  Component Value Date   NA 142 06/22/2013   K 4.1 06/22/2013   CL 106 06/22/2013   CO2 26 06/22/2013    RADIOGRAPHIC STUDIES: I have personally reviewed the radiological reports and agreed with the findings in the report. Ultrasound breast and mammogram reports were reviewed.  ASSESSMENT AND PLAN:  Breast cancer-right 1. Right breast invasive ductal carcinoma 9 mm size ER PR positive HER-2 -17% Ki-67 status post ultrasound-guided biopsy. Clinical stage TI B. N0 M0 stage IA: I discussed the pathology report in great detail as well as the radiology reports. I discussed his clinical staging and the significance of estrogen or just from receptors as well as HER-2/neu receptors. She does have a favorable subtype of breast cancer. I agree with the current  treatment plan of surgery up front. I discussed with her that pathologic staging will be available after the surgery including the lymph node status.  2. I discussed the indications of chemotherapy especially for large tumors and those that have positive lymph nodes or high risk by Oncotype DX. I discussed the once she is done with surgery radiation she would benefit from adjuvant antiestrogen therapy with aromatase inhibitors. We will discuss the risks and benefits of aromatase inhibitors after she returns from surgery. She understands the sequence of events surgery would be followed by radiation and that to be followed by antiestrogen therapy. We will make a final determination whether or not she needs chemotherapy after receiving the surgical pathology.  3. genetics counseling is happening tomorrow she will wait for the results of the genetic testing before undergoing surgery so this would delay surgery by at least 3-4 weeks. I will see her back in 2 months to discuss surgical pathology. Patient has a sister with breast cancer diagnosed at age of 31.   All questions were answered. The patient knows to call the clinic with any problems, questions or concerns. I spent 55 minutes counseling the patient face to face. The total time spent in the appointment was 60 minutes and more than 50% was on counseling.     Rulon Eisenmenger, MD 02/26/2014 2:13 PM

## 2014-02-26 NOTE — Progress Notes (Signed)
Note created by Dr. Gudena during office visit. Copy to patient, original to scan. 

## 2014-02-26 NOTE — Telephone Encounter (Signed)
per pof to sch pt appt-sch & gave pt copy of sch °

## 2014-02-26 NOTE — Assessment & Plan Note (Signed)
1. Right breast invasive ductal carcinoma 9 mm size ER PR positive HER-2 -17% Ki-67 status post ultrasound-guided biopsy. Clinical stage TI B. N0 M0 stage IA: I discussed the pathology report in great detail as well as the radiology reports. I discussed his clinical staging and the significance of estrogen or just from receptors as well as HER-2/neu receptors. She does have a favorable subtype of breast cancer. I agree with the current treatment plan of surgery up front. I discussed with her that pathologic staging will be available after the surgery including the lymph node status.  2. I discussed the indications of chemotherapy especially for large tumors and those that have positive lymph nodes or high risk by Oncotype DX. I discussed the once she is done with surgery radiation she would benefit from adjuvant antiestrogen therapy with aromatase inhibitors. We will discuss the risks and benefits of aromatase inhibitors after she returns from surgery. She understands the sequence of events surgery would be followed by radiation and that to be followed by antiestrogen therapy. We will make a final determination whether or not she needs chemotherapy after receiving the surgical pathology.  3. genetics counseling is happening tomorrow she will wait for the results of the genetic testing before undergoing surgery so this would delay surgery by at least 3-4 weeks. I will see her back in 2 months to discuss surgical pathology. Patient has a sister with breast cancer diagnosed at age of 60.

## 2014-02-27 ENCOUNTER — Encounter: Payer: Self-pay | Admitting: Genetic Counselor

## 2014-02-27 ENCOUNTER — Other Ambulatory Visit: Payer: BC Managed Care – PPO

## 2014-02-27 ENCOUNTER — Ambulatory Visit (HOSPITAL_BASED_OUTPATIENT_CLINIC_OR_DEPARTMENT_OTHER): Payer: BC Managed Care – PPO | Admitting: Genetic Counselor

## 2014-02-27 DIAGNOSIS — Z803 Family history of malignant neoplasm of breast: Secondary | ICD-10-CM

## 2014-02-27 DIAGNOSIS — IMO0002 Reserved for concepts with insufficient information to code with codable children: Secondary | ICD-10-CM

## 2014-02-27 DIAGNOSIS — C50919 Malignant neoplasm of unspecified site of unspecified female breast: Secondary | ICD-10-CM

## 2014-02-27 NOTE — Progress Notes (Signed)
Patient Name: Tami [name] Patient Age: 63 y.o. Encounter Date: 02/27/2014  Referring Physician: Jackolyn Confer, MD  Primary Care Provider: [name], MD   [name], a 63 y.o. female, is being seen at the [facility] due to a personal and family history of breast cancer.  She presents to clinic today with her daughter, Tami [name], to discuss the possibility of a hereditary predisposition to cancer and discuss whether genetic testing is warranted.  HISTORY OF PRESENT ILLNESS: Tami [name] was recently diagnosed with right breast cancer (IDC) at the age of 86. Treatment decisions are pending results of genetic testing.  The breast tumor was ER positive, PR positive, and HER2 not available for review.  Tami [name] stated she had a hysterectomy in 1987, but her ovaries remain intact. She had a colonoscopy ~10 years ago which was reportedly negative. She stated she has had 3 polyps in her lifetime.  Past Medical History  Diagnosis Date  . Heart palpitations   . Osteoporosis   . Asthma   . SVT (supraventricular tachycardia)   . Numbness of arm     Right Arm  . Anemia     as a child  . Irregular heartbeat   . Anxiety   . Arthritis   . Atrial fibrillation   . Heartburn   . Hyperlipidemia   . Pure hypercholesterolemia   . Malignant neoplasm of breast (female), unspecified site   . Family history of malignant neoplasm of breast     Past Surgical History  Procedure Laterality Date  . Abdominal hysterectomy  1987  . Cystoscopy  04/25/2012    Pt may be a candidate for research anti-incontinence procedure protocol  . Breast surgery      Left    History   Social History  . Marital Status: Married    Spouse Name: N/A    Number of Children: N/A  . Years of Education: N/A   Social History Main Topics  . Smoking status: Never Smoker   . Smokeless tobacco: Never Used  . Alcohol Use: No  . Drug Use: No  . Sexual Activity: Yes     Comment: number  of sex partners in the last 12 months 1   Other Topics Concern  . Not on file   Social History Narrative   Exercise walking 2 day per week for 1 hour     FAMILY HISTORY:   During the visit, a 4-generation pedigree was obtained. Significant diagnoses include the following:  Family History  Problem Relation Age of Onset  . Cancer Mother     skin cancer  . Diabetes Sister     Borderline  . Hypertension Sister   . Heart attack Sister   . Breast cancer Sister 66    maternal half-sister; deceased 42  . Diabetes Brother     Borderline; maternal half-brother  . Cancer Maternal Aunt     stomach and throat; heavy smoker and drinker; deceased 49  . Cancer Cousin 72    unk. primary; daughter of mat aunt with stomach/throat ca  . Cancer Other     breast/ovarian; daughter of mat cousin with unk. primary at 23; pt's 1st cousin-once-removed    Additionally, Tami [name] has two daughters (age 95 and 20). She has no full siblings. She has one paternal half-sister (age 40) and two maternal half-sisters and a maternal half-brother. Her distant cousin who reportedly had breast and ovarian cancers is a 4th degree relative to her, separated by  3 women, including her mother who died at 36 with only having had skin cancer.  Tami [name] ancestry is African American. There is no known Jewish ancestry and no consanguinity.  ASSESSMENT AND PLAN: Tami [name] is a 63 y.o. female with a personal and family history of breast cancer at later ages of onset. This history is not suggestive of a hereditary predisposition to cancer. BRCA1/BRCA2 testing is reasonable given that her paternal family is completely unknown and to provide her reassurance. We reviewed the characteristics, features and inheritance patterns of hereditary cancer syndromes. We also discussed genetic testing, including the appropriate family members to test, the process of testing, insurance coverage and implications of results. A negative result  will be very reassuring for her and her family.  Tami [name] wished to pursue genetic testing and a blood sample will be sent to [facility] for analysis of the BRCA1 and BRCA2 genes. A larger panel was not indicated and would not likely be covered by her insurance given her risk level. We discussed the implications of a positive, negative and/ or Variant of Uncertain Significance (VUS) result. Results should be available in approximately 2 weeks, at which point we will contact her and address implications for her as well as address genetic testing for at-risk family members, if needed.    We encouraged Tami [name] to remain in contact with [facility]etics annually so that we can update the family history and inform her of any changes in cancer genetics and testing that may be of benefit for this family. Ms.  [name] questions were answered to her satisfaction today.   Thank you for the referral and allowing [place] to share in the care of your patient.   The patient was seen for a total of 30 minutes, greater than 50% of which was spent face-to-face counseling. This patient was discussed with the overseeing provider who agrees with the above.   Steele Berg, MS, [place] Certified Genetic Counseor phone: [phone] [email or web address]

## 2014-03-01 ENCOUNTER — Ambulatory Visit: Payer: BC Managed Care – PPO | Admitting: Radiation Oncology

## 2014-03-01 ENCOUNTER — Ambulatory Visit: Payer: BC Managed Care – PPO

## 2014-03-01 NOTE — Progress Notes (Signed)
Note created by Dr. Gudena during office visit. Copy to patient, original to scan. 

## 2014-03-05 ENCOUNTER — Other Ambulatory Visit: Payer: Self-pay

## 2014-03-05 ENCOUNTER — Encounter: Payer: Self-pay | Admitting: Radiation Oncology

## 2014-03-05 DIAGNOSIS — C50911 Malignant neoplasm of unspecified site of right female breast: Secondary | ICD-10-CM

## 2014-03-05 NOTE — Progress Notes (Signed)
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 female who was found on screening mammogram on 01-18-14 to have a possible new right breast mass. This was 9 mm on [place] and 71m on spot compression views.   Biopsy on 02-08-14 revealed ER/PR+ Her2 negative right breast grade II invasive ductal carcinoma. Nodes are clinically negative by [place]  Oncoptype is tentatively planned after surgery. Surgery will be in the form of lumpectomy /SLN bx if genetic testing is negative.   Genetic testing results are pending. She is distressed today, but otherwise doing relatively well. Nursing will consult Social work.  PREVIOUS RADIATION THERAPY: No  PAST MEDICAL HISTORY:  has a past medical history of Heart palpitations; Osteoporosis; Asthma; SVT (supraventricular tachycardia); Numbness of arm; Anemia; Irregular heartbeat; Anxiety; Arthritis; Atrial fibrillation; Heartburn; Hyperlipidemia; Pure hypercholesterolemia; Malignant neoplasm of breast (female), unspecified site; Family history of malignant neoplasm of breast; and Invasive ductal carcinoma of right breast (02/08/14).    PAST SURGICAL HISTORY: Past Surgical History  Procedure Laterality Date  . Abdominal hysterectomy  1987  . Cystoscopy  04/25/2012    Pt may be a candidate for research anti-incontinence procedure protocol  . Breast surgery      Left    FAMILY HISTORY: family history includes Breast cancer (age of onset: 752 in her sister; Cancer in her maternal aunt, mother, and other; Cancer (age of onset: 73 in her cousin; Diabetes in her brother and sister; Heart  attack in her sister; Hypertension in her sister.  SOCIAL HISTORY:  reports that she has never smoked. She has never used smokeless tobacco. She reports that she does not drink alcohol or use illicit drugs.  ALLERGIES: Latex; Clarithromycin; Crestor; Statins; Sulfonamide derivatives; and Ciprofloxacin hcl  MEDICATIONS:  Current Outpatient Prescriptions  Medication Sig Dispense Refill  . amitriptyline (ELAVIL) 25 MG tablet Take 1 tablet (25 mg total) by mouth at bedtime.  30 tablet  3  . aspirin 81 MG tablet Take 81 mg by mouth daily.      .Marland Kitchenb complex vitamins tablet Take 1 tablet by mouth daily.      . celecoxib (CELEBREX) 200 MG capsule Take 1 po pc prn ha, up to 2 in one day  30 capsule  1  . chlorpheniramine-HYDROcodone (TUSSIONEX PENNKINETIC ER) 10-8 MG/5ML LQCR Take 5 mLs by mouth every 12 (twelve) hours as needed.  115 mL  0  . cholecalciferol (VITAMIN D) 1000 UNITS tablet Take 1,000 Units by mouth daily.      . flecainide (TAMBOCOR) 100 MG tablet Take 0.5 tablets (50 mg total) by mouth 2 (two) times daily.  180 tablet  2  . gabapentin (NEURONTIN) 300 MG capsule Take 1 capsule (300 mg total) by mouth 3 (three) times daily. PATIENT NEEDS OFFICE VISIT FOR ADDITIONAL REFILLS  90 capsule  0  . magnesium 30 MG tablet Take 30 mg by mouth 2 (two) times daily.      . metoprolol succinate (TOPROL-XL) 50 MG 24 hr tablet TAKE 1 TABLET (50 MG TOTAL) BY MOUTH DAILY. PATIENT NEEDS OFFICE VISIT FOR ADDITIONAL REFILLS  30 tablet  0  . ondansetron (  ZOFRAN) 8 MG tablet Take 1 tablet (8 mg total) by mouth every 8 (eight) hours as needed for nausea or vomiting.  20 tablet  0  . OVER THE COUNTER MEDICATION Omega XL      . PAXIL 20 MG tablet TAKE 1 TABLET EVERY MORNING  90 tablet  1  . valACYclovir (VALTREX) 500 MG tablet Take 1 tablet (500 mg total) by mouth 2 (two) times daily as needed.  60 tablet  11   No current facility-administered medications for this encounter.    REVIEW OF SYSTEMS:  Notable for  that above.   PHYSICAL EXAM:  height is 5' 4"  (1.626 m) and weight is 154 lb 3.2 oz (69.945 kg). Her temperature is 98.7 F (37.1 C). Her blood pressure is 134/70 and her pulse is 58.   General: Alert and oriented, in no acute distress HEENT: Head is normocephalic. Extraocular movements are intact. Oropharynx is clear. Neck: Neck is supple, no palpable cervical or supraclavicular lymphadenopathy. Heart: Regular in rate and rhythm with no murmurs, rubs, or gallops. Chest: Clear to auscultation bilaterally, with no rhonchi, wheezes, or rales. Abdomen: Soft, nontender, nondistended, with no rigidity or guarding. Extremities: No cyanosis or edema. Lymphatics: No concerning lymphadenopathy. Skin: No concerning lesions. Musculoskeletal: symmetric strength and muscle tone throughout. Neurologic: Cranial nerves II through XII are grossly intact. No obvious focalities. Speech is fluent. Coordination is intact. Psychiatric: Judgment and insight are intact. Affect is appropriate. Breasts: right breast, LIQ, 1cm thickened area. Inverted nipples. Otherwise, breasts and axillae are negative   ECOG = 0  0 - Asymptomatic (Fully active, able to carry on all predisease activities without restriction)  1 - Symptomatic but completely ambulatory (Restricted in physically strenuous activity but ambulatory and able to carry out work of a light or sedentary nature. For example, light housework, office work)  2 - Symptomatic, <50% in bed during the day (Ambulatory and capable of all self care but unable to carry out any work activities. Up and about more than 50% of waking hours)  3 - Symptomatic, >50% in bed, but not bedbound (Capable of only limited self-care, confined to bed or chair 50% or more of waking hours)  4 - Bedbound (Completely disabled. Cannot carry on any self-care. Totally confined to bed or chair)  5 - Death   Eustace Pen MM, Creech RH, Tormey DC, et al. [phone]). "Toxicity and response criteria of  the [facility]". [place] Oncol. 5 (6): 649-55   LABORATORY DATA:  Lab Results  Component Value Date   WBC 8.8 01/13/2014   HGB 13.2 01/13/2014   HCT 37.3 01/13/2014   MCV 89.0 01/13/2014   PLT 278 01/13/2014   CMP     Component Value Date/Time   NA 142 06/22/2013 1405   K 4.1 06/22/2013 1405   CL 106 06/22/2013 1405   CO2 26 06/22/2013 1405   GLUCOSE 96 06/22/2013 1405   BUN 8 06/22/2013 1405   CREATININE 0.80 06/22/2013 1405   CREATININE 0.83 12/25/2009 0208   CALCIUM 9.7 06/22/2013 1405   PROT 7.0 06/22/2013 1405   ALBUMIN 4.3 06/22/2013 1405   AST 18 06/22/2013 1405   ALT 13 06/22/2013 1405   ALKPHOS 77 06/22/2013 1405   BILITOT 0.5 06/22/2013 1405   GFRNONAA >60 12/25/2009 0208   GFRAA  Value: >60        The eGFR has been calculated using the MDRD equation. This calculation has not been validated in all clinical situations.  eGFR's persistently <60 mL/min signify possible Chronic Kidney Disease. 12/25/2009 0208         RADIOGRAPHY: Mm Digital Diagnostic Unilat R  02/11/2014   ADDENDUM REPORT: 02/11/2014 10:34  ADDENDUM: Pathology has returned. Pathology is invasive ductal carcinoma, grade 2, concordant with imaging. Patient was notified of the pathology results. Patient has no post biopsy complications other than mild bruising. Patient will be notified of her followup [place] appointment.   Electronically Signed   By: Lajean Manes M.D.   On: 02/11/2014 10:34   02/11/2014   CLINICAL DATA:  Post ultrasound-guided right breast biopsy  EXAM: DIAGNOSTIC right MAMMOGRAM POST ULTRASOUND BIOPSY  COMPARISON:  Previous exams  FINDINGS: Mammographic images were obtained following ultrasound guided biopsy of right breast mass with ribbon shaped clip placement. The ribbon shaped clip is appropriately located.  IMPRESSION: Appropriate ribbon shaped clip location, right breast mass 4 o'clock location biopsy site.  Final Assessment: Post Procedure Mammograms for Marker  Placement  Electronically Signed: By: Conchita Paris M.D. On: 02/08/2014 12:54   [place] Rt Breast Bx W Loc Dev 1st Lesion Img Bx Spec US Guide  02/08/2014   ADDENDUM REPORT: 02/08/2014 12:55  ADDENDUM: Addendum by Dr. Nyoka Cowden on 02/08/2014 at 12:54 p.m. The clip placed at the time of biopsy of the right breast 4 o'clock location is a ribbon shaped clip, not a coil shaped clip. The report is otherwise correct as originally dictated.   Electronically Signed   By: Conchita Paris M.D.   On: 02/08/2014 12:55   02/08/2014   CLINICAL DATA:  Right breast mass 4 o'clock location  EXAM: ULTRASOUND GUIDED right BREAST CORE NEEDLE BIOPSY  COMPARISON:  Previous exams.  PROCEDURE: I met with the patient and we discussed the procedure of ultrasound-guided biopsy, including benefits and alternatives. We discussed the high likelihood of a successful procedure. We discussed the risks of the procedure including infection, bleeding, tissue injury, clip migration, and inadequate sampling. Informed written consent was given. The usual time-out protocol was performed immediately prior to the procedure.  Using sterile technique and 2% Lidocaine as local anesthetic, under direct ultrasound visualization, a 12 gauge vacuum-assisteddevice was used to perform biopsy of right breast mass 4 o'clock location using a lateral to medial approach. At the conclusion of the procedure, a coil shaped tissue marker clip was deployed into the biopsy cavity. Follow-up 2-view mammogram was performed and dictated separately.  IMPRESSION: Ultrasound-guided biopsy of right breast mass 4 o'clock location with coil shaped clip placement. Pathology is pending. No apparent complications.  Electronically Signed: By: Conchita Paris M.D. On: 02/08/2014 12:16      IMPRESSION/PLAN: Lovely 63 yo with breast cancer as above.  Genetic testing results pending.  Tentative lumpectomy/SLN bx and oncotype testing planned.  It was a pleasure meeting the patient today. We  discussed the risks, benefits, and side effects of radiotherapy in the setting of breast conservation. We discussed that radiation would take approximately 4-6 weeks to complete and that I would give the patient a few weeks to heal following surgery before starting treatment planning. Chemotherapy would precede RT planning, if it is recommended. We spoke about acute effects including skin irritation and fatigue as well as much less common late effects including lung  irritation. We spoke about the latest technology that is used to minimize the risk of late effects for breast cancer patients undergoing radiotherapy. No guarantees of treatment were given. The patient is enthusiastic about proceeding with treatment. I look forward to participating  in the patient's care. I will await her referral back to me. Consent signed today.  __________________________________________   Eppie Gibson, MD

## 2014-03-05 NOTE — Progress Notes (Addendum)
Location of Breast Cancer: Right Breast. Lower Inner Quadrant  Histology per Pathology Report:  02/08/14 Diagnosis Breast, right, needle core biopsy, mass, 4 o'clock - INVASIVE DUCTAL CARCINOMA, SEE  Receptor Status: ER(100%), PR (94%), Her2-neu (No Amplification), Ki-67(17%)  [name] had a routine mammogram in July 2015 that was done with 3-D detected abnormality in her right breast. Prior to that she is always felt a tenderness on the inferior aspect of the right breast but did not think much about it. She never felt any lumps or nodules. 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 The imaging did not reveal any lymph node involvement.   Past/Anticipated interventions by surgeon, if any: Dr. Jackolyn Confer- Needle Core Biopsy of the right Breast  Past/Anticipated interventions by medical oncology, if any: Chemotherapy Dr. Lindi Adie. ? Antiestrogen Therapy  Lymphedema issues, if any: None   Pain issues, if any:  NO  SAFETY ISSUES:  Prior radiation? NO  Pacemaker/ICD? NO  Possible current pregnancy?NO  Is the patient on methotrexate? NO  Current Complaints / other details:    Her sister had breast cancer and died from it. She was in her 70s. She also has a niece with breast cancer. Age at menarche was 10, First live birth was at approximately 63 years of age. She took hormone replacement therapy for 5 or 6 years. She had a benign biopsy of the left breast many years ago. She had a hysterectomy approximately 30 years ago and so does not know when she had menopause.

## 2014-03-06 ENCOUNTER — Encounter: Payer: Self-pay | Admitting: Radiation Oncology

## 2014-03-06 ENCOUNTER — Ambulatory Visit
Admission: RE | Admit: 2014-03-06 | Discharge: 2014-03-06 | Disposition: A | Discharge status: Home / Self Care | Payer: BC Managed Care – PPO | Source: Ambulatory Visit | Attending: Radiation Oncology | Admitting: Radiation Oncology

## 2014-03-06 VITALS — BP 134/70 | HR 58 | Temp 98.7°F | Ht 64.0 in | Wt 154.2 lb

## 2014-03-06 DIAGNOSIS — C50911 Malignant neoplasm of unspecified site of right female breast: Secondary | ICD-10-CM

## 2014-03-06 DIAGNOSIS — M810 Age-related osteoporosis without current pathological fracture: Secondary | ICD-10-CM | POA: Diagnosis not present

## 2014-03-06 DIAGNOSIS — I4891 Unspecified atrial fibrillation: Secondary | ICD-10-CM | POA: Insufficient documentation

## 2014-03-06 DIAGNOSIS — I498 Other specified cardiac arrhythmias: Secondary | ICD-10-CM | POA: Insufficient documentation

## 2014-03-06 DIAGNOSIS — Z7982 Long term (current) use of aspirin: Secondary | ICD-10-CM | POA: Insufficient documentation

## 2014-03-06 DIAGNOSIS — C50319 Malignant neoplasm of lower-inner quadrant of unspecified female breast: Secondary | ICD-10-CM | POA: Diagnosis not present

## 2014-03-06 DIAGNOSIS — M129 Arthropathy, unspecified: Secondary | ICD-10-CM | POA: Diagnosis not present

## 2014-03-06 DIAGNOSIS — Z791 Long term (current) use of non-steroidal anti-inflammatories (NSAID): Secondary | ICD-10-CM | POA: Diagnosis not present

## 2014-03-06 DIAGNOSIS — Z170 Estrogen receptor positive status [ER+]: Secondary | ICD-10-CM | POA: Insufficient documentation

## 2014-03-06 DIAGNOSIS — C50311 Malignant neoplasm of lower-inner quadrant of right female breast: Secondary | ICD-10-CM

## 2014-03-06 DIAGNOSIS — Z90710 Acquired absence of both cervix and uterus: Secondary | ICD-10-CM | POA: Insufficient documentation

## 2014-03-06 DIAGNOSIS — Z510 Encounter for antineoplastic radiation therapy: Secondary | ICD-10-CM | POA: Insufficient documentation

## 2014-03-06 DIAGNOSIS — Z853 Personal history of malignant neoplasm of breast: Secondary | ICD-10-CM | POA: Diagnosis not present

## 2014-03-06 HISTORY — DX: Malignant neoplasm of unspecified site of right female breast: C50.911

## 2014-03-12 ENCOUNTER — Encounter: Payer: Self-pay | Admitting: Genetic Counselor

## 2014-03-12 NOTE — Progress Notes (Signed)
GENETIC TEST RESULTS  Referring Physician: Jackolyn Confer, MD   Ms. Brotzman was called today to discuss genetic test results. Please see the Genetics note from her visit on 02/27/14 for a detailed discussion of her personal and family history.  GENETIC TESTING: At the time of Ms. Beddow's visit, we recommended she pursue genetic testing of the BRCA1 and BRC2 genes. This test, which included sequencing and deletion/duplication analysis, was performed at [facility]. Testing was normal and did not reveal a mutation in these two genes.   We discussed with Ms. Base that since the current test is not perfect, it is possible there may be a gene mutation that current testing cannot detect, but that chance is small. We also discussed that it is possible that a different genetic factor, which has not yet been discovered, is responsible for the cancer diagnoses in the family. Given the family history, the likelihood of this is low.  ADDITIONAL GENETIC TESTING: We discussed with Ms. Carlson that there are other genes that are associated with increased cancer risk that can be analyzed. The laboratories that offer such testing look at these additional genes via a hereditary cancer gene panel. Should Ms. Ashton wish to discuss this additional testing with [place] or pursue such testing, we are happy to coordinate this at any time, but do not feel that she is at significant risk of harboring a mutation in a different gene.     CANCER SCREENING: This result suggests that Ms. Follmer's cancer was most likely not due to an inherited predisposition. Most cancers happen by chance and this negative test, along with details of her family history, suggests that her cancer falls into this category. We, therefore, recommended she continue to follow the cancer screening guidelines provided by her physician.   FAMILY MEMBERS: Women in the family are at some increased risk of developing breast cancer, over  the general population risk, simply due to the family history. We recommended they have a yearly mammogram beginning at age 109, a yearly clinical breast exam, and perform monthly breast self-exams. A gynecologic exam is recommended yearly. Colon cancer screening is recommended to begin by age 10.  Her distant maternal cousin who reportedly had both breast and ovarian cancer should have a genetics evaluation and appropriate genetic testing.  Lastly, we discussed with Ms. Karam that cancer genetics is a rapidly advancing field and it is possible that new genetic tests will be appropriate for her in the future. We encouraged her to remain in contact with [place] on an annual basis so we can update her personal and family histories, and let her know of advances in cancer genetics that may benefit the family. Our contact number was provided. Ms. Pizzolato questions were answered to her satisfaction today, and she knows she is welcome to call anytime with additional questions.    Steele Berg, MS, [place] Certified Genetic Counseor phone: [phone] [email or web address]

## 2014-03-13 ENCOUNTER — Other Ambulatory Visit (INDEPENDENT_AMBULATORY_CARE_PROVIDER_SITE_OTHER): Payer: Self-pay | Admitting: General Surgery

## 2014-03-13 ENCOUNTER — Encounter (INDEPENDENT_AMBULATORY_CARE_PROVIDER_SITE_OTHER): Payer: Self-pay | Admitting: General Surgery

## 2014-03-13 DIAGNOSIS — C50911 Malignant neoplasm of unspecified site of right female breast: Secondary | ICD-10-CM

## 2014-03-13 NOTE — Progress Notes (Signed)
Patient ID: Tami [name], female   DOB: 1950-10-30, 63 y.o.   MRN: [id number] Her genetic testing was negative. She has been seen by medical oncology and radiation oncology. All feel she is a good candidate for right breast lumpectomy and right axillary sentinel lymph node biopsy (breast conservation). She and I have discussed this previously. We discussed it again today and she would like to proceed with this plan. She is going to see Dr. Claiborne Billings, her cardiologist, regarding her atrial fibrillation on September 21. We will plan on scheduling the operation on a date after that appointment.

## 2014-03-15 ENCOUNTER — Other Ambulatory Visit: Payer: Self-pay | Admitting: Family Medicine

## 2014-03-15 ENCOUNTER — Encounter: Payer: Self-pay | Admitting: *Deleted

## 2014-03-15 NOTE — Progress Notes (Signed)
[place] Psychosocial Distress Screening Clinical Social Work  Clinical Social Work was referred by distress screening protocol.  The patient scored a 8 on the Psychosocial Distress Thermometer which indicates severe distress. Clinical Social Worker phoned pt to assess for distress and other psychosocial needs. Pt reports to have cancer insurance and is not sure how to access this policy. She also had concerns of how much her insurance was going to pay. CSW discussed individuals and roles at the center that can assist in answering these questions. Pt also plans to contact her own health insurance policy.  Pt shared that her husband has an elderly mother that he cares for several nights a week and she has concerns that she will need his help while going through her cancer treatment. We talked some today about communication with family, partners and children about her diagnosis and advocating for her needs. CSW discussed in detail members of the Pt and Family Support Team that could assist her and resources available such as [employer], support group and others. Pt very appreciative of the call and is eager to talk further after her surgery and she knows what her treatment plan will be. Pt aware and agrees to call CSW for follow up.   ONCBCN DISTRESS SCREENING 03/06/2014  Tami [name] the number that describes how much distress you have been experiencing in the past week 8  Practical problem type Insurance  Family Problem type Partner  Emotional problem type Nervousness/Anxiety  Information Concerns Type Lack of info about treatment  Physical Problem type Pain;Sleep/insomnia;Other (comment)  Physician notified of physical symptoms Yes    Clinical Social Worker follow up needed: Yes.    If yes, follow up plan: See above Loren Racer, [place] Worker [place]  [facility] Phone: [phone] Fax: [phone]

## 2014-03-25 ENCOUNTER — Ambulatory Visit: Payer: BC Managed Care – PPO | Admitting: Cardiovascular Disease

## 2014-03-27 ENCOUNTER — Ambulatory Visit (INDEPENDENT_AMBULATORY_CARE_PROVIDER_SITE_OTHER): Payer: BC Managed Care – PPO | Admitting: Cardiovascular Disease

## 2014-03-27 ENCOUNTER — Encounter: Payer: Self-pay | Admitting: Cardiovascular Disease

## 2014-03-27 VITALS — BP 142/76 | HR 61 | Ht 64.0 in | Wt 151.9 lb

## 2014-03-27 DIAGNOSIS — C50911 Malignant neoplasm of unspecified site of right female breast: Secondary | ICD-10-CM

## 2014-03-27 DIAGNOSIS — I471 Supraventricular tachycardia, unspecified: Secondary | ICD-10-CM

## 2014-03-27 DIAGNOSIS — C50919 Malignant neoplasm of unspecified site of unspecified female breast: Secondary | ICD-10-CM

## 2014-03-27 DIAGNOSIS — I10 Essential (primary) hypertension: Secondary | ICD-10-CM

## 2014-03-27 DIAGNOSIS — E7800 Pure hypercholesterolemia, unspecified: Secondary | ICD-10-CM

## 2014-03-27 DIAGNOSIS — E785 Hyperlipidemia, unspecified: Secondary | ICD-10-CM

## 2014-03-27 LAB — COMPREHENSIVE METABOLIC PANEL
ALBUMIN: 4.5 g/dL (ref 3.5–5.2)
ALT: 34 U/L (ref 0–35)
AST: 25 U/L (ref 0–37)
Alkaline Phosphatase: 79 U/L (ref 39–117)
BUN: 13 mg/dL (ref 6–23)
CALCIUM: 9.8 mg/dL (ref 8.4–10.5)
CHLORIDE: 105 meq/L (ref 96–112)
CO2: 27 mEq/L (ref 19–32)
CREATININE: 0.83 mg/dL (ref 0.50–1.10)
GLUCOSE: 91 mg/dL (ref 70–99)
Potassium: 4.6 mEq/L (ref 3.5–5.3)
Sodium: 140 mEq/L (ref 135–145)
Total Bilirubin: 0.5 mg/dL (ref 0.2–1.2)
Total Protein: 7.7 g/dL (ref 6.0–8.3)

## 2014-03-27 LAB — CBC
HEMATOCRIT: 36.5 % (ref 36.0–46.0)
Hemoglobin: 12.8 g/dL (ref 12.0–15.0)
MCH: 30.9 pg (ref 26.0–34.0)
MCHC: 35.1 g/dL (ref 30.0–36.0)
MCV: 88.2 fL (ref 78.0–100.0)
Platelets: 303 10*3/uL (ref 150–400)
RBC: 4.14 MIL/uL (ref 3.87–5.11)
RDW: 14.1 % (ref 11.5–15.5)
WBC: 4.6 10*3/uL (ref 4.0–10.5)

## 2014-03-27 LAB — LIPID PANEL
CHOLESTEROL: 228 mg/dL — AB (ref 0–200)
HDL: 73 mg/dL (ref >39)
LDL Cholesterol: 140 mg/dL — ABNORMAL HIGH (ref 0–99)
TRIGLYCERIDES: 76 mg/dL (ref <150)
Total CHOL/HDL Ratio: 3.1 Ratio
VLDL: 15 mg/dL (ref 0–40)

## 2014-03-27 LAB — TSH: TSH: 1.194 u[IU]/mL (ref 0.350–4.500)

## 2014-03-27 LAB — MAGNESIUM: MAGNESIUM: 1.9 mg/dL (ref 1.5–2.5)

## 2014-03-27 NOTE — Patient Instructions (Signed)
Your physician has requested that you have an echocardiogram. Echocardiography is a painless test that uses sound waves to create images of your heart. It provides your doctor with information about the size and shape of your heart and how well your heart's chambers and valves are working. This procedure takes approximately one hour. There are no restrictions for this procedure. This will be scheduled this week.  Your physician recommends that you return for lab work fasting.   Your physician wants you to follow-up in:  6 months.You will receive a reminder letter in the mail two months in advance. If you don't receive a letter, please call our office to schedule the follow-up appointment.

## 2014-03-27 NOTE — Progress Notes (Signed)
Patient ID: Tami [name], female   DOB: [birth year], 63 y.o.   MRN: [id number]     HPI: Tami [name] is a 63 year old African American female who is a former patient of Dr. Rollene Fare. She established cardiology care with me in November 2014.  She now presents for ten-month follow-up evaluation and evaluation prior to her planned lumpectomy with possible radiation therapy for recently diagnosed breast CA.  Tami [name] has a history of palpitations, paroxysmal supraventricular tachycardia as well as paroxysmal atrial fibrillation which have been detected on remote monitoring. She has been treated with low-dose flecainide 50 mg twice a day in addition to metoprolol succinate 50 mg daily. She also has a history of hyperlipidemia in the past has had LDLs in the 145 range but apparently was intolerant to several statins including Crestor. She also states she developed some myalgias secondary to Zetia. When she was last seen by Dr. Rollene Fare he had written her a prescription for livalo 1 mg although but she never had this filled.  She also has continued problems with low back and hip discomfort and has some sciatica issues. She is followed by Dr. Mayer Camel. She also has undergone MRIs of her back.  She denies any development of chest pain. An echo Doppler study in February 2012 showed normal systolic and diastolic function. She did not have significant valvular pathology. A nuclear perfusion study in 2010 which was normal. She presents for evaluation.  She is unaware of recent tachycardia palpitations. She denies recent chest pressure. She does have difficulty with her back and right hip. In the past as a result of her chronic pain she was started on Paxil to help with depression. She also takes Neurontin for her neuropathy.  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  She's been found to be  estrogen receptor positive, progesterone receptor positive, as well as a TR 2.  Negative.  She is scheduled to undergo lumpectomy next week with Dr. Zella Richer. She will then require radiation therapy.   She denies recent palpitations.  She has been taking Toprol-XL 50 mg and rarely takes an extra 25 mg for when necessary for palpitations.     Past Medical History  Diagnosis Date  . Heart palpitations   . Osteoporosis   . Asthma   . SVT (supraventricular tachycardia)   . Numbness of arm     Right Arm  . Anemia     as a child  . Irregular heartbeat   . Anxiety   . Arthritis   . Atrial fibrillation   . Heartburn   . Hyperlipidemia   . Pure hypercholesterolemia   . Malignant neoplasm of breast (female), unspecified site   . Family history of malignant neoplasm of breast   . Invasive ductal carcinoma of right breast 02/08/14    Lower Inner Quadrant    Past Surgical History  Procedure Laterality Date  . Abdominal hysterectomy  1987  . Cystoscopy  04/25/2012    Pt may be a candidate for research anti-incontinence procedure protocol  . Breast surgery      Left  . Doppler echocardiography  08/21/2010    Ejecion Fraction =>55% All chambers are normal in size and function No significant change from 2010.  . Dipyridamole myoview  05/27/2009    The post stress myocardial perfusion images show a normal pattern of perfusion in all regions.The post stress ejection  fraction is 83 %. Normal  myocardial perfusion imaging This is a low risk scan.  [name] Upper arterial dopplers  06/02/2011    Right Side PPG Demonstrates siginificantly diminshed waveforms with the Adson's maneuver which may suggest Thoracic Outlet Syndrone. This is a abnormal thoracic outlet arterial evaluation.  . Lower venous dopplers  04/11/2009    Normal bilateral lower ext venous duplex  . 30 day monitor  04/10/2009 to 05/23/2009    No End Of Summary Report Atrial Fib,Narrow Complex Tachycardia,Sinus Rhythm     Allergies    Allergen Reactions  . Latex Swelling and Rash  . Clarithromycin   . Crestor [Rosuvastatin]     Muscle aches, pain, weakness in lower extremities  . Statins Other (See Comments)    Muscle soreness and weakness w/ Rosuvastatin and Zetia.  . Sulfonamide Derivatives   . Ciprofloxacin Hcl Palpitations    Current Outpatient Prescriptions  Medication Sig Dispense Refill  . amitriptyline (ELAVIL) 25 MG tablet Take 1 tablet (25 mg total) by mouth at bedtime.  30 tablet  3  . aspirin 81 MG tablet Take 81 mg by mouth daily.      [name] b complex vitamins tablet Take 1 tablet by mouth daily.      . celecoxib (CELEBREX) 200 MG capsule Take 1 po pc prn ha, up to 2 in one day  30 capsule  1  . chlorpheniramine-HYDROcodone (TUSSIONEX PENNKINETIC ER) 10-8 MG/5ML LQCR Take 5 mLs by mouth every 12 (twelve) hours as needed.  115 mL  0  . cholecalciferol (VITAMIN D) 1000 UNITS tablet Take 1,000 Units by mouth daily.      . flecainide (TAMBOCOR) 100 MG tablet Take 0.5 tablets (50 mg total) by mouth 2 (two) times daily. PATIENT NEEDS CHECK UP FOR ADDITIONAL REFILLS  30 tablet  0  . gabapentin (NEURONTIN) 300 MG capsule Take 1 capsule (300 mg total) by mouth 3 (three) times daily. PATIENT NEEDS OFFICE VISIT FOR ADDITIONAL REFILLS  90 capsule  0  . magnesium 30 MG tablet Take 30 mg by mouth 2 (two) times daily.      . metoprolol succinate (TOPROL-XL) 50 MG 24 hr tablet TAKE 1 TABLET (50 MG TOTAL) BY MOUTH DAILY. PATIENT NEEDS OFFICE VISIT FOR ADDITIONAL REFILLS  30 tablet  0  . ondansetron (ZOFRAN) 8 MG tablet Take 1 tablet (8 mg total) by mouth every 8 (eight) hours as needed for nausea or vomiting.  20 tablet  0  . OVER THE COUNTER MEDICATION Omega XL      . PAXIL 20 MG tablet TAKE 1 TABLET EVERY MORNING  90 tablet  1  . valACYclovir (VALTREX) 500 MG tablet Take 1 tablet (500 mg total) by mouth 2 (two) times daily as needed.  60 tablet  11   No current facility-administered medications for this visit.    Social  history is notable in that she is married and has 2 children and 3 grandchildren. 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 There is no tobacco or alcohol use.   Family History  Problem Relation Age of Onset  . Cancer Mother     skin cancer  . Diabetes Sister     Borderline  . Hypertension Sister   . Heart attack Sister   . Breast cancer Sister 57    maternal half-sister; deceased 69  . Diabetes Brother     Borderline; maternal half-brother  . Cancer Maternal Aunt  stomach and throat; heavy smoker and drinker; deceased 35  . Cancer Cousin 72    unk. primary; daughter of mat aunt with stomach/throat ca  . Cancer Other     breast/ovarian; daughter of mat cousin with unk. primary at 76; pt's 1st cousin-once-removed    ROS General: Negative; No fevers, chills, or night sweats;  HEENT: Negative; No changes in vision or hearing, sinus congestion, difficulty swallowing Pulmonary: Negative; No cough, wheezing, shortness of breath, hemoptysis Cardiovascular: Negative; No chest pain, presyncope, syncope, palpitations GI: Negative; No nausea, vomiting, diarrhea, or abdominal pain GU: Negative; No dysuria, hematuria, or difficulty voiding Musculoskeletal: Negative; no myalgias, joint pain, or weakness Hematologic/Oncology: As above, positive for invasive ductal carcinoma of the right breast no easy bruising, bleeding Endocrine: Negative; no heat/cold intolerance; no diabetes Neuro: Negative; no changes in balance, headaches Skin: Negative; No rashes or skin lesions Psychiatric: Negative; No behavioral problems, depression Sleep: Negative; No snoring, daytime sleepiness, hypersomnolence, bruxism, restless legs, hypnogognic hallucinations, no cataplexy Other comprehensive 14 point system review is negative.   PE BP 142/76  Pulse 61  Ht 5' 4"  (1.626 m)  Wt 151 lb 14.4 oz (68.901 kg)  BMI 26.06 kg/m2  LMP 07/06/1985 General: Alert,  oriented, no distress.  Skin: normal turgor, no rashes HEENT: Normocephalic, atraumatic. Pupils round and reactive; sclera anicteric; Fundi no hemorrhages or exudates arteriolar narrowing Nose without nasal septal hypertrophy Mouth/Parynx benign; Mallinpatti scale 3 Neck: No JVD, no carotid bruits with normal carotid upstroke Lungs: clear to ausculatation and percussion; no wheezing or rales Heart: RRR, s1 s2 normal 1/6 systolic murmur, no ectopy Abdomen: soft, nontender; no hepatosplenomehaly, BS+; abdominal aorta nontender and not dilated by palpation. Pulses 2+ Extremities: no clubbinbg cyanosis or edema, Homan's sign negative  Neurologic: grossly nonfocal Psychologic: Normal mood and affect  ECG (and apparently read by me and (sinus rhythm with first-degree AV block.  Nonspecific T changes V1 through V3.  Prior ECG: Sinus rhythm at 57 beats per minute, with first degree AV block with PR interval 252 ms. QTc interval is normal at 389 ms. Nonspecific T changes.  LABS:  BMET    Component Value Date/Time   NA 142 06/22/2013 1405   K 4.1 06/22/2013 1405   CL 106 06/22/2013 1405   CO2 26 06/22/2013 1405   GLUCOSE 96 06/22/2013 1405   BUN 8 06/22/2013 1405   CREATININE 0.80 06/22/2013 1405   CREATININE 0.83 12/25/2009 0208   CALCIUM 9.7 06/22/2013 1405   GFRNONAA >60 12/25/2009 0208   GFRAA  Value: >60        The eGFR has been calculated using the MDRD equation. This calculation has not been validated in all clinical situations. eGFR's persistently <60 mL/min signify possible Chronic Kidney Disease. 12/25/2009 0208     Hepatic Function Panel     Component Value Date/Time   PROT 7.0 06/22/2013 1405   ALBUMIN 4.3 06/22/2013 1405   AST 18 06/22/2013 1405   ALT 13 06/22/2013 1405   ALKPHOS 77 06/22/2013 1405   BILITOT 0.5 06/22/2013 1405     CBC    Component Value Date/Time   WBC 8.8 01/13/2014 1500   RBC 4.19 01/13/2014 1500   HGB 13.2 01/13/2014 1500   HCT 37.3 01/13/2014  1500   PLT 278 01/13/2014 1500   MCV 89.0 01/13/2014 1500   MCH 31.5 01/13/2014 1500   MCHC 35.4 01/13/2014 1500   RDW 13.7 01/13/2014 1500   LYMPHSABS 2.5 12/25/2009 0208   MONOABS 0.7  12/25/2009 0208   EOSABS 0.1 12/25/2009 0208   BASOSABS 0.0 12/25/2009 0208     BNP No results found for this basename: probnp    Lipid Panel     Component Value Date/Time   CHOL 248* 08/23/2012 1129   TRIG 103 06/22/2013 1405   TRIG 114 08/23/2012 1129   HDL 77 08/23/2012 1129   CHOLHDL 3.2 08/23/2012 1129   VLDL 23 08/23/2012 1129   LDLCALC 142* 06/22/2013 1405   LDLCALC 148* 08/23/2012 1129     RADIOLOGY: No results found.   ASSESSMENT AND PLAN:  Ms. Furno is a 63 year old African-American female who has a history of PSVT/PAF which has been managed and controlled with low-dose flecainide 50 twice a day combined with beta blocker therapy. She denies any recent chest pain. She does have nonspecific T changes on her ECG. Her last echo Doppler study was done in February 2012 which showed normal systolic and diastolic function.  She never did have her three-year followup echo Doppler study earlier this year.  She now is in need for surgery and is tentatively scheduled for lumpectomy with lymph node biopsy and oncocyte testing.  She will require radiation and chemotherapy would occur prior to radiation therapy if necessary.  I have recommended that she obtain a 2-D echo Doppler study this week to reevaluate her systolic and diastolic function as well as valvular architecture prior to any treatment of her breast cancer to establish a new baseline.  Ultimately, she will require repeat laboratory and may be need more aggressive treatment of her hyperlipidemia.  Her blood pressure today is controlled.  I did review laboratory from 2 months ago.  I will see her in 6 months for cardiology reevaluation or sooner if problems arise.  Troy Sine, MD, [facility] 03/27/2014 6:19 PM

## 2014-03-28 ENCOUNTER — Encounter (HOSPITAL_COMMUNITY): Payer: Self-pay | Admitting: Pharmacy Technician

## 2014-03-28 ENCOUNTER — Encounter (HOSPITAL_COMMUNITY)
Admission: RE | Admit: 2014-03-28 | Discharge: 2014-03-28 | Disposition: A | Discharge status: Home / Self Care | Payer: BC Managed Care – PPO | Source: Ambulatory Visit | Attending: General Surgery | Admitting: General Surgery

## 2014-03-28 ENCOUNTER — Ambulatory Visit (HOSPITAL_COMMUNITY)
Admission: RE | Admit: 2014-03-28 | Discharge: 2014-03-28 | Disposition: A | Discharge status: Home / Self Care | Payer: BC Managed Care – PPO | Source: Ambulatory Visit | Attending: Cardiovascular Disease | Admitting: Cardiovascular Disease

## 2014-03-28 ENCOUNTER — Encounter (HOSPITAL_COMMUNITY): Payer: Self-pay

## 2014-03-28 DIAGNOSIS — C50919 Malignant neoplasm of unspecified site of unspecified female breast: Secondary | ICD-10-CM

## 2014-03-28 DIAGNOSIS — E785 Hyperlipidemia, unspecified: Secondary | ICD-10-CM | POA: Insufficient documentation

## 2014-03-28 DIAGNOSIS — Z8249 Family history of ischemic heart disease and other diseases of the circulatory system: Secondary | ICD-10-CM | POA: Diagnosis not present

## 2014-03-28 DIAGNOSIS — I2510 Atherosclerotic heart disease of native coronary artery without angina pectoris: Secondary | ICD-10-CM

## 2014-03-28 DIAGNOSIS — I471 Supraventricular tachycardia: Secondary | ICD-10-CM

## 2014-03-28 HISTORY — DX: Cardiac arrhythmia, unspecified: I49.9

## 2014-03-28 LAB — PROTIME-INR
INR: 1.1 (ref 0.00–1.49)
Prothrombin Time: 14.2 seconds (ref 11.6–15.2)

## 2014-03-28 LAB — CBC WITH DIFFERENTIAL/PLATELET
BASOS ABS: 0 10*3/uL (ref 0.0–0.1)
BASOS PCT: 1 % (ref 0–1)
Eosinophils Absolute: 0 10*3/uL (ref 0.0–0.7)
Eosinophils Relative: 1 % (ref 0–5)
HCT: 36.5 % (ref 36.0–46.0)
Hemoglobin: 12.7 g/dL (ref 12.0–15.0)
Lymphocytes Relative: 38 % (ref 12–46)
Lymphs Abs: 1.7 10*3/uL (ref 0.7–4.0)
MCH: 31.1 pg (ref 26.0–34.0)
MCHC: 34.8 g/dL (ref 30.0–36.0)
MCV: 89.2 fL (ref 78.0–100.0)
Monocytes Absolute: 0.3 10*3/uL (ref 0.1–1.0)
Monocytes Relative: 8 % (ref 3–12)
NEUTROS ABS: 2.4 10*3/uL (ref 1.7–7.7)
NEUTROS PCT: 54 % (ref 43–77)
PLATELETS: 257 10*3/uL (ref 150–400)
RBC: 4.09 MIL/uL (ref 3.87–5.11)
RDW: 13 % (ref 11.5–15.5)
WBC: 4.4 10*3/uL (ref 4.0–10.5)

## 2014-03-28 LAB — COMPREHENSIVE METABOLIC PANEL
ALBUMIN: 3.8 g/dL (ref 3.5–5.2)
ALK PHOS: 85 U/L (ref 39–117)
ALT: 27 U/L (ref 0–35)
AST: 21 U/L (ref 0–37)
Anion gap: 13 (ref 5–15)
BILIRUBIN TOTAL: 0.3 mg/dL (ref 0.3–1.2)
BUN: 10 mg/dL (ref 6–23)
CHLORIDE: 106 meq/L (ref 96–112)
CO2: 25 meq/L (ref 19–32)
Calcium: 9.6 mg/dL (ref 8.4–10.5)
Creatinine, Ser: 0.77 mg/dL (ref 0.50–1.10)
GFR calc Af Amer: >90 mL/min (ref >90)
GFR, EST NON AFRICAN AMERICAN: 87 mL/min — AB (ref >90)
Glucose, Bld: 107 mg/dL — ABNORMAL HIGH (ref 70–99)
POTASSIUM: 4.1 meq/L (ref 3.7–5.3)
SODIUM: 144 meq/L (ref 137–147)
Total Protein: 7.4 g/dL (ref 6.0–8.3)

## 2014-03-28 NOTE — Addendum Note (Signed)
Encounter addended by: Deirdre Evener, RN on: 03/28/2014 11:48 AM<BR>     Documentation filed: Charges VN

## 2014-03-28 NOTE — Addendum Note (Signed)
Encounter addended by: Deirdre Evener, RN on: 03/28/2014  1:08 PM<BR>     Documentation filed: Charges VN

## 2014-03-28 NOTE — Progress Notes (Signed)
2D Echocardiogram Complete.  03/28/2014   Aqua Denslow [place], RDCS

## 2014-03-28 NOTE — Pre-Procedure Instructions (Signed)
Tami [name]  03/28/2014   Your procedure is scheduled on:  04/02/14  Report to [facility] Admitting at 1015 AM.  Call this number if you have problems the morning of surgery: [phone]   Remember:   Do not eat food or drink liquids after midnight.   Take these medicines the morning of surgery with A SIP OF WATER: neurontin,,metoprolol,paxil   Do not wear jewelry, make-up or nail polish.  Do not wear lotions, powders, or perfumes. You may wear deodorant.  Do not shave 48 hours prior to surgery. Men may shave face and neck.  Do not bring valuables to the hospital.  [facility] is not responsible                  for any belongings or valuables.               Contacts, dentures or bridgework may not be worn into surgery.  Leave suitcase in the car. After surgery it may be brought to your room.  For patients admitted to the hospital, discharge time is determined by your                treatment team.               Patients discharged the day of surgery will not be allowed to drive  home.  Name and phone number of your driver:   Special Instructions: Shower using CHG 2 nights before surgery and the night before surgery.  If you shower the day of surgery use CHG.  Use special wash - you have one bottle of CHG for all showers.  You should use approximately 1/3 of the bottle for each shower.   Please read over the following fact sheets that you were given: Pain Booklet, Coughing and Deep Breathing and Surgical Site Infection Prevention

## 2014-03-29 ENCOUNTER — Inpatient Hospital Stay (HOSPITAL_COMMUNITY): Admission: RE | Admit: 2014-03-29 | Payer: BC Managed Care – PPO | Source: Ambulatory Visit

## 2014-03-29 ENCOUNTER — Telehealth (INDEPENDENT_AMBULATORY_CARE_PROVIDER_SITE_OTHER): Payer: Self-pay

## 2014-03-29 NOTE — Progress Notes (Signed)
Anesthesia Chart Review: Patient is a 63 year old female scheduled for right breast lumpectomy after wire localization, right axillary SN LN biopsy on 04/02/14 by dr. Zella Richer.  History includes non-smoker, SVT/PAF/heart palpitations, asthma, anemia, anxiety, arthritis, hypercholesterolemia, right breast cancer, hysterectomy. Cardiologist is Dr. Claiborne Billings, last visit 03/27/14. He was aware of plans for surgery and anticipated need for chemoradiation and ordered an echocardiogram which showed NL LVEF and wall motion. PCP is listed as Dr. Joseph Art.  EKG on 03/27/14 showed: SR with first degree AVB, non-specific T wave abnormality in anterior leads.  Echo on 03/28/14 showed: Left ventricle: The cavity size was normal. Wall thickness was normal. Systolic function was normal. The estimated ejection fraction was in the range of 60% to 65%. Trivial TR.  Nuclear stress test on 05/27/09 showed: Normal myocardial perfusion imaging. Post stress EF 83%. Global LV systolic function is normal. No significant wall motion abnormalities noted.   Preoperative CXR and labs noted.   If no acute changes then I anticipate that she can proceed as planned.  [facility] Stay [facility]/Anesthesiology Phone [phone] 03/29/2014 1:33 PM

## 2014-03-29 NOTE — Telephone Encounter (Signed)
Pt is scheduled for lumpectomy on 04/02/14. Pt wanted to know if Dr Zella Richer would give her something for her anxiety that she is having before sx. Informed pt that I would send Dr Zella Richer and we will contact her as soon as we receive a message. Pt verbalized understanding

## 2014-04-01 MED ORDER — CEFAZOLIN SODIUM-DEXTROSE 2-3 GM-% IV SOLR
2.0000 g | INTRAVENOUS | Status: AC
  Administered 2014-04-02: 2 g via INTRAVENOUS
  Filled 2014-04-01: qty 50

## 2014-04-02 ENCOUNTER — Ambulatory Visit (HOSPITAL_COMMUNITY): Payer: BC Managed Care – PPO | Admitting: Certified Registered"

## 2014-04-02 ENCOUNTER — Ambulatory Visit
Admit: 2014-04-02 | Discharge: 2014-04-02 | Disposition: A | Discharge status: Home / Self Care | Payer: BC Managed Care – PPO | Attending: General Surgery | Admitting: General Surgery

## 2014-04-02 ENCOUNTER — Ambulatory Visit
Admission: RE | Admit: 2014-04-02 | Discharge: 2014-04-02 | Disposition: A | Discharge status: Home / Self Care | Payer: BC Managed Care – PPO | Source: Ambulatory Visit | Attending: General Surgery | Admitting: General Surgery

## 2014-04-02 ENCOUNTER — Encounter (HOSPITAL_COMMUNITY)
Admission: RE | Disposition: A | Discharge status: Home / Self Care | Payer: Self-pay | Source: Ambulatory Visit | Attending: General Surgery

## 2014-04-02 ENCOUNTER — Encounter (HOSPITAL_COMMUNITY)
Admission: RE | Admit: 2014-04-02 | Discharge: 2014-04-02 | Disposition: A | Discharge status: Home / Self Care | Payer: BC Managed Care – PPO | Source: Ambulatory Visit | Attending: General Surgery | Admitting: General Surgery

## 2014-04-02 ENCOUNTER — Ambulatory Visit (HOSPITAL_COMMUNITY)
Admission: RE | Admit: 2014-04-02 | Discharge: 2014-04-02 | Disposition: A | Discharge status: Home / Self Care | Payer: BC Managed Care – PPO | Source: Ambulatory Visit | Attending: General Surgery | Admitting: General Surgery

## 2014-04-02 ENCOUNTER — Encounter (HOSPITAL_COMMUNITY): Payer: Self-pay | Admitting: Certified Registered"

## 2014-04-02 ENCOUNTER — Encounter (HOSPITAL_COMMUNITY): Payer: BC Managed Care – PPO | Admitting: Vascular Surgery

## 2014-04-02 DIAGNOSIS — Z803 Family history of malignant neoplasm of breast: Secondary | ICD-10-CM | POA: Insufficient documentation

## 2014-04-02 DIAGNOSIS — Z7982 Long term (current) use of aspirin: Secondary | ICD-10-CM | POA: Diagnosis not present

## 2014-04-02 DIAGNOSIS — I4891 Unspecified atrial fibrillation: Secondary | ICD-10-CM | POA: Insufficient documentation

## 2014-04-02 DIAGNOSIS — E7800 Pure hypercholesterolemia, unspecified: Secondary | ICD-10-CM | POA: Insufficient documentation

## 2014-04-02 DIAGNOSIS — F411 Generalized anxiety disorder: Secondary | ICD-10-CM | POA: Diagnosis not present

## 2014-04-02 DIAGNOSIS — C50319 Malignant neoplasm of lower-inner quadrant of unspecified female breast: Secondary | ICD-10-CM | POA: Insufficient documentation

## 2014-04-02 DIAGNOSIS — K219 Gastro-esophageal reflux disease without esophagitis: Secondary | ICD-10-CM | POA: Insufficient documentation

## 2014-04-02 DIAGNOSIS — I10 Essential (primary) hypertension: Secondary | ICD-10-CM | POA: Diagnosis not present

## 2014-04-02 DIAGNOSIS — Z888 Allergy status to other drugs, medicaments and biological substances status: Secondary | ICD-10-CM | POA: Insufficient documentation

## 2014-04-02 DIAGNOSIS — Z881 Allergy status to other antibiotic agents status: Secondary | ICD-10-CM | POA: Insufficient documentation

## 2014-04-02 DIAGNOSIS — Z79899 Other long term (current) drug therapy: Secondary | ICD-10-CM | POA: Insufficient documentation

## 2014-04-02 DIAGNOSIS — C50919 Malignant neoplasm of unspecified site of unspecified female breast: Secondary | ICD-10-CM | POA: Diagnosis present

## 2014-04-02 DIAGNOSIS — C50911 Malignant neoplasm of unspecified site of right female breast: Secondary | ICD-10-CM

## 2014-04-02 DIAGNOSIS — Z91040 Latex allergy status: Secondary | ICD-10-CM | POA: Insufficient documentation

## 2014-04-02 DIAGNOSIS — Z882 Allergy status to sulfonamides status: Secondary | ICD-10-CM | POA: Insufficient documentation

## 2014-04-02 DIAGNOSIS — J45909 Unspecified asthma, uncomplicated: Secondary | ICD-10-CM | POA: Diagnosis not present

## 2014-04-02 HISTORY — PX: BREAST LUMPECTOMY WITH NEEDLE LOCALIZATION AND AXILLARY SENTINEL LYMPH NODE BX: SHX5760

## 2014-04-02 SURGERY — BREAST LUMPECTOMY WITH NEEDLE LOCALIZATION AND AXILLARY SENTINEL LYMPH NODE BX
Anesthesia: General | Site: Breast | Laterality: Right

## 2014-04-02 MED ORDER — FENTANYL CITRATE 0.05 MG/ML IJ SOLN
INTRAMUSCULAR | Status: AC
  Filled 2014-04-02: qty 5

## 2014-04-02 MED ORDER — FENTANYL CITRATE 0.05 MG/ML IJ SOLN
INTRAMUSCULAR | Status: DC | PRN
  Administered 2014-04-02: 50 ug via INTRAVENOUS

## 2014-04-02 MED ORDER — LIDOCAINE HCL (CARDIAC) 20 MG/ML IV SOLN
INTRAVENOUS | Status: AC
  Filled 2014-04-02: qty 5

## 2014-04-02 MED ORDER — OXYCODONE HCL 5 MG PO TABS: 5.0000 mg | ORAL_TABLET | Freq: Once | ORAL | Status: DC | PRN

## 2014-04-02 MED ORDER — DEXAMETHASONE SODIUM PHOSPHATE 4 MG/ML IJ SOLN
INTRAMUSCULAR | Status: DC | PRN
  Administered 2014-04-02: 4 mg via INTRAVENOUS

## 2014-04-02 MED ORDER — PROPOFOL 10 MG/ML IV BOLUS
INTRAVENOUS | Status: DC | PRN
  Administered 2014-04-02: 140 mg via INTRAVENOUS

## 2014-04-02 MED ORDER — FENTANYL CITRATE 0.05 MG/ML IJ SOLN
INTRAMUSCULAR | Status: AC
  Administered 2014-04-02: 50 ug
  Filled 2014-04-02: qty 2

## 2014-04-02 MED ORDER — HYDROMORPHONE HCL 1 MG/ML IJ SOLN
INTRAMUSCULAR | Status: AC
  Filled 2014-04-02: qty 1

## 2014-04-02 MED ORDER — ONDANSETRON HCL 4 MG/2ML IJ SOLN
INTRAMUSCULAR | Status: DC | PRN
  Administered 2014-04-02: 4 mg via INTRAVENOUS

## 2014-04-02 MED ORDER — PROMETHAZINE HCL 25 MG/ML IJ SOLN: 6.2500 mg | INTRAMUSCULAR | Status: DC | PRN

## 2014-04-02 MED ORDER — DEXAMETHASONE SODIUM PHOSPHATE 4 MG/ML IJ SOLN
INTRAMUSCULAR | Status: AC
  Filled 2014-04-02: qty 1

## 2014-04-02 MED ORDER — EPHEDRINE SULFATE 50 MG/ML IJ SOLN
INTRAMUSCULAR | Status: DC | PRN
  Administered 2014-04-02 (×4): 10 mg via INTRAVENOUS

## 2014-04-02 MED ORDER — BUPIVACAINE HCL (PF) 0.25 % IJ SOLN
INTRAMUSCULAR | Status: AC
  Filled 2014-04-02: qty 30

## 2014-04-02 MED ORDER — SCOPOLAMINE 1 MG/3DAYS TD PT72
MEDICATED_PATCH | TRANSDERMAL | Status: AC
  Filled 2014-04-02: qty 1

## 2014-04-02 MED ORDER — LACTATED RINGERS IV SOLN
INTRAVENOUS | Status: DC
  Administered 2014-04-02: 50 mL/h via INTRAVENOUS
  Administered 2014-04-02: 13:00:00 via INTRAVENOUS

## 2014-04-02 MED ORDER — SODIUM CHLORIDE 0.9 % IJ SOLN
INTRAMUSCULAR | Status: AC
  Filled 2014-04-02: qty 3

## 2014-04-02 MED ORDER — ONDANSETRON HCL 4 MG/2ML IJ SOLN
INTRAMUSCULAR | Status: AC
  Filled 2014-04-02: qty 2

## 2014-04-02 MED ORDER — METHYLENE BLUE 1 % INJ SOLN
INTRAMUSCULAR | Status: AC
  Filled 2014-04-02: qty 10

## 2014-04-02 MED ORDER — MIDAZOLAM HCL 2 MG/2ML IJ SOLN
INTRAMUSCULAR | Status: AC
  Filled 2014-04-02: qty 2

## 2014-04-02 MED ORDER — BUPIVACAINE HCL 0.5 % IJ SOLN
INTRAMUSCULAR | Status: AC
  Filled 2014-04-02: qty 1

## 2014-04-02 MED ORDER — BUPIVACAINE-EPINEPHRINE (PF) 0.5% -1:200000 IJ SOLN
INTRAMUSCULAR | Status: DC | PRN
Start: 2014-04-02 — End: 2014-04-02
  Administered 2014-04-02: 30 mL

## 2014-04-02 MED ORDER — TECHNETIUM TC 99M SULFUR COLLOID FILTERED: 1.0000 | Freq: Once | INTRAVENOUS | Status: DC | PRN

## 2014-04-02 MED ORDER — 0.9 % SODIUM CHLORIDE (POUR BTL) OPTIME
TOPICAL | Status: DC | PRN
  Administered 2014-04-02: 1000 mL

## 2014-04-02 MED ORDER — BUPIVACAINE HCL (PF) 0.5 % IJ SOLN
INTRAMUSCULAR | Status: AC
  Filled 2014-04-02: qty 10

## 2014-04-02 MED ORDER — ONDANSETRON HCL 4 MG PO TABS: 4.0000 mg | ORAL_TABLET | ORAL | Status: DC | PRN

## 2014-04-02 MED ORDER — OXYCODONE HCL 5 MG/5ML PO SOLN: 5.0000 mg | Freq: Once | ORAL | Status: DC | PRN

## 2014-04-02 MED ORDER — PROPOFOL 10 MG/ML IV BOLUS
INTRAVENOUS | Status: AC
  Filled 2014-04-02: qty 20

## 2014-04-02 MED ORDER — SCOPOLAMINE 1 MG/3DAYS TD PT72
MEDICATED_PATCH | TRANSDERMAL | Status: DC | PRN
  Administered 2014-04-02: 1 via TRANSDERMAL

## 2014-04-02 MED ORDER — HYDROCODONE-ACETAMINOPHEN 5-325 MG PO TABS: 1.0000 | ORAL_TABLET | ORAL | Status: DC | PRN

## 2014-04-02 MED ORDER — BUPIVACAINE HCL 0.5 % IJ SOLN
INTRAMUSCULAR | Status: DC | PRN
  Administered 2014-04-02: 10 mL

## 2014-04-02 MED ORDER — MIDAZOLAM HCL 2 MG/2ML IJ SOLN
INTRAMUSCULAR | Status: AC
  Administered 2014-04-02: 0.5 mg
  Filled 2014-04-02: qty 2

## 2014-04-02 MED ORDER — HYDROMORPHONE HCL 1 MG/ML IJ SOLN
0.2500 mg | INTRAMUSCULAR | Status: DC | PRN
Start: 2014-04-02 — End: 2014-04-02
  Administered 2014-04-02: 0.5 mg via INTRAVENOUS

## 2014-04-02 SURGICAL SUPPLY — 49 items
BENZOIN TINCTURE PRP APPL 2/3 (GAUZE/BANDAGES/DRESSINGS) ×3 IMPLANT
BINDER BREAST LRG (GAUZE/BANDAGES/DRESSINGS) IMPLANT
BINDER BREAST XLRG (GAUZE/BANDAGES/DRESSINGS) ×3 IMPLANT
BLADE SURG 10 STRL SS (BLADE) ×3 IMPLANT
BLADE SURG 15 STRL LF DISP TIS (BLADE) ×1 IMPLANT
BLADE SURG 15 STRL SS (BLADE) ×2
CANISTER SUCTION 2500CC (MISCELLANEOUS) ×3 IMPLANT
CHLORAPREP W/TINT 26ML (MISCELLANEOUS) ×3 IMPLANT
CLIP TI MEDIUM 6 (CLIP) ×3 IMPLANT
CLOSURE WOUND 1/2 X4 (GAUZE/BANDAGES/DRESSINGS) ×1
CONT SPEC 4OZ CLIKSEAL STRL BL (MISCELLANEOUS) ×9 IMPLANT
COVER PROBE W GEL 5X96 (DRAPES) ×3 IMPLANT
COVER SURGICAL LIGHT HANDLE (MISCELLANEOUS) ×3 IMPLANT
DECANTER SPIKE VIAL GLASS SM (MISCELLANEOUS) ×3 IMPLANT
DEVICE DUBIN SPECIMEN MAMMOGRA (MISCELLANEOUS) ×3 IMPLANT
DRAPE PED LAPAROTOMY (DRAPES) ×3 IMPLANT
DRAPE UTILITY 15X26 W/TAPE STR (DRAPE) ×6 IMPLANT
DRSG OPSITE 4X5.5 SM (GAUZE/BANDAGES/DRESSINGS) ×3 IMPLANT
ELECT CAUTERY BLADE 6.4 (BLADE) ×3 IMPLANT
ELECT REM PT RETURN 9FT ADLT (ELECTROSURGICAL) ×3
ELECTRODE REM PT RTRN 9FT ADLT (ELECTROSURGICAL) ×1 IMPLANT
GAUZE SPONGE 4X4 12PLY STRL (GAUZE/BANDAGES/DRESSINGS) ×3 IMPLANT
GLOVE BIOGEL PI IND STRL 8 (GLOVE) ×1 IMPLANT
GLOVE BIOGEL PI INDICATOR 8 (GLOVE) ×2
GLOVE ECLIPSE 8.0 STRL XLNG CF (GLOVE) IMPLANT
GOWN STRL REUS W/ TWL LRG LVL3 (GOWN DISPOSABLE) ×2 IMPLANT
GOWN STRL REUS W/TWL LRG LVL3 (GOWN DISPOSABLE) ×4
KIT BASIN OR (CUSTOM PROCEDURE TRAY) ×3 IMPLANT
KIT ROOM TURNOVER OR (KITS) ×3 IMPLANT
NEEDLE 18GX1X1/2 (RX/OR ONLY) (NEEDLE) IMPLANT
NEEDLE HYPO 25GX1X1/2 BEV (NEEDLE) ×3 IMPLANT
NS IRRIG 1000ML POUR BTL (IV SOLUTION) ×3 IMPLANT
PACK SURGICAL SETUP 50X90 (CUSTOM PROCEDURE TRAY) ×3 IMPLANT
PAD ARMBOARD 7.5X6 YLW CONV (MISCELLANEOUS) ×6 IMPLANT
PENCIL BUTTON HOLSTER BLD 10FT (ELECTRODE) ×3 IMPLANT
SPONGE GAUZE 4X4 12PLY STER LF (GAUZE/BANDAGES/DRESSINGS) ×3 IMPLANT
SPONGE LAP 4X18 X RAY DECT (DISPOSABLE) ×3 IMPLANT
STRIP CLOSURE SKIN 1/2X4 (GAUZE/BANDAGES/DRESSINGS) ×2 IMPLANT
SUT MNCRL AB 4-0 PS2 18 (SUTURE) ×3 IMPLANT
SUT SILK 3 0 SH CR/8 (SUTURE) ×3 IMPLANT
SUT VIC AB 3-0 SH 18 (SUTURE) ×6 IMPLANT
SYR CONTROL 10ML LL (SYRINGE) ×3 IMPLANT
TAPE CLOTH SURG 6X10 WHT LF (GAUZE/BANDAGES/DRESSINGS) ×3 IMPLANT
TOWEL OR 17X24 6PK STRL BLUE (TOWEL DISPOSABLE) ×3 IMPLANT
TOWEL OR 17X26 10 PK STRL BLUE (TOWEL DISPOSABLE) IMPLANT
TUBE CONNECTING 12'X1/4 (SUCTIONS) ×1
TUBE CONNECTING 12X1/4 (SUCTIONS) ×2 IMPLANT
WATER STERILE IRR 1000ML POUR (IV SOLUTION) IMPLANT
YANKAUER SUCT BULB TIP NO VENT (SUCTIONS) ×3 IMPLANT

## 2014-04-02 NOTE — Anesthesia Procedure Notes (Addendum)
Anesthesia Regional Block:  Pectoralis block  Pre-Anesthetic Checklist: ,, timeout performed, Correct Patient, Correct Site, Correct Laterality, Correct Procedure, Correct Position, site marked, Risks and benefits discussed,  Surgical consent,  Pre-op evaluation,  At surgeon's request and post-op pain management  Laterality: Right  Prep: chloraprep       Needles:  Injection technique: Single-shot  Needle Type: Echogenic Needle     Needle Length: 9cm 9 cm Needle Gauge: 21 and 21 G  Needle insertion depth: 6 cm   Additional Needles:  Procedures: ultrasound guided (picture in chart) Pectoralis block Narrative:  Start time: 04/02/2014 11:10 AM End time: 04/02/2014 11:25 AM Injection made incrementally with aspirations every 5 mL.  Performed by: Personally  Anesthesiologist: Suzette Battiest, MD  Additional Notes: Risks and benefits explained to the pt. Pt prepped and procedure performed in sterile fashion. Pt tolerated procedure will without complications.   Procedure Name: LMA Insertion Date/Time: 04/02/2014 12:00 PM Performed by: Julian Reil Pre-anesthesia Checklist: Patient identified, Emergency Drugs available, Suction available and Patient being monitored Patient Re-evaluated:Patient Re-evaluated prior to inductionOxygen Delivery Method: Circle system utilized Preoxygenation: Pre-oxygenation with 100% oxygen Intubation Type: IV induction Ventilation: Mask ventilation without difficulty LMA: LMA inserted LMA Size: 4.0 Tube type: Oral Number of attempts: 1 Placement Confirmation: positive ETCO2 and breath sounds checked- equal and bilateral Tube secured with: Tape Dental Injury: Teeth and Oropharynx as per pre-operative assessment

## 2014-04-02 NOTE — Anesthesia Preprocedure Evaluation (Addendum)
Anesthesia Evaluation  Patient identified by MRN, date of birth, ID band Patient awake    Reviewed: Allergy & Precautions, H&P , NPO status , Patient's Chart, lab work & pertinent test results  Airway Mallampati: III TM Distance: >3 FB Neck ROM: Full    Dental  (+) Dental Advisory Given   Pulmonary asthma ,  breath sounds clear to auscultation        Cardiovascular hypertension, + dysrhythmias Rhythm:Regular Rate:Normal     Neuro/Psych Anxiety negative neurological ROS     GI/Hepatic Neg liver ROS, GERD-  ,  Endo/Other  negative endocrine ROS  Renal/GU negative Renal ROS     Musculoskeletal  (+) Arthritis -,   Abdominal   Peds  Hematology negative hematology ROS (+)   Anesthesia Other Findings   Reproductive/Obstetrics                          Anesthesia Physical Anesthesia Plan  ASA: II  Anesthesia Plan: General   Post-op Pain Management:    Induction: Intravenous  Airway Management Planned: LMA  Additional Equipment: None  Intra-op Plan:   Post-operative Plan:   Informed Consent: I have reviewed the patients History and Physical, chart, labs and discussed the procedure including the risks, benefits and alternatives for the proposed anesthesia with the patient or authorized representative who has indicated his/her understanding and acceptance.   Dental advisory given  Plan Discussed with: CRNA and Surgeon  Anesthesia Plan Comments:         Anesthesia Quick Evaluation

## 2014-04-02 NOTE — Progress Notes (Signed)
Report given to rebecca slade rn as caregiver

## 2014-04-02 NOTE — Transfer of Care (Signed)
Immediate Anesthesia Transfer of Care Note  Patient: Tami [name]  Procedure(s) Performed: Procedure(s): Right Axillary Lymphatic Mapping; Right axillary Sentinal Node Biopsy; Right Partial Mastectomy after wire localization (Right)  Patient Location: PACU  Anesthesia Type:GA combined with regional for post-op pain  Level of Consciousness: awake, alert , oriented and patient cooperative  Airway & Oxygen Therapy: Patient Spontanous Breathing and Patient connected to nasal cannula oxygen  Post-op Assessment: Report given to PACU RN, Post -op Vital signs reviewed and stable and Patient moving all extremities  Post vital signs: Reviewed and stable  Complications: No apparent anesthesia complications

## 2014-04-02 NOTE — Op Note (Signed)
Operative Note  Tami [name] female 63 y.o. 04/02/2014  PREOPERATIVE DX:  Invasive right breast cancer lower inner quadrant POSTOPERATIVE DX:  Same  PROCEDURE:   Right axillary lymphatic mapping. Right axillary sentinel lymph node biopsy (3 lymph nodes). Right partial mastectomy after wire localization.         Surgeon: [name]   Assistants: none  Anesthesia: General LMA anesthesia  Indications:   This is a 63 year old female with a mammographic abnormality in the right breast. Image data biopsy is positive for invasive cancer. She now presents for the above procedure.    Procedure Detail:  She underwent successful wire localization at the breast center. She had injection of radioactive material in the right periareolar area in the holding room. The right breast was marked with my initials. She was brought to the operating room placed supine on the operating table and general anesthetic was given.  The bandage on the right breast was removed and the wire was cut close to the skin. Right axillary mapping with the neoprobe was performed an area of increased counts was noted. The right breast and axilla were sterilely prepped and draped.    A transverse incision was made in the lower axillary region and the subcutaneous tissue divided with the Bovie. Using the neoprobe I localized an area of increased counts and could palpate 2 lymph nodes in this region both of which appeared to have increased counts. Using electrocautery these were removed and labeled sentinel lymph nodes 1 and 2 together and the packet. I placed the neoprobe back into the wound and another area of increased counts as noted. I removed another lymph node with increased counts and labeled as sentinel lymph node #3. Small lymphatics and vessels were clipped. The neoprobe was placed back into the wound and no other areas of increased counts are noted. The wound was anesthetized with 0.5% plain Marcaine. Hemostasis was  adequate. The subcutaneous tissues and closed with interrupted 3-0 Vicryl sutures. The skin is close with 4-0 Monocryl subcuticular stitch.  Next a transverse incision was made in the lower inner quadrant of the right breast. The wire was brought into the incision.  I then performed a lumpectomy of tissue around the wire to try to get good gross margins. The anterior aspect of the specimen was marked with a suture and the wire was in the medial aspect.  This went down to the chest wall. I then did shave margins in the superior, inferior, lateral, and medial margin areas and oriented them with sutures. Specimen mammogram was performed in the area of concern as well as the clip were contained within the specimen. All specimens were sent to pathology. Bleeding was controlled with electrocautery. Thr wound was anesthetized with 0.5% plain Marcaine.  Hemostasis was adequate. The subcutaneous tissues were closed with interrupted 3-0 Vicryl sutures. The skin was close with 4-0 Monocryl subcuticular stitch. Steri-Strips were then applied to both wounds followed by sterile dressings and a breast binder.  She tolerated the procedures well without any apparent complications and was taken to the recovery room in satisfactory condition.   Estimated Blood Loss:  150 ml         Drains: none         Specimens: right axillary sentinel lymph nodes. Right breast tissue.        Complications:  * No complications entered in OR log *         Disposition: PACU - hemodynamically stable.  Condition: stable

## 2014-04-02 NOTE — H&P (Signed)
Tami [name] is an 63 y.o. female.   Chief Complaint:   Here for elective surgery HPI:   She was noted to have an abnormality on mammogram in the right breast at the 4:00 position 7 cm from nipple. 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 Proliferation rate 17%. Her sister had breast cancer and died from it. She was in her 69s. She also has a niece with breast cancer. Age at menarche was 38 First live birth was at approximately 63 years of age. She took hormone replacement therapy for 5 or 6 years. She had a benign biopsy of the left breast many years ago.  Genetic testing was negative.  She has been seen by medical oncology and radiation oncology and felt to be a appropriate candidate for breast conservation surgery.  She had a preoperative visit with her cardiologist and is not at increased risk for a perioperative cardiac event.   Past Medical History  Diagnosis Date  . Heart palpitations   . Osteoporosis   . Asthma   . SVT (supraventricular tachycardia)   . Numbness of arm     Right Arm  . Anemia     as a child  . Irregular heartbeat   . Anxiety   . Arthritis   . Atrial fibrillation   . Heartburn   . Hyperlipidemia   . Pure hypercholesterolemia   . Malignant neoplasm of breast (female), unspecified site   . Family history of malignant neoplasm of breast   . 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 Dysrhythmia     hx AF    Past Surgical History  Procedure Laterality Date  . Abdominal hysterectomy  1987  . Cystoscopy  04/25/2012    Pt may be a candidate for research anti-incontinence procedure protocol  . Doppler echocardiography  08/21/2010    Ejecion Fraction =>55% All chambers are normal in size and function No significant change from 2010.  . Dipyridamole myoview  05/27/2009    The post stress myocardial perfusion images show a normal pattern of perfusion in all regions.The post stress ejection  fraction is 83 %. Normal  myocardial perfusion imaging This is a low risk scan.  [name] Upper arterial dopplers  06/02/2011    Right Side PPG Demonstrates siginificantly diminshed waveforms with the Adson's maneuver which may suggest Thoracic Outlet Syndrone. This is a abnormal thoracic outlet arterial evaluation.  . Lower venous dopplers  04/11/2009    Normal bilateral lower ext venous duplex  . 30 day monitor  04/10/2009 to 05/23/2009    No End Of Summary Report Atrial Fib,Narrow Complex Tachycardia,Sinus Rhythm   . Breast surgery      Left    Family History  Problem Relation Age of Onset  . Cancer Mother     skin cancer  . Diabetes Sister     Borderline  . Hypertension Sister   . Heart attack Sister   . Breast cancer Sister 55    maternal half-sister; deceased 60  . Diabetes Brother     Borderline; maternal half-brother  . Cancer Maternal Aunt     stomach and throat; heavy smoker and drinker; deceased 69  . Cancer Cousin 72    unk. primary; daughter of mat aunt with stomach/throat ca  . Cancer Other     breast/ovarian; daughter of mat cousin with unk. primary at 32; pt's 1st cousin-once-removed   Social History:  reports that she has never smoked.  She has never used smokeless tobacco. She reports that she does not drink alcohol or use illicit drugs.  Allergies:  Allergies  Allergen Reactions  . Latex Swelling and Rash  . Clarithromycin   . Crestor [Rosuvastatin]     Muscle aches, pain, weakness in lower extremities  . Statins Other (See Comments)    Muscle soreness and weakness w/ Rosuvastatin and Zetia.  . Sulfonamide Derivatives   . Ciprofloxacin Hcl Palpitations    Medications Prior to Admission  Medication Sig Dispense Refill  . aspirin 81 MG tablet Take 81 mg by mouth daily.      [name] b complex vitamins tablet Take 1 tablet by mouth daily.      . celecoxib (CELEBREX) 200 MG capsule Take 200 mg by mouth 2 (two) times daily as needed for moderate pain.      .  cholecalciferol (VITAMIN D) 1000 UNITS tablet Take 1,000 Units by mouth daily.      . flecainide (TAMBOCOR) 100 MG tablet Take 0.5 tablets (50 mg total) by mouth 2 (two) times daily. PATIENT NEEDS CHECK UP FOR ADDITIONAL REFILLS  30 tablet  0  . gabapentin (NEURONTIN) 300 MG capsule Take 1 capsule (300 mg total) by mouth 3 (three) times daily. PATIENT NEEDS OFFICE VISIT FOR ADDITIONAL REFILLS  90 capsule  0  . Magnesium 250 MG TABS Take 250 mg by mouth daily.      . metoprolol succinate (TOPROL-XL) 50 MG 24 hr tablet Take 50 mg by mouth daily. Take with or immediately following a meal.      . Omega-3 Fatty Acids (OMEGA 3 PO) Take 2 capsules by mouth 2 (two) times daily.      [name] PARoxetine (PAXIL) 20 MG tablet Take 20 mg by mouth daily.      [name] amitriptyline (ELAVIL) 25 MG tablet Take 25 mg by mouth at bedtime as needed for sleep. When patient has migraines      . ondansetron (ZOFRAN) 8 MG tablet Take 1 tablet (8 mg total) by mouth every 8 (eight) hours as needed for nausea or vomiting.  20 tablet  0  . valACYclovir (VALTREX) 500 MG tablet Take 1 tablet (500 mg total) by mouth 2 (two) times daily as needed.  60 tablet  11    No results found for this or any previous visit (from the past 48 hour(s)). No results found.  Review of Systems  Constitutional: Negative.     Blood pressure 171/73, pulse 56, temperature 98.4 F (36.9 C), temperature source Oral, resp. rate 20, height 5\' 4"  (1.626 m), weight 151 lb (68.493 kg), last menstrual period 07/06/1985, SpO2 100.00%. Physical Exam  Constitutional: She appears well-developed and well-nourished. No distress.  HENT:  Head: Normocephalic and atraumatic.  Cardiovascular: Normal rate and regular rhythm.   Respiratory: Effort normal and breath sounds normal.  Bandage on right breast.  GI: Soft. There is no tenderness.  Musculoskeletal: She exhibits no edema.  Lymphadenopathy:    She has no cervical adenopathy.  Neurological: She is alert.  Skin:  Skin is warm and dry.     Assessment/Plan Invasive right breast cancer  Plan:  Right partial mastectomy after wire localization, right axillary sentinel lymph node biopsy.  Grant Swager J 04/02/2014, 10:48 AM

## 2014-04-02 NOTE — Discharge Instructions (Addendum)
[facility] Office Phone Number [phone]  BREAST BIOPSY/ PARTIAL MASTECTOMY: POST OP INSTRUCTIONS  Always review your discharge instruction sheet given to you by the facility where your surgery was performed.  IF YOU HAVE DISABILITY OR FAMILY LEAVE FORMS, YOU MUST BRING THEM TO THE OFFICE FOR PROCESSING.  DO NOT GIVE THEM TO YOUR DOCTOR.  1. A prescription for pain medication may be given to you upon discharge.  Take your pain medication as prescribed, if needed.  If narcotic pain medicine is not needed, then you may take acetaminophen (Tylenol) or ibuprofen (Advil) as needed. 2. Take your usually prescribed medications unless otherwise directed 3. If you need a refill on your pain medication, please contact your pharmacy.  They will contact our office to request authorization.  Prescriptions will not be filled after 5pm or on week-ends. 4. You should eat very light the first 24 hours after surgery, such as soup, crackers, pudding, etc.  Resume your normal diet the day after surgery. 5. Most patients will experience some swelling and bruising in the breast.  Ice packs and a good support bra will help.  Swelling and bruising can take several days to resolve.  6. It is common to experience some constipation if taking pain medication after surgery.  Increasing fluid intake and taking a stool softener will usually help or prevent this problem from occurring.  A mild laxative (Milk of Magnesia or Miralax) should be taken according to package directions if there are no bowel movements after 48 hours. 7. Unless discharge instructions indicate otherwise, you may remove your bandages 48 hours after surgery, and you may shower at that time.  You may have steri-strips (small skin tapes) in place directly over the incision.  These strips should be left on the skin for 14 days.  If your surgeon used skin glue on the incision, you may shower in 24 hours.  The glue will flake off over the next  2-3 weeks.  Any sutures or staples will be removed at the office during your follow-up visit. 8. ACTIVITIES:  Light activities with right arm for one week.  May resume normal activities after that time if pain-free. a. You may drive when you no longer are taking prescription pain medication, you can comfortably wear a seatbelt, and you can safely maneuver your car and apply brakes. b. RETURN TO WORK:  ______________________________________________________________________________________ 9. You should see your doctor in the office for a follow-up appointment approximately 2-3 weeks after your surgery.  Please call for this appointment. 10. OTHER INSTRUCTIONS: _______________________________________________________________________________________________ _____________________________________________________________________________________________________________________________________ _____________________________________________________________________________________________________________________________________ _____________________________________________________________________________________________________________________________________  WHEN TO CALL YOUR DOCTOR: 1. Fever over 101.0 2. Nausea and/or vomiting. 3. Extreme swelling or bruising. 4. Continued bleeding from incision. 5. Increased pain, redness, or drainage from the incision.  The clinic staff is available to answer your questions during regular business hours.  Please dont hesitate to call and ask to speak to one of the nurses for clinical concerns.  If you have a medical emergency, go to the nearest emergency room or call 911.  A surgeon from [facility] is always on call at the hospital.  For further questions, please visit [email or web address]   What to eat:  For your first meals, you should eat lightly; only small meals initially.  If you do not have nausea, you may eat larger meals.  Avoid spicy,  greasy and heavy food.    General Anesthesia, Adult, Care After  Refer to this sheet in the next few weeks. These instructions  provide you with information on caring for yourself after your procedure. Your health care provider may also give you more specific instructions. Your treatment has been planned according to current medical practices, but problems sometimes occur. Call your health care provider if you have any problems or questions after your procedure.  WHAT TO EXPECT AFTER THE PROCEDURE  After the procedure, it is typical to experience:  Sleepiness.  Nausea and vomiting. HOME CARE INSTRUCTIONS  For the first 24 hours after general anesthesia:  Have a responsible person with you.  Do not drive a car. If you are alone, do not take public transportation.  Do not drink alcohol.  Do not take medicine that has not been prescribed by your health care provider.  Do not sign important papers or make important decisions.  You may resume a normal diet and activities as directed by your health care provider.  Change bandages (dressings) as directed.  If you have questions or problems that seem related to general anesthesia, call the hospital and ask for the anesthetist or anesthesiologist on call. SEEK MEDICAL CARE IF:  You have nausea and vomiting that continue the day after anesthesia.  You develop a rash. SEEK IMMEDIATE MEDICAL CARE IF:  You have difficulty breathing.  You have chest pain.  You have any allergic problems. Document Released: 09/27/2000 Document Revised: 02/21/2013 Document Reviewed: 01/04/2013  [facility] Patient Information 2014 [place], [place].

## 2014-04-02 NOTE — Anesthesia Postprocedure Evaluation (Signed)
  Anesthesia Post-op Note  Patient: Tami [name]  Procedure(s) Performed: Procedure(s): Right Axillary Lymphatic Mapping; Right axillary Sentinal Node Biopsy; Right Partial Mastectomy after wire localization (Right)  Patient Location: PACU  Anesthesia Type:GA combined with regional for post-op pain  Level of Consciousness: awake, alert  and oriented  Airway and Oxygen Therapy: Patient Spontanous Breathing  Post-op Pain: none  Post-op Assessment: Post-op Vital signs reviewed  Post-op Vital Signs: Reviewed  Last Vitals:  Filed Vitals:   04/02/14 1606  BP:   Pulse: 71  Temp:   Resp: 23    Complications: No apparent anesthesia complications

## 2014-04-02 NOTE — Progress Notes (Signed)
Report given to rebecca lsade rn as caregiver

## 2014-04-02 NOTE — Interval H&P Note (Signed)
History and Physical Interval Note:  04/02/2014 10:58 AM  Tami [name]  has presented today for surgery, with the diagnosis of Right Breast Cancer  The various methods of treatment have been discussed with the patient and family. After consideration of risks, benefits and other options for treatment, the patient has consented to  Procedure(s): RIGHT LUMPECTOMY WITH NEEDLE LOCALIZATION AND RIGHT AXILLARY SENTINEL LYMPH NODE BX (Right) as a surgical intervention .  The patient's history has been reviewed, patient examined, no change in status, stable for surgery.  I have reviewed the patient's chart and labs.  Questions were answered to the patient's satisfaction.     Pallavi Clifton Lenna Sciara

## 2014-04-03 ENCOUNTER — Encounter (HOSPITAL_COMMUNITY): Payer: Self-pay | Admitting: General Surgery

## 2014-04-05 ENCOUNTER — Telehealth: Payer: Self-pay | Admitting: *Deleted

## 2014-04-05 ENCOUNTER — Encounter: Payer: Self-pay | Admitting: *Deleted

## 2014-04-05 ENCOUNTER — Encounter (INDEPENDENT_AMBULATORY_CARE_PROVIDER_SITE_OTHER): Payer: Self-pay | Admitting: General Surgery

## 2014-04-05 NOTE — Telephone Encounter (Signed)
Call and informed patient of lab results and recommendations.

## 2014-04-05 NOTE — Progress Notes (Unsigned)
Patient ID: Tami [name], female   DOB: 08-Oct-1950, 63 y.o.   MRN: [id number] Diagnosis 1. Lymph node, sentinel, biopsy, right - ONE LYMPH NODE, NEGATIVE FOR TUMOR (0/1). 2. Lymph node, sentinel, biopsy, right - ONE LYMPH NODE, NEGATIVE FOR TUMOR (0/1). 3. Lymph node, sentinel, biopsy, right - ONE LYMPH NODE, NEGATIVE FOR TUMOR (0/1). 4. Breast, lumpectomy, right - INVASIVE DUCTAL CARCINOMA, SEE COMMENT. - NEGATIVE FOR LYMPH VASCULAR INVASION. - INVASIVE TUMOR IS 0.6 CM FROM NEAREST MARGIN (POSTERIOR). - NEGATIVE FOR LYMPH VASCULAR INVASION. - PREVIOUS BIOPSY SITE IDENTIFIED. - SEE TUMOR SYNOPTIC TEMPLATE BELOW.  Pathology discussed with her.  She is doing well.

## 2014-04-05 NOTE — Telephone Encounter (Signed)
Message copied by Lauralee Evener on Fri Apr 05, 2014  4:05 PM ------      Message from: Shelva Majestic A      Created: Sun Mar 31, 2014  9:47 AM       Labs good x lipids; will not start RX since since will undergo breast surgery and RAD rx therapy; F/U lipids in 4-6 mo ------

## 2014-04-05 NOTE — Progress Notes (Signed)
Received order for oncotype from Dr. Gudena. Requisition sent to pathology. Received by Christy. 

## 2014-04-12 ENCOUNTER — Ambulatory Visit: Payer: BC Managed Care – PPO | Admitting: Cardiovascular Disease

## 2014-04-12 ENCOUNTER — Telehealth: Payer: Self-pay | Admitting: Cardiovascular Disease

## 2014-04-12 MED ORDER — FLECAINIDE ACETATE 100 MG PO TABS: 50.0000 mg | ORAL_TABLET | Freq: Two times a day (BID) | ORAL | Status: DC

## 2014-04-12 MED ORDER — METOPROLOL SUCCINATE ER 50 MG PO TB24: 50.0000 mg | ORAL_TABLET | Freq: Every day | ORAL | Status: DC

## 2014-04-12 NOTE — Telephone Encounter (Signed)
[facility] sent to [facility] Flecainide and metoprolol Paroxetine - primary need to fill Patient aware.

## 2014-04-12 NOTE — Telephone Encounter (Signed)
Pt called in wanting to see if she could have a 90 day supply of her Flecainide , Metoprolol, and Paroxetine. She would like [facility] to [facility] on [address]. Please call  Thanks

## 2014-04-15 ENCOUNTER — Encounter (HOSPITAL_COMMUNITY): Payer: Self-pay

## 2014-04-15 ENCOUNTER — Encounter: Payer: Self-pay | Admitting: *Deleted

## 2014-04-15 NOTE — Progress Notes (Signed)
Received Oncotype Dx results of 29.  Placed a copy in Dr. Geralyn Flash box and took one to HIM to scan.

## 2014-04-16 ENCOUNTER — Telehealth: Payer: Self-pay | Admitting: Hematology and Oncology

## 2014-04-16 ENCOUNTER — Encounter: Payer: Self-pay | Admitting: Hematology and Oncology

## 2014-04-16 NOTE — Telephone Encounter (Signed)
s.w. pt and advised on OCT 16th  appt.Marland KitchenMarland KitchenMarland KitchenMarland Kitchenpt ok adn aware

## 2014-04-19 ENCOUNTER — Telehealth: Payer: Self-pay | Admitting: Hematology and Oncology

## 2014-04-19 ENCOUNTER — Ambulatory Visit (HOSPITAL_BASED_OUTPATIENT_CLINIC_OR_DEPARTMENT_OTHER): Payer: BC Managed Care – PPO | Admitting: Hematology and Oncology

## 2014-04-19 VITALS — BP 136/64 | HR 59 | Temp 98.4°F | Resp 20 | Ht 64.0 in | Wt 152.5 lb

## 2014-04-19 DIAGNOSIS — Z170 Estrogen receptor positive status [ER+]: Secondary | ICD-10-CM

## 2014-04-19 DIAGNOSIS — C50311 Malignant neoplasm of lower-inner quadrant of right female breast: Secondary | ICD-10-CM

## 2014-04-19 MED ORDER — ONDANSETRON HCL 8 MG PO TABS: 8.0000 mg | ORAL_TABLET | Freq: Two times a day (BID) | ORAL | Status: DC

## 2014-04-19 MED ORDER — LORAZEPAM 0.5 MG PO TABS: 0.5000 mg | ORAL_TABLET | Freq: Four times a day (QID) | ORAL | Status: DC | PRN

## 2014-04-19 MED ORDER — DEXAMETHASONE 4 MG PO TABS: 8.0000 mg | ORAL_TABLET | Freq: Two times a day (BID) | ORAL | Status: DC

## 2014-04-19 MED ORDER — PROCHLORPERAZINE MALEATE 10 MG PO TABS: 10.0000 mg | ORAL_TABLET | Freq: Four times a day (QID) | ORAL | Status: DC | PRN

## 2014-04-19 NOTE — Telephone Encounter (Signed)
, °

## 2014-04-19 NOTE — Assessment & Plan Note (Signed)
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 status post lumpectomy, Oncotype DX recurrence score 29 intermediate risk 19% risk of recurrence with hormonal therapy alone  I discussed his of Oncotype DX with the patient as well as the pathology report in great detail. I recommended adjuvant systemic chemotherapy with Taxotere and Cytoxan once every 3 weeks for 4 cycles with Neulasta on day 2. I discussed the risks and benefits of chemotherapy including the risk of hair loss, infection, nausea vomiting, neuropathy, cytopenias, long-term bone marrow dysfunction, anemia, fatigue, loss of taste and decreased appetite.  Patient will need a port placement because of poor veins, chemotherapy class. Our tentative plan is to start chemotherapy in 3 weeks. Patient return back to see is in 3 weeks for start of chemotherapy and to sign consent.

## 2014-04-19 NOTE — Progress Notes (Signed)
Patient Care Team: [name], MD as PCP - General (Family Medicine) Rebecca Eaton, MD (Cardiology) Ailene Rud, MD as Attending Physician (Urology)  DIAGNOSIS: Breast cancer of lower-inner quadrant of right female breast   Primary site: Breast (Right)   Staging method: AJCC 7th Edition   Clinical: Stage IA (T1b, N0, cM0) signed by Rulon Eisenmenger, MD on 02/26/2014  2:05 PM   Summary: Stage IA (T1b, N0, cM0)   SUMMARY OF ONCOLOGIC HISTORY:   Breast cancer of lower-inner quadrant of right female breast   01/18/2014 Mammogram U/S Breast: 7 x 7 x 9 mm irregular taller than wide hypoechoic mass with a peripheral echogenic rim in the right breast 4 o'clock position 7 cm from the nipple. No right axillary lymphadenopathy.    02/14/2014 Initial Diagnosis Breast cancer-right Er 100%, PR 94%, Ki 67: 17%; Her2 Neg ratio 1.74; T1bN0Mo (Clinical Stage 1A)   04/02/2014 Surgery Right breast lumpectomy: Invasive ductal carcinoma negative for lymph vascular invasion, 1.6 cm, grade 2, 3 SLN negative, reexcision margins benign, ER 100%, PR 94%, HER-2 negative Ki-67 70% T1 C. N0 M0 stage IA: Oncotype DX 29 (19%ROR)    CHIEF COMPLIANT: Followup after surgery  INTERVAL HISTORY: Tami [name] is a 63 year old with above-mentioned history of right-sided breast cancer status post lumpectomy. She is here to discuss the pathology report as well as to discuss the Oncotype DX testing. She is recovering very well from surgery still remains fairly sore but improving every day.  REVIEW OF SYSTEMS:   Constitutional: Denies fevers, chills or abnormal weight loss Eyes: Denies blurriness of vision Ears, nose, mouth, throat, and face: Denies mucositis or sore throat Respiratory: Denies cough, dyspnea or wheezes Cardiovascular: Denies palpitation, chest discomfort or lower extremity swelling Gastrointestinal:  Denies nausea, heartburn or change in bowel habits Skin: Denies abnormal skin  rashes Lymphatics: Denies new lymphadenopathy or easy bruising Neurological:Denies numbness, tingling or new weaknesses Behavioral/Psych: Mood is stable, no new changes  Breast: Healing well from surgery All other systems were reviewed with the patient and are negative.  I have reviewed the past medical history, past surgical history, social history and family history with the patient and they are unchanged from previous note.  ALLERGIES:  is allergic to latex; clarithromycin; crestor; statins; sulfonamide derivatives; and ciprofloxacin hcl.  MEDICATIONS:  Current Outpatient Prescriptions  Medication Sig Dispense Refill  . amitriptyline (ELAVIL) 25 MG tablet Take 25 mg by mouth at bedtime as needed for sleep. When patient has migraines      . aspirin 81 MG tablet Take 81 mg by mouth daily.      [name] b complex vitamins tablet Take 1 tablet by mouth daily.      . celecoxib (CELEBREX) 200 MG capsule Take 200 mg by mouth 2 (two) times daily as needed for moderate pain.      . cholecalciferol (VITAMIN D) 1000 UNITS tablet Take 1,000 Units by mouth daily.      . flecainide (TAMBOCOR) 100 MG tablet Take 0.5 tablets (50 mg total) by mouth 2 (two) times daily.  90 tablet  3  . gabapentin (NEURONTIN) 300 MG capsule Take 1 capsule (300 mg total) by mouth 3 (three) times daily. PATIENT NEEDS OFFICE VISIT FOR ADDITIONAL REFILLS  90 capsule  0  . HYDROcodone-acetaminophen (NORCO/VICODIN) 5-325 MG per tablet Take 1-2 tablets by mouth every 4 (four) hours as needed for moderate pain or severe pain.  40 tablet  0  . Magnesium 250 MG TABS Take 250  mg by mouth daily.      . metoprolol succinate (TOPROL-XL) 50 MG 24 hr tablet Take 1 tablet (50 mg total) by mouth daily. Take with or immediately following a meal.  90 tablet  3  . Omega-3 Fatty Acids (OMEGA 3 PO) Take 2 capsules by mouth 2 (two) times daily.      . ondansetron (ZOFRAN) 4 MG tablet Take 1 tablet (4 mg total) by mouth every 4 (four) hours as needed for  nausea.  20 tablet  0  . ondansetron (ZOFRAN) 8 MG tablet Take 1 tablet (8 mg total) by mouth every 8 (eight) hours as needed for nausea or vomiting.  20 tablet  0  . PARoxetine (PAXIL) 20 MG tablet Take 20 mg by mouth daily.      . valACYclovir (VALTREX) 500 MG tablet Take 1 tablet (500 mg total) by mouth 2 (two) times daily as needed.  60 tablet  11  . dexamethasone (DECADRON) 4 MG tablet Take 2 tablets (8 mg total) by mouth 2 (two) times daily. Start the day before Taxotere. Then again the day after chemo for 3 days.  30 tablet  1  . LORazepam (ATIVAN) 0.5 MG tablet Take 1 tablet (0.5 mg total) by mouth every 6 (six) hours as needed (Nausea or vomiting).  30 tablet  0  . ondansetron (ZOFRAN) 8 MG tablet Take 1 tablet (8 mg total) by mouth 2 (two) times daily. Start the day after chemo for 3 days. Then take as needed for nausea or vomiting.  30 tablet  1  . prochlorperazine (COMPAZINE) 10 MG tablet Take 1 tablet (10 mg total) by mouth every 6 (six) hours as needed (Nausea or vomiting).  30 tablet  1   No current facility-administered medications for this visit.    PHYSICAL EXAMINATION: ECOG PERFORMANCE STATUS: 1 - Symptomatic but completely ambulatory  Filed Vitals:   04/19/14 1240  BP: 136/64  Pulse: 59  Temp: 98.4 F (36.9 C)  Resp: 20   Filed Weights   04/19/14 1240  Weight: 152 lb 8 oz (69.174 kg)    GENERAL:alert, no distress and comfortable SKIN: skin color, texture, turgor are normal, no rashes or significant lesions EYES: normal, Conjunctiva are pink and non-injected, sclera clear OROPHARYNX:no exudate, no erythema and lips, buccal mucosa, and tongue normal  NECK: supple, thyroid normal size, non-tender, without nodularity LYMPH:  no palpable lymphadenopathy in the cervical, axillary or inguinal LUNGS: clear to auscultation and percussion with normal breathing effort HEART: regular rate & rhythm and no murmurs and no lower extremity edema ABDOMEN:abdomen soft, non-tender  and normal bowel sounds Musculoskeletal:no cyanosis of digits and no clubbing  NEURO: alert & oriented x 3 with fluent speech, no focal motor/sensory deficits  LABORATORY DATA:  I have reviewed the data as listed   Chemistry      Component Value Date/Time   NA 144 03/28/2014 1145   K 4.1 03/28/2014 1145   CL 106 03/28/2014 1145   CO2 25 03/28/2014 1145   BUN 10 03/28/2014 1145   CREATININE 0.77 03/28/2014 1145   CREATININE 0.83 03/27/2014 1102      Component Value Date/Time   CALCIUM 9.6 03/28/2014 1145   ALKPHOS 85 03/28/2014 1145   AST 21 03/28/2014 1145   ALT 27 03/28/2014 1145   BILITOT 0.3 03/28/2014 1145       Lab Results  Component Value Date   WBC 4.4 03/28/2014   HGB 12.7 03/28/2014   HCT 36.5 03/28/2014  MCV 89.2 03/28/2014   PLT 257 03/28/2014   NEUTROABS 2.4 03/28/2014   ASSESSMENT & PLAN:  Breast cancer of lower-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 status post lumpectomy, Oncotype DX recurrence score 29 intermediate risk 19% risk of recurrence with hormonal therapy alone  I discussed his of Oncotype DX with the patient as well as the pathology report in great detail. I recommended adjuvant systemic chemotherapy with Taxotere and Cytoxan once every 3 weeks for 4 cycles with Neulasta on day 2. I discussed the risks and benefits of chemotherapy including the risk of hair loss, infection, nausea vomiting, neuropathy, cytopenias, long-term bone marrow dysfunction, anemia, fatigue, loss of taste and decreased appetite.  Patient will need a port placement because of poor veins, chemotherapy class. Our tentative plan is to start chemotherapy in 3 weeks. Patient return back to see is in 3 weeks for start of chemotherapy and to sign consent.   No orders of the defined types were placed in this encounter.   The patient has a good understanding of the overall plan. she agrees  with it. She will call with any problems that may develop before her next visit here.  I spent 25 minutes counseling the patient face to face. The total time spent in the appointment was 30 minutes and more than 50% was on counseling and review of test results    Rulon Eisenmenger, MD 04/19/2014 1:13 PM

## 2014-04-22 ENCOUNTER — Telehealth: Payer: Self-pay | Admitting: *Deleted

## 2014-04-22 ENCOUNTER — Telehealth: Payer: Self-pay | Admitting: Hematology and Oncology

## 2014-04-22 NOTE — Telephone Encounter (Signed)
Per staff message and POF I have scheduled appts. Advised scheduler of appts. JMW  

## 2014-04-22 NOTE — Addendum Note (Signed)
Addended by: Prentiss Bells on: 04/22/2014 09:45 AM   Modules accepted: Orders

## 2014-04-22 NOTE — Telephone Encounter (Signed)
, °

## 2014-04-23 ENCOUNTER — Telehealth: Payer: Self-pay

## 2014-04-23 ENCOUNTER — Other Ambulatory Visit (INDEPENDENT_AMBULATORY_CARE_PROVIDER_SITE_OTHER): Payer: Self-pay | Admitting: General Surgery

## 2014-04-23 ENCOUNTER — Other Ambulatory Visit: Payer: Self-pay

## 2014-04-23 NOTE — Telephone Encounter (Signed)
Pt called requesting info about venous access for chemotherapy.  She has been referred for a port dt small veins/difficult iv access.  Pt only scheduled for 4 treatments.   Pt wants to know if there are other options.  Let pt know she could opt for treatment by IV - pros - no surgery for central line, cons - possible multiple sticks, damage to veins from chemo.  Let pt know PICC and port pros: ease of access/less sticks, can be used to draw labs, can be used for contrast studies, less venous damage, PICC easier to remove.  Let pt know cons of port - must be maintained with flushes, risk of infection, surgical procedure, PICC needs dressing/cap changes and flushes to maintain.  Let pt know she would need to make the decision, but if she had any additional questions she should feel free to call us.  Pt voiced understanding.

## 2014-04-23 NOTE — Telephone Encounter (Signed)
Patient needs a refill for paxil medication, wants to know if Dr. Carlean Jews will refill for 90 days. Please return call and advise. Thank you. [phone]

## 2014-04-24 ENCOUNTER — Encounter (HOSPITAL_COMMUNITY): Payer: Self-pay | Admitting: *Deleted

## 2014-04-24 ENCOUNTER — Encounter (INDEPENDENT_AMBULATORY_CARE_PROVIDER_SITE_OTHER): Payer: Self-pay | Admitting: General Surgery

## 2014-04-24 MED ORDER — PAROXETINE HCL 20 MG PO TABS: 20.0000 mg | ORAL_TABLET | Freq: Every day | ORAL | Status: DC

## 2014-04-24 NOTE — Progress Notes (Signed)
Patient ID: Tami [name], female   DOB: [birth year], 63 y.o.   MRN: [id number]  Tami [name]. [place] 04/24/2014 11:34 AM Location: [facility] Patient #: [id number] DOB: [birth year] Married / Language: English / Race: Black or African American Female History of Present Illness [name] MD; 04/24/2014 12:02 PM) Patient words: po lumpectomy, pre op PAC insertion.  The patient is a 63 year old female    Note:Procedure: Right axillary lymphatic mapping. Right axillary sentinel lymph node biopsy (3 lymph nodes). Right partial mastectomy after wire localization  Date: 04/02/14  Pathology: T1cN0, ER/PR positive, Oncotype 29.  History: She is here for her first postoperative visit. She has seen Dr. Lindi Adie and the plan is for systemic chemotherapy. She needs a Port-A-Cath. She has not had a lot of discomfort.  Exam: General- Is in NAD. Right breast-wound is clean and intact  Right axilla-wound is clean and intact  Other Problems Ventura Sellers, CMA; 04/24/2014 11:35 AM) Anxiety Disorder Arthritis Asthma Atrial Fibrillation Back Pain Bladder Problems Breast Cancer Chest pain Depression Gastroesophageal Reflux Disease Hypercholesterolemia Lump In Breast Migraine Headache  Past Surgical History Ventura Sellers, [place]; 04/24/2014 11:35 AM) Breast Biopsy Bilateral. Colon Polyp Removal - Colonoscopy Hysterectomy (not due to cancer) - Partial  Diagnostic Studies History Ventura Sellers, [place]; 04/24/2014 11:35 AM) Colonoscopy 5-10 years ago Mammogram 1-3 years ago  Allergies Ventura Sellers, CMA; 04/24/2014 11:38 AM) Latex Clarithromycin *CHEMICALS* Crestor *ANTIHYPERLIPIDEMICS* Statins Depletion *DIETARY PRODUCTS/DIETARY MANAGEMENT PRODUCTS* Ciprofloxacin *CHEMICALS* Sulfonated Castor Oil *DERMATOLOGICALS*  Medication History Ventura Sellers, CMA; 04/24/2014 11:42 AM) Amitriptyline HCl (25MG  Tablet, Oral)  Active. Celecoxib (200MG  Capsule, Oral) Active. Dexamethasone (4MG  Tablet, Oral) Active. Flecainide Acetate (100MG  Tablet, Oral) Active. Gabapentin (300MG  Capsule, Oral) Active. Hydrocodone-Acetaminophen (5-325MG  Tablet, Oral) Active. Metoprolol Succinate ER (50MG  Tablet ER 24HR, Oral) Active. Ondansetron HCl (4MG  Tablet, Oral) Active. Prochlorperazine Maleate (10MG  Tablet, Oral) Active. Aspirin EC (81MG  Tablet DR, Oral) Active. B Complex (Oral) Active. Vitamin D (1000UNIT Capsule, Oral) Active. Magnesium Gluconate (250MG  Tablet, Oral) Active. Omega 3 (1000MG  Capsule, Oral) Active. Valtrex (500MG  Tablet, Oral) Active. Paxil (20MG  Tablet, Oral) Active.  Social History Ventura Sellers, [place]; 04/24/2014 11:35 AM) Caffeine use Coffee, Tea. No alcohol use No drug use Tobacco use Never smoker.  Family History Ventura Sellers, [place]; 04/24/2014 11:35 AM) Alcohol Abuse Family Members In General. Breast Cancer Sister. Colon Polyps Sister. Heart disease in female family member before age 27 Melanoma Mother. Ovarian Cancer Family Members In General.  Pregnancy / Birth History Ventura Sellers, [place]; 04/24/2014 11:35 AM) Age at menarche 44 years. Age of menopause >2 Gravida 2 Maternal age 14-30 Para 2     Review of Systems Sharyn Lull R. Brooks CMA; 04/24/2014 11:35 AM) General Not Present- Appetite Loss, Chills, Fatigue, Fever, Night Sweats, Weight Gain and Weight Loss. Skin Present- Dryness. Not Present- Change in Wart/Mole, Hives, Jaundice, New Lesions, Non-Healing Wounds, Rash and Ulcer. HEENT Present- Seasonal Allergies and Wears glasses/contact lenses. Not Present- Earache, Hearing Loss, Hoarseness, Nose Bleed, Oral Ulcers, Ringing in the Ears, Sinus Pain, Sore Throat, Visual Disturbances and Yellow Eyes. Respiratory Not Present- Bloody sputum, Chronic Cough, Difficulty Breathing, Snoring and Wheezing. Breast Present- Breast Pain. Not Present-  Breast Mass, Nipple Discharge and Skin Changes. Cardiovascular Not Present- Chest Pain, Difficulty Breathing Lying Down, Leg Cramps, Palpitations, Rapid Heart Rate, Shortness of Breath and Swelling of Extremities. Gastrointestinal Present- Excessive gas. Not Present- Abdominal Pain, Bloating, Bloody Stool, Change in Bowel Habits, Chronic diarrhea, Constipation, Difficulty Swallowing,  Gets full quickly at meals, Hemorrhoids, Indigestion, Nausea, Rectal Pain and Vomiting. Female Genitourinary Present- Frequency and Nocturia. Not Present- Painful Urination, Pelvic Pain and Urgency. Musculoskeletal Present- Back Pain and Joint Stiffness. Not Present- Joint Pain, Muscle Pain, Muscle Weakness and Swelling of Extremities. Neurological Present- Headaches. Not Present- Decreased Memory, Fainting, Numbness, Seizures, Tingling, Tremor, Trouble walking and Weakness. Psychiatric Present- Anxiety. Not Present- Bipolar, Change in Sleep Pattern, Depression, Fearful and Frequent crying. Endocrine Present- Hot flashes. Not Present- Cold Intolerance, Excessive Hunger, Hair Changes, Heat Intolerance and New Diabetes. Hematology Not Present- Easy Bruising, Excessive bleeding, Gland problems, HIV and Persistent Infections.  Vitals [employer] R. Brooks CMA; 04/24/2014 11:35 AM) 04/24/2014 11:35 AM Weight: 154.25 lb Height: 64in Body Surface Area: 1.78 m Body Mass Index: 26.48 kg/m BP: 126/84 (Sitting, Left Arm, Standard)     Assessment & Plan [name] MD; 04/24/2014 12:02 PM)  POST-OPERATIVE STATE (V45.89  Z98.89) Impression: Assessment: T1cN0 invasive right breast cancer. The wounds are healing well.  Plan: Port-a-cath insertion. The procedure risks and aftercare been explained. Risks include but are not limited to bleeding, infection, malfunction, pneumothorax, wound problems, DVT.

## 2014-04-24 NOTE — Telephone Encounter (Signed)
Pended refill for #90 with 2 refills. Please advise.

## 2014-04-25 ENCOUNTER — Other Ambulatory Visit: Payer: BC Managed Care – PPO

## 2014-04-25 ENCOUNTER — Ambulatory Visit: Payer: BC Managed Care – PPO | Admitting: Hematology and Oncology

## 2014-04-25 ENCOUNTER — Encounter (HOSPITAL_COMMUNITY): Payer: Self-pay | Admitting: *Deleted

## 2014-04-26 ENCOUNTER — Ambulatory Visit (HOSPITAL_COMMUNITY): Payer: BC Managed Care – PPO

## 2014-04-26 ENCOUNTER — Encounter (HOSPITAL_COMMUNITY): Payer: BC Managed Care – PPO | Admitting: Anesthesiology

## 2014-04-26 ENCOUNTER — Ambulatory Visit (HOSPITAL_COMMUNITY): Payer: BC Managed Care – PPO | Admitting: Anesthesiology

## 2014-04-26 ENCOUNTER — Encounter (HOSPITAL_COMMUNITY)
Admission: RE | Disposition: A | Discharge status: Home / Self Care | Payer: Self-pay | Source: Ambulatory Visit | Attending: General Surgery

## 2014-04-26 ENCOUNTER — Ambulatory Visit (HOSPITAL_COMMUNITY)
Admission: RE | Admit: 2014-04-26 | Discharge: 2014-04-26 | Disposition: A | Discharge status: Home / Self Care | Payer: BC Managed Care – PPO | Source: Ambulatory Visit | Attending: General Surgery | Admitting: General Surgery

## 2014-04-26 ENCOUNTER — Encounter (HOSPITAL_COMMUNITY): Payer: Self-pay

## 2014-04-26 DIAGNOSIS — C50911 Malignant neoplasm of unspecified site of right female breast: Secondary | ICD-10-CM | POA: Diagnosis not present

## 2014-04-26 DIAGNOSIS — K219 Gastro-esophageal reflux disease without esophagitis: Secondary | ICD-10-CM | POA: Diagnosis not present

## 2014-04-26 DIAGNOSIS — J45909 Unspecified asthma, uncomplicated: Secondary | ICD-10-CM | POA: Insufficient documentation

## 2014-04-26 DIAGNOSIS — Z91040 Latex allergy status: Secondary | ICD-10-CM | POA: Insufficient documentation

## 2014-04-26 DIAGNOSIS — M1990 Unspecified osteoarthritis, unspecified site: Secondary | ICD-10-CM | POA: Diagnosis not present

## 2014-04-26 DIAGNOSIS — F419 Anxiety disorder, unspecified: Secondary | ICD-10-CM | POA: Insufficient documentation

## 2014-04-26 DIAGNOSIS — I4891 Unspecified atrial fibrillation: Secondary | ICD-10-CM | POA: Diagnosis not present

## 2014-04-26 DIAGNOSIS — F329 Major depressive disorder, single episode, unspecified: Secondary | ICD-10-CM | POA: Insufficient documentation

## 2014-04-26 DIAGNOSIS — G43909 Migraine, unspecified, not intractable, without status migrainosus: Secondary | ICD-10-CM | POA: Diagnosis not present

## 2014-04-26 DIAGNOSIS — E780 Pure hypercholesterolemia: Secondary | ICD-10-CM | POA: Insufficient documentation

## 2014-04-26 DIAGNOSIS — Z882 Allergy status to sulfonamides status: Secondary | ICD-10-CM | POA: Insufficient documentation

## 2014-04-26 DIAGNOSIS — M549 Dorsalgia, unspecified: Secondary | ICD-10-CM | POA: Diagnosis not present

## 2014-04-26 DIAGNOSIS — Z95828 Presence of other vascular implants and grafts: Secondary | ICD-10-CM

## 2014-04-26 DIAGNOSIS — Z881 Allergy status to other antibiotic agents status: Secondary | ICD-10-CM | POA: Diagnosis not present

## 2014-04-26 HISTORY — PX: PORTACATH PLACEMENT: SHX2246

## 2014-04-26 SURGERY — INSERTION, TUNNELED CENTRAL VENOUS DEVICE, WITH PORT
Anesthesia: General | Site: Chest

## 2014-04-26 MED ORDER — FENTANYL CITRATE 0.05 MG/ML IJ SOLN
INTRAMUSCULAR | Status: DC | PRN
  Administered 2014-04-26: 100 ug via INTRAVENOUS

## 2014-04-26 MED ORDER — CEFAZOLIN SODIUM-DEXTROSE 2-3 GM-% IV SOLR
2.0000 g | INTRAVENOUS | Status: AC
  Administered 2014-04-26: 2 g via INTRAVENOUS

## 2014-04-26 MED ORDER — LIDOCAINE HCL 1 % IJ SOLN
INTRAMUSCULAR | Status: DC | PRN
  Administered 2014-04-26: 50 mg via INTRADERMAL

## 2014-04-26 MED ORDER — FENTANYL CITRATE 0.05 MG/ML IJ SOLN: 25.0000 ug | INTRAMUSCULAR | Status: DC | PRN

## 2014-04-26 MED ORDER — ACETAMINOPHEN 650 MG RE SUPP
650.0000 mg | RECTAL | Status: DC | PRN
  Filled 2014-04-26: qty 1

## 2014-04-26 MED ORDER — ONDANSETRON HCL 4 MG/2ML IJ SOLN
INTRAMUSCULAR | Status: DC | PRN
  Administered 2014-04-26: 4 mg via INTRAVENOUS

## 2014-04-26 MED ORDER — OXYCODONE HCL 5 MG PO TABS
5.0000 mg | ORAL_TABLET | ORAL | Status: DC | PRN
  Administered 2014-04-26: 5 mg via ORAL
  Filled 2014-04-26: qty 1

## 2014-04-26 MED ORDER — GLYCOPYRROLATE 0.2 MG/ML IJ SOLN
INTRAMUSCULAR | Status: DC | PRN
  Administered 2014-04-26: 0.2 mg via INTRAVENOUS

## 2014-04-26 MED ORDER — HYDROCODONE-ACETAMINOPHEN 5-325 MG PO TABS: 1.0000 | ORAL_TABLET | Freq: Four times a day (QID) | ORAL | Status: DC | PRN

## 2014-04-26 MED ORDER — HEPARIN SOD (PORK) LOCK FLUSH 100 UNIT/ML IV SOLN
INTRAVENOUS | Status: DC | PRN
  Administered 2014-04-26: 500 [IU] via INTRAVENOUS

## 2014-04-26 MED ORDER — PROPOFOL 10 MG/ML IV BOLUS
INTRAVENOUS | Status: AC
  Filled 2014-04-26: qty 20

## 2014-04-26 MED ORDER — PHENYLEPHRINE HCL 10 MG/ML IJ SOLN
INTRAMUSCULAR | Status: DC | PRN
  Administered 2014-04-26: 40 ug via INTRAVENOUS

## 2014-04-26 MED ORDER — FENTANYL CITRATE 0.05 MG/ML IJ SOLN
INTRAMUSCULAR | Status: AC
  Filled 2014-04-26: qty 2

## 2014-04-26 MED ORDER — LACTATED RINGERS IV SOLN
INTRAVENOUS | Status: DC
  Administered 2014-04-26: 1000 mL via INTRAVENOUS

## 2014-04-26 MED ORDER — PROPOFOL 10 MG/ML IV BOLUS
INTRAVENOUS | Status: DC | PRN
  Administered 2014-04-26: 200 mg via INTRAVENOUS

## 2014-04-26 MED ORDER — LIDOCAINE HCL 1 % IJ SOLN
INTRAMUSCULAR | Status: DC | PRN
  Administered 2014-04-26: 8 mL

## 2014-04-26 MED ORDER — SODIUM CHLORIDE 0.9 % IR SOLN
Freq: Once | Status: DC
  Filled 2014-04-26: qty 1.2

## 2014-04-26 MED ORDER — CEFAZOLIN SODIUM-DEXTROSE 2-3 GM-% IV SOLR
INTRAVENOUS | Status: AC
Start: 2014-04-26 — End: 2014-04-26
  Filled 2014-04-26: qty 50

## 2014-04-26 MED ORDER — LACTATED RINGERS IV SOLN: INTRAVENOUS | Status: DC

## 2014-04-26 MED ORDER — LIDOCAINE HCL 1 % IJ SOLN
INTRAMUSCULAR | Status: AC
  Filled 2014-04-26: qty 20

## 2014-04-26 MED ORDER — LIDOCAINE-PRILOCAINE 2.5-2.5 % EX CREA: 1.0000 "application " | TOPICAL_CREAM | CUTANEOUS | Status: DC | PRN

## 2014-04-26 MED ORDER — SODIUM CHLORIDE 0.9 % IJ SOLN: 3.0000 mL | INTRAMUSCULAR | Status: DC | PRN

## 2014-04-26 MED ORDER — MIDAZOLAM HCL 5 MG/5ML IJ SOLN
INTRAMUSCULAR | Status: DC | PRN
  Administered 2014-04-26: 2 mg via INTRAVENOUS

## 2014-04-26 MED ORDER — SODIUM CHLORIDE 0.9 % IR SOLN: Status: DC | PRN

## 2014-04-26 MED ORDER — BUPIVACAINE HCL (PF) 0.5 % IJ SOLN
INTRAMUSCULAR | Status: AC
  Filled 2014-04-26: qty 30

## 2014-04-26 MED ORDER — PHENYLEPHRINE 40 MCG/ML (10ML) SYRINGE FOR IV PUSH (FOR BLOOD PRESSURE SUPPORT)
PREFILLED_SYRINGE | INTRAVENOUS | Status: AC
  Filled 2014-04-26: qty 10

## 2014-04-26 MED ORDER — MIDAZOLAM HCL 2 MG/2ML IJ SOLN
INTRAMUSCULAR | Status: AC
  Filled 2014-04-26: qty 2

## 2014-04-26 MED ORDER — ACETAMINOPHEN 325 MG PO TABS: 650.0000 mg | ORAL_TABLET | ORAL | Status: DC | PRN

## 2014-04-26 MED ORDER — HEPARIN SOD (PORK) LOCK FLUSH 100 UNIT/ML IV SOLN
INTRAVENOUS | Status: AC
  Filled 2014-04-26: qty 5

## 2014-04-26 MED ORDER — LIDOCAINE HCL (CARDIAC) 20 MG/ML IV SOLN
INTRAVENOUS | Status: AC
  Filled 2014-04-26: qty 5

## 2014-04-26 SURGICAL SUPPLY — 41 items
BAG DECANTER FOR FLEXI CONT (MISCELLANEOUS) ×3 IMPLANT
BENZOIN TINCTURE PRP APPL 2/3 (GAUZE/BANDAGES/DRESSINGS) ×3 IMPLANT
BLADE HEX COATED 2.75 (ELECTRODE) ×3 IMPLANT
BLADE SURG 15 STRL LF DISP TIS (BLADE) ×1 IMPLANT
BLADE SURG 15 STRL SS (BLADE) ×2
BLADE SURG SZ11 CARB STEEL (BLADE) ×3 IMPLANT
CHLORAPREP W/TINT 26ML (MISCELLANEOUS) ×3 IMPLANT
CLOSURE WOUND 1/2 X4 (GAUZE/BANDAGES/DRESSINGS) ×1
DECANTER SPIKE VIAL GLASS SM (MISCELLANEOUS) ×3 IMPLANT
DRAPE C-ARM 42X120 X-RAY (DRAPES) ×3 IMPLANT
DRAPE LAPAROTOMY TRNSV 102X78 (DRAPE) ×3 IMPLANT
DRSG TEGADERM 2-3/8X2-3/4 SM (GAUZE/BANDAGES/DRESSINGS) ×3 IMPLANT
DRSG TEGADERM 4X4.75 (GAUZE/BANDAGES/DRESSINGS) ×3 IMPLANT
ELECT REM PT RETURN 9FT ADLT (ELECTROSURGICAL) ×3
ELECTRODE REM PT RTRN 9FT ADLT (ELECTROSURGICAL) ×1 IMPLANT
GAUZE SPONGE 4X4 12PLY STRL (GAUZE/BANDAGES/DRESSINGS) ×3 IMPLANT
GAUZE SPONGE 4X4 16PLY XRAY LF (GAUZE/BANDAGES/DRESSINGS) ×3 IMPLANT
GLOVE ECLIPSE 8.0 STRL XLNG CF (GLOVE) IMPLANT
GLOVE INDICATOR 8.0 STRL GRN (GLOVE) IMPLANT
GLOVE SURG SS PI 6.5 STRL IVOR (GLOVE) ×3 IMPLANT
GLOVE SURG SS PI 7.0 STRL IVOR (GLOVE) ×3 IMPLANT
GLOVE SURG SS PI 7.5 STRL IVOR (GLOVE) ×3 IMPLANT
GLOVE SURG SS PI 8.0 STRL IVOR (GLOVE) ×3 IMPLANT
GOWN STRL REUS W/TWL XL LVL3 (GOWN DISPOSABLE) ×6 IMPLANT
KIT BASIN OR (CUSTOM PROCEDURE TRAY) ×3 IMPLANT
KIT PORT POWER 8FR ISP CVUE (Catheter) ×3 IMPLANT
NEEDLE HYPO 25X1 1.5 SAFETY (NEEDLE) ×3 IMPLANT
NS IRRIG 1000ML POUR BTL (IV SOLUTION) ×3 IMPLANT
PACK BASIC VI WITH GOWN DISP (CUSTOM PROCEDURE TRAY) ×3 IMPLANT
PENCIL BUTTON HOLSTER BLD 10FT (ELECTRODE) ×3 IMPLANT
STRIP CLOSURE SKIN 1/2X4 (GAUZE/BANDAGES/DRESSINGS) ×2 IMPLANT
SUT MNCRL AB 4-0 PS2 18 (SUTURE) ×3 IMPLANT
SUT VIC AB 2-0 SH 18 (SUTURE) ×3 IMPLANT
SUT VIC AB 2-0 SH 27 (SUTURE)
SUT VIC AB 2-0 SH 27X BRD (SUTURE) IMPLANT
SUT VIC AB 3-0 SH 27 (SUTURE)
SUT VIC AB 3-0 SH 27XBRD (SUTURE) IMPLANT
SYR 20CC LL (SYRINGE) ×3 IMPLANT
SYRINGE 10CC LL (SYRINGE) ×3 IMPLANT
TOWEL OR 17X26 10 PK STRL BLUE (TOWEL DISPOSABLE) ×3 IMPLANT
TOWEL OR NON WOVEN STRL DISP B (DISPOSABLE) ×3 IMPLANT

## 2014-04-26 NOTE — Op Note (Signed)
Preoperative diagnosis:  Breast cancer  Postoperative diagnosis:  Same  Procedure: Ultrasound-guided Port-A-Cath insertion into right internal jugular vein under fluoroscopy.  Surgeon: Jackolyn Confer M.D.  Anesthesia: Local (Xylocaine) with MAC.  Indication:  This is a 63 year old female with invasive breast cancer who needs long term venous access for chemotherapy.  She now presents for [facility] insertion.  Technique: She was brought to the operating room, placed supine on the operating table, and intravenous sedation was given. A roll was placed under the back and the arms were tucked. The upper chest wall and neck were sterilely prepped and draped.  Her head was rotated to the right. Using the ultrasound, the left/right internal jugular vein was identified. Local anesthetic was infiltrated in the skin and subcutaneous tissue just anterior to it. A 16-gauge needle was used to cannulate the right internal jugular vein under ultrasound guidance. A wire was then threaded through the needle into the internal jugular vein and down into the right heart under ultrasound and fluoroscopic guidance.  Local anesthetic was infiltrated into the right upper chest wall. A right upper chest wall incision was made and a pocket was created for the Portacath.  An incision was made around the wire in the neck. The catheter was then tunneled from the chest wall incision up through the neck incision.  A dilator- introducer complex was placed over the wire into the superior vena cava. The dilator and wire were then removed and the catheter was threaded through the peel-away sheath introducer into the right heart. The introducer was then peeled away and removed. Under fluoroscopic guidance, the tip of the catheter was then pulled back until it was at the junction of the superior vena cava and right atrium. The catheter was then connected to the port.  The port aspirated blood and flushed easily.  The port was then anchored  to the chest wall with 2-0 Vicryl suture. Concentrated heparin solution was then placed into the port. The port and catheter position were then verified using fluoroscopy. The subcutaneous tissue was then closed over the port with running 3-0 Vicryl suture. The skin incisions were then closed with 4-0 Monocryl subcuticular stitches. Steri-Strips and sterile dressings were applied.  The procedure was well-tolerated without any apparent complications. She was taken to the recovery room in satisfactory condition where a portable chest x-ray is pending.

## 2014-04-26 NOTE — Transfer of Care (Signed)
Immediate Anesthesia Transfer of Care Note  Patient: Tami [name]  Procedure(s) Performed: Procedure(s): ULTRASOUND GUIDED PORT A CATHE INSERTION (N/A)  Patient Location: PACU  Anesthesia Type:General  Level of Consciousness: awake, alert , oriented and patient cooperative  Airway & Oxygen Therapy: Patient Spontanous Breathing and Patient connected to face mask oxygen  Post-op Assessment: Report given to PACU RN and Post -op Vital signs reviewed and stable  Post vital signs: Reviewed and stable  Complications: No apparent anesthesia complications

## 2014-04-26 NOTE — Anesthesia Preprocedure Evaluation (Addendum)
Anesthesia Evaluation  Patient identified by MRN, date of birth, ID band Patient awake    Reviewed: Allergy & Precautions, H&P , NPO status , Patient's Chart, lab work & pertinent test results, reviewed documented beta blocker date and time   Airway Mallampati: III TM Distance: >3 FB Neck ROM: Full    Dental  (+) Dental Advisory Given, Caps All upper front are capped:   Pulmonary asthma ,  breath sounds clear to auscultation  Pulmonary exam normal       Cardiovascular Exercise Tolerance: Good hypertension, On Home Beta Blockers + dysrhythmias Atrial Fibrillation and Supra Ventricular Tachycardia Rhythm:Regular Rate:Normal     Neuro/Psych Anxiety negative neurological ROS  negative psych ROS   GI/Hepatic negative GI ROS, Neg liver ROS, GERD-  ,  Endo/Other  negative endocrine ROS  Renal/GU negative Renal ROS  negative genitourinary   Musculoskeletal  (+) Arthritis -,   Abdominal   Peds  Hematology negative hematology ROS (+)   Anesthesia Other Findings Breast cancer  Reproductive/Obstetrics negative OB ROS                          Anesthesia Physical Anesthesia Plan  ASA: III  Anesthesia Plan: General   Post-op Pain Management:    Induction: Intravenous  Airway Management Planned: LMA  Additional Equipment:   Intra-op Plan:   Post-operative Plan:   Informed Consent: I have reviewed the patients History and Physical, chart, labs and discussed the procedure including the risks, benefits and alternatives for the proposed anesthesia with the patient or authorized representative who has indicated his/her understanding and acceptance.   Dental Advisory Given  Plan Discussed with: CRNA and Surgeon  Anesthesia Plan Comments:         Anesthesia Quick Evaluation

## 2014-04-26 NOTE — Discharge Instructions (Addendum)
PORT-A-CATH: POST OP INSTRUCTIONS  Always review your discharge instruction sheet given to you by the facility where your surgery was performed.   1. A prescription for pain medication may be given to you upon discharge. Take your pain medication as prescribed, if needed. If narcotic pain medicine is not needed, then you make take acetaminophen (Tylenol) or ibuprofen (Advil) as needed.  2. Take your usually prescribed medications unless otherwise directed. 3. If you need a refill on your pain medication, please contact our office. All narcotic pain medicine now requires a paper prescription.  Phoned in and fax refills are no longer allowed by law.  Prescriptions will not be filled after 5 pm or on weekends.  4. You should follow a light diet for the remainder of the day after your procedure. 5. Most patients will experience some mild swelling and/or bruising in the area of the incision. It may take several days to resolve. 6. It is common to experience some constipation if taking pain medication after surgery. Increasing fluid intake and taking a stool softener (such as Colace) will usually help or prevent this problem from occurring. A mild laxative (Milk of Magnesia or Miralax) should be taken according to package directions if there are no bowel movements after 48 hours.  Unless discharge instructions indicate otherwise, you may remove your bandages 72 hours after surgery, and you may shower at that time. You may have steri-strips (small white skin tapes) in place directly over the incision.  These strips should be left on the skin.  If your surgeon used Dermabond (skin glue) on the incision, you may shower in 24 hours.  The glue will flake off over the next 2-3 weeks.  7. If your port is left accessed at the end of surgery (needle left in port), the dressing cannot get wet and should only by changed by a healthcare professional. When the port is no longer accessed (when the needle has been  removed), follow step 7.   8. ACTIVITIES:  Limit activity involving your arms for the next 72 hours. Do no strenuous exercise or activity for 1 week. You may drive when you are no longer taking prescription pain medication, you can comfortably wear a seatbelt, and you can maneuver your car. 10.You may need to see your doctor in the office for a follow-up appointment.  Please       check with your doctor.  11.When you receive a new Port-a-Cath, you will get a product guide and        ID card.  Please keep them in case you need them.  12.  Restart your Aspirin on Monday.  WHEN TO CALL YOUR DOCTOR [phone]): 1. Fever over 101.0 2. Chills 3. Continued bleeding from incision 4. Increased redness and tenderness at the site 5. Shortness of breath, difficulty breathing   The clinic staff is available to answer your questions during regular business hours. Please dont hesitate to call and ask to speak to one of the nurses or medical assistants for clinical concerns. If you have a medical emergency, go to the nearest emergency room or call 911.  A surgeon from [facility] is always on call at the hospital.     For further information, please visit [email or web address]    General Anesthesia, Care After Refer to this sheet in the next few weeks. These instructions provide you with information on caring for yourself after your procedure. Your health care provider may also give you more specific  instructions. Your treatment has been planned according to current medical practices, but problems sometimes occur. Call your health care provider if you have any problems or questions after your procedure. WHAT TO EXPECT AFTER THE PROCEDURE After the procedure, it is typical to experience:  Sleepiness.  Nausea and vomiting. HOME CARE INSTRUCTIONS  For the first 24 hours after general anesthesia:  Have a responsible person with you.  Do not drive a car. If you are alone,  do not take public transportation.  Do not drink alcohol.  Do not take medicine that has not been prescribed by your health care provider.  Do not sign important papers or make important decisions.  You may resume a normal diet and activities as directed by your health care provider.  Change bandages (dressings) as directed.  If you have questions or problems that seem related to general anesthesia, call the hospital and ask for the anesthetist or anesthesiologist on call. SEEK MEDICAL CARE IF:  You have nausea and vomiting that continue the day after anesthesia.  You develop a rash. SEEK IMMEDIATE MEDICAL CARE IF:   You have difficulty breathing.  You have chest pain.  You have any allergic problems. Document Released: 09/27/2000 Document Revised: 06/26/2013 Document Reviewed: 01/04/2013 [facility] Patient Information 2015 [place], [place]. This information is not intended to replace advice given to you by your health care provider. Make sure you discuss any questions you have with your health care provider.

## 2014-04-26 NOTE — Interval H&P Note (Signed)
History and Physical Interval Note:  04/26/2014 12:54 PM  Tami [name]  has presented today for surgery, with the diagnosis of breast cancer  The various methods of treatment have been discussed with the patient and family. After consideration of risks, benefits and other options for treatment, the patient has consented to  Procedure(s): ULTRASOUND GUIDED PORT A CATHE INSERTION (N/A) as a surgical intervention .  The patient's history has been reviewed, patient examined, no change in status, stable for surgery.  I have reviewed the patient's chart and labs.  Questions were answered to the patient's satisfaction.     Meagan Spease Lenna Sciara

## 2014-04-26 NOTE — H&P (View-Only) (Signed)
Patient ID: Tami [name], female   DOB: 01/17/1951, 63 y.o.   MRN: [id number]  Tami [name] 04/24/2014 11:34 AM Location: [facility] Patient #: [id number] DOB: [birth year] Married / Language: English / Race: Black or African American Female History of Present Illness Tami Hollingshead MD; 04/24/2014 12:02 PM) Patient words: po lumpectomy, pre op PAC insertion.  The patient is a 63 year old female    Note:Procedure: Right axillary lymphatic mapping. Right axillary sentinel lymph node biopsy (3 lymph nodes). Right partial mastectomy after wire localization  Date: 04/02/14  Pathology: T1cN0, ER/PR positive, Oncotype 29.  History: She is here for her first postoperative visit. She has seen Dr. Lindi Adie and the plan is for systemic chemotherapy. She needs a Port-A-Cath. She has not had a lot of discomfort.  Exam: General- Is in NAD. Right breast-wound is clean and intact  Right axilla-wound is clean and intact  Other Problems Ventura Sellers, CMA; 04/24/2014 11:35 AM) Anxiety Disorder Arthritis Asthma Atrial Fibrillation Back Pain Bladder Problems Breast Cancer Chest pain Depression Gastroesophageal Reflux Disease Hypercholesterolemia Lump In Breast Migraine Headache  Past Surgical History Ventura Sellers, [place]; 04/24/2014 11:35 AM) Breast Biopsy Bilateral. Colon Polyp Removal - Colonoscopy Hysterectomy (not due to cancer) - Partial  Diagnostic Studies History Ventura Sellers, [place]; 04/24/2014 11:35 AM) Colonoscopy 5-10 years ago Mammogram 1-3 years ago  Allergies Ventura Sellers, CMA; 04/24/2014 11:38 AM) Latex Clarithromycin *CHEMICALS* Crestor *ANTIHYPERLIPIDEMICS* Statins Depletion *DIETARY PRODUCTS/DIETARY MANAGEMENT PRODUCTS* Ciprofloxacin *CHEMICALS* Sulfonated Castor Oil *DERMATOLOGICALS*  Medication History Ventura Sellers, CMA; 04/24/2014 11:42 AM) Amitriptyline HCl (25MG  Tablet, Oral)  Active. Celecoxib (200MG  Capsule, Oral) Active. Dexamethasone (4MG  Tablet, Oral) Active. Flecainide Acetate (100MG  Tablet, Oral) Active. Gabapentin (300MG  Capsule, Oral) Active. Hydrocodone-Acetaminophen (5-325MG  Tablet, Oral) Active. Metoprolol Succinate ER (50MG  Tablet ER 24HR, Oral) Active. Ondansetron HCl (4MG  Tablet, Oral) Active. Prochlorperazine Maleate (10MG  Tablet, Oral) Active. Aspirin EC (81MG  Tablet DR, Oral) Active. B Complex (Oral) Active. Vitamin D (1000UNIT Capsule, Oral) Active. Magnesium Gluconate (250MG  Tablet, Oral) Active. Omega 3 (1000MG  Capsule, Oral) Active. Valtrex (500MG  Tablet, Oral) Active. Paxil (20MG  Tablet, Oral) Active.  Social History Ventura Sellers, [place]; 04/24/2014 11:35 AM) Caffeine use Coffee, Tea. No alcohol use No drug use Tobacco use Never smoker.  Family History Ventura Sellers, [place]; 04/24/2014 11:35 AM) Alcohol Abuse Family Members In General. Breast Cancer Sister. Colon Polyps Sister. Heart disease in female family member before age 46 Melanoma Mother. Ovarian Cancer Family Members In General.  Pregnancy / Birth History Ventura Sellers, [place]; 04/24/2014 11:35 AM) Age at menarche 7 years. Age of menopause >10 Gravida 2 Maternal age 83-30 Para 2     Review of Systems Sharyn Lull R. Brooks CMA; 04/24/2014 11:35 AM) General Not Present- Appetite Loss, Chills, Fatigue, Fever, Night Sweats, Weight Gain and Weight Loss. Skin Present- Dryness. Not Present- Change in Wart/Mole, Hives, Jaundice, New Lesions, Non-Healing Wounds, Rash and Ulcer. HEENT Present- Seasonal Allergies and Wears glasses/contact lenses. Not Present- Earache, Hearing Loss, Hoarseness, Nose Bleed, Oral Ulcers, Ringing in the Ears, Sinus Pain, Sore Throat, Visual Disturbances and Yellow Eyes. Respiratory Not Present- Bloody sputum, Chronic Cough, Difficulty Breathing, Snoring and Wheezing. Breast Present- Breast Pain. Not Present-  Breast Mass, Nipple Discharge and Skin Changes. Cardiovascular Not Present- Chest Pain, Difficulty Breathing Lying Down, Leg Cramps, Palpitations, Rapid Heart Rate, Shortness of Breath and Swelling of Extremities. Gastrointestinal Present- Excessive gas. Not Present- Abdominal Pain, Bloating, Bloody Stool, Change in Bowel Habits, Chronic diarrhea, Constipation, Difficulty Swallowing,  Gets full quickly at meals, Hemorrhoids, Indigestion, Nausea, Rectal Pain and Vomiting. Female Genitourinary Present- Frequency and Nocturia. Not Present- Painful Urination, Pelvic Pain and Urgency. Musculoskeletal Present- Back Pain and Joint Stiffness. Not Present- Joint Pain, Muscle Pain, Muscle Weakness and Swelling of Extremities. Neurological Present- Headaches. Not Present- Decreased Memory, Fainting, Numbness, Seizures, Tingling, Tremor, Trouble walking and Weakness. Psychiatric Present- Anxiety. Not Present- Bipolar, Change in Sleep Pattern, Depression, Fearful and Frequent crying. Endocrine Present- Hot flashes. Not Present- Cold Intolerance, Excessive Hunger, Hair Changes, Heat Intolerance and New Diabetes. Hematology Not Present- Easy Bruising, Excessive bleeding, Gland problems, HIV and Persistent Infections.  [employer] R. Brooks CMA; 04/24/2014 11:35 AM) 04/24/2014 11:35 AM Weight: 154.25 lb Height: 64in Body Surface Area: 1.78 m Body Mass Index: 26.48 kg/m BP: 126/84 (Sitting, Left Arm, Standard)     Assessment & Plan Tami Hollingshead MD; 04/24/2014 12:02 PM)  POST-OPERATIVE STATE (V45.89  Z98.89) Impression: Assessment: T1cN0 invasive right breast cancer. The wounds are healing well.  Plan: Port-a-cath insertion. The procedure risks and aftercare been explained. Risks include but are not limited to bleeding, infection, malfunction, pneumothorax, wound problems, DVT.

## 2014-04-28 NOTE — Anesthesia Postprocedure Evaluation (Signed)
  Anesthesia Post-op Note  Patient: Tami [name]  Procedure(s) Performed: Procedure(s) (LRB): ULTRASOUND GUIDED PORT A CATHE INSERTION (N/A)  Patient Location: PACU  Anesthesia Type: General  Level of Consciousness: awake and alert   Airway and Oxygen Therapy: Patient Spontanous Breathing  Post-op Pain: mild  Post-op Assessment: Post-op Vital signs reviewed, Patient's Cardiovascular Status Stable, Respiratory Function Stable, Patent Airway and No signs of Nausea or vomiting  Last Vitals:  Filed Vitals:   04/26/14 1700  BP: 136/72  Pulse: 69  Temp: 36.7 C  Resp: 18    Post-op Vital Signs: stable   Complications: No apparent anesthesia complications

## 2014-04-29 ENCOUNTER — Encounter (HOSPITAL_COMMUNITY): Payer: Self-pay | Admitting: General Surgery

## 2014-05-02 ENCOUNTER — Telehealth: Payer: Self-pay | Admitting: *Deleted

## 2014-05-02 NOTE — Telephone Encounter (Signed)
Per pharmacy I have mioved apapt

## 2014-05-06 ENCOUNTER — Telehealth: Payer: Self-pay | Admitting: *Deleted

## 2014-05-06 ENCOUNTER — Other Ambulatory Visit: Payer: Self-pay | Admitting: *Deleted

## 2014-05-06 DIAGNOSIS — C50311 Malignant neoplasm of lower-inner quadrant of right female breast: Secondary | ICD-10-CM

## 2014-05-06 NOTE — Telephone Encounter (Signed)
Patient called wanting to know if she can get her flu shot today. Patient starting first chemo treatment tomorrow. Advised patient to wait to get flu shot. Advised to speak with MD about possibly getting flu shot during off time. Patient verbalized understanding.

## 2014-05-07 ENCOUNTER — Ambulatory Visit (HOSPITAL_BASED_OUTPATIENT_CLINIC_OR_DEPARTMENT_OTHER): Payer: BC Managed Care – PPO

## 2014-05-07 ENCOUNTER — Ambulatory Visit (HOSPITAL_BASED_OUTPATIENT_CLINIC_OR_DEPARTMENT_OTHER): Payer: BC Managed Care – PPO | Admitting: Hematology and Oncology

## 2014-05-07 ENCOUNTER — Telehealth: Payer: Self-pay | Admitting: Hematology and Oncology

## 2014-05-07 ENCOUNTER — Other Ambulatory Visit (HOSPITAL_BASED_OUTPATIENT_CLINIC_OR_DEPARTMENT_OTHER): Payer: BC Managed Care – PPO

## 2014-05-07 VITALS — BP 142/73 | HR 73 | Temp 98.3°F | Resp 18 | Ht 64.0 in | Wt 151.8 lb

## 2014-05-07 DIAGNOSIS — C50311 Malignant neoplasm of lower-inner quadrant of right female breast: Secondary | ICD-10-CM

## 2014-05-07 DIAGNOSIS — Z170 Estrogen receptor positive status [ER+]: Secondary | ICD-10-CM

## 2014-05-07 DIAGNOSIS — Z5111 Encounter for antineoplastic chemotherapy: Secondary | ICD-10-CM

## 2014-05-07 LAB — COMPREHENSIVE METABOLIC PANEL (CC13)
ALBUMIN: 4 g/dL (ref 3.5–5.0)
ALK PHOS: 101 U/L (ref 40–150)
ALT: 30 U/L (ref 0–55)
AST: 19 U/L (ref 5–34)
Anion Gap: 12 mEq/L — ABNORMAL HIGH (ref 3–11)
BUN: 11.6 mg/dL (ref 7.0–26.0)
CHLORIDE: 108 meq/L (ref 98–109)
CO2: 22 mEq/L (ref 22–29)
Calcium: 9.6 mg/dL (ref 8.4–10.4)
Creatinine: 1 mg/dL (ref 0.6–1.1)
GLUCOSE: 132 mg/dL (ref 70–140)
POTASSIUM: 3.5 meq/L (ref 3.5–5.1)
Sodium: 142 mEq/L (ref 136–145)
TOTAL PROTEIN: 7.7 g/dL (ref 6.4–8.3)
Total Bilirubin: 0.41 mg/dL (ref 0.20–1.20)

## 2014-05-07 LAB — CBC WITH DIFFERENTIAL/PLATELET
BASO%: 0 % (ref 0.0–2.0)
BASOS ABS: 0 10*3/uL (ref 0.0–0.1)
EOS ABS: 0 10*3/uL (ref 0.0–0.5)
EOS%: 0 % (ref 0.0–7.0)
HCT: 34.1 % — ABNORMAL LOW (ref 34.8–46.6)
HEMOGLOBIN: 11.6 g/dL (ref 11.6–15.9)
LYMPH#: 1.1 10*3/uL (ref 0.9–3.3)
LYMPH%: 12.6 % — ABNORMAL LOW (ref 14.0–49.7)
MCH: 30.9 pg (ref 25.1–34.0)
MCHC: 34 g/dL (ref 31.5–36.0)
MCV: 90.9 fL (ref 79.5–101.0)
MONO#: 0.3 10*3/uL (ref 0.1–0.9)
MONO%: 3.7 % (ref 0.0–14.0)
NEUT%: 83.7 % — ABNORMAL HIGH (ref 38.4–76.8)
NEUTROS ABS: 7.4 10*3/uL — AB (ref 1.5–6.5)
Platelets: 261 10*3/uL (ref 145–400)
RBC: 3.75 10*6/uL (ref 3.70–5.45)
RDW: 13.3 % (ref 11.2–14.5)
WBC: 8.9 10*3/uL (ref 3.9–10.3)

## 2014-05-07 MED ORDER — HEPARIN SOD (PORK) LOCK FLUSH 100 UNIT/ML IV SOLN
500.0000 [IU] | Freq: Once | INTRAVENOUS | Status: AC | PRN
  Administered 2014-05-07: 500 [IU]
  Filled 2014-05-07: qty 5

## 2014-05-07 MED ORDER — SODIUM CHLORIDE 0.9 % IV SOLN
600.0000 mg/m2 | Freq: Once | INTRAVENOUS | Status: AC
  Administered 2014-05-07: 1060 mg via INTRAVENOUS
  Filled 2014-05-07: qty 53

## 2014-05-07 MED ORDER — DEXAMETHASONE SODIUM PHOSPHATE 20 MG/5ML IJ SOLN
20.0000 mg | Freq: Once | INTRAMUSCULAR | Status: AC
  Administered 2014-05-07: 20 mg via INTRAVENOUS

## 2014-05-07 MED ORDER — ONDANSETRON 16 MG/50ML IVPB (CHCC)
INTRAVENOUS | Status: AC
  Filled 2014-05-07: qty 16

## 2014-05-07 MED ORDER — DEXAMETHASONE SODIUM PHOSPHATE 20 MG/5ML IJ SOLN
INTRAMUSCULAR | Status: AC
  Filled 2014-05-07: qty 5

## 2014-05-07 MED ORDER — ONDANSETRON 16 MG/50ML IVPB (CHCC)
16.0000 mg | Freq: Once | INTRAVENOUS | Status: AC
  Administered 2014-05-07: 16 mg via INTRAVENOUS

## 2014-05-07 MED ORDER — SODIUM CHLORIDE 0.9 % IV SOLN
Freq: Once | INTRAVENOUS | Status: AC
  Administered 2014-05-07: 09:00:00 via INTRAVENOUS

## 2014-05-07 MED ORDER — SODIUM CHLORIDE 0.9 % IJ SOLN
10.0000 mL | INTRAMUSCULAR | Status: DC | PRN
  Administered 2014-05-07: 10 mL
  Filled 2014-05-07: qty 10

## 2014-05-07 MED ORDER — DOCETAXEL CHEMO INJECTION 160 MG/16ML
75.0000 mg/m2 | Freq: Once | INTRAVENOUS | Status: AC
  Administered 2014-05-07: 130 mg via INTRAVENOUS
  Filled 2014-05-07: qty 13

## 2014-05-07 NOTE — Patient Instructions (Signed)
[facility] Discharge Instructions for Patients Receiving Chemotherapy  Today you received the following chemotherapy agents taxotere/cytoxan  To help prevent nausea and vomiting after your treatment, we encourage you to take your nausea medication as directed   If you develop nausea and vomiting that is not controlled by your nausea medication, call the clinic.   BELOW ARE SYMPTOMS THAT SHOULD BE REPORTED IMMEDIATELY:  *FEVER GREATER THAN 100.5 F  *CHILLS WITH OR WITHOUT FEVER  NAUSEA AND VOMITING THAT IS NOT CONTROLLED WITH YOUR NAUSEA MEDICATION  *UNUSUAL SHORTNESS OF BREATH  *UNUSUAL BRUISING OR BLEEDING  TENDERNESS IN MOUTH AND THROAT WITH OR WITHOUT PRESENCE OF ULCERS  *URINARY PROBLEMS  *BOWEL PROBLEMS  UNUSUAL RASH Items with * indicate a potential emergency and should be followed up as soon as possible.  Feel free to call the clinic you have any questions or concerns. The clinic phone number is [phone].  Docetaxel injection What is this medicine? DOCETAXEL (doe se TAX el) is a chemotherapy drug. It targets fast dividing cells, like cancer cells, and causes these cells to die. This medicine is used to treat many types of cancers like breast cancer, certain stomach cancers, head and neck cancer, lung cancer, and prostate cancer. This medicine may be used for other purposes; ask your health care provider or pharmacist if you have questions. COMMON BRAND NAME(S): Docefrez, Taxotere What should I tell my health care provider before I take this medicine? They need to know if you have any of these conditions: -infection (especially a virus infection such as chickenpox, cold sores, or herpes) -liver disease -low blood counts, like low white cell, platelet, or red cell counts -an unusual or allergic reaction to docetaxel, polysorbate 80, other chemotherapy agents, other medicines, foods, dyes, or preservatives -pregnant or trying to get  pregnant -breast-feeding How should I use this medicine? This drug is given as an infusion into a vein. It is administered in a hospital or clinic by a specially trained health care professional. Talk to your pediatrician regarding the use of this medicine in children. Special care may be needed. Overdosage: If you think you have taken too much of this medicine contact a poison control center or emergency room at once. NOTE: This medicine is only for you. Do not share this medicine with others. What if I miss a dose? It is important not to miss your dose. Call your doctor or health care professional if you are unable to keep an appointment. What may interact with this medicine? -cyclosporine -erythromycin -ketoconazole -medicines to increase blood counts like filgrastim, pegfilgrastim, sargramostim -vaccines Talk to your doctor or health care professional before taking any of these medicines: -acetaminophen -aspirin -ibuprofen -ketoprofen -naproxen This list may not describe all possible interactions. Give your health care provider a list of all the medicines, herbs, non-prescription drugs, or dietary supplements you use. Also tell them if you smoke, drink alcohol, or use illegal drugs. Some items may interact with your medicine. What should I watch for while using this medicine? Your condition will be monitored carefully while you are receiving this medicine. You will need important blood work done while you are taking this medicine. This drug may make you feel generally unwell. This is not uncommon, as chemotherapy can affect healthy cells as well as cancer cells. Report any side effects. Continue your course of treatment even though you feel ill unless your doctor tells you to stop. In some cases, you may be given additional medicines to help   with side effects. Follow all directions for their use. Call your doctor or health care professional for advice if you get a fever, chills or sore  throat, or other symptoms of a cold or flu. Do not treat yourself. This drug decreases your body's ability to fight infections. Try to avoid being around people who are sick. This medicine may increase your risk to bruise or bleed. Call your doctor or health care professional if you notice any unusual bleeding. Be careful brushing and flossing your teeth or using a toothpick because you may get an infection or bleed more easily. If you have any dental work done, tell your dentist you are receiving this medicine. Avoid taking products that contain aspirin, acetaminophen, ibuprofen, naproxen, or ketoprofen unless instructed by your doctor. These medicines may hide a fever. This medicine contains an alcohol in the product. You may get drowsy or dizzy. Do not drive, use machinery, or do anything that needs mental alertness until you know how this medicine affects you. Do not stand or sit up quickly, especially if you are an older patient. This reduces the risk of dizzy or fainting spells. Avoid alcoholic drinks Do not become pregnant while taking this medicine. Women should inform their doctor if they wish to become pregnant or think they might be pregnant. There is a potential for serious side effects to an unborn child. Talk to your health care professional or pharmacist for more information. Do not breast-feed an infant while taking this medicine. What side effects may I notice from receiving this medicine? Side effects that you should report to your doctor or health care professional as soon as possible: -allergic reactions like skin rash, itching or hives, swelling of the face, lips, or tongue -low blood counts - This drug may decrease the number of white blood cells, red blood cells and platelets. You may be at increased risk for infections and bleeding. -signs of infection - fever or chills, cough, sore throat, pain or difficulty passing urine -signs of decreased platelets or bleeding - bruising,  pinpoint red spots on the skin, black, tarry stools, nosebleeds -signs of decreased red blood cells - unusually weak or tired, fainting spells, lightheadedness -breathing problems -fast or irregular heartbeat -low blood pressure -mouth sores -nausea and vomiting -pain, swelling, redness or irritation at the injection site -pain, tingling, numbness in the hands or feet -swelling of the ankle, feet, hands -weight gain Side effects that usually do not require medical attention (report to your prescriber or health care professional if they continue or are bothersome): -bone pain -complete hair loss including hair on your head, underarms, pubic hair, eyebrows, and eyelashes -diarrhea -excessive tearing -changes in the color of fingernails -loosening of the fingernails -nausea -muscle pain -red flush to skin -sweating -weak or tired This list may not describe all possible side effects. Call your doctor for medical advice about side effects. You may report side effects to FDA at 1-800-FDA-1088. Where should I keep my medicine? This drug is given in a hospital or clinic and will not be stored at home. NOTE: This sheet is a summary. It may not cover all possible information. If you have questions about this medicine, talk to your doctor, pharmacist, or health care provider.  2015, Elsevier/Gold Standard. (2013-05-17 22:21:02)  Cyclophosphamide injection What is this medicine? CYCLOPHOSPHAMIDE (sye kloe FOSS fa mide) is a chemotherapy drug. It slows the growth of cancer cells. This medicine is used to treat many types of cancer like lymphoma, myeloma, leukemia, breast   cancer, and ovarian cancer, to name a few. This medicine may be used for other purposes; ask your health care provider or pharmacist if you have questions. COMMON BRAND NAME(S): Cytoxan, Neosar What should I tell my health care provider before I take this medicine? They need to know if you have any of these conditions: -blood  disorders -history of other chemotherapy -infection -kidney disease -liver disease -recent or ongoing radiation therapy -tumors in the bone marrow -an unusual or allergic reaction to cyclophosphamide, other chemotherapy, other medicines, foods, dyes, or preservatives -pregnant or trying to get pregnant -breast-feeding How should I use this medicine? This drug is usually given as an injection into a vein or muscle or by infusion into a vein. It is administered in a hospital or clinic by a specially trained health care professional. Talk to your pediatrician regarding the use of this medicine in children. Special care may be needed. Overdosage: If you think you have taken too much of this medicine contact a poison control center or emergency room at once. NOTE: This medicine is only for you. Do not share this medicine with others. What if I miss a dose? It is important not to miss your dose. Call your doctor or health care professional if you are unable to keep an appointment. What may interact with this medicine? This medicine may interact with the following medications: -amiodarone -amphotericin B -azathioprine -certain antiviral medicines for HIV or AIDS such as protease inhibitors (e.g., indinavir, ritonavir) and zidovudine -certain blood pressure medications such as benazepril, captopril, enalapril, fosinopril, lisinopril, moexipril, monopril, perindopril, quinapril, ramipril, trandolapril -certain cancer medications such as anthracyclines (e.g., daunorubicin, doxorubicin), busulfan, cytarabine, paclitaxel, pentostatin, tamoxifen, trastuzumab -certain diuretics such as chlorothiazide, chlorthalidone, hydrochlorothiazide, indapamide, metolazone -certain medicines that treat or prevent blood clots like warfarin -certain muscle relaxants such as succinylcholine -cyclosporine -etanercept -indomethacin -medicines to increase blood counts like filgrastim, pegfilgrastim,  sargramostim -medicines used as general anesthesia -metronidazole -natalizumab This list may not describe all possible interactions. Give your health care provider a list of all the medicines, herbs, non-prescription drugs, or dietary supplements you use. Also tell them if you smoke, drink alcohol, or use illegal drugs. Some items may interact with your medicine. What should I watch for while using this medicine? Visit your doctor for checks on your progress. This drug may make you feel generally unwell. This is not uncommon, as chemotherapy can affect healthy cells as well as cancer cells. Report any side effects. Continue your course of treatment even though you feel ill unless your doctor tells you to stop. Drink water or other fluids as directed. Urinate often, even at night. In some cases, you may be given additional medicines to help with side effects. Follow all directions for their use. Call your doctor or health care professional for advice if you get a fever, chills or sore throat, or other symptoms of a cold or flu. Do not treat yourself. This drug decreases your body's ability to fight infections. Try to avoid being around people who are sick. This medicine may increase your risk to bruise or bleed. Call your doctor or health care professional if you notice any unusual bleeding. Be careful brushing and flossing your teeth or using a toothpick because you may get an infection or bleed more easily. If you have any dental work done, tell your dentist you are receiving this medicine. You may get drowsy or dizzy. Do not drive, use machinery, or do anything that needs mental alertness until you know   how this medicine affects you. Do not become pregnant while taking this medicine or for 1 year after stopping it. Women should inform their doctor if they wish to become pregnant or think they might be pregnant. Men should not father a child while taking this medicine and for 4 months after stopping  it. There is a potential for serious side effects to an unborn child. Talk to your health care professional or pharmacist for more information. Do not breast-feed an infant while taking this medicine. This medicine may interfere with the ability to have a child. This medicine has caused ovarian failure in some women. This medicine has caused reduced sperm counts in some men. You should talk with your doctor or health care professional if you are concerned about your fertility. If you are going to have surgery, tell your doctor or health care professional that you have taken this medicine. What side effects may I notice from receiving this medicine? Side effects that you should report to your doctor or health care professional as soon as possible: -allergic reactions like skin rash, itching or hives, swelling of the face, lips, or tongue -low blood counts - this medicine may decrease the number of white blood cells, red blood cells and platelets. You may be at increased risk for infections and bleeding. -signs of infection - fever or chills, cough, sore throat, pain or difficulty passing urine -signs of decreased platelets or bleeding - bruising, pinpoint red spots on the skin, black, tarry stools, blood in the urine -signs of decreased red blood cells - unusually weak or tired, fainting spells, lightheadedness -breathing problems -dark urine -dizziness -palpitations -swelling of the ankles, feet, hands -trouble passing urine or change in the amount of urine -weight gain -yellowing of the eyes or skin Side effects that usually do not require medical attention (report to your doctor or health care professional if they continue or are bothersome): -changes in nail or skin color -hair loss -missed menstrual periods -mouth sores -nausea, vomiting This list may not describe all possible side effects. Call your doctor for medical advice about side effects. You may report side effects to FDA at  1-800-FDA-1088. Where should I keep my medicine? This drug is given in a hospital or clinic and will not be stored at home. NOTE: This sheet is a summary. It may not cover all possible information. If you have questions about this medicine, talk to your doctor, pharmacist, or health care provider.  2015, Elsevier/Gold Standard. (2012-05-05 16:22:58)  

## 2014-05-07 NOTE — Telephone Encounter (Signed)
, °

## 2014-05-07 NOTE — Progress Notes (Signed)
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 No right axillary lymphadenopathy.    02/14/2014 Initial Diagnosis Breast cancer-right Er 100%, PR 94%, Ki 67: 17%; Her2 Neg ratio 1.74; T1bN0Mo (Clinical Stage 1A)   04/02/2014 Surgery Right breast lumpectomy: Invasive ductal carcinoma negative for lymph vascular invasion, 1.6 cm, grade 2, 3 SLN negative, reexcision margins benign, ER 100%, PR 94%, HER-2 negative Ki-67 70% T1 C. N0 M0 stage IA: Oncotype DX 29 (19%ROR)    CHIEF COMPLIANT: cycle 1 day 1Taxotere Cytoxan  INTERVAL HISTORY: Tami [name] is a39 year old American lady with above-mentioned history of breast cancer who had right breast lumpectomy and had intermediate risk Oncotype DX recurrence score and after much consideration, we elected to give her or cycles of Taxotere Cytoxan adjuvant systemic chemotherapy. She is here today for cycle 1 of treatment. She had been through chemotherapy patient port placement. Her surgical scars are well-healed up for a well. She is anxious to get started with her treatment.she filled all of her medications and knows how to take antiemetics.  REVIEW OF SYSTEMS:   Constitutional: Denies fevers, chills or abnormal weight loss Eyes: Denies blurriness of vision Ears, nose, mouth, throat, and face: Denies mucositis or sore  throat Respiratory: Denies cough, dyspnea or wheezes Cardiovascular: Denies palpitation, chest discomfort or lower extremity swelling Gastrointestinal:  Denies nausea, heartburn or change in bowel habits Skin: Denies abnormal skin rashes Lymphatics: Denies new lymphadenopathy or easy bruising Neurological:Denies numbness, tingling or new weaknesses Behavioral/Psych: Mood is stable, no new changes  Breast:  denies any pain or lumps or nodules in either breasts All other systems were reviewed with the patient and are negative.  I have reviewed the past medical history, past surgical history, social history and family history with the patient and they are unchanged from previous note.  ALLERGIES:  is allergic to latex; sulfonamide derivatives; clarithromycin; crestor; statins; and ciprofloxacin hcl.  MEDICATIONS:  Current Outpatient Prescriptions  Medication Sig Dispense Refill  . amitriptyline (ELAVIL) 25 MG tablet Take 25 mg by mouth at bedtime as needed for sleep. When patient has migraines    . aspirin EC 81 MG tablet Take 81 mg by mouth daily.    . celecoxib (CELEBREX) 200 MG capsule Take 200 mg by mouth 2 (two) times daily as needed for moderate pain.    [name] dexamethasone (DECADRON) 4 MG tablet Take 2 tablets (8 mg total) by mouth 2 (two) times daily. Start the day before Taxotere. Then again the day after chemo for 3 days. 30 tablet 1  . flecainide (TAMBOCOR) 100 MG tablet Take 0.5 tablets (50 mg total) by mouth 2 (two) times daily. 90 tablet 3  . gabapentin (NEURONTIN) 300 MG capsule Take 1 capsule (300 mg total) by mouth 3 (three) times daily. PATIENT NEEDS OFFICE VISIT FOR ADDITIONAL REFILLS 90  capsule 0  . HYDROcodone-acetaminophen (NORCO) 5-325 MG per tablet Take 1-2 tablets by mouth every 6 (six) hours as needed. 20 tablet 0  . ibuprofen (ADVIL,MOTRIN) 200 MG tablet Take 200 mg by mouth every 6 (six) hours as needed for headache or moderate pain.    [name] lidocaine-prilocaine (EMLA)  cream Apply 1 application topically as needed. 30 g 0  . LORazepam (ATIVAN) 0.5 MG tablet Take 1 tablet (0.5 mg total) by mouth every 6 (six) hours as needed (Nausea or vomiting). 30 tablet 0  . metoprolol succinate (TOPROL-XL) 50 MG 24 hr tablet Take 1 tablet (50 mg total) by mouth daily. Take with or immediately following a meal. 90 tablet 3  . ondansetron (ZOFRAN) 4 MG tablet   0  . ondansetron (ZOFRAN) 8 MG tablet Take 1 tablet (8 mg total) by mouth 2 (two) times daily. Start the day after chemo for 3 days. Then take as needed for nausea or vomiting. 30 tablet 1  . PARoxetine (PAXIL) 20 MG tablet Take 1 tablet (20 mg total) by mouth daily. 90 tablet 2  . prochlorperazine (COMPAZINE) 10 MG tablet Take 1 tablet (10 mg total) by mouth every 6 (six) hours as needed (Nausea or vomiting). 30 tablet 1  . valACYclovir (VALTREX) 500 MG tablet Take 1 tablet (500 mg total) by mouth 2 (two) times daily as needed. 60 tablet 11   No current facility-administered medications for this visit.    PHYSICAL EXAMINATION: ECOG PERFORMANCE STATUS: 0 - Asymptomatic  Filed Vitals:   05/07/14 0833  BP: 142/73  Pulse: 73  Temp: 98.3 F (36.8 C)  Resp: 18   Filed Weights   05/07/14 0833  Weight: 151 lb 12.8 oz (68.856 kg)    GENERAL:alert, no distress and comfortable SKIN: skin color, texture, turgor are normal, no rashes or significant lesions EYES: normal, Conjunctiva are pink and non-injected, sclera clear OROPHARYNX:no exudate, no erythema and lips, buccal mucosa, and tongue normal  NECK: supple, thyroid normal size, non-tender, without nodularity LYMPH:  no palpable lymphadenopathy in the cervical, axillary or inguinal LUNGS: clear to auscultation and percussion with normal breathing effort HEART: regular rate & rhythm and no murmurs and no lower extremity edema ABDOMEN:abdomen soft, non-tender and normal bowel sounds Musculoskeletal:no cyanosis of digits and no clubbing  NEURO: alert & oriented x  3 with fluent speech, no focal motor/sensory deficits BREAST:scars in the breasts are healed well   LABORATORY DATA:  I have reviewed the data as listed   Chemistry      Component Value Date/Time   NA 144 03/28/2014 1145   K 4.1 03/28/2014 1145   CL 106 03/28/2014 1145   CO2 25 03/28/2014 1145   BUN 10 03/28/2014 1145   CREATININE 0.77 03/28/2014 1145   CREATININE 0.83 03/27/2014 1102      Component Value Date/Time   CALCIUM 9.6 03/28/2014 1145   ALKPHOS 85 03/28/2014 1145   AST 21 03/28/2014 1145   ALT 27 03/28/2014 1145   BILITOT 0.3 03/28/2014 1145       Lab Results  Component Value Date   WBC 8.9 05/07/2014   HGB 11.6 05/07/2014   HCT 34.1* 05/07/2014   MCV 90.9 05/07/2014   PLT 261 05/07/2014   NEUTROABS 7.4* 05/07/2014     RADIOGRAPHIC STUDIES: I have personally reviewed the radiology reports and agreed with their findings. No results found.   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 N0 M0 stage IA status post lumpectomy, Oncotype DX recurrence score 29 intermediate risk 19% risk of recurrence with hormonal therapy alone  Treatment plan: Taxotere, Cytoxan every 3 weeks x4 cycles, today is cycle 1 day 1 Patient underwent port placement as well as chemotherapy education class and is ready to start treatment today. I reviewed her blood work that is adequate to receive treatment. Consent has been obtained and all the patient's questions have been answered.  Antiemetics regimen was discussed with the patient in great detail. Return to clinic in one week for toxicity check and nadir count check.   Orders Placed This Encounter  Procedures  . CBC with Differential    Standing Status: Future     Number of Occurrences:      Standing Expiration Date: 05/07/2015  . Comprehensive metabolic panel (Cmet) - [facility]    Standing Status: Future      Number of Occurrences:      Standing Expiration Date: 05/07/2015   The patient has a good understanding of the overall plan. she agrees with it. She will call with any problems that may develop before her next visit here. Consent was signed I spent 20 minutes counseling the patient face to face. The total time spent in the appointment was 25 minutes and more than 50% was on counseling and review of test results    Rulon Eisenmenger, MD who  05/07/2014 8:45 AM

## 2014-05-07 NOTE — Assessment & Plan Note (Signed)
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 status post lumpectomy, Oncotype DX recurrence score 29 intermediate risk 19% risk of recurrence with hormonal therapy alone  Treatment plan: Taxotere, Cytoxan every 3 weeks x4 cycles, today is cycle 1 day 1 Patient underwent port placement as well as chemotherapy education class and is ready to start treatment today. I reviewed her blood work that is adequate to receive treatment. Consent has been obtained and all the patient's questions have been answered.  Antiemetics regimen was discussed with the patient in great detail. Return to clinic in one week for toxicity check and nadir count check.

## 2014-05-08 ENCOUNTER — Ambulatory Visit (HOSPITAL_BASED_OUTPATIENT_CLINIC_OR_DEPARTMENT_OTHER): Payer: BC Managed Care – PPO

## 2014-05-08 ENCOUNTER — Telehealth: Payer: Self-pay

## 2014-05-08 DIAGNOSIS — Z5189 Encounter for other specified aftercare: Secondary | ICD-10-CM

## 2014-05-08 DIAGNOSIS — C50311 Malignant neoplasm of lower-inner quadrant of right female breast: Secondary | ICD-10-CM

## 2014-05-08 MED ORDER — PEGFILGRASTIM INJECTION 6 MG/0.6ML ~~LOC~~
6.0000 mg | PREFILLED_SYRINGE | Freq: Once | SUBCUTANEOUS | Status: AC
  Administered 2014-05-08: 6 mg via SUBCUTANEOUS
  Filled 2014-05-08: qty 0.6

## 2014-05-08 NOTE — Patient Instructions (Signed)
Pegfilgrastim injection What is this medicine? PEGFILGRASTIM (peg fil GRA stim) is a long-acting granulocyte colony-stimulating factor that stimulates the growth of neutrophils, a type of white blood cell important in the body's fight against infection. It is used to reduce the incidence of fever and infection in patients with certain types of cancer who are receiving chemotherapy that affects the bone marrow. This medicine may be used for other purposes; ask your health care provider or pharmacist if you have questions. COMMON BRAND NAME(S): Neulasta What should I tell my health care provider before I take this medicine? They need to know if you have any of these conditions: -latex allergy -ongoing radiation therapy -sickle cell disease -skin reactions to acrylic adhesives (On-Body Injector only) -an unusual or allergic reaction to pegfilgrastim, filgrastim, other medicines, foods, dyes, or preservatives -pregnant or trying to get pregnant -breast-feeding How should I use this medicine? This medicine is for injection under the skin. If you get this medicine at home, you will be taught how to prepare and give the pre-filled syringe or how to use the On-body Injector. Refer to the patient Instructions for Use for detailed instructions. Use exactly as directed. Take your medicine at regular intervals. Do not take your medicine more often than directed. It is important that you put your used needles and syringes in a special sharps container. Do not put them in a trash can. If you do not have a sharps container, call your pharmacist or healthcare provider to get one. Talk to your pediatrician regarding the use of this medicine in children. Special care may be needed. Overdosage: If you think you have taken too much of this medicine contact a poison control center or emergency room at once. NOTE: This medicine is only for you. Do not share this medicine with others. What if I miss a dose? It is  important not to miss your dose. Call your doctor or health care professional if you miss your dose. If you miss a dose due to an On-body Injector failure or leakage, a new dose should be administered as soon as possible using a single prefilled syringe for manual use. What may interact with this medicine? Interactions have not been studied. Give your health care provider a list of all the medicines, herbs, non-prescription drugs, or dietary supplements you use. Also tell them if you smoke, drink alcohol, or use illegal drugs. Some items may interact with your medicine. This list may not describe all possible interactions. Give your health care provider a list of all the medicines, herbs, non-prescription drugs, or dietary supplements you use. Also tell them if you smoke, drink alcohol, or use illegal drugs. Some items may interact with your medicine. What should I watch for while using this medicine? You may need blood work done while you are taking this medicine. If you are going to need a MRI, CT scan, or other procedure, tell your doctor that you are using this medicine (On-Body Injector only). What side effects may I notice from receiving this medicine? Side effects that you should report to your doctor or health care professional as soon as possible: -allergic reactions like skin rash, itching or hives, swelling of the face, lips, or tongue -dizziness -fever -pain, redness, or irritation at site where injected -pinpoint red spots on the skin -shortness of breath or breathing problems -stomach or side pain, or pain at the shoulder -swelling -tiredness -trouble passing urine Side effects that usually do not require medical attention (report to your doctor   or health care professional if they continue or are bothersome): -bone pain -muscle pain This list may not describe all possible side effects. Call your doctor for medical advice about side effects. You may report side effects to FDA at  1-800-FDA-1088. Where should I keep my medicine? Keep out of the reach of children. Store pre-filled syringes in a refrigerator between 2 and 8 degrees C (36 and 46 degrees F). Do not freeze. Keep in carton to protect from light. Throw away this medicine if it is left out of the refrigerator for more than 48 hours. Throw away any unused medicine after the expiration date. NOTE: This sheet is a summary. It may not cover all possible information. If you have questions about this medicine, talk to your doctor, pharmacist, or health care provider.  2015, [employer]. (2013-09-20 16:14:05)  

## 2014-05-08 NOTE — Telephone Encounter (Signed)
Patient states she feels well after first chemo treatment yesterday.  Denies any pain, nausea or vomiting, mouth sores, diarrhea or constipation. States she was able to tolerate 2L of water yesterday.  Understands she should call back with any fever >100.5 or any other questions or concerns.

## 2014-05-10 ENCOUNTER — Ambulatory Visit: Payer: BC Managed Care – PPO | Admitting: Oncology

## 2014-05-10 ENCOUNTER — Other Ambulatory Visit: Payer: BC Managed Care – PPO

## 2014-05-10 ENCOUNTER — Telehealth: Payer: Self-pay

## 2014-05-10 ENCOUNTER — Telehealth: Payer: Self-pay | Admitting: *Deleted

## 2014-05-10 NOTE — Telephone Encounter (Signed)
Pt called in re: pain meds.  Before call back, pt called [place] Triage.  Neilsa to provide pt response.

## 2014-05-10 NOTE — Telephone Encounter (Signed)
SEVERE BODY PAIN AT A SCALE OF EIGHT. PT. HAS TAKEN CLARITIN 05/09/14 AND 05/10/14. SHE IS USING TYLENOL EXTRA STRENGTH WHICH REDUCES HER PAIN TO A SIX. SPOKE TO [place]. PT. TO CONTINUE CLARITIN DAILY UNTIL SHE IS NOT HURTING. PT. MAY USE THE EXTRA STRENGTH TYLENOL BETWEEN HER HYDROCODONE/ACETAMINOPHEN 5/325MG . PT. WAS CAUTIONED NOT TO EXCEED 4,000MG  OF ACETAMINOPHEN IN A 24 HOUR PERIOD. PT. WROTE DOWN THE INSTRUCTIONS AND USED TEACH BACK METHOD.

## 2014-05-11 ENCOUNTER — Ambulatory Visit: Payer: BC Managed Care – PPO

## 2014-05-13 ENCOUNTER — Telehealth: Payer: Self-pay

## 2014-05-13 NOTE — Telephone Encounter (Signed)
Triage Call Report rcvd from Call A Nurse dtd 11/8/ :38 am re: pain.  Sent to scan.   Called patient to check on her.  Neulasta Wed.  Pain slightly better today, mouth is white and painful.  Pt in tomorrow for nadir check.  Dr Lindi Adie notified.

## 2014-05-14 ENCOUNTER — Other Ambulatory Visit: Payer: Self-pay

## 2014-05-14 ENCOUNTER — Other Ambulatory Visit (HOSPITAL_BASED_OUTPATIENT_CLINIC_OR_DEPARTMENT_OTHER): Payer: BC Managed Care – PPO

## 2014-05-14 ENCOUNTER — Encounter (HOSPITAL_COMMUNITY): Payer: Self-pay | Admitting: Internal Medicine

## 2014-05-14 ENCOUNTER — Ambulatory Visit (HOSPITAL_BASED_OUTPATIENT_CLINIC_OR_DEPARTMENT_OTHER): Payer: BC Managed Care – PPO | Admitting: Hematology and Oncology

## 2014-05-14 ENCOUNTER — Inpatient Hospital Stay (HOSPITAL_COMMUNITY)
Admission: AD | Admit: 2014-05-14 | Discharge: 2014-05-17 | DRG: 641 | Disposition: A | Discharge status: Home / Self Care | Payer: BC Managed Care – PPO | Source: Ambulatory Visit | Attending: Internal Medicine | Admitting: Internal Medicine

## 2014-05-14 ENCOUNTER — Inpatient Hospital Stay (HOSPITAL_COMMUNITY): Payer: BC Managed Care – PPO

## 2014-05-14 VITALS — BP 111/57 | HR 70 | Temp 98.0°F | Resp 20 | Ht 64.0 in | Wt 156.5 lb

## 2014-05-14 DIAGNOSIS — K279 Peptic ulcer, site unspecified, unspecified as acute or chronic, without hemorrhage or perforation: Secondary | ICD-10-CM | POA: Diagnosis present

## 2014-05-14 DIAGNOSIS — B370 Candidal stomatitis: Secondary | ICD-10-CM | POA: Diagnosis present

## 2014-05-14 DIAGNOSIS — M810 Age-related osteoporosis without current pathological fracture: Secondary | ICD-10-CM | POA: Diagnosis present

## 2014-05-14 DIAGNOSIS — D72829 Elevated white blood cell count, unspecified: Secondary | ICD-10-CM | POA: Diagnosis present

## 2014-05-14 DIAGNOSIS — Z8711 Personal history of peptic ulcer disease: Secondary | ICD-10-CM

## 2014-05-14 DIAGNOSIS — Z23 Encounter for immunization: Secondary | ICD-10-CM | POA: Diagnosis not present

## 2014-05-14 DIAGNOSIS — C50311 Malignant neoplasm of lower-inner quadrant of right female breast: Secondary | ICD-10-CM | POA: Diagnosis present

## 2014-05-14 DIAGNOSIS — I480 Paroxysmal atrial fibrillation: Secondary | ICD-10-CM | POA: Diagnosis present

## 2014-05-14 DIAGNOSIS — B3781 Candidal esophagitis: Secondary | ICD-10-CM | POA: Diagnosis present

## 2014-05-14 DIAGNOSIS — R109 Unspecified abdominal pain: Secondary | ICD-10-CM | POA: Insufficient documentation

## 2014-05-14 DIAGNOSIS — F419 Anxiety disorder, unspecified: Secondary | ICD-10-CM | POA: Diagnosis present

## 2014-05-14 DIAGNOSIS — Z79899 Other long term (current) drug therapy: Secondary | ICD-10-CM

## 2014-05-14 DIAGNOSIS — Z7982 Long term (current) use of aspirin: Secondary | ICD-10-CM | POA: Diagnosis not present

## 2014-05-14 DIAGNOSIS — C50319 Malignant neoplasm of lower-inner quadrant of unspecified female breast: Secondary | ICD-10-CM | POA: Diagnosis present

## 2014-05-14 DIAGNOSIS — D649 Anemia, unspecified: Secondary | ICD-10-CM | POA: Diagnosis present

## 2014-05-14 DIAGNOSIS — K589 Irritable bowel syndrome without diarrhea: Secondary | ICD-10-CM | POA: Diagnosis present

## 2014-05-14 DIAGNOSIS — R1013 Epigastric pain: Secondary | ICD-10-CM | POA: Diagnosis present

## 2014-05-14 DIAGNOSIS — R52 Pain, unspecified: Secondary | ICD-10-CM | POA: Diagnosis present

## 2014-05-14 DIAGNOSIS — E860 Dehydration: Principal | ICD-10-CM | POA: Diagnosis present

## 2014-05-14 DIAGNOSIS — B379 Candidiasis, unspecified: Secondary | ICD-10-CM

## 2014-05-14 DIAGNOSIS — Z803 Family history of malignant neoplasm of breast: Secondary | ICD-10-CM | POA: Diagnosis not present

## 2014-05-14 DIAGNOSIS — R7401 Elevation of levels of liver transaminase levels: Secondary | ICD-10-CM | POA: Diagnosis present

## 2014-05-14 DIAGNOSIS — R740 Nonspecific elevation of levels of transaminase and lactic acid dehydrogenase [LDH]: Secondary | ICD-10-CM

## 2014-05-14 DIAGNOSIS — R945 Abnormal results of liver function studies: Secondary | ICD-10-CM

## 2014-05-14 DIAGNOSIS — R1084 Generalized abdominal pain: Secondary | ICD-10-CM

## 2014-05-14 DIAGNOSIS — M1990 Unspecified osteoarthritis, unspecified site: Secondary | ICD-10-CM | POA: Diagnosis present

## 2014-05-14 DIAGNOSIS — R7989 Other specified abnormal findings of blood chemistry: Secondary | ICD-10-CM | POA: Insufficient documentation

## 2014-05-14 DIAGNOSIS — E785 Hyperlipidemia, unspecified: Secondary | ICD-10-CM | POA: Diagnosis present

## 2014-05-14 DIAGNOSIS — C50919 Malignant neoplasm of unspecified site of unspecified female breast: Secondary | ICD-10-CM

## 2014-05-14 LAB — COMPREHENSIVE METABOLIC PANEL (CC13)
ALBUMIN: 2.9 g/dL — AB (ref 3.5–5.0)
ALK PHOS: 129 U/L (ref 40–150)
ALT: 138 U/L — AB (ref 0–55)
AST: 72 U/L — ABNORMAL HIGH (ref 5–34)
Anion Gap: 5 mEq/L (ref 3–11)
BUN: 8.4 mg/dL (ref 7.0–26.0)
CO2: 29 meq/L (ref 22–29)
Calcium: 9.2 mg/dL (ref 8.4–10.4)
Chloride: 105 mEq/L (ref 98–109)
Creatinine: 0.8 mg/dL (ref 0.6–1.1)
Glucose: 108 mg/dl (ref 70–140)
POTASSIUM: 3.6 meq/L (ref 3.5–5.1)
SODIUM: 140 meq/L (ref 136–145)
TOTAL PROTEIN: 6.2 g/dL — AB (ref 6.4–8.3)
Total Bilirubin: 0.29 mg/dL (ref 0.20–1.20)

## 2014-05-14 LAB — URINALYSIS, ROUTINE W REFLEX MICROSCOPIC
BILIRUBIN URINE: NEGATIVE
GLUCOSE, UA: NEGATIVE mg/dL
Ketones, ur: NEGATIVE mg/dL
Leukocytes, UA: NEGATIVE
Nitrite: NEGATIVE
PH: 6.5 (ref 5.0–8.0)
Protein, ur: NEGATIVE mg/dL
Specific Gravity, Urine: >1.046 — ABNORMAL HIGH (ref 1.005–1.030)
Urobilinogen, UA: 0.2 mg/dL (ref 0.0–1.0)

## 2014-05-14 LAB — CBC WITH DIFFERENTIAL/PLATELET
BASO%: 0.5 % (ref 0.0–2.0)
Basophils Absolute: 0 10*3/uL (ref 0.0–0.1)
EOS%: 0.1 % (ref 0.0–7.0)
Eosinophils Absolute: 0 10*3/uL (ref 0.0–0.5)
HCT: 34.9 % (ref 34.8–46.6)
HGB: 11.5 g/dL — ABNORMAL LOW (ref 11.6–15.9)
LYMPH%: 15.3 % (ref 14.0–49.7)
MCH: 30.8 pg (ref 25.1–34.0)
MCHC: 33.1 g/dL (ref 31.5–36.0)
MCV: 93.1 fL (ref 79.5–101.0)
MONO#: 0.6 10*3/uL (ref 0.1–0.9)
MONO%: 8.6 % (ref 0.0–14.0)
NEUT%: 75.5 % (ref 38.4–76.8)
NEUTROS ABS: 5.2 10*3/uL (ref 1.5–6.5)
PLATELETS: 206 10*3/uL (ref 145–400)
RBC: 3.75 10*6/uL (ref 3.70–5.45)
RDW: 13.1 % (ref 11.2–14.5)
WBC: 6.9 10*3/uL (ref 3.9–10.3)
lymph#: 1.1 10*3/uL (ref 0.9–3.3)

## 2014-05-14 LAB — LIPASE, BLOOD: Lipase: 12 U/L (ref 11–59)

## 2014-05-14 LAB — URINE MICROSCOPIC-ADD ON

## 2014-05-14 MED ORDER — NYSTATIN 100000 UNIT/ML MT SUSP
5.0000 mL | Freq: Four times a day (QID) | OROMUCOSAL | Status: DC
  Administered 2014-05-14 – 2014-05-17 (×10): 500000 [IU] via ORAL
  Filled 2014-05-14 (×16): qty 5

## 2014-05-14 MED ORDER — PAROXETINE HCL 20 MG PO TABS
20.0000 mg | ORAL_TABLET | Freq: Every day | ORAL | Status: DC
  Administered 2014-05-15 – 2014-05-17 (×3): 20 mg via ORAL
  Filled 2014-05-14 (×4): qty 1

## 2014-05-14 MED ORDER — IOHEXOL 300 MG/ML  SOLN
100.0000 mL | Freq: Once | INTRAMUSCULAR | Status: AC | PRN
  Administered 2014-05-14: 100 mL via INTRAVENOUS

## 2014-05-14 MED ORDER — IOHEXOL 300 MG/ML  SOLN
50.0000 mL | Freq: Once | INTRAMUSCULAR | Status: AC | PRN
  Administered 2014-05-14: 50 mL via ORAL

## 2014-05-14 MED ORDER — ONDANSETRON HCL 4 MG PO TABS: 4.0000 mg | ORAL_TABLET | Freq: Four times a day (QID) | ORAL | Status: DC | PRN

## 2014-05-14 MED ORDER — GABAPENTIN 300 MG PO CAPS
300.0000 mg | ORAL_CAPSULE | Freq: Three times a day (TID) | ORAL | Status: DC
  Administered 2014-05-14 – 2014-05-17 (×6): 300 mg via ORAL
  Filled 2014-05-14 (×12): qty 1

## 2014-05-14 MED ORDER — METOPROLOL SUCCINATE ER 50 MG PO TB24
50.0000 mg | ORAL_TABLET | Freq: Every day | ORAL | Status: DC
  Administered 2014-05-15 – 2014-05-17 (×3): 50 mg via ORAL
  Filled 2014-05-14 (×4): qty 1

## 2014-05-14 MED ORDER — AMITRIPTYLINE HCL 25 MG PO TABS
25.0000 mg | ORAL_TABLET | Freq: Every evening | ORAL | Status: DC | PRN
  Filled 2014-05-14: qty 1

## 2014-05-14 MED ORDER — PANTOPRAZOLE SODIUM 40 MG PO TBEC
40.0000 mg | DELAYED_RELEASE_TABLET | Freq: Every day | ORAL | Status: DC
  Administered 2014-05-15 – 2014-05-17 (×3): 40 mg via ORAL
  Filled 2014-05-14 (×3): qty 1

## 2014-05-14 MED ORDER — ASPIRIN EC 81 MG PO TBEC
81.0000 mg | DELAYED_RELEASE_TABLET | Freq: Every day | ORAL | Status: DC
  Administered 2014-05-15 – 2014-05-17 (×3): 81 mg via ORAL
  Filled 2014-05-14 (×4): qty 1

## 2014-05-14 MED ORDER — MORPHINE SULFATE 2 MG/ML IJ SOLN
1.0000 mg | INTRAMUSCULAR | Status: DC | PRN
  Administered 2014-05-14 – 2014-05-15 (×3): 2 mg via INTRAVENOUS
  Filled 2014-05-14 (×3): qty 1

## 2014-05-14 MED ORDER — HYDROCODONE-ACETAMINOPHEN 5-325 MG PO TABS
1.0000 | ORAL_TABLET | Freq: Four times a day (QID) | ORAL | Status: DC | PRN
  Administered 2014-05-15: 2 via ORAL
  Filled 2014-05-14: qty 2

## 2014-05-14 MED ORDER — ACETAMINOPHEN 325 MG PO TABS: 650.0000 mg | ORAL_TABLET | Freq: Four times a day (QID) | ORAL | Status: DC | PRN

## 2014-05-14 MED ORDER — ONDANSETRON HCL 4 MG/2ML IJ SOLN: 4.0000 mg | Freq: Four times a day (QID) | INTRAMUSCULAR | Status: DC | PRN

## 2014-05-14 MED ORDER — PANTOPRAZOLE SODIUM 40 MG IV SOLR
40.0000 mg | Freq: Two times a day (BID) | INTRAVENOUS | Status: AC
  Administered 2014-05-14: 40 mg via INTRAVENOUS
  Filled 2014-05-14 (×2): qty 40

## 2014-05-14 MED ORDER — INFLUENZA VAC SPLIT QUAD 0.5 ML IM SUSY
0.5000 mL | PREFILLED_SYRINGE | INTRAMUSCULAR | Status: DC
  Filled 2014-05-14 (×3): qty 0.5

## 2014-05-14 MED ORDER — SODIUM CHLORIDE 0.9 % IV SOLN
INTRAVENOUS | Status: AC
  Administered 2014-05-15 (×2): via INTRAVENOUS

## 2014-05-14 MED ORDER — ACETAMINOPHEN 650 MG RE SUPP: 650.0000 mg | Freq: Four times a day (QID) | RECTAL | Status: DC | PRN

## 2014-05-14 MED ORDER — ALBUTEROL SULFATE (2.5 MG/3ML) 0.083% IN NEBU: 2.5000 mg | INHALATION_SOLUTION | RESPIRATORY_TRACT | Status: DC | PRN

## 2014-05-14 MED ORDER — FLECAINIDE ACETATE 50 MG PO TABS
50.0000 mg | ORAL_TABLET | Freq: Two times a day (BID) | ORAL | Status: DC
  Administered 2014-05-14 – 2014-05-17 (×6): 50 mg via ORAL
  Filled 2014-05-14 (×9): qty 1

## 2014-05-14 MED ORDER — ALUM & MAG HYDROXIDE-SIMETH 200-200-20 MG/5ML PO SUSP: 30.0000 mL | Freq: Four times a day (QID) | ORAL | Status: DC | PRN

## 2014-05-14 NOTE — Assessment & Plan Note (Signed)
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 Patient is in intractable pain diffusely throughout her body. This started the day after Neulasta was given. Over the past 6 days she has been suffering excruciating pain. On physical exam patient has a thrush as well as intense epigastric abdominal pain to palpation. She is tearful and was taking Vicodin which was not helping at home. For these reasons we would like to admit her to the hospital for symptom management. We will call the hospitalist to help with admission.

## 2014-05-14 NOTE — Plan of Care (Signed)
Problem: Phase I Progression Outcomes Goal: Pain controlled with appropriate interventions Outcome: Completed/Met Date Met:  05/14/14

## 2014-05-14 NOTE — Progress Notes (Signed)
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 No right axillary lymphadenopathy.    02/14/2014 Initial Diagnosis Breast cancer-right Er 100%, PR 94%, Ki 67: 17%; Her2 Neg ratio 1.74; T1bN0Mo (Clinical Stage 1A)   04/02/2014 Surgery Right breast lumpectomy: Invasive ductal carcinoma negative for lymph vascular invasion, 1.6 cm, grade 2, 3 SLN negative, reexcision margins benign, ER 100%, PR 94%, HER-2 negative Ki-67 70% T1 C. N0 M0 stage IA: Oncotype DX 29 (19%ROR)    CHIEF COMPLIANT: intractable epigastric abdominal pain, diffuse body pains  INTERVAL HISTORY: Tami [name] is a 63 year old lady with above-mentioned history of right breast cancer who was treated with lumpectomy and start adjuvant chemotherapy with Taxotere and Cytoxan on 05/07/2014. Over the past 6 days she had intractable body aches and pains that started the day after she received Neulasta injection. She describes these pains or intense and they are improved with Vicodin but that the pain relief does not last long. She came in today complaining of severe sore throat as well as severe epigastric pain. If she feels hungry the pain can be so intense and excruciating that it feels like it not in her tummy. She did not have any nausea vomiting issues that she has been fairly ill since chemotherapy over  the past 6 days.  REVIEW OF SYSTEMS:   Constitutional: Denies fevers, chills or abnormal weight loss Eyes: Denies blurriness of vision Ears, nose, mouth, throat, and face: Denies mucositis or sore throat Respiratory: Denies cough, dyspnea or wheezes Cardiovascular: Denies palpitation, chest discomfort or lower extremity swelling Gastrointestinal:  Severe abdominal pain Skin: Denies abnormal skin rashes Lymphatics: Denies new lymphadenopathy or easy bruising Neurological:Denies numbness, tingling or new weaknesses Behavioral/Psych: very emotional  Breast: denies any pain or lumps or nodules in either breasts Musculoskeletal: Diffuse pains in the shoulders and back All other systems were reviewed with the patient and are negative.  I have reviewed the past medical history, past surgical history, social history and family history with the patient and they are unchanged from previous note.  ALLERGIES:  is allergic to latex; sulfonamide derivatives; clarithromycin; crestor; statins; and ciprofloxacin hcl.  MEDICATIONS:  No current outpatient prescriptions on file.   No current facility-administered medications for this visit.    PHYSICAL EXAMINATION: ECOG PERFORMANCE STATUS: 2 - Symptomatic, <64% confined to bed  Filed Vitals:   05/14/14 1105  BP: 111/57  Pulse: 70  Temp: 98 F (36.7 C)  Resp: 20   Filed Weights   05/14/14 1105  Weight: 156 lb 8 oz (70.988 kg)    GENERAL:alert, no distress and comfortable SKIN: skin color, texture, turgor are normal, no rashes or significant lesions EYES: normal, Conjunctiva are pink and non-injected, sclera clear OROPHARYNX:no exudate, no erythema and lips, buccal mucosa, and tongue normal  NECK: supple, thyroid normal size, non-tender, without  nodularity LYMPH:  no palpable lymphadenopathy in the cervical, axillary or inguinal LUNGS: clear to auscultation and percussion with normal breathing effort HEART: regular rate & rhythm and no  murmurs and no lower extremity edema ABDOMEN:severe epigastric tenderness Musculoskeletal:no cyanosis of digits and no clubbing  NEURO: alert & oriented x 3 with fluent speech, no focal motor/sensory deficits   LABORATORY DATA:  I have reviewed the data as listed   Chemistry      Component Value Date/Time   NA 140 05/14/2014 1044   NA 144 03/28/2014 1145   K 3.6 05/14/2014 1044   K 4.1 03/28/2014 1145   CL 106 03/28/2014 1145   CO2 29 05/14/2014 1044   CO2 25 03/28/2014 1145   BUN 8.4 05/14/2014 1044   BUN 10 03/28/2014 1145   CREATININE 0.8 05/14/2014 1044   CREATININE 0.77 03/28/2014 1145   CREATININE 0.83 03/27/2014 1102      Component Value Date/Time   CALCIUM 9.2 05/14/2014 1044   CALCIUM 9.6 03/28/2014 1145   ALKPHOS 129 05/14/2014 1044   ALKPHOS 85 03/28/2014 1145   AST 72* 05/14/2014 1044   AST 21 03/28/2014 1145   ALT 138* 05/14/2014 1044   ALT 27 03/28/2014 1145   BILITOT 0.29 05/14/2014 1044   BILITOT 0.3 03/28/2014 1145       Lab Results  Component Value Date   WBC 6.9 05/14/2014   HGB 11.5* 05/14/2014   HCT 34.9 05/14/2014   MCV 93.1 05/14/2014   PLT 206 05/14/2014   NEUTROABS 5.2 05/14/2014    ASSESSMENT & PLAN:  Breast cancer of lower-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 Patient is in intractable pain diffusely throughout her body. This started the day after Neulasta was given. Over the past 6 days she has been suffering excruciating pain. On physical exam patient has a thrush as well as intense epigastric abdominal pain to palpation. She is tearful and was taking Vicodin which was not helping at  home. For these reasons we would like to admit her to the hospital for symptom management. We will call the hospitalist to help with admission.   No orders of the defined types were placed in this encounter.   The patient has a good understanding of the overall plan. she agrees with it. She will call with any problems that may develop before her next visit here.  I spent 25 minutes counseling the patient face to face. The total time spent in the appointment was 30 minutes and more than 50% was on counseling and review of test results    Rulon Eisenmenger, MD 05/14/2014 12:54 PM

## 2014-05-14 NOTE — H&P (Signed)
[facility] History and Physical  Tami [name] XIP:[id number] DOB: [birth year] DOA: 05/14/2014   PCP: [name], MD  Specialists: Dr. Lindi Adie is her oncologist. She is also followed by Dr. Claiborne Billings with cardiology for paroxysmal atrial fibrillation  Chief Complaint: Abdominal pain and generalized body aches  HPI: Tami [name] is a 63 y.o. female with the past medical history of paroxysmal atrial fibrillation on flecainide, but not on oral anticoagulation. She also has a history of peptic ulcer disease, irritable bowel syndrome and most recently was diagnosed with right breast cancer in July of this year. She underwent lumpectomy in September. And she still was started on chemotherapy last week. She was given Taxotere and Cytoxan on 05/07/2014. She tolerated her chemotherapy quite well, but after 2-3 days, started experiencing pain in her shoulders and neck, head and pretty much all over her body. She also started experiencing pain in the mouth. Also complained of upper abdominal pain which she describes as a gnawing pain without any radiation. She was initially constipated and then was able to have a bowel movement with small amounts of blood, but this was followed by onset of loose stools. She's had 2 loose stools today. Denies any watery diarrhea. No further blood in the stool. No black colored stools. The abdominal pain in the upper abdomen is about 6-7 out of 10 in intensity. As a result of her pain she has had poor oral intake and has not been able to sleep well. She denies any chest pain, shortness of breath or cough. No fever, no chills. No dizziness or lightheadedness. Denies any decrease in the amount of urine but has been using adultdiaper due to some degree of stress incontinence. She went to her oncologist office today and was referred for direct admission.  Home Medications: Prior to Admission medications   Medication Sig Start Date End Date Taking? Authorizing Provider    amitriptyline (ELAVIL) 25 MG tablet Take 25 mg by mouth at bedtime as needed for sleep. When patient has migraines    Historical Provider, MD  aspirin EC 81 MG tablet Take 81 mg by mouth daily.    Historical Provider, MD  celecoxib (CELEBREX) 200 MG capsule Take 200 mg by mouth 2 (two) times daily as needed for moderate pain.    Historical Provider, MD  dexamethasone (DECADRON) 4 MG tablet Take 2 tablets (8 mg total) by mouth 2 (two) times daily. Start the day before Taxotere. Then again the day after chemo for 3 days. 04/19/14   Rulon Eisenmenger, MD  flecainide (TAMBOCOR) 100 MG tablet Take 0.5 tablets (50 mg total) by mouth 2 (two) times daily. 04/12/14   Troy Sine, MD  gabapentin (NEURONTIN) 300 MG capsule Take 1 capsule (300 mg total) by mouth 3 (three) times daily. PATIENT NEEDS OFFICE VISIT FOR ADDITIONAL REFILLS    [name], MD  HYDROcodone-acetaminophen (NORCO) 5-325 MG per tablet Take 1-2 tablets by mouth every 6 (six) hours as needed. 04/26/14   Jackolyn Confer, MD  ibuprofen (ADVIL,MOTRIN) 200 MG tablet Take 200 mg by mouth every 6 (six) hours as needed for headache or moderate pain.    Historical Provider, MD  lidocaine-prilocaine (EMLA) cream Apply 1 application topically as needed. 04/26/14   Jackolyn Confer, MD  LORazepam (ATIVAN) 0.5 MG tablet Take 1 tablet (0.5 mg total) by mouth every 6 (six) hours as needed (Nausea or vomiting). 04/19/14   Rulon Eisenmenger, MD  metoprolol succinate (TOPROL-XL) 50 MG 24 hr tablet Take 1 tablet (50  mg total) by mouth daily. Take with or immediately following a meal. 04/12/14   Troy Sine, MD  ondansetron [facility]) 4 MG tablet  04/02/14   Historical Provider, MD  ondansetron (ZOFRAN) 8 MG tablet Take 1 tablet (8 mg total) by mouth 2 (two) times daily. Start the day after chemo for 3 days. Then take as needed for nausea or vomiting. 04/19/14   Rulon Eisenmenger, MD  PARoxetine (PAXIL) 20 MG tablet Take 1 tablet (20 mg total) by mouth daily.  04/24/14   [name], MD  prochlorperazine (COMPAZINE) 10 MG tablet Take 1 tablet (10 mg total) by mouth every 6 (six) hours as needed (Nausea or vomiting). 04/19/14   Rulon Eisenmenger, MD  valACYclovir (VALTREX) 500 MG tablet Take 1 tablet (500 mg total) by mouth 2 (two) times daily as needed. 07/27/12   [name], MD    Allergies:  Allergies  Allergen Reactions  . Latex Swelling and Rash    Skin swelling   . Sulfonamide Derivatives Anaphylaxis, Shortness Of Breath, Nausea Only and Other (See Comments)    Sweating, headache   . Clarithromycin Nausea Only and Other (See Comments)    Headache   . Crestor [Rosuvastatin] Other (See Comments)    Muscle aches, pain, weakness in lower extremities  . Statins Other (See Comments)    Muscle soreness and weakness w/ Rosuvastatin and Zetia.  . Ciprofloxacin Hcl Nausea Only, Palpitations and Other (See Comments)    Headache     Past Medical History: Past Medical History  Diagnosis Date  . Heart palpitations   . Osteoporosis   . Asthma   . SVT (supraventricular tachycardia)   . Numbness of arm     Right Arm  . Irregular heartbeat   . Anxiety   . Arthritis   . Atrial fibrillation   . Heartburn   . Hyperlipidemia   . Pure hypercholesterolemia   . Malignant neoplasm of breast (female), unspecified site   . Family history of malignant neoplasm of breast   . 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 Anemia     as a child  . Dysrhythmia     hx AF    Past Surgical History  Procedure Laterality Date  . Abdominal hysterectomy  1987  . Cystoscopy  04/25/2012    Pt may be a candidate for research anti-incontinence procedure protocol  . Doppler echocardiography  08/21/2010    Ejecion Fraction =>55% All chambers are normal in size and function No significant change from 2010.  . Dipyridamole myoview  05/27/2009    The post stress myocardial perfusion images show a normal pattern of perfusion in  all regions.The post stress ejection fraction is 83 %. Normal  myocardial perfusion imaging This is a low risk scan.  [name] Upper arterial dopplers  06/02/2011    Right Side PPG Demonstrates siginificantly diminshed waveforms with the Adson's maneuver which may suggest Thoracic Outlet Syndrone. This is a abnormal thoracic outlet arterial evaluation.  . Lower venous dopplers  04/11/2009    Normal bilateral lower ext venous duplex  . 30 day monitor  04/10/2009 to 05/23/2009    No End Of Summary Report Atrial Fib,Narrow Complex Tachycardia,Sinus Rhythm   . Breast surgery      Left  . Breast lumpectomy with needle localization and axillary sentinel lymph node bx Right 04/02/2014    Procedure: Right Axillary Lymphatic Mapping; Right axillary Sentinal Node Biopsy; Right Partial Mastectomy  after wire localization;  Surgeon: Jackolyn Confer, MD;  Location: [place];  Service: General;  Laterality: Right;  . Portacath placement N/A 04/26/2014    Procedure: ULTRASOUND GUIDED PORT A CATHE INSERTION;  Surgeon: Jackolyn Confer, MD;  Location: [facility];  Service: General;  Laterality: N/A;    Social History: she lives with her husband in [place]. No smoking, alcohol use or illicit drug use. She is independent with daily activities.  Family History:  Family History  Problem Relation Age of Onset  . Cancer Mother     skin cancer  . Diabetes Sister     Borderline  . Hypertension Sister   . Heart attack Sister   . Breast cancer Sister 74    maternal half-sister; deceased 43  . Diabetes Brother     Borderline; maternal half-brother  . Cancer Maternal Aunt     stomach and throat; heavy smoker and drinker; deceased 78  . Cancer Cousin 72    unk. primary; daughter of mat aunt with stomach/throat ca  . Cancer Other     breast/ovarian; daughter of mat cousin with unk. primary at 43; pt's 1st cousin-once-removed     Review of Systems - History obtained from the patient General ROS: positive for  -  fatigue Psychological ROS: negative Ophthalmic ROS: negative ENT ROS: as in hpi Allergy and Immunology ROS: negative Hematological and Lymphatic ROS: negative Endocrine ROS: negative Respiratory ROS: no cough, shortness of breath, or wheezing Cardiovascular ROS: no chest pain or dyspnea on exertion Gastrointestinal ROS: as in hpi Genito-Urinary ROS: as in hpi Musculoskeletal ROS: negative Neurological ROS: no TIA or stroke symptoms Dermatological ROS: negative  Physical Examination  Filed Vitals:   05/14/14 1254  BP: 123/57  Pulse: 65  Temp: 98.9 F (37.2 C)  TempSrc: Oral  Resp: 20  Height: 5\' 4"  (1.626 m)  Weight: 70.761 kg (156 lb)  SpO2: 100%    BP 123/57 mmHg  Pulse 65  Temp(Src) 98.9 F (37.2 C) (Oral)  Resp 20  Ht 5\' 4"  (1.626 m)  Wt 70.761 kg (156 lb)  BMI 26.76 kg/m2  SpO2 100%  LMP 07/06/1985  General appearance: alert, cooperative, appears stated age and no distress Head: Normocephalic, without obvious abnormality, atraumatic Eyes: conjunctivae/corneas clear. PERRL, EOM's intact.  Throat: White plaques noted in the back of the mouth. Neck: no adenopathy, no carotid bruit, no JVD, supple, symmetrical, trachea midline and thyroid not enlarged, symmetric, no tenderness/mass/nodules Resp: clear to auscultation bilaterally Cardio: regular rate and rhythm, S1, S2 normal, no murmur, click, rub or gallop GI: abdomen is soft. Tenderness and fullness appreciated in the epigastric area without any rebound rigidity or guarding. Liver edge is palpable in the right upper quadrant. Bowel sounds are present. No other masses appreciated. Extremities: extremities normal, atraumatic, no cyanosis or edema Pulses: 2+ and symmetric Skin: Skin color, texture, turgor normal. No rashes or lesions Neurologic: alert and oriented 3. Cranial nerves II-12 intact. Motor strength is 5 out of 5 bilateral upper and lower extremities.  Laboratory Data: Results for orders placed or  performed in visit on 05/14/14 (from the past 48 hour(s))  CBC with Differential     Status: Abnormal   Collection Time: 05/14/14 10:44 AM  Result Value Ref Range   WBC 6.9 3.9 - 10.3 10e3/uL   NEUT# 5.2 1.5 - 6.5 10e3/uL   HGB 11.5 (L) 11.6 - 15.9 g/dL   HCT 34.9 34.8 - 46.6 %   Platelets 206 145 - 400 10e3/uL  MCV 93.1 79.5 - 101.0 fL   MCH 30.8 25.1 - 34.0 pg   MCHC 33.1 31.5 - 36.0 g/dL   RBC 3.75 3.70 - 5.45 10e6/uL   RDW 13.1 11.2 - 14.5 %   lymph# 1.1 0.9 - 3.3 10e3/uL   MONO# 0.6 0.1 - 0.9 10e3/uL   Eosinophils Absolute 0.0 0.0 - 0.5 10e3/uL   Basophils Absolute 0.0 0.0 - 0.1 10e3/uL   NEUT% 75.5 38.4 - 76.8 %   LYMPH% 15.3 14.0 - 49.7 %   MONO% 8.6 0.0 - 14.0 %   EOS% 0.1 0.0 - 7.0 %   BASO% 0.5 0.0 - 2.0 %  Comprehensive metabolic panel (Cmet) - [facility]     Status: Abnormal   Collection Time: 05/14/14 10:44 AM  Result Value Ref Range   Sodium 140 136 - 145 mEq/L   Potassium 3.6 3.5 - 5.1 mEq/L   Chloride 105 98 - 109 mEq/L   CO2 29 22 - 29 mEq/L   Glucose 108 70 - 140 mg/dl   BUN 8.4 7.0 - 26.0 mg/dL   Creatinine 0.8 0.6 - 1.1 mg/dL   Total Bilirubin 0.29 0.20 - 1.20 mg/dL   Alkaline Phosphatase 129 40 - 150 U/L   AST 72 (H) 5 - 34 U/L   ALT 138 (H) 0 - 55 U/L   Total Protein 6.2 (L) 6.4 - 8.3 g/dL   Albumin 2.9 (L) 3.5 - 5.0 g/dL   Calcium 9.2 8.4 - 10.4 mg/dL   Anion Gap 5 3 - 11 mEq/L    Radiology Reports: No results found.  Electrocardiogram: pending  Problem List  Active Problems:   Abdominal pain, epigastric   Breast cancer of lower-inner quadrant of right female breast   Oral candidiasis   Dehydration   Generalized pain   PAF (paroxysmal atrial fibrillation)   PUD (peptic ulcer disease)   Transaminitis   Normocytic anemia   Epigastric abdominal pain   Assessment: this is a 63 year old African-American female who presents to the oncologists office with worsening generalized body ache, epigastric abdominal pain, dehydration. Her body aches  are most likely secondary to Neulasta. Reason for her abdominal pain is not entirely clear, but could be due to peptic ulcer disease, possibly due to steroids. She also was noted to have transaminitis which could be secondary to her chemotherapeutic agents but definite etiology is unclear. Patient also has oral candidiasis. She appears to be dehydrated.  Plan: #1. Epigastric abdominal pain/transaminitis:  Considering transaminitis and her history of breast cancer we will proceed with imaging study of her abdomen including CT scan. Lipase will level will be checked. She'll be given PPI. She does give a history of peptic ulcer disease. She has been on steroid, which could've contributed. Check hepatitis panel.  #2 Generalized body pain: This is most likely due to Neulasta. Treat with morphine for severe occurrences. Continue oral analgesic agents.  #3 Oral candidiasis: She'll be given nystatin orally. She denies any history suggestive of dysphagia. Denies any difficulty or painful swallowing. She does admit to pain in her mouth. She has had endoscopy in the past and required esophageal dilatation in 2008. If her symptoms do not improve, further evaluation may be necessary. Hesitate to use Diflucan considering elevated LFTs.  #4 Dehydration: She'll be given IV fluids.  #5 History of right-sided breast cancer status post lumpectomy: She was given first course of chemotherapy with Taxotere and Cytoxan on November 3. Dr. Lindi Adie aware of admission.  #6 Normocytic anemia: Most  likely due to chronic disease. Monitor hemoglobin.  #7 Loose stool/Constipation: This goes along with her history of irritable bowel syndrome. She did mention small amount of blood in her stool when she had to strain earlier this week. But denies any further bleeding episodes. Denies any black stool. Continue to monitor for now. Follow-up on results of CT scan.  #8 History of paroxysmal atrial fibrillation: On examination, she appears  to have a regular rhythm. We'll check EKG. She is on flecainide which will be continued. There is no need to utilize telemetry for now. She is not on anticoagulation and just on aspirin.   DVT Prophylaxis: SCDs Code Status: . Full code Family Communication: discussed with the patient and her husband  Disposition Plan: admit to MedSurg   Further management decisions will depend on results of further testing and patient's response to treatment.   [facility]  [facility] Pager [phone]  If 7PM-7AM, please contact night-coverage [email or web address] Password [email or web address]  05/14/2014, 1:01 PM

## 2014-05-15 DIAGNOSIS — R740 Nonspecific elevation of levels of transaminase and lactic acid dehydrogenase [LDH]: Secondary | ICD-10-CM

## 2014-05-15 LAB — COMPREHENSIVE METABOLIC PANEL
ALK PHOS: 125 U/L — AB (ref 39–117)
ALT: 89 U/L — AB (ref 0–35)
AST: 37 U/L (ref 0–37)
Albumin: 2.5 g/dL — ABNORMAL LOW (ref 3.5–5.2)
Anion gap: 12 (ref 5–15)
BUN: 4 mg/dL — ABNORMAL LOW (ref 6–23)
CALCIUM: 8.6 mg/dL (ref 8.4–10.5)
CO2: 26 mEq/L (ref 19–32)
Chloride: 104 mEq/L (ref 96–112)
Creatinine, Ser: 0.8 mg/dL (ref 0.50–1.10)
GFR calc Af Amer: 88 mL/min — ABNORMAL LOW (ref >90)
GFR calc non Af Amer: 76 mL/min — ABNORMAL LOW (ref >90)
Glucose, Bld: 102 mg/dL — ABNORMAL HIGH (ref 70–99)
POTASSIUM: 3.8 meq/L (ref 3.7–5.3)
SODIUM: 142 meq/L (ref 137–147)
TOTAL PROTEIN: 6.1 g/dL (ref 6.0–8.3)
Total Bilirubin: <0.2 mg/dL — ABNORMAL LOW (ref 0.3–1.2)

## 2014-05-15 LAB — HEPATITIS PANEL, ACUTE
HCV AB: NEGATIVE
HEP A IGM: NONREACTIVE
Hep B C IgM: NONREACTIVE
Hepatitis B Surface Ag: NEGATIVE

## 2014-05-15 LAB — CBC
HEMATOCRIT: 30.1 % — AB (ref 36.0–46.0)
Hemoglobin: 10.3 g/dL — ABNORMAL LOW (ref 12.0–15.0)
MCH: 31.5 pg (ref 26.0–34.0)
MCHC: 34.2 g/dL (ref 30.0–36.0)
MCV: 92 fL (ref 78.0–100.0)
Platelets: 208 10*3/uL (ref 150–400)
RBC: 3.27 MIL/uL — AB (ref 3.87–5.11)
RDW: 13.1 % (ref 11.5–15.5)
WBC: 22.9 10*3/uL — ABNORMAL HIGH (ref 4.0–10.5)

## 2014-05-15 LAB — TSH: TSH: 2.74 u[IU]/mL (ref 0.350–4.500)

## 2014-05-15 LAB — CK: CK TOTAL: 19 U/L (ref 7–177)

## 2014-05-15 MED ORDER — MORPHINE SULFATE 4 MG/ML IJ SOLN
4.0000 mg | INTRAMUSCULAR | Status: DC | PRN
  Administered 2014-05-16: 4 mg via INTRAVENOUS
  Filled 2014-05-15: qty 1

## 2014-05-15 MED ORDER — LIDOCAINE VISCOUS 2 % MT SOLN
15.0000 mL | OROMUCOSAL | Status: DC | PRN
  Administered 2014-05-15: 15 mL via OROMUCOSAL
  Filled 2014-05-15 (×2): qty 15

## 2014-05-15 MED ORDER — ENSURE COMPLETE PO LIQD
237.0000 mL | ORAL | Status: DC
  Administered 2014-05-16: 237 mL via ORAL

## 2014-05-15 MED ORDER — FLUCONAZOLE IN SODIUM CHLORIDE 200-0.9 MG/100ML-% IV SOLN
200.0000 mg | Freq: Once | INTRAVENOUS | Status: AC
  Administered 2014-05-15: 200 mg via INTRAVENOUS
  Filled 2014-05-15: qty 100

## 2014-05-15 MED ORDER — OXYCODONE-ACETAMINOPHEN 5-325 MG PO TABS: 1.0000 | ORAL_TABLET | ORAL | Status: DC | PRN

## 2014-05-15 MED ORDER — FLUCONAZOLE 100MG IVPB
100.0000 mg | INTRAVENOUS | Status: DC
  Administered 2014-05-16: 100 mg via INTRAVENOUS
  Filled 2014-05-15 (×2): qty 50

## 2014-05-15 MED ORDER — VITAMINS A & D EX OINT
TOPICAL_OINTMENT | CUTANEOUS | Status: AC
  Administered 2014-05-15: 19:00:00
  Filled 2014-05-15: qty 5

## 2014-05-15 MED ORDER — MORPHINE SULFATE (CONCENTRATE) 10 MG/0.5ML PO SOLN: 10.0000 mg | ORAL | Status: DC | PRN

## 2014-05-15 NOTE — Progress Notes (Signed)
[facility] PROGRESS NOTE  Assessment/Plan: Abdominal pain, epigastric/ Transaminitis - CT scan of the abdomen and pelvis negative for any metastatic lesions or acute findings. - hepatitis panel was nonreactive. - LFTs are improving bilirubin is within normal limits - check a CK, continue Protonix   Leukocytosis: - unclear etiology she has remained afebrile, she is on Decadron. - She was given Neulasta.  Oral candidiasis - Start Diflucan give lidocaine gel.  Generalized pain - increase narcotics check a CK.  PAF (paroxysmal atrial fibrillation) - sinus rhythm  Breast cancer of lower-inner quadrant of right female breast - follow-up with oncology as an outpatient.    Code Status: full Family Communication: hhusband  Disposition Plan: inpatient   Consultants:  none  Procedures:  CT scan of the abdomen and pelvis showed no acute findings done on 05/14/2014  Antibiotics:  none  HPI/Subjective: Epigastric tenderness diffuse pain  Objective: Filed Vitals:   05/14/14 1254 05/14/14 1527 05/14/14 2151 05/15/14 0456  BP: 123/57 141/62 127/58 111/57  Pulse: 65 65 74 73  Temp: 98.9 F (37.2 C) 98.5 F (36.9 C) 99.9 F (37.7 C) 98.8 F (37.1 C)  TempSrc: Oral Oral Oral Oral  Resp: 20 20 20 20   Height: 5\' 4"  (1.626 m)     Weight: 70.761 kg (156 lb)     SpO2: 100% 100% 98% 98%    Intake/Output Summary (Last 24 hours) at 05/15/14 1215 Last data filed at 05/15/14 9458  Gross per 24 hour  Intake    580 ml  Output   1100 ml  Net   -520 ml   Filed Weights   05/14/14 1254  Weight: 70.761 kg (156 lb)    Exam:  General: Alert, awake, oriented x3, in no acute distress.  HEENT: No bruits, no goiter.  Heart: Regular rate and rhythm, without murmurs, rubs, gallops.  Lungs: Good air movement, bilateral air movement.  Abdomen: Soft, Epigastric tenderness, nondistended, positive bowel sounds.  Neuro: Grossly intact, nonfocal.   Data Reviewed: Basic  Metabolic Panel:  Recent Labs Lab 05/14/14 1044 05/15/14 0500  NA 140 142  K 3.6 3.8  CL  --  104  CO2 29 26  GLUCOSE 108 102*  BUN 8.4 4*  CREATININE 0.8 0.80  CALCIUM 9.2 8.6   Liver Function Tests:  Recent Labs Lab 05/14/14 1044 05/15/14 0500  AST 72* 37  ALT 138* 89*  ALKPHOS 129 125*  BILITOT 0.29 <0.2*  PROT 6.2* 6.1  ALBUMIN 2.9* 2.5*    Recent Labs Lab 05/14/14 1345  LIPASE 12   No results for input(s): AMMONIA in the last 168 hours. CBC:  Recent Labs Lab 05/14/14 1044 05/15/14 0500  WBC 6.9 22.9*  NEUTROABS 5.2  --   HGB 11.5* 10.3*  HCT 34.9 30.1*  MCV 93.1 92.0  PLT 206 208   Cardiac Enzymes: No results for input(s): CKTOTAL, CKMB, CKMBINDEX, TROPONINI in the last 168 hours. BNP (last 3 results) No results for input(s): PROBNP in the last 8760 hours. CBG: No results for input(s): GLUCAP in the last 168 hours.  No results found for this or any previous visit (from the past 240 hour(s)).   Studies: Ct Abdomen Pelvis W Contrast  05/14/2014   CLINICAL DATA:  Generalized abdominal pain and nausea.  EXAM: CT ABDOMEN AND PELVIS WITH CONTRAST  TECHNIQUE: Multidetector CT imaging of the abdomen and pelvis was performed using the standard protocol following bolus administration of intravenous contrast.  CONTRAST:  139mL OMNIPAQUE IOHEXOL 300  MG/ML SOLN, 76mL OMNIPAQUE IOHEXOL 300 MG/ML SOLN  COMPARISON:  None.  FINDINGS: Lower chest: Mild pleural thickening is identified bilaterally. No pleural or pericardial effusion identified. The lung bases are clear.  Hepatobiliary: Cyst within the right hepatic lobe measures 1.2 cm. The gallbladder appears normal. No biliary dilatation.  Pancreas: Unremarkable appearance of the pancreas.  Spleen: The spleen is normal.  Adrenals/Urinary Tract: Normal appearance of both adrenal glands. The left kidney appears normal. There is right hydronephrosis to the level of the right UPJ this is similar to 03/22/2012. The ureter  has a normal caliber. The urinary bladder is normal.  Stomach/Bowel: Normal appearance of the stomach. The small bowel loops have a normal course and caliber. There is no evidence for bowel obstruction. The terminal ileum appears normal. The appendix is visualized and is unremarkable. Unremarkable appearance of the colon.  Vascular/Lymphatic: Normal caliber of the abdominal aorta. There is no aneurysm. No retroperitoneal or pelvic adenopathy. The portal vein appears patent.  Reproductive: Previous hysterectomy.  No adnexal mass noted.  Other: No free fluid or fluid collections within the abdomen or pelvis.  Musculoskeletal: The visualized bony structures are negative for aggressive lytic or sclerotic bone lesion. Mild curvature of the lumbar spine is convex towards the left.  IMPRESSION: 1. No acute findings within the abdomen or pelvis. 2. 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 Liver cysts.   Electronically Signed   By: Kerby Moors M.D.   On: 05/14/2014 16:22    Scheduled Meds: . aspirin EC  81 mg Oral Daily  . flecainide  50 mg Oral BID  . gabapentin  300 mg Oral TID  . Influenza vac split quadrivalent PF  0.5 mL Intramuscular Tomorrow-1000  . metoprolol succinate  50 mg Oral Daily  . nystatin  5 mL Oral QID  . pantoprazole  40 mg Oral Daily  . PARoxetine  20 mg Oral Daily   Continuous Infusions: . sodium chloride 100 mL/hr at 05/15/14 1003     FELIZ Marguarite Arbour  [facility] Pager [phone]. If 8PM-8AM, please contact night-coverage at [email or web address], password [email or web address] 05/15/2014, 12:15 PM  LOS: 1 day

## 2014-05-15 NOTE — Plan of Care (Signed)
Problem: Phase I Progression Outcomes Goal: OOB as tolerated unless otherwise ordered Outcome: Completed/Met Date Met:  05/15/14     

## 2014-05-15 NOTE — Progress Notes (Signed)
INITIAL NUTRITION ASSESSMENT  DOCUMENTATION CODES Per approved criteria  -Not Applicable   INTERVENTION: -Recommend Ensure Complete once daily for additional nutrients until appetite returns to baseline/taste changes improve -Reviewed nutrition therapy for taste changes; continue with nystatin to assist with oral candidiasis -Provided pt with nutrition supplement coupons -RD to continue to monitor  NUTRITION DIAGNOSIS: Inadequate oral intake related to chemo related taste changes as evidenced by decreased PO intake .   Goal: Pt to meet >/= 90% of their estimated nutrition needs    Monitor:  Total protein/energy intake, labs, weights, supplement acceptance  Reason for Assessment: MST  63 y.o. female  Admitting Dx: <principal problem not specified>  ASSESSMENT: Tami [name] is a 63 y.o. female with the past medical history of paroxysmal atrial fibrillation on flecainide, but not on oral anticoagulation. She also has a history of peptic ulcer disease, irritable bowel syndrome and most recently was diagnosed with right breast cancer in July of this year. She underwent lumpectomy in September.   -Pt reported decreased appetite since beginning chemotherapy d/t taste changes. Noted foods to taste excessively sweet or to have no taste at all -Diet recall indicates pt tries to eat several small meals day, and is able to prepare healthy meals for herself and husband -Denied any changes in weight, has remained stable around 150 lbs. Noted that her current weight is higher d/t being weighed in multiple layers of clothes -Appetite remains fair, encouraged intake of nutrition supplement once daily to continue to meet estimated nutrient needs while receiving treatments. -Discussed various methods to improve taste and alleviate taste changes. Provided pt with nutrition education handout and supplement coupons  Height: Ht Readings from Last 1 Encounters:  05/14/14 5\' 4"  (1.626 m)     Weight: Wt Readings from Last 1 Encounters:  05/14/14 156 lb (70.761 kg)    Ideal Body Weight: 120 lb  % Ideal Body Weight: 130%  Wt Readings from Last 10 Encounters:  05/14/14 156 lb (70.761 kg)  05/14/14 156 lb 8 oz (70.988 kg)  05/07/14 151 lb 12.8 oz (68.856 kg)  04/26/14 152 lb 8 oz (69.174 kg)  04/19/14 152 lb 8 oz (69.174 kg)  04/02/14 151 lb (68.493 kg)  03/28/14 151 lb (68.493 kg)  03/27/14 151 lb 14.4 oz (68.901 kg)  02/26/14 155 lb 12.8 oz (70.67 kg)  02/14/14 152 lb 6.4 oz (69.128 kg)    Usual Body Weight: 150 lb  % Usual Body Weight: 100%  BMI:  Body mass index is 26.76 kg/(m^2).  Estimated Nutritional Needs: Kcal: 1700-1900 Protein: 70-85 gram Fluid: >/= 1700 ml daily  Skin: WDL  Diet Order: DIET SOFT  EDUCATION NEEDS: -Education needs addressed   Intake/Output Summary (Last 24 hours) at 05/15/14 1551 Last data filed at 05/15/14 0952  Gross per 24 hour  Intake    580 ml  Output   1100 ml  Net   -520 ml    Last BM: 11/10   Labs:   Recent Labs Lab 05/14/14 1044 05/15/14 0500  NA 140 142  K 3.6 3.8  CL  --  104  CO2 29 26  BUN 8.4 4*  CREATININE 0.8 0.80  CALCIUM 9.2 8.6  GLUCOSE 108 102*    CBG (last 3)  No results for input(s): GLUCAP in the last 72 hours.  Scheduled Meds: . aspirin EC  81 mg Oral Daily  . flecainide  50 mg Oral BID  . [START ON 05/16/2014] fluconazole (DIFLUCAN) IV  100 mg Intravenous  Q24H  . gabapentin  300 mg Oral TID  . Influenza vac split quadrivalent PF  0.5 mL Intramuscular Tomorrow-1000  . metoprolol succinate  50 mg Oral Daily  . nystatin  5 mL Oral QID  . pantoprazole  40 mg Oral Daily  . PARoxetine  20 mg Oral Daily  . vitamin A & D        Continuous Infusions:   Past Medical History  Diagnosis Date  . Heart palpitations   . Osteoporosis   . Asthma   . SVT (supraventricular tachycardia)   . Numbness of arm     Right Arm  . Irregular heartbeat   . Anxiety   . Arthritis   .  Atrial fibrillation   . Heartburn   . Hyperlipidemia   . Pure hypercholesterolemia   . Malignant neoplasm of breast (female), unspecified site   . Family history of malignant neoplasm of breast   . 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 Anemia     as a child  . Dysrhythmia     hx AF    Past Surgical History  Procedure Laterality Date  . Abdominal hysterectomy  1987  . Cystoscopy  04/25/2012    Pt may be a candidate for research anti-incontinence procedure protocol  . Doppler echocardiography  08/21/2010    Ejecion Fraction =>55% All chambers are normal in size and function No significant change from 2010.  . Dipyridamole myoview  05/27/2009    The post stress myocardial perfusion images show a normal pattern of perfusion in all regions.The post stress ejection fraction is 83 %. Normal  myocardial perfusion imaging This is a low risk scan.  [name] Upper arterial dopplers  06/02/2011    Right Side PPG Demonstrates siginificantly diminshed waveforms with the Adson's maneuver which may suggest Thoracic Outlet Syndrone. This is a abnormal thoracic outlet arterial evaluation.  . Lower venous dopplers  04/11/2009    Normal bilateral lower ext venous duplex  . 30 day monitor  04/10/2009 to 05/23/2009    No End Of Summary Report Atrial Fib,Narrow Complex Tachycardia,Sinus Rhythm   . Breast surgery      Left  . Breast lumpectomy with needle localization and axillary sentinel lymph node bx Right 04/02/2014    Procedure: Right Axillary Lymphatic Mapping; Right axillary Sentinal Node Biopsy; Right Partial Mastectomy after wire localization;  Surgeon: Jackolyn Confer, MD;  Location: [facility];  Service: General;  Laterality: Right;  . Portacath placement N/A 04/26/2014    Procedure: ULTRASOUND GUIDED PORT A CATHE INSERTION;  Surgeon: Jackolyn Confer, MD;  Location: [facility];  Service: General;  Laterality: N/A;    Atlee Abide MS RD LDN Clinical  Dietitian [phone]

## 2014-05-15 NOTE — Care Management Note (Signed)
CARE MANAGEMENT NOTE 05/15/2014  Patient:  LAQUESHIA, CIHLAR   Account Number:  [id number]  Date Initiated:  05/15/2014  Documentation initiated by:  Marney Doctor  Subjective/Objective Assessment:   63 yo admitted with abdominal pain. Hx of peptic ulcer disease, irritable bowel syndrome and most recently was diagnosed with right breast cancer in July of this year.     Action/Plan:   Pt is from home with spouse.   Anticipated DC Date:  05/17/2014   Anticipated DC Plan:  [place]  CM consult      Choice offered to / List presented to:             Status of service:  In process, will continue to follow Medicare Important Message given?   (If response is "NO", the following Medicare IM given date fields will be blank) Date Medicare IM given:   Medicare IM given by:   Date Additional Medicare IM given:   Additional Medicare IM given by:    Discharge Disposition:    Per UR Regulation:  Reviewed for med. necessity/level of care/duration of stay  If discussed at [place] of Stay Meetings, dates discussed:    Comments:  05/15/14 Marney Doctor RN,BSN,NCM [phone] Chart reviewed and CM following for DC needs.

## 2014-05-16 DIAGNOSIS — R7989 Other specified abnormal findings of blood chemistry: Secondary | ICD-10-CM

## 2014-05-16 DIAGNOSIS — R945 Abnormal results of liver function studies: Secondary | ICD-10-CM | POA: Insufficient documentation

## 2014-05-16 DIAGNOSIS — R29898 Other symptoms and signs involving the musculoskeletal system: Secondary | ICD-10-CM

## 2014-05-16 DIAGNOSIS — R109 Unspecified abdominal pain: Secondary | ICD-10-CM | POA: Insufficient documentation

## 2014-05-16 DIAGNOSIS — R1084 Generalized abdominal pain: Secondary | ICD-10-CM

## 2014-05-16 LAB — CBC
HCT: 31.4 % — ABNORMAL LOW (ref 36.0–46.0)
Hemoglobin: 10.5 g/dL — ABNORMAL LOW (ref 12.0–15.0)
MCH: 31.4 pg (ref 26.0–34.0)
MCHC: 33.4 g/dL (ref 30.0–36.0)
MCV: 94 fL (ref 78.0–100.0)
PLATELETS: 201 10*3/uL (ref 150–400)
RBC: 3.34 MIL/uL — AB (ref 3.87–5.11)
RDW: 13.2 % (ref 11.5–15.5)
WBC: 35.7 10*3/uL — ABNORMAL HIGH (ref 4.0–10.5)

## 2014-05-16 MED ORDER — HYDROCODONE-ACETAMINOPHEN 5-325 MG PO TABS
1.0000 | ORAL_TABLET | Freq: Four times a day (QID) | ORAL | Status: DC | PRN
  Administered 2014-05-17: 2 via ORAL
  Filled 2014-05-16: qty 2

## 2014-05-16 NOTE — Progress Notes (Signed)
SUBJECTIVE: improvement in pain in the throat, shoulders and abdomen Tami [name] is a 63 year old African American lady with history of stage IA right breast cancer underwent lumpectomy but was found to have a high intermediate risk Oncotype DX. She was given for cycle of systemic chemotherapy with Taxotere and Cytoxan on 05/07/2014. She presented to the clinic on 05/14/2014 complaining of severe intractable pain in the throat shoulders throughout the body and intense epigastric abdominal pain that was made worse when she was hungry. On examination at that time, she had thrush and tender epigastrium. She was admitted to the hospital for pain relief. In the hospital she had a CT of abdomen and pelvis which was normal.her blood work revealed elevation of ALT but normal bilirubin. Her white count was also elevated in the hospital. She is feeling better on current pain medications.  OBJECTIVE PHYSICAL EXAMINATION: ECOG PERFORMANCE STATUS: 3 - Symptomatic, >50% confined to bed  Filed Vitals:   05/16/14 0543  BP: 110/57  Pulse: 62  Temp: 98.2 F (36.8 C)  Resp: 20   Filed Weights   05/14/14 1254  Weight: 156 lb (70.761 kg)    GENERAL:alert, but fatigued SKIN: skin color, texture, turgor are normal, no rashes or significant lesions EYES: normal, Conjunctiva are pink and non-injected, sclera clear OROPHARYNX: thrush has resolved  NECK: supple, thyroid normal size, non-tender, without nodularity LYMPH:  no palpable lymphadenopathy in the cervical, axillary or inguinal LUNGS: clear to auscultation and percussion with normal breathing effort HEART: regular rate & rhythm and no murmurs and no lower extremity edema ABDOMEN:tender abdomen Musculoskeletal:no cyanosis of digits and no clubbing  NEURO: alert & oriented x 3 with fluent speech, no focal motor/sensory deficits  LABORATORY DATA:  I have reviewed the data as listed @LASTCHEMISTRY @  Lab Results  Component Value Date   WBC 22.9*  05/15/2014   HGB 10.3* 05/15/2014   HCT 30.1* 05/15/2014   MCV 92.0 05/15/2014   PLT 208 05/15/2014   NEUTROABS 5.2 05/14/2014    ASSESSMENT AND PLAN: 1. Diffuse musculoskeletal pains: Suspicious to be related to Neulasta. For the next chemotherapy cycle we will withhold Neulasta and monitor her. 2.  Oral thrush:most likely related to Decadron. For the next chemotherapy cycle I instructed her to withhold oral Decadron and we will adjust the subsequent doses of IV Decadron and outpatient Decadron regimen to decrease the risk of the above complication. 3. Epigastric abdominal pain: I suspect this is related to either gastritis or ulcer of duodenum. Most likely also related to dexamethasone. Patient will need to stay on oral PPI therapy. 4. I will see her as an outpatient following discharge with her next cycle of chemotherapy. Thank you very much for your excellent care. Please do not hesitate to call with any further questions

## 2014-05-16 NOTE — Progress Notes (Signed)
[facility] PROGRESS NOTE  Assessment/Plan: Abdominal pain, epigastric/ Transaminitis: - CT scan of the abdomen and pelvis negative for any metastatic lesions or acute findings. - hepatitis panel was nonreactive. - Check C-met in am. - Ck normal, continue Protonix   Leukocytosis: - Unclear etiology she has remained afebrile, she is on Decadron. - Repeat CBC. - She was given Neulasta.  Oral candidiasis - Change diflucan to oral  Generalized pain - Resolved.  PAF (paroxysmal atrial fibrillation) - Sinus rhythm  Breast cancer of lower-inner quadrant of right female breast - follow-up with oncology as an outpatient.    Code Status: full Family Communication: hhusband  Disposition Plan: inpatient   Consultants:  none  Procedures:  CT scan of the abdomen and pelvis showed no acute findings done on 05/14/2014  Antibiotics:  none  HPI/Subjective: Now resolved.  Objective: Filed Vitals:   05/15/14 0456 05/15/14 1300 05/15/14 2052 05/16/14 0543  BP: 111/57 119/56 117/58 110/57  Pulse: 73 66 66 62  Temp: 98.8 F (37.1 C) 98.5 F (36.9 C) 98 F (36.7 C) 98.2 F (36.8 C)  TempSrc: Oral Oral Oral Oral  Resp: 20  18 20   Height:      Weight:      SpO2: 98% 99% 98% 99%    Intake/Output Summary (Last 24 hours) at 05/16/14 1328 Last data filed at 05/16/14 0938  Gross per 24 hour  Intake    690 ml  Output      0 ml  Net    690 ml   Filed Weights   05/14/14 1254  Weight: 70.761 kg (156 lb)    Exam:  General: Alert, awake, oriented x3, in no acute distress.  HEENT: No bruits, no goiter.  Heart: Regular rate and rhythm. Lungs: Good air movement, Clear  Abdomen: Soft, none tenderness, nondistended, positive bowel sounds.  Neuro: Grossly intact, nonfocal.   Data Reviewed: Basic Metabolic Panel:  Recent Labs Lab 05/14/14 1044 05/15/14 0500  NA 140 142  K 3.6 3.8  CL  --  104  CO2 29 26  GLUCOSE 108 102*  BUN 8.4 4*  CREATININE 0.8  0.80  CALCIUM 9.2 8.6   Liver Function Tests:  Recent Labs Lab 05/14/14 1044 05/15/14 0500  AST 72* 37  ALT 138* 89*  ALKPHOS 129 125*  BILITOT 0.29 <0.2*  PROT 6.2* 6.1  ALBUMIN 2.9* 2.5*    Recent Labs Lab 05/14/14 1345  LIPASE 12   No results for input(s): AMMONIA in the last 168 hours. CBC:  Recent Labs Lab 05/14/14 1044 05/15/14 0500  WBC 6.9 22.9*  NEUTROABS 5.2  --   HGB 11.5* 10.3*  HCT 34.9 30.1*  MCV 93.1 92.0  PLT 206 208   Cardiac Enzymes:  Recent Labs Lab 05/15/14 1240  CKTOTAL 19   BNP (last 3 results) No results for input(s): PROBNP in the last 8760 hours. CBG: No results for input(s): GLUCAP in the last 168 hours.  No results found for this or any previous visit (from the past 240 hour(s)).   Studies: Ct Abdomen Pelvis W Contrast  05/14/2014   CLINICAL DATA:  Generalized abdominal pain and nausea.  EXAM: CT ABDOMEN AND PELVIS WITH CONTRAST  TECHNIQUE: Multidetector CT imaging of the abdomen and pelvis was performed using the standard protocol following bolus administration of intravenous contrast.  CONTRAST:  146m OMNIPAQUE IOHEXOL 300 MG/ML SOLN, 547mOMNIPAQUE IOHEXOL 300 MG/ML SOLN  COMPARISON:  None.  FINDINGS: Lower chest: Mild pleural thickening is  identified bilaterally. No pleural or pericardial effusion identified. The lung bases are clear.  Hepatobiliary: Cyst within the right hepatic lobe measures 1.2 cm. The gallbladder appears normal. No biliary dilatation.  Pancreas: Unremarkable appearance of the pancreas.  Spleen: The spleen is normal.  Adrenals/Urinary Tract: Normal appearance of both adrenal glands. The left kidney appears normal. There is right hydronephrosis to the level of the right UPJ this is similar to 03/22/2012. The ureter has a normal caliber. The urinary bladder is normal.  Stomach/Bowel: Normal appearance of the stomach. The small bowel loops have a normal course and caliber. There is no evidence for bowel obstruction.  The terminal ileum appears normal. The appendix is visualized and is unremarkable. Unremarkable appearance of the colon.  Vascular/Lymphatic: Normal caliber of the abdominal aorta. There is no aneurysm. No retroperitoneal or pelvic adenopathy. The portal vein appears patent.  Reproductive: Previous hysterectomy.  No adnexal mass noted.  Other: No free fluid or fluid collections within the abdomen or pelvis.  Musculoskeletal: The visualized bony structures are negative for aggressive lytic or sclerotic bone lesion. Mild curvature of the lumbar spine is convex towards the left.  IMPRESSION: 1. No acute findings within the abdomen or pelvis. 2. 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 Liver cysts.   Electronically Signed   By: Kerby Moors M.D.   On: 05/14/2014 16:22    Scheduled Meds: . aspirin EC  81 mg Oral Daily  . feeding supplement (ENSURE COMPLETE)  237 mL Oral Q24H  . flecainide  50 mg Oral BID  . fluconazole (DIFLUCAN) IV  100 mg Intravenous Q24H  . gabapentin  300 mg Oral TID  . Influenza vac split quadrivalent PF  0.5 mL Intramuscular Tomorrow-1000  . metoprolol succinate  50 mg Oral Daily  . nystatin  5 mL Oral QID  . pantoprazole  40 mg Oral Daily  . PARoxetine  20 mg Oral Daily   Continuous Infusions:     Charlynne Cousins  [facility] Pager [phone]. If 8PM-8AM, please contact night-coverage at [email or web address], password [facility] 05/16/2014, 1:28 PM  LOS: 2 days

## 2014-05-17 ENCOUNTER — Telehealth: Payer: Self-pay | Admitting: Hematology and Oncology

## 2014-05-17 MED ORDER — FLUCONAZOLE 100 MG PO TABS: 100.0000 mg | ORAL_TABLET | Freq: Every day | ORAL | Status: DC

## 2014-05-17 MED ORDER — HEPARIN SOD (PORK) LOCK FLUSH 100 UNIT/ML IV SOLN
500.0000 [IU] | INTRAVENOUS | Status: AC | PRN
  Administered 2014-05-17: 500 [IU]
  Filled 2014-05-17: qty 5

## 2014-05-17 MED ORDER — PANTOPRAZOLE SODIUM 40 MG PO TBEC: 40.0000 mg | DELAYED_RELEASE_TABLET | Freq: Every day | ORAL | Status: DC

## 2014-05-17 MED ORDER — HYDROCODONE-ACETAMINOPHEN 5-325 MG PO TABS: 1.0000 | ORAL_TABLET | Freq: Four times a day (QID) | ORAL | Status: DC | PRN

## 2014-05-17 MED ORDER — INFLUENZA VAC SPLIT QUAD 0.5 ML IM SUSY
0.5000 mL | PREFILLED_SYRINGE | Freq: Once | INTRAMUSCULAR | Status: AC
  Administered 2014-05-17: 0.5 mL via INTRAMUSCULAR
  Filled 2014-05-17: qty 0.5

## 2014-05-17 MED ORDER — INFLUENZA VAC SPLIT QUAD 0.5 ML IM SUSY: 0.5000 mL | PREFILLED_SYRINGE | INTRAMUSCULAR | Status: DC

## 2014-05-17 NOTE — Discharge Summary (Signed)
Physician Discharge Summary  Tami [name] VOH:[id number] DOB: December 30, 1950 DOA: 05/14/2014  PCP: [name], MD  Admit date: 05/14/2014 Discharge date: 05/17/2014  Time spent: 11minutes  Recommendations for Outpatient Follow-up:  1. Follow up with oncology in 2 weeks.  Discharge Diagnoses:  Active Problems:   Abdominal pain, epigastric   Transaminitis   Breast cancer of lower-inner quadrant of right female breast   Oral candidiasis   Dehydration   Generalized pain   PAF (paroxysmal atrial fibrillation)   PUD (peptic ulcer disease)   Normocytic anemia   Epigastric abdominal pain   Abdominal pain   Abnormal LFTs   Discharge Condition: stable  Diet recommendation: regular  Filed Weights   05/14/14 1254  Weight: 70.761 kg (156 lb)    History of present illness:  63 y.o. female with the past medical history of paroxysmal atrial fibrillation on flecainide, but not on oral anticoagulation. She also has a history of peptic ulcer disease, irritable bowel syndrome and most recently was diagnosed with right breast cancer in July of this year. She underwent lumpectomy in September. And she still was started on chemotherapy last week. She was given Taxotere and Cytoxan on 05/07/2014. She tolerated her chemotherapy quite well, but after 2-3 days, started experiencing pain in her shoulders and neck, head and pretty much all over her body. She also started experiencing pain in the mouth. Also complained of upper abdominal pain which she describes as a gnawing pain without any radiation. She was initially constipated and then was able to have a bowel movement with small amounts of blood, but this was followed by onset of loose stools. She's had 2 loose stools today. Denies any watery diarrhea.  Hospital Course:  Abdominal pain/transaminitis: - CT scan of the abdomen and pelvis was negative for any metastatic disease. - Hepatitis panel was non-reactive. CK was within normal she was  started on Protonix and oral thrush treatment with results as below. - Elevation transaminatis probably due to esophageal and oral thrush.  Leukocytosis: - Most likely multifactorial due to oral thrush and ongoing dexamethasone treatment. - She also just had Neulasta 2 weeks prior to admission.  Oral and Esophageal candidiasis - she was started on nystatin and IV Diflucan with great improvement in 24 hours. - Change Diflucan to oral continue for 7 day course.  Generalized pain: - Resolved, was sent home on a short course of narcotics.  Breast cancer of the lower inner quadrant right breast: - Follow-up with oncology in 2 weeks.  Procedures:  CT abdomen and pelvis with result as below  Consultations:  none  Discharge Exam: Filed Vitals:   05/17/14 0514  BP: 127/66  Pulse: 62  Temp: 98 F (36.7 C)  Resp: 16    General: A&O x3 Cardiovascular: RRR Respiratory: good air movement CTA B/L  Discharge Instructions You were cared for by a hospitalist during your hospital stay. If you have any questions about your discharge medications or the care you received while you were in the hospital after you are discharged, you can call the unit and asked to speak with the hospitalist on call if the hospitalist that took care of you is not available. Once you are discharged, your primary care physician will handle any further medical issues. Please note that NO REFILLS for any discharge medications will be authorized once you are discharged, as it is imperative that you return to your primary care physician (or establish a relationship with a primary care physician if you do not have  one) for your aftercare needs so that they can reassess your need for medications and monitor your lab values.  Discharge Instructions    Diet - low sodium heart healthy    Complete by:  As directed      Diet - low sodium heart healthy    Complete by:  As directed      Increase activity slowly    Complete by:   As directed      Increase activity slowly    Complete by:  As directed           Current Discharge Medication List    START taking these medications   Details  fluconazole (DIFLUCAN) 100 MG tablet Take 1 tablet (100 mg total) by mouth daily. Qty: 7 tablet, Refills: 0    !! HYDROcodone-acetaminophen (NORCO) 5-325 MG per tablet Take 1 tablet by mouth every 6 (six) hours as needed for moderate pain. Qty: 30 tablet, Refills: 0    pantoprazole (PROTONIX) 40 MG tablet Take 1 tablet (40 mg total) by mouth daily. Qty: 30 tablet, Refills: 0     !! - Potential duplicate medications found. Please discuss with provider.    CONTINUE these medications which have NOT CHANGED   Details  amitriptyline (ELAVIL) 25 MG tablet Take 25 mg by mouth at bedtime as needed for sleep. When patient has migraines    aspirin EC 81 MG tablet Take 81 mg by mouth daily.    celecoxib (CELEBREX) 200 MG capsule Take 200 mg by mouth 2 (two) times daily as needed for moderate pain.    dexamethasone (DECADRON) 4 MG tablet Take 2 tablets (8 mg total) by mouth 2 (two) times daily. Start the day before Taxotere. Then again the day after chemo for 3 days. Qty: 30 tablet, Refills: 1   Associated Diagnoses: Breast cancer of lower-inner quadrant of right female breast    flecainide (TAMBOCOR) 100 MG tablet Take 0.5 tablets (50 mg total) by mouth 2 (two) times daily. Qty: 90 tablet, Refills: 3    gabapentin (NEURONTIN) 300 MG capsule Take 1 capsule (300 mg total) by mouth 3 (three) times daily. PATIENT NEEDS OFFICE VISIT FOR ADDITIONAL REFILLS Qty: 90 capsule, Refills: 0    !! HYDROcodone-acetaminophen (NORCO) 5-325 MG per tablet Take 1-2 tablets by mouth every 6 (six) hours as needed. Qty: 20 tablet, Refills: 0    ibuprofen (ADVIL,MOTRIN) 200 MG tablet Take 200 mg by mouth every 6 (six) hours as needed for headache or moderate pain.    lidocaine-prilocaine (EMLA) cream Apply 1 application topically as needed. Qty:  30 g, Refills: 0    LORazepam (ATIVAN) 0.5 MG tablet Take 1 tablet (0.5 mg total) by mouth every 6 (six) hours as needed (Nausea or vomiting). Qty: 30 tablet, Refills: 0   Associated Diagnoses: Breast cancer of lower-inner quadrant of right female breast    metoprolol succinate (TOPROL-XL) 50 MG 24 hr tablet Take 1 tablet (50 mg total) by mouth daily. Take with or immediately following a meal. Qty: 90 tablet, Refills: 3    !! ondansetron (ZOFRAN) 4 MG tablet Refills: 0    !! ondansetron (ZOFRAN) 8 MG tablet Take 1 tablet (8 mg total) by mouth 2 (two) times daily. Start the day after chemo for 3 days. Then take as needed for nausea or vomiting. Qty: 30 tablet, Refills: 1   Associated Diagnoses: Breast cancer of lower-inner quadrant of right female breast    PARoxetine (PAXIL) 20 MG tablet Take 1 tablet (20  mg total) by mouth daily. Qty: 90 tablet, Refills: 2    prochlorperazine (COMPAZINE) 10 MG tablet Take 1 tablet (10 mg total) by mouth every 6 (six) hours as needed (Nausea or vomiting). Qty: 30 tablet, Refills: 1   Associated Diagnoses: Breast cancer of lower-inner quadrant of right female breast    valACYclovir (VALTREX) 500 MG tablet Take 1 tablet (500 mg total) by mouth 2 (two) times daily as needed. Qty: 60 tablet, Refills: 11   Associated Diagnoses: HSV (herpes simplex virus) anogenital infection    Meth-Hyo-M Bl-Na Phos-Ph Sal (URIBEL) 118 MG CAPS Take 118 mg by mouth 4 (four) times daily as needed (for bladder).  Refills: 2     !! - Potential duplicate medications found. Please discuss with provider.    STOP taking these medications     PRESCRIPTION MEDICATION        Allergies  Allergen Reactions  . Latex Swelling and Rash    Skin swelling   . Sulfonamide Derivatives Anaphylaxis, Shortness Of Breath, Nausea Only and Other (See Comments)    Sweating, headache   . Clarithromycin Nausea Only and Other (See Comments)    Headache   . Crestor [Rosuvastatin] Other (See  Comments)    Muscle aches, pain, weakness in lower extremities  . Statins Other (See Comments)    Muscle soreness and weakness w/ Rosuvastatin and Zetia.  . Ciprofloxacin Hcl Nausea Only, Palpitations and Other (See Comments)    Headache    Follow-up Information    Follow up with Rulon Eisenmenger, MD In 2 weeks.   Specialty:  Hematology and Oncology   Why:  hospital follow up   Contact information:   [place] [id number] [phone]        The results of significant diagnostics from this hospitalization (including imaging, microbiology, ancillary and laboratory) are listed below for reference.    Significant Diagnostic Studies: Ct Abdomen Pelvis W Contrast  05/14/2014   CLINICAL DATA:  Generalized abdominal pain and nausea.  EXAM: CT ABDOMEN AND PELVIS WITH CONTRAST  TECHNIQUE: Multidetector CT imaging of the abdomen and pelvis was performed using the standard protocol following bolus administration of intravenous contrast.  CONTRAST:  145mL OMNIPAQUE IOHEXOL 300 MG/ML SOLN, 20mL OMNIPAQUE IOHEXOL 300 MG/ML SOLN  COMPARISON:  None.  FINDINGS: Lower chest: Mild pleural thickening is identified bilaterally. No pleural or pericardial effusion identified. The lung bases are clear.  Hepatobiliary: Cyst within the right hepatic lobe measures 1.2 cm. The gallbladder appears normal. No biliary dilatation.  Pancreas: Unremarkable appearance of the pancreas.  Spleen: The spleen is normal.  Adrenals/Urinary Tract: Normal appearance of both adrenal glands. The left kidney appears normal. There is right hydronephrosis to the level of the right UPJ this is similar to 03/22/2012. The ureter has a normal caliber. The urinary bladder is normal.  Stomach/Bowel: Normal appearance of the stomach. The small bowel loops have a normal course and caliber. There is no evidence for bowel obstruction. The terminal ileum appears normal. The appendix is visualized and is unremarkable. Unremarkable  appearance of the colon.  Vascular/Lymphatic: Normal caliber of the abdominal aorta. There is no aneurysm. No retroperitoneal or pelvic adenopathy. The portal vein appears patent.  Reproductive: Previous hysterectomy.  No adnexal mass noted.  Other: No free fluid or fluid collections within the abdomen or pelvis.  Musculoskeletal: The visualized bony structures are negative for aggressive lytic or sclerotic bone lesion. Mild curvature of the lumbar spine is convex towards the left.  IMPRESSION: 1. No acute findings within the abdomen or pelvis. 2. 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 Liver cysts.   Electronically Signed   By: Kerby Moors M.D.   On: 05/14/2014 16:22   Dg Chest Port 1 View  04/26/2014   CLINICAL DATA:  RIGHT-sided Port-A-Cath placement.  EXAM: PORTABLE CHEST - 1 VIEW; DG C-ARM 1-60 MIN - NRPT [facility]  COMPARISON:  None.  FINDINGS: Cardiopericardial silhouette within normal limits. Mild pulmonary vascular congestion is present. Mediastinal contours are normal.  There is a new RIGHT IJ Port-A-Cath with the tip at the level of the carina in the upper to mid SVC. No pneumothorax. Bilateral pleural apical scarring appears similar to prior. Surgical clips project over the RIGHT axilla.  Mild soft tissue emphysema is present in the RIGHT supraclavicular area.  IMPRESSION: RIGHT IJ power port placement.  No pneumothorax.   Electronically Signed   By: Dereck Ligas M.D.   On: 04/26/2014 14:59   Dg C-arm 1-60 Min-no Report  04/26/2014   CLINICAL DATA:  RIGHT-sided Port-A-Cath placement.  EXAM: PORTABLE CHEST - 1 VIEW; DG C-ARM 1-60 MIN - [facility]PT [facility]  COMPARISON:  None.  FINDINGS: Cardiopericardial silhouette within normal limits. Mild pulmonary vascular congestion is present. Mediastinal contours are normal.  There is a new RIGHT IJ Port-A-Cath with the tip at the level of the carina in the upper to mid SVC. No pneumothorax. Bilateral pleural apical scarring appears  similar to prior. Surgical clips project over the RIGHT axilla.  Mild soft tissue emphysema is present in the RIGHT supraclavicular area.  IMPRESSION: RIGHT IJ power port placement.  No pneumothorax.   Electronically Signed   By: Dereck Ligas M.D.   On: 04/26/2014 14:59    Microbiology: No results found for this or any previous visit (from the past 240 hour(s)).   Labs: Basic Metabolic Panel:  Recent Labs Lab 05/14/14 1044 05/15/14 0500  NA 140 142  K 3.6 3.8  CL  --  104  CO2 29 26  GLUCOSE 108 102*  BUN 8.4 4*  CREATININE 0.8 0.80  CALCIUM 9.2 8.6   Liver Function Tests:  Recent Labs Lab 05/14/14 1044 05/15/14 0500  AST 72* 37  ALT 138* 89*  ALKPHOS 129 125*  BILITOT 0.29 <0.2*  PROT 6.2* 6.1  ALBUMIN 2.9* 2.5*    Recent Labs Lab 05/14/14 1345  LIPASE 12   No results for input(s): AMMONIA in the last 168 hours. CBC:  Recent Labs Lab 05/14/14 1044 05/15/14 0500 05/16/14 1401  WBC 6.9 22.9* 35.7*  NEUTROABS 5.2  --   --   HGB 11.5* 10.3* 10.5*  HCT 34.9 30.1* 31.4*  MCV 93.1 92.0 94.0  PLT 206 208 201   Cardiac Enzymes:  Recent Labs Lab 05/15/14 1240  CKTOTAL 19   BNP: BNP (last 3 results) No results for input(s): PROBNP in the last 8760 hours. CBG: No results for input(s): GLUCAP in the last 168 hours.     Signed:  Charlynne Cousins  [facility] 05/17/2014, 11:11 AM

## 2014-05-17 NOTE — Telephone Encounter (Signed)
, °

## 2014-05-22 ENCOUNTER — Telehealth: Payer: Self-pay | Admitting: *Deleted

## 2014-05-22 NOTE — Telephone Encounter (Signed)
Patient called requesting an appt with Dr. Lindi Adie before her next treatment. Advised patient that she already has an appt with him on the same day as treatment. Patient verbalized understanding.

## 2014-05-27 ENCOUNTER — Other Ambulatory Visit: Payer: Self-pay

## 2014-05-27 DIAGNOSIS — C50311 Malignant neoplasm of lower-inner quadrant of right female breast: Secondary | ICD-10-CM

## 2014-05-28 ENCOUNTER — Telehealth: Payer: Self-pay | Admitting: *Deleted

## 2014-05-28 ENCOUNTER — Other Ambulatory Visit (HOSPITAL_BASED_OUTPATIENT_CLINIC_OR_DEPARTMENT_OTHER): Payer: BC Managed Care – PPO

## 2014-05-28 ENCOUNTER — Ambulatory Visit (HOSPITAL_BASED_OUTPATIENT_CLINIC_OR_DEPARTMENT_OTHER): Payer: BC Managed Care – PPO | Admitting: Hematology and Oncology

## 2014-05-28 ENCOUNTER — Ambulatory Visit (HOSPITAL_BASED_OUTPATIENT_CLINIC_OR_DEPARTMENT_OTHER): Payer: BC Managed Care – PPO

## 2014-05-28 ENCOUNTER — Telehealth: Payer: Self-pay | Admitting: Hematology and Oncology

## 2014-05-28 VITALS — BP 148/71 | HR 74 | Temp 98.2°F | Resp 20 | Ht 64.0 in | Wt 153.8 lb

## 2014-05-28 DIAGNOSIS — M899 Disorder of bone, unspecified: Secondary | ICD-10-CM

## 2014-05-28 DIAGNOSIS — C50311 Malignant neoplasm of lower-inner quadrant of right female breast: Secondary | ICD-10-CM

## 2014-05-28 DIAGNOSIS — Z5111 Encounter for antineoplastic chemotherapy: Secondary | ICD-10-CM

## 2014-05-28 DIAGNOSIS — D6481 Anemia due to antineoplastic chemotherapy: Secondary | ICD-10-CM

## 2014-05-28 DIAGNOSIS — R7989 Other specified abnormal findings of blood chemistry: Secondary | ICD-10-CM

## 2014-05-28 LAB — COMPREHENSIVE METABOLIC PANEL (CC13)
ALBUMIN: 3.7 g/dL (ref 3.5–5.0)
ALT: 37 U/L (ref 0–55)
AST: 20 U/L (ref 5–34)
Alkaline Phosphatase: 132 U/L (ref 40–150)
Anion Gap: 9 mEq/L (ref 3–11)
BUN: 7.2 mg/dL (ref 7.0–26.0)
CALCIUM: 9.7 mg/dL (ref 8.4–10.4)
CHLORIDE: 107 meq/L (ref 98–109)
CO2: 23 mEq/L (ref 22–29)
Creatinine: 0.8 mg/dL (ref 0.6–1.1)
Glucose: 143 mg/dl — ABNORMAL HIGH (ref 70–140)
Potassium: 4 mEq/L (ref 3.5–5.1)
SODIUM: 139 meq/L (ref 136–145)
TOTAL PROTEIN: 7.2 g/dL (ref 6.4–8.3)
Total Bilirubin: 0.44 mg/dL (ref 0.20–1.20)

## 2014-05-28 LAB — CBC WITH DIFFERENTIAL/PLATELET
BASO%: 0.5 % (ref 0.0–2.0)
Basophils Absolute: 0 10*3/uL (ref 0.0–0.1)
EOS ABS: 0 10*3/uL (ref 0.0–0.5)
EOS%: 0 % (ref 0.0–7.0)
HCT: 32.6 % — ABNORMAL LOW (ref 34.8–46.6)
HGB: 11 g/dL — ABNORMAL LOW (ref 11.6–15.9)
LYMPH%: 5.5 % — ABNORMAL LOW (ref 14.0–49.7)
MCH: 31.5 pg (ref 25.1–34.0)
MCHC: 33.7 g/dL (ref 31.5–36.0)
MCV: 93.5 fL (ref 79.5–101.0)
MONO#: 0.1 10*3/uL (ref 0.1–0.9)
MONO%: 0.6 % (ref 0.0–14.0)
NEUT#: 8.7 10*3/uL — ABNORMAL HIGH (ref 1.5–6.5)
NEUT%: 93.4 % — ABNORMAL HIGH (ref 38.4–76.8)
Platelets: 474 10*3/uL — ABNORMAL HIGH (ref 145–400)
RBC: 3.48 10*6/uL — AB (ref 3.70–5.45)
RDW: 13.6 % (ref 11.2–14.5)
WBC: 9.3 10*3/uL (ref 3.9–10.3)
lymph#: 0.5 10*3/uL — ABNORMAL LOW (ref 0.9–3.3)

## 2014-05-28 MED ORDER — SODIUM CHLORIDE 0.9 % IV SOLN
500.0000 mg/m2 | Freq: Once | INTRAVENOUS | Status: AC
  Administered 2014-05-28: 880 mg via INTRAVENOUS
  Filled 2014-05-28: qty 44

## 2014-05-28 MED ORDER — ONDANSETRON 16 MG/50ML IVPB (CHCC)
16.0000 mg | Freq: Once | INTRAVENOUS | Status: AC
  Administered 2014-05-28: 16 mg via INTRAVENOUS

## 2014-05-28 MED ORDER — SODIUM CHLORIDE 0.9 % IV SOLN
Freq: Once | INTRAVENOUS | Status: AC
  Administered 2014-05-28: 10:00:00 via INTRAVENOUS

## 2014-05-28 MED ORDER — SODIUM CHLORIDE 0.9 % IJ SOLN
10.0000 mL | INTRAMUSCULAR | Status: DC | PRN
Start: 2014-05-28 — End: 2014-05-28
  Administered 2014-05-28: 10 mL
  Filled 2014-05-28: qty 10

## 2014-05-28 MED ORDER — HEPARIN SOD (PORK) LOCK FLUSH 100 UNIT/ML IV SOLN
500.0000 [IU] | Freq: Once | INTRAVENOUS | Status: AC | PRN
  Administered 2014-05-28: 500 [IU]
  Filled 2014-05-28: qty 5

## 2014-05-28 MED ORDER — DEXAMETHASONE SODIUM PHOSPHATE 10 MG/ML IJ SOLN
INTRAMUSCULAR | Status: AC
  Filled 2014-05-28: qty 1

## 2014-05-28 MED ORDER — UNABLE TO FIND: 1.0000 | Status: DC | PRN

## 2014-05-28 MED ORDER — ONDANSETRON 16 MG/50ML IVPB (CHCC)
INTRAVENOUS | Status: AC
  Filled 2014-05-28: qty 16

## 2014-05-28 MED ORDER — DOCETAXEL CHEMO INJECTION 160 MG/16ML
65.0000 mg/m2 | Freq: Once | INTRAVENOUS | Status: AC
  Administered 2014-05-28: 120 mg via INTRAVENOUS
  Filled 2014-05-28: qty 12

## 2014-05-28 MED ORDER — DEXAMETHASONE SODIUM PHOSPHATE 10 MG/ML IJ SOLN
10.0000 mg | Freq: Once | INTRAMUSCULAR | Status: AC
  Administered 2014-05-28: 10 mg via INTRAVENOUS

## 2014-05-28 NOTE — Telephone Encounter (Signed)
Per staff message and POF I have scheduled appts. Advised scheduler of appts. JMW  

## 2014-05-28 NOTE — Patient Instructions (Signed)
[facility] Discharge Instructions for Patients Receiving Chemotherapy  Today you received the following chemotherapy agents Docetaxel/Cytoxan.  To help prevent nausea and vomiting after your treatment, we encourage you to take your nausea medication as directed.   If you develop nausea and vomiting that is not controlled by your nausea medication, call the clinic.   BELOW ARE SYMPTOMS THAT SHOULD BE REPORTED IMMEDIATELY:  *FEVER GREATER THAN 100.5 F  *CHILLS WITH OR WITHOUT FEVER  NAUSEA AND VOMITING THAT IS NOT CONTROLLED WITH YOUR NAUSEA MEDICATION  *UNUSUAL SHORTNESS OF BREATH  *UNUSUAL BRUISING OR BLEEDING  TENDERNESS IN MOUTH AND THROAT WITH OR WITHOUT PRESENCE OF ULCERS  *URINARY PROBLEMS  *BOWEL PROBLEMS  UNUSUAL RASH Items with * indicate a potential emergency and should be followed up as soon as possible.  Feel free to call the clinic you have any questions or concerns. The clinic phone number is [phone].    

## 2014-05-28 NOTE — Telephone Encounter (Signed)
, °

## 2014-05-28 NOTE — Assessment & Plan Note (Addendum)
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 status post lumpectomy, Oncotype DX recurrence score 29 intermediate risk 19% risk of recurrence with hormonal therapy alone Patient received cycle 1 of chemotherapy with Taxotere and Cytoxan on11/09/2013 a week later she was hospitalized for diffuse body pains related to Neulasta as well as epigastric pain related to gastritis due to steroids.  Chemotoxicities: Patient experience of falling toxicities of chemotherapy 1. Epigastric abdominal pain due to gastritis related to steroids 2. Thrush related to steroids 3. Bone pain related to Neulasta 4. Chemotherapy-induced elevation of AST and ALT 5. Chemotherapy-induced anemia  Treatment plan: I have dose reduced Taxotere and Cytoxan due to the about side effects of chemotherapy. We will also decrease the dosage of IV Decadron with chemotherapy. I was instructed her to reduce the dosage of oral dexamethasone in order to prevent gastritis thrush. We will also discontinue Neulasta because of excruciating bone pain it caused. Patient understands that this might lead to increased risk of infection if she remains neutropenia prolonged period of time. We will have to watch her very closely on chemotherapy.  Return to clinic in one week for nadir count check and toxicity evaluation.

## 2014-05-28 NOTE — Progress Notes (Signed)
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 No right axillary lymphadenopathy.    02/14/2014 Initial Diagnosis Breast cancer-right Er 100%, PR 94%, Ki 67: 17%; Her2 Neg ratio 1.74; T1bN0Mo (Clinical Stage 1A)   04/02/2014 Surgery Right breast lumpectomy: Invasive ductal carcinoma negative for lymph vascular invasion, 1.6 cm, grade 2, 3 SLN negative, reexcision margins benign, ER 100%, PR 94%, HER-2 negative Ki-67 70% T1 C. N0 M0 stage IA: Oncotype DX 29 (19%ROR)   05/07/2014 -  Chemotherapy Taxotere Cytoxan adjuvant chemotherapy every 3 weeks x4 cycles (after cycle one hospitalization for pain related to Neulasta and severe gastritis due to steroids)    CHIEF COMPLIANT: cycle 2 day 1 Taxotere Cytoxan  INTERVAL HISTORY: Tami [name] is a 63 year old American lady with above-mentioned history of right breast cancer treated with lumpectomy and is currently on adjuvant chemotherapy. After cycle one, she was admitted to hospital with intractable body pains as well as gastritis and thrush. She was treated with oral antibiotics and proton pump inhibitors and pain medications and her symptoms improved. She is now here to start cycle 2 of chemotherapy. Patient had alopecia and increased scalp sensitivity.  REVIEW OF SYSTEMS:   Constitutional: Denies fevers, chills or  abnormal weight loss; alopecia Eyes: Denies blurriness of vision Ears, nose, mouth, throat, and face: Denies mucositis or sore throat Respiratory: Denies cough, dyspnea or wheezes Cardiovascular: Denies palpitation, chest discomfort or lower extremity swelling Gastrointestinal:  Denies nausea, heartburn or change in bowel habits Skin: Denies abnormal skin rashes Lymphatics: Denies new lymphadenopathy or easy bruising Neurological:Denies numbness, tingling or new weaknesses Behavioral/Psych: Mood is stable, no new changes  All other systems were reviewed with the patient and are negative.  I have reviewed the past medical history, past surgical history, social history and family history with the patient and they are unchanged from previous note.  ALLERGIES:  is allergic to latex; sulfonamide derivatives; clarithromycin; crestor; statins; and ciprofloxacin hcl.  MEDICATIONS:  Current Outpatient Prescriptions  Medication Sig Dispense Refill  . amitriptyline (ELAVIL) 25 MG tablet Take 25 mg by mouth at bedtime as needed for sleep. When patient has migraines    . aspirin EC 81 MG tablet Take 81 mg by mouth daily.    . celecoxib (CELEBREX) 200 MG capsule Take 200 mg by mouth 2 (two) times daily as needed for moderate pain.    [name] dexamethasone (DECADRON) 4 MG tablet Take 2 tablets (8 mg total) by mouth 2 (two) times daily. Start the day before Taxotere. Then again the day after chemo for 3 days. 30 tablet 1  . flecainide (TAMBOCOR) 100 MG tablet Take 0.5 tablets (50 mg total) by mouth 2 (two) times daily. 90 tablet 3  . fluconazole (DIFLUCAN) 100 MG tablet Take 1 tablet (100 mg total) by mouth daily. 7 tablet 0  .  gabapentin (NEURONTIN) 300 MG capsule Take 1 capsule (300 mg total) by mouth 3 (three) times daily. PATIENT NEEDS OFFICE VISIT FOR ADDITIONAL REFILLS 90 capsule 0  . HYDROcodone-acetaminophen (NORCO) 5-325 MG per tablet Take 1-2 tablets by mouth every 6 (six) hours as needed. (Patient  taking differently: Take 1-2 tablets by mouth every 6 (six) hours as needed for moderate pain. ) 20 tablet 0  . HYDROcodone-acetaminophen (NORCO) 5-325 MG per tablet Take 1 tablet by mouth every 6 (six) hours as needed for moderate pain. 30 tablet 0  . ibuprofen (ADVIL,MOTRIN) 200 MG tablet Take 200 mg by mouth every 6 (six) hours as needed for headache or moderate pain.    . lidocaine-prilocaine (EMLA) cream Apply 1 application topically as needed. 30 g 0  . LORazepam (ATIVAN) 0.5 MG tablet Take 1 tablet (0.5 mg total) by mouth every 6 (six) hours as needed (Nausea or vomiting). 30 tablet 0  . Meth-Hyo-M Bl-Na Phos-Ph Sal (URIBEL) 118 MG CAPS Take 118 mg by mouth 4 (four) times daily as needed (for bladder).   2  . metoprolol succinate (TOPROL-XL) 50 MG 24 hr tablet Take 1 tablet (50 mg total) by mouth daily. Take with or immediately following a meal. 90 tablet 3  . ondansetron (ZOFRAN) 4 MG tablet   0  . ondansetron (ZOFRAN) 8 MG tablet Take 1 tablet (8 mg total) by mouth 2 (two) times daily. Start the day after chemo for 3 days. Then take as needed for nausea or vomiting. 30 tablet 1  . pantoprazole (PROTONIX) 40 MG tablet Take 1 tablet (40 mg total) by mouth daily. 30 tablet 0  . PARoxetine (PAXIL) 20 MG tablet Take 1 tablet (20 mg total) by mouth daily. 90 tablet 2  . prochlorperazine (COMPAZINE) 10 MG tablet Take 1 tablet (10 mg total) by mouth every 6 (six) hours as needed (Nausea or vomiting). 30 tablet 1  . valACYclovir (VALTREX) 500 MG tablet Take 1 tablet (500 mg total) by mouth 2 (two) times daily as needed. 60 tablet 11   No current facility-administered medications for this visit.    PHYSICAL EXAMINATION: ECOG PERFORMANCE STATUS: 1 - Symptomatic but completely ambulatory  Filed Vitals:   05/28/14 0901  BP: 148/71  Pulse: 74  Temp: 98.2 F (36.8 C)  Resp: 20   Filed Weights   05/28/14 0901  Weight: 153 lb 12.8 oz (69.763 kg)    GENERAL:alert, no distress and  comfortable SKIN: skin color, texture, turgor are normal, no rashes or significant lesions EYES: normal, Conjunctiva are pink and non-injected, sclera clear OROPHARYNX:no exudate, no erythema and lips, buccal mucosa, and tongue normal  NECK: supple, thyroid normal size, non-tender, without nodularity LYMPH:  no palpable lymphadenopathy in the cervical, axillary or inguinal LUNGS: clear to auscultation and percussion with normal breathing effort HEART: regular rate & rhythm and no murmurs and no lower extremity edema ABDOMEN:abdomen soft, non-tender and normal bowel sounds Musculoskeletal:no cyanosis of digits and no clubbing  NEURO: alert & oriented x 3 with fluent speech, no focal motor/sensory deficits  LABORATORY DATA:  I have reviewed the data as listed   Chemistry      Component Value Date/Time   NA 142 05/15/2014 0500   NA 140 05/14/2014 1044   K 3.8 05/15/2014 0500   K 3.6 05/14/2014 1044   CL 104 05/15/2014 0500   CO2 26 05/15/2014 0500   CO2 29 05/14/2014 1044   BUN 4* 05/15/2014 0500   BUN 8.4 05/14/2014 1044     CREATININE 0.80 05/15/2014 0500   CREATININE 0.8 05/14/2014 1044   CREATININE 0.83 03/27/2014 1102      Component Value Date/Time   CALCIUM 8.6 05/15/2014 0500   CALCIUM 9.2 05/14/2014 1044   ALKPHOS 125* 05/15/2014 0500   ALKPHOS 129 05/14/2014 1044   AST 37 05/15/2014 0500   AST 72* 05/14/2014 1044   ALT 89* 05/15/2014 0500   ALT 138* 05/14/2014 1044   BILITOT <0.2* 05/15/2014 0500   BILITOT 0.29 05/14/2014 1044       Lab Results  Component Value Date   WBC 9.3 05/28/2014   HGB 11.0* 05/28/2014   HCT 32.6* 05/28/2014   MCV 93.5 05/28/2014   PLT 474* 05/28/2014   NEUTROABS 8.7* 05/28/2014   ASSESSMENT & PLAN:  Breast cancer of lower-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 status post lumpectomy, Oncotype DX recurrence score 29 intermediate  risk 19% risk of recurrence with hormonal therapy alone Patient received cycle 1 of chemotherapy with Taxotere and Cytoxan on11/09/2013 a week later she was hospitalized for diffuse body pains related to Neulasta as well as epigastric pain related to gastritis due to steroids.  Chemotoxicities: Patient experience of falling toxicities of chemotherapy 1. Epigastric abdominal pain due to gastritis related to steroids 2. Thrush related to steroids: Resolved with Diflucan 3. Bone pain related to Neulasta:. Will stop Neulasta 4. Chemotherapy-induced elevation of AST and ALT 5. Chemotherapy-induced anemia: Being observed 6. Chemotherapy induced alopecia with scalp sensitivity  Treatment plan: I have dose reduced Taxotere and Cytoxan due to the about side effects of chemotherapy. We will also decrease the dosage of IV Decadron with chemotherapy. I was instructed her to reduce the dosage of oral dexamethasone in order to prevent gastritis thrush. We will also discontinue Neulasta because of excruciating bone pain it caused. Patient understands that this might lead to increased risk of infection if she remains neutropenia prolonged period of time. We will have to watch her very closely on chemotherapy.  Return to clinic in one week for nadir count check and toxicity evaluation.   Orders Placed This Encounter  Procedures  . CBC with Differential    Standing Status: Future     Number of Occurrences:      Standing Expiration Date: 05/28/2015  . Comprehensive metabolic panel (Cmet) - [facility]    Standing Status: Future     Number of Occurrences:      Standing Expiration Date: 05/28/2015   The patient has a good understanding of the overall plan. she agrees with it. She will call with any problems that may develop before her next visit here.   Gudena, Vinay K, MD 05/28/2014 9:21 AM   

## 2014-05-29 ENCOUNTER — Ambulatory Visit: Payer: BC Managed Care – PPO

## 2014-06-03 ENCOUNTER — Telehealth: Payer: Self-pay

## 2014-06-03 NOTE — Telephone Encounter (Signed)
Triage Call Report rcvd from [facility] dtd 06/01/14.  Sent to scan.

## 2014-06-04 ENCOUNTER — Other Ambulatory Visit (HOSPITAL_BASED_OUTPATIENT_CLINIC_OR_DEPARTMENT_OTHER): Payer: BC Managed Care – PPO

## 2014-06-04 ENCOUNTER — Telehealth: Payer: Self-pay | Admitting: Hematology and Oncology

## 2014-06-04 ENCOUNTER — Ambulatory Visit (HOSPITAL_BASED_OUTPATIENT_CLINIC_OR_DEPARTMENT_OTHER): Payer: BC Managed Care – PPO | Admitting: Hematology and Oncology

## 2014-06-04 VITALS — BP 127/65 | HR 72 | Temp 98.5°F | Resp 18 | Ht 64.0 in | Wt 155.8 lb

## 2014-06-04 DIAGNOSIS — C50311 Malignant neoplasm of lower-inner quadrant of right female breast: Secondary | ICD-10-CM

## 2014-06-04 DIAGNOSIS — Z170 Estrogen receptor positive status [ER+]: Secondary | ICD-10-CM

## 2014-06-04 LAB — COMPREHENSIVE METABOLIC PANEL (CC13)
ALK PHOS: 113 U/L (ref 40–150)
ALT: 30 U/L (ref 0–55)
ANION GAP: 6 meq/L (ref 3–11)
AST: 24 U/L (ref 5–34)
Albumin: 3.5 g/dL (ref 3.5–5.0)
BUN: 8.7 mg/dL (ref 7.0–26.0)
CO2: 27 mEq/L (ref 22–29)
CREATININE: 0.8 mg/dL (ref 0.6–1.1)
Calcium: 9.5 mg/dL (ref 8.4–10.4)
Chloride: 106 mEq/L (ref 98–109)
Glucose: 93 mg/dl (ref 70–140)
Potassium: 4.3 mEq/L (ref 3.5–5.1)
Sodium: 139 mEq/L (ref 136–145)
Total Bilirubin: 0.29 mg/dL (ref 0.20–1.20)
Total Protein: 6.6 g/dL (ref 6.4–8.3)

## 2014-06-04 LAB — CBC WITH DIFFERENTIAL/PLATELET
BASO%: 1.8 % (ref 0.0–2.0)
Basophils Absolute: 0 10*3/uL (ref 0.0–0.1)
EOS%: 0.9 % (ref 0.0–7.0)
Eosinophils Absolute: 0 10*3/uL (ref 0.0–0.5)
HCT: 29.1 % — ABNORMAL LOW (ref 34.8–46.6)
HGB: 9.8 g/dL — ABNORMAL LOW (ref 11.6–15.9)
LYMPH%: 70.3 % — AB (ref 14.0–49.7)
MCH: 31.4 pg (ref 25.1–34.0)
MCHC: 33.7 g/dL (ref 31.5–36.0)
MCV: 93.3 fL (ref 79.5–101.0)
MONO#: 0.1 10*3/uL (ref 0.1–0.9)
MONO%: 6.3 % (ref 0.0–14.0)
NEUT#: 0.2 10*3/uL — CL (ref 1.5–6.5)
NEUT%: 20.7 % — ABNORMAL LOW (ref 38.4–76.8)
NRBC: 0 % (ref 0–0)
Platelets: 359 10*3/uL (ref 145–400)
RBC: 3.12 10*6/uL — AB (ref 3.70–5.45)
RDW: 14.1 % (ref 11.2–14.5)
WBC: 1.1 10*3/uL — ABNORMAL LOW (ref 3.9–10.3)
lymph#: 0.8 10*3/uL — ABNORMAL LOW (ref 0.9–3.3)

## 2014-06-04 NOTE — Addendum Note (Signed)
Addended by: Prentiss Bells on: 06/04/2014 01:40 PM   Modules accepted: Orders, Medications

## 2014-06-04 NOTE — Progress Notes (Signed)
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 No right axillary lymphadenopathy.    02/14/2014 Initial Diagnosis Breast cancer-right Er 100%, PR 94%, Ki 67: 17%; Her2 Neg ratio 1.74; T1bN0Mo (Clinical Stage 1A)   04/02/2014 Surgery Right breast lumpectomy: Invasive ductal carcinoma negative for lymph vascular invasion, 1.6 cm, grade 2, 3 SLN negative, reexcision margins benign, ER 100%, PR 94%, HER-2 negative Ki-67 70% T1 C. N0 M0 stage IA: Oncotype DX 29 (19%ROR)   05/07/2014 -  Chemotherapy Taxotere Cytoxan adjuvant chemotherapy every 3 weeks x4 cycles (after cycle one hospitalization for pain related to Neulasta and severe gastritis due to steroids)    CHIEF COMPLIANT: Toxicity check today is cycle 2 day 8  INTERVAL HISTORY: Tami [name] is a 63 year old African American lady with above-mentioned history of right-sided breast cancer treated with lumpectomy and is currently on adjuvant chemotherapy. She received cycle 2 of Taxotere Cytoxan a week ago and is yesterday for toxicity check. With cycle 2 we have discontinued Neulasta as well as decrease his steroid dosage and overall she is feeling significantly better than the last cycle. She denies any fevers or chills. Denies any nausea vomiting. She has a slight mouth sore but overall the thrush that came back  after cycle 2 has resolved very well.  REVIEW OF SYSTEMS:   Constitutional: Denies fevers, chills or abnormal weight loss Eyes: Denies blurriness of vision Ears, nose, mouth, throat, and face: thrush has resolved Respiratory: Denies cough, dyspnea or wheezes Cardiovascular: Denies palpitation, chest discomfort or lower extremity swelling Gastrointestinal:  Denies nausea, heartburn or change in bowel habits Skin: Denies abnormal skin rashes Lymphatics: Denies new lymphadenopathy or easy bruising Neurological:Denies numbness, tingling or new weaknesses Behavioral/Psych: Mood is stable, no new changes  All other systems were reviewed with the patient and are negative.  I have reviewed the past medical history, past surgical history, social history and family history with the patient and they are unchanged from previous note.  ALLERGIES:  is allergic to latex; sulfonamide derivatives; clarithromycin; crestor; statins; and ciprofloxacin hcl.  MEDICATIONS:  Current Outpatient Prescriptions  Medication Sig Dispense Refill  . amitriptyline (ELAVIL) 25 MG tablet Take 25 mg by mouth at bedtime as needed for sleep. When patient has migraines    . aspirin EC 81 MG tablet Take 81 mg by mouth daily.    . celecoxib (CELEBREX) 200 MG capsule Take 200 mg by mouth 2 (two) times daily as needed for moderate pain.    [name] dexamethasone (DECADRON) 4 MG tablet Take 2 tablets (8 mg total) by mouth 2 (two) times daily. Start the day before Taxotere. Then again the day after chemo for 3 days. 30 tablet 1  . flecainide (TAMBOCOR) 100 MG tablet Take 0.5 tablets (50 mg total) by mouth 2 (two) times daily. 90 tablet 3  .  fluconazole (DIFLUCAN) 100 MG tablet Take 1 tablet (100 mg total) by mouth daily. 7 tablet 0  . gabapentin (NEURONTIN) 300 MG capsule Take 1 capsule (300 mg total) by mouth 3 (three) times daily. PATIENT NEEDS OFFICE VISIT FOR ADDITIONAL REFILLS 90 capsule 0  . HYDROcodone-acetaminophen (NORCO) 5-325  MG per tablet Take 1-2 tablets by mouth every 6 (six) hours as needed. (Patient taking differently: Take 1-2 tablets by mouth every 6 (six) hours as needed for moderate pain. ) 20 tablet 0  . HYDROcodone-acetaminophen (NORCO) 5-325 MG per tablet Take 1 tablet by mouth every 6 (six) hours as needed for moderate pain. 30 tablet 0  . ibuprofen (ADVIL,MOTRIN) 200 MG tablet Take 200 mg by mouth every 6 (six) hours as needed for headache or moderate pain.    [name] lidocaine-prilocaine (EMLA) cream Apply 1 application topically as needed. 30 g 0  . LORazepam (ATIVAN) 0.5 MG tablet Take 1 tablet (0.5 mg total) by mouth every 6 (six) hours as needed (Nausea or vomiting). 30 tablet 0  . Meth-Hyo-M Bl-Na Phos-Ph Sal (URIBEL) 118 MG CAPS Take 118 mg by mouth 4 (four) times daily as needed (for bladder).   2  . metoprolol succinate (TOPROL-XL) 50 MG 24 hr tablet Take 1 tablet (50 mg total) by mouth daily. Take with or immediately following a meal. 90 tablet 3  . ondansetron (ZOFRAN) 4 MG tablet   0  . ondansetron (ZOFRAN) 8 MG tablet Take 1 tablet (8 mg total) by mouth 2 (two) times daily. Start the day after chemo for 3 days. Then take as needed for nausea or vomiting. 30 tablet 1  . pantoprazole (PROTONIX) 40 MG tablet Take 1 tablet (40 mg total) by mouth daily. 30 tablet 0  . PARoxetine (PAXIL) 20 MG tablet Take 1 tablet (20 mg total) by mouth daily. 90 tablet 2  . prochlorperazine (COMPAZINE) 10 MG tablet Take 1 tablet (10 mg total) by mouth every 6 (six) hours as needed (Nausea or vomiting). 30 tablet 1  . UNABLE TO FIND Apply 1 each topically as needed. 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 valACYclovir (VALTREX) 500 MG tablet Take 1 tablet (500 mg total) by mouth 2 (two) times daily as needed. 60 tablet 11   No current facility-administered medications for this visit.    PHYSICAL EXAMINATION: ECOG PERFORMANCE STATUS: 1 - Symptomatic but completely  ambulatory  Filed Vitals:   06/04/14 1121  BP: 127/65  Pulse: 72  Temp: 98.5 F (36.9 C)  Resp: 18   Filed Weights   06/04/14 1121  Weight: 155 lb 12.8 oz (70.67 kg)    GENERAL:alert, no distress and comfortable SKIN: skin color, texture, turgor are normal, no rashes or significant lesions EYES: normal, Conjunctiva are pink and non-injected, sclera clear OROPHARYNX:pressure is resolved, small mouth sore NECK: supple, thyroid normal size, non-tender, without nodularity LYMPH:  no palpable lymphadenopathy in the cervical, axillary or inguinal LUNGS: clear to auscultation and percussion with normal breathing effort HEART: regular rate & rhythm and no murmurs and no lower extremity edema ABDOMEN:abdomen soft, non-tender and normal bowel sounds Musculoskeletal:no cyanosis of digits and no clubbing  NEURO: alert & oriented x 3 with fluent speech, no focal motor/sensory deficits  LABORATORY DATA:  I have reviewed the data as listed   Chemistry      Component Value Date/Time   NA 139 05/28/2014 0849   NA 142 05/15/2014 0500   K 4.0  05/28/2014 0849   K 3.8 05/15/2014 0500   CL 104 05/15/2014 0500   CO2 23 05/28/2014 0849   CO2 26 05/15/2014 0500   BUN 7.2 05/28/2014 0849   BUN 4* 05/15/2014 0500   CREATININE 0.8 05/28/2014 0849   CREATININE 0.80 05/15/2014 0500   CREATININE 0.83 03/27/2014 1102      Component Value Date/Time   CALCIUM 9.7 05/28/2014 0849   CALCIUM 8.6 05/15/2014 0500   ALKPHOS 132 05/28/2014 0849   ALKPHOS 125* 05/15/2014 0500   AST 20 05/28/2014 0849   AST 37 05/15/2014 0500   ALT 37 05/28/2014 0849   ALT 89* 05/15/2014 0500   BILITOT 0.44 05/28/2014 0849   BILITOT <0.2* 05/15/2014 0500       Lab Results  Component Value Date   WBC 1.1* 06/04/2014   HGB 9.8* 06/04/2014   HCT 29.1* 06/04/2014   MCV 93.3 06/04/2014   PLT 359 06/04/2014   NEUTROABS 0.2* 06/04/2014    ASSESSMENT & PLAN:  Breast cancer of lower-inner quadrant of right female  breast 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 status post lumpectomy, Oncotype DX recurrence score 29 intermediate risk 19% risk of recurrence with hormonal therapy alone  Today is cycle 2 day 8 of chemotherapy with Taxotere and Cytoxan that was started on 05/07/2014. Changes made to cycle 2: Reduction in dose, and elimination of Neulasta, decrease in steroids  Chemotoxicities: patient experienced the following toxicities to chemotherapy - thrush improved with Diflucan - Diffuse body aches but significantly better since we did not give Neulasta - Chemotherapy-induced neutropenia today's ANC 200, provided her neutropenic precautions and give her masks - Chemotherapy-induced anemia - Constipation improved with stool softeners  Treatment plan: Return to clinic in 2 weeks for cycle #3 we will continue the same dosage and regimen. I discussed with the patient that because we are not doing it lasted, it might take longer for count recovery.   Orders Placed This Encounter  Procedures  . CBC with Differential    Standing Status: Future     Number of Occurrences:      Standing Expiration Date: 06/04/2015  . Comprehensive metabolic panel (Cmet) - [facility]    Standing Status: Future     Number of Occurrences:      Standing Expiration Date: 06/04/2015   The patient has a good understanding of the overall plan. she agrees with it. She will call with any problems that may develop before her next visit here.   Rulon Eisenmenger, MD 06/04/2014 11:45 AM

## 2014-06-04 NOTE — Assessment & Plan Note (Addendum)
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 status post lumpectomy, Oncotype DX recurrence score 29 intermediate risk 19% risk of recurrence with hormonal therapy alone  Today is cycle 2 day 8 of chemotherapy with Taxotere and Cytoxan that was started on 05/07/2014. Changes made to cycle 2: Reduction in dose, and elimination of Neulasta, decrease in steroids  Chemotoxicities: patient experienced the following toxicities to chemotherapy - thrush improved with Diflucan - Diffuse body aches but significantly better since we did not give Neulasta - Chemotherapy-induced neutropenia today's ANC 200, provided her neutropenic precautions and give her masks - Chemotherapy-induced anemia - Constipation improved with stool softeners  Treatment plan: Return to clinic in 2 weeks for cycle #3 we will continue the same dosage and regimen. I discussed with the patient that because we are not doing it lasted, it might take longer for count recovery.

## 2014-06-04 NOTE — Telephone Encounter (Signed)
, °

## 2014-06-07 ENCOUNTER — Encounter (INDEPENDENT_AMBULATORY_CARE_PROVIDER_SITE_OTHER): Payer: Self-pay | Admitting: General Surgery

## 2014-06-07 NOTE — Progress Notes (Signed)
Patient ID: Tami [name], female   DOB: 01/31/1951, 63 y.o.   MRN: [id number]  atricia A. [name] 06/07/2014 4:20 PM Location: [facility] Patient #: [id number] DOB: 01-13-1951 Married / Language: English / Race: Black or African American Female History of Present Illness Tami Hollingshead MD; 06/07/2014 4:37 PM) Patient words: post op port a cath.  The patient is a 63 year old female    Note:Procedure: Right axillary lymphatic mapping. Right axillary sentinel lymph node biopsy (3 lymph nodes). Right partial mastectomy after wire localization  Date: 04/02/14  Pathology: T1cN0, ER/PR positive, Oncotype 29.  History: She is here for her second postoperative visit. She has started systemic chemotherapy.  Exam: General- Is in NAD. Right breast-wound is clean and intact  Right axilla-wound is clean and intact  Left breast-no masses or suspicious skin changes.  Allergies Festus Holts, LPN; 98/03/2118 4:17 PM) Latex Clarithromycin *CHEMICALS* Crestor *ANTIHYPERLIPIDEMICS* Statins Depletion *DIETARY PRODUCTS/DIETARY MANAGEMENT PRODUCTS* Ciprofloxacin *CHEMICALS* Sulfonated Castor Oil *DERMATOLOGICALS*  Medication History Festus Holts, LPN; 40/02/1447 1:85 PM) Amitriptyline HCl (25MG  Tablet, Oral) Active. Celecoxib (200MG  Capsule, Oral) Active. Dexamethasone (4MG  Tablet, Oral) Active. Flecainide Acetate (100MG  Tablet, Oral) Active. Gabapentin (300MG  Capsule, Oral) Active. Hydrocodone-Acetaminophen (5-325MG  Tablet, Oral) Active. Metoprolol Succinate ER (50MG  Tablet ER 24HR, Oral) Active. Ondansetron HCl (4MG  Tablet, Oral) Active. Prochlorperazine Maleate (10MG  Tablet, Oral) Active. Aspirin EC (81MG  Tablet DR, Oral) Active. B Complex (Oral) Active. Vitamin D (1000UNIT Capsule, Oral) Active. Magnesium Gluconate (250MG  Tablet, Oral) Active. Omega 3 (1000MG  Capsule, Oral) Active. Valtrex (500MG  Tablet, Oral) Active. Paxil (20MG  Tablet,  Oral) Active.    Vitals Festus Holts LPN; 63/07/4968 2:63 PM) 06/07/2014 4:20 PM Weight: 158.38 lb Height: 64in Body Surface Area: 1.8 m Body Mass Index: 27.18 kg/m Temp.: 98.55F(Oral)  Pulse: 67 (Regular)  Resp.: 16 (Unlabored)  BP: 116/62 (Sitting, Left Arm, Standard)     Assessment & Plan Tami Hollingshead MD; 06/07/2014 4:38 PM)  POST-OPERATIVE STATE [phone]) Impression: Wound healing well.  Plan: RTC in 3 months.  Jackolyn Confer, MD

## 2014-06-10 ENCOUNTER — Encounter: Payer: Self-pay | Admitting: Cardiovascular Disease

## 2014-06-12 ENCOUNTER — Other Ambulatory Visit: Payer: Self-pay | Admitting: *Deleted

## 2014-06-12 ENCOUNTER — Telehealth: Payer: Self-pay | Admitting: *Deleted

## 2014-06-12 DIAGNOSIS — C50311 Malignant neoplasm of lower-inner quadrant of right female breast: Secondary | ICD-10-CM

## 2014-06-12 MED ORDER — SPIRONOLACTONE 25 MG PO TABS: 25.0000 mg | ORAL_TABLET | Freq: Every day | ORAL | Status: DC

## 2014-06-12 MED ORDER — TRIAMTERENE-HCTZ 50-25 MG PO CAPS: 1.0000 | ORAL_CAPSULE | Freq: Every day | ORAL | Status: DC | PRN

## 2014-06-12 NOTE — Telephone Encounter (Signed)
This RN spoke with pt's pharmacy per their call regarding concern for interaction of dyazide with sulfa allergy.  Per discussion spirolactone is better diuretic in pt's with sulfa allergies.  Prescription obtained.  This RN spoke with pt per above including best to start with 1/2 tablet for benefit with least amount of side effects.  Pt verbalized understanding.

## 2014-06-12 NOTE — Telephone Encounter (Signed)
Patient called stating that her ankles have been swollen since the day before yesterday. Has tried elevating them but edema only decreases temporarily. States that it hurts to walk on her feet due to swelling. She reports feeling a little winded with exertion. Spoke with Dr. Jana Hakim and rx for dyazide sent to patient's pharmacy. Patient instructed to take 1 pill daily as needed. She is to call us back if no improvement in swelling. Patient verbalized understanding.

## 2014-06-18 ENCOUNTER — Other Ambulatory Visit: Payer: Self-pay

## 2014-06-18 ENCOUNTER — Other Ambulatory Visit (HOSPITAL_BASED_OUTPATIENT_CLINIC_OR_DEPARTMENT_OTHER): Payer: BC Managed Care – PPO

## 2014-06-18 ENCOUNTER — Ambulatory Visit (HOSPITAL_BASED_OUTPATIENT_CLINIC_OR_DEPARTMENT_OTHER): Payer: BC Managed Care – PPO

## 2014-06-18 ENCOUNTER — Ambulatory Visit (HOSPITAL_BASED_OUTPATIENT_CLINIC_OR_DEPARTMENT_OTHER): Payer: BC Managed Care – PPO | Admitting: Hematology and Oncology

## 2014-06-18 ENCOUNTER — Other Ambulatory Visit: Payer: BC Managed Care – PPO

## 2014-06-18 VITALS — BP 113/56 | HR 57 | Temp 98.1°F | Resp 18 | Ht 64.0 in | Wt 159.0 lb

## 2014-06-18 DIAGNOSIS — D6481 Anemia due to antineoplastic chemotherapy: Secondary | ICD-10-CM

## 2014-06-18 DIAGNOSIS — C50311 Malignant neoplasm of lower-inner quadrant of right female breast: Secondary | ICD-10-CM

## 2014-06-18 DIAGNOSIS — Z170 Estrogen receptor positive status [ER+]: Secondary | ICD-10-CM

## 2014-06-18 DIAGNOSIS — Z5111 Encounter for antineoplastic chemotherapy: Secondary | ICD-10-CM

## 2014-06-18 DIAGNOSIS — D701 Agranulocytosis secondary to cancer chemotherapy: Secondary | ICD-10-CM

## 2014-06-18 LAB — COMPREHENSIVE METABOLIC PANEL (CC13)
ALT: 55 U/L (ref 0–55)
ANION GAP: 8 meq/L (ref 3–11)
AST: 35 U/L — AB (ref 5–34)
Albumin: 3.5 g/dL (ref 3.5–5.0)
Alkaline Phosphatase: 110 U/L (ref 40–150)
BUN: 9.8 mg/dL (ref 7.0–26.0)
CALCIUM: 9.1 mg/dL (ref 8.4–10.4)
CHLORIDE: 108 meq/L (ref 98–109)
CO2: 25 meq/L (ref 22–29)
Creatinine: 0.9 mg/dL (ref 0.6–1.1)
EGFR: 84 mL/min/{1.73_m2} — ABNORMAL LOW (ref >90)
Glucose: 99 mg/dl (ref 70–140)
Potassium: 4.5 mEq/L (ref 3.5–5.1)
Sodium: 142 mEq/L (ref 136–145)
Total Bilirubin: 0.38 mg/dL (ref 0.20–1.20)
Total Protein: 6.7 g/dL (ref 6.4–8.3)

## 2014-06-18 LAB — CBC WITH DIFFERENTIAL/PLATELET
BASO%: 0.3 % (ref 0.0–2.0)
Basophils Absolute: 0 10*3/uL (ref 0.0–0.1)
EOS%: 0.3 % (ref 0.0–7.0)
Eosinophils Absolute: 0 10*3/uL (ref 0.0–0.5)
HCT: 32.8 % — ABNORMAL LOW (ref 34.8–46.6)
HEMOGLOBIN: 10.7 g/dL — AB (ref 11.6–15.9)
LYMPH#: 1.2 10*3/uL (ref 0.9–3.3)
LYMPH%: 17.3 % (ref 14.0–49.7)
MCH: 30.7 pg (ref 25.1–34.0)
MCHC: 32.6 g/dL (ref 31.5–36.0)
MCV: 94.3 fL (ref 79.5–101.0)
MONO#: 0.6 10*3/uL (ref 0.1–0.9)
MONO%: 8.2 % (ref 0.0–14.0)
NEUT#: 5 10*3/uL (ref 1.5–6.5)
NEUT%: 73.9 % (ref 38.4–76.8)
Platelets: 213 10*3/uL (ref 145–400)
RBC: 3.48 10*6/uL — ABNORMAL LOW (ref 3.70–5.45)
RDW: 15.1 % — ABNORMAL HIGH (ref 11.2–14.5)
WBC: 6.7 10*3/uL (ref 3.9–10.3)

## 2014-06-18 MED ORDER — ONDANSETRON 16 MG/50ML IVPB (CHCC)
INTRAVENOUS | Status: AC
  Filled 2014-06-18: qty 16

## 2014-06-18 MED ORDER — HEPARIN SOD (PORK) LOCK FLUSH 100 UNIT/ML IV SOLN
500.0000 [IU] | Freq: Once | INTRAVENOUS | Status: AC | PRN
  Administered 2014-06-18: 500 [IU]
  Filled 2014-06-18: qty 5

## 2014-06-18 MED ORDER — ONDANSETRON 16 MG/50ML IVPB (CHCC)
16.0000 mg | Freq: Once | INTRAVENOUS | Status: AC
  Administered 2014-06-18: 16 mg via INTRAVENOUS

## 2014-06-18 MED ORDER — PANTOPRAZOLE SODIUM 40 MG PO TBEC
40.0000 mg | DELAYED_RELEASE_TABLET | Freq: Every day | ORAL | Status: DC
Start: 2014-06-18 — End: 2014-07-09

## 2014-06-18 MED ORDER — DEXAMETHASONE SODIUM PHOSPHATE 20 MG/5ML IJ SOLN
20.0000 mg | Freq: Once | INTRAMUSCULAR | Status: AC
  Administered 2014-06-18: 20 mg via INTRAVENOUS

## 2014-06-18 MED ORDER — SODIUM CHLORIDE 0.9 % IV SOLN
500.0000 mg/m2 | Freq: Once | INTRAVENOUS | Status: AC
  Administered 2014-06-18: 880 mg via INTRAVENOUS
  Filled 2014-06-18: qty 44

## 2014-06-18 MED ORDER — SODIUM CHLORIDE 0.9 % IJ SOLN
10.0000 mL | INTRAMUSCULAR | Status: DC | PRN
Start: 2014-06-18 — End: 2014-06-18
  Administered 2014-06-18: 10 mL
  Filled 2014-06-18: qty 10

## 2014-06-18 MED ORDER — SODIUM CHLORIDE 0.9 % IV SOLN
Freq: Once | INTRAVENOUS | Status: AC
  Administered 2014-06-18: 11:00:00 via INTRAVENOUS

## 2014-06-18 MED ORDER — DOCETAXEL CHEMO INJECTION 160 MG/16ML
65.0000 mg/m2 | Freq: Once | INTRAVENOUS | Status: AC
  Administered 2014-06-18: 120 mg via INTRAVENOUS
  Filled 2014-06-18: qty 12

## 2014-06-18 MED ORDER — FLUCONAZOLE 100 MG PO TABS: 100.0000 mg | ORAL_TABLET | Freq: Every day | ORAL | Status: DC

## 2014-06-18 MED ORDER — PANTOPRAZOLE SODIUM 40 MG PO TBEC: 40.0000 mg | DELAYED_RELEASE_TABLET | Freq: Every day | ORAL | Status: DC

## 2014-06-18 MED ORDER — DEXAMETHASONE SODIUM PHOSPHATE 20 MG/5ML IJ SOLN
INTRAMUSCULAR | Status: AC
  Filled 2014-06-18: qty 5

## 2014-06-18 NOTE — Assessment & Plan Note (Addendum)
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 status post lumpectomy, Oncotype DX recurrence score 29 intermediate risk 19% risk of recurrence with hormonal therapy alone  Today is cycle 3 day 1 of chemotherapy with Taxotere and Cytoxan that was started on 05/07/2014. Changes made since cycle 2: Reduction in dose, and elimination of Neulasta, decrease in steroids  Chemotoxicities: patient experienced the following toxicities to chemotherapy - thrush improved with Diflucan - Diffuse body aches: significantly better since we did not give Neulasta - Chemotherapy-induced neutropenia grade 3: Unable to give Neulasta because of bone pain. - Chemotherapy-induced anemia: Being monitored - Constipation improved with stool softeners In spite of not receiving Neulasta, her blood counts today are adequate and she will go ahead and receive cycle 3 of chemotherapy. we will continue the same dosage and regimen.  Treatment plan: Return to clinic in 1 week for nadir count check.  

## 2014-06-18 NOTE — Progress Notes (Signed)
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 No right axillary lymphadenopathy.    02/14/2014 Initial Diagnosis Breast cancer-right Er 100%, PR 94%, Ki 67: 17%; Her2 Neg ratio 1.74; T1bN0Mo (Clinical Stage 1A)   04/02/2014 Surgery Right breast lumpectomy: Invasive ductal carcinoma negative for lymph vascular invasion, 1.6 cm, grade 2, 3 SLN negative, reexcision margins benign, ER 100%, PR 94%, HER-2 negative Ki-67 70% T1 C. N0 M0 stage IA: Oncotype DX 29 (19%ROR)   05/07/2014 -  Chemotherapy Taxotere Cytoxan adjuvant chemotherapy every 3 weeks x4 cycles (after cycle one hospitalization for pain related to Neulasta and severe gastritis due to steroids)    CHIEF COMPLIANT: Cycle 3 Taxotere Cytoxan  INTERVAL HISTORY: Tami [name] is a 63 year old left leg related above-mentioned history of right-sided breast cancer treated with lumpectomy and is now on adjuvant chemotherapy with Taxotere and Cytoxan. After cycle 2 we had to reduce the dosage and not give Neulasta because Neulasta related side effects. We also to reduce the steroids that has prevented the gastritis. She is feeling significantly better and tolerating this dose level very well. Denies any nausea vomiting or fevers or chills.  REVIEW OF SYSTEMS:   Constitutional: Denies fevers, chills or abnormal  weight loss; alopecia Eyes: Denies blurriness of vision Ears, nose, mouth, throat, and face: Denies mucositis or sore throat Respiratory: Denies cough, dyspnea or wheezes Cardiovascular: Denies palpitation, chest discomfort or lower extremity swelling Gastrointestinal:  Denies nausea, heartburn or change in bowel habits Skin: Denies abnormal skin rashes Lymphatics: Denies new lymphadenopathy or easy bruising Neurological:Denies numbness, tingling or new weaknesses Behavioral/Psych: Mood is stable, no new changes  Breast:  denies any pain or lumps or nodules in either breasts All other systems were reviewed with the patient and are negative.  I have reviewed the past medical history, past surgical history, social history and family history with the patient and they are unchanged from previous note.  ALLERGIES:  is allergic to latex; sulfonamide derivatives; clarithromycin; crestor; statins; and ciprofloxacin hcl.  MEDICATIONS:  Current Outpatient Prescriptions  Medication Sig Dispense Refill  . amitriptyline (ELAVIL) 25 MG tablet Take 25 mg by mouth at bedtime as needed for sleep. When patient has migraines    . aspirin EC 81 MG tablet Take 81 mg by mouth daily.    . celecoxib (CELEBREX) 200 MG capsule Take 200 mg by mouth 2 (two) times daily as needed for moderate pain.    [name] dexamethasone (DECADRON) 4 MG tablet Take 2 tablets (8 mg total) by mouth 2 (two) times daily. Start the day before Taxotere. Then again the day after chemo for 3 days. 30 tablet 1  . flecainide (TAMBOCOR) 100 MG tablet Take 0.5 tablets (50 mg total) by mouth 2 (two) times daily. 90 tablet 3  . fluconazole (DIFLUCAN) 100 MG tablet Take 1  tablet (100 mg total) by mouth daily. 7 tablet 0  . gabapentin (NEURONTIN) 300 MG capsule Take 1 capsule (300 mg total) by mouth 3 (three) times daily. PATIENT NEEDS OFFICE VISIT FOR ADDITIONAL REFILLS 90 capsule 0  . HYDROcodone-acetaminophen (NORCO) 5-325 MG per tablet Take 1-2  tablets by mouth every 6 (six) hours as needed. (Patient taking differently: Take 1-2 tablets by mouth every 6 (six) hours as needed for moderate pain. ) 20 tablet 0  . HYDROcodone-acetaminophen (NORCO) 5-325 MG per tablet Take 1 tablet by mouth every 6 (six) hours as needed for moderate pain. 30 tablet 0  . ibuprofen (ADVIL,MOTRIN) 200 MG tablet Take 200 mg by mouth every 6 (six) hours as needed for headache or moderate pain.    [name] lidocaine-prilocaine (EMLA) cream Apply 1 application topically as needed. 30 g 0  . LORazepam (ATIVAN) 0.5 MG tablet Take 1 tablet (0.5 mg total) by mouth every 6 (six) hours as needed (Nausea or vomiting). 30 tablet 0  . Meth-Hyo-M Bl-Na Phos-Ph Sal (URIBEL) 118 MG CAPS Take 118 mg by mouth 4 (four) times daily as needed (for bladder).   2  . metoprolol succinate (TOPROL-XL) 50 MG 24 hr tablet Take 1 tablet (50 mg total) by mouth daily. Take with or immediately following a meal. 90 tablet 3  . ondansetron (ZOFRAN) 4 MG tablet   0  . ondansetron (ZOFRAN) 8 MG tablet Take 1 tablet (8 mg total) by mouth 2 (two) times daily. Start the day after chemo for 3 days. Then take as needed for nausea or vomiting. 30 tablet 1  . pantoprazole (PROTONIX) 40 MG tablet Take 1 tablet (40 mg total) by mouth daily. 30 tablet 0  . PARoxetine (PAXIL) 20 MG tablet Take 1 tablet (20 mg total) by mouth daily. 90 tablet 2  . prochlorperazine (COMPAZINE) 10 MG tablet Take 1 tablet (10 mg total) by mouth every 6 (six) hours as needed (Nausea or vomiting). 30 tablet 1  . spironolactone (ALDACTONE) 25 MG tablet Take 1 tablet (25 mg total) by mouth daily. 30 tablet 3  . UNABLE TO FIND Apply 1 each topically as needed. 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 valACYclovir (VALTREX) 500 MG tablet Take 1 tablet (500 mg total) by mouth 2 (two) times daily as needed. 60 tablet 11   No current facility-administered medications for this visit.     PHYSICAL EXAMINATION: ECOG PERFORMANCE STATUS: 1 - Symptomatic but completely ambulatory  Filed Vitals:   06/18/14 0944  BP: 113/56  Pulse: 57  Temp: 98.1 F (36.7 C)  Resp: 18   Filed Weights   06/18/14 0944  Weight: 159 lb (72.122 kg)    GENERAL:alert, no distress and comfortable SKIN: skin color, texture, turgor are normal, no rashes or significant lesions EYES: normal, Conjunctiva are pink and non-injected, sclera clear OROPHARYNX:no exudate, no erythema and lips, buccal mucosa, and tongue normal  NECK: supple, thyroid normal size, non-tender, without nodularity LYMPH:  no palpable lymphadenopathy in the cervical, axillary or inguinal LUNGS: clear to auscultation and percussion with normal breathing effort HEART: regular rate & rhythm and no murmurs and no lower extremity edema ABDOMEN:abdomen soft, non-tender and normal bowel sounds Musculoskeletal:no cyanosis of digits and no clubbing  NEURO: alert & oriented x 3 with fluent speech, no focal motor/sensory deficits  LABORATORY DATA:  I have reviewed the data as listed   Chemistry      Component Value Date/Time  NA 142 06/18/2014 0929   NA 142 05/15/2014 0500   K 4.5 06/18/2014 0929   K 3.8 05/15/2014 0500   CL 104 05/15/2014 0500   CO2 25 06/18/2014 0929   CO2 26 05/15/2014 0500   BUN 9.8 06/18/2014 0929   BUN 4* 05/15/2014 0500   CREATININE 0.9 06/18/2014 0929   CREATININE 0.80 05/15/2014 0500   CREATININE 0.83 03/27/2014 1102      Component Value Date/Time   CALCIUM 9.1 06/18/2014 0929   CALCIUM 8.6 05/15/2014 0500   ALKPHOS 110 06/18/2014 0929   ALKPHOS 125* 05/15/2014 0500   AST 35* 06/18/2014 0929   AST 37 05/15/2014 0500   ALT 55 06/18/2014 0929   ALT 89* 05/15/2014 0500   BILITOT 0.38 06/18/2014 0929   BILITOT <0.2* 05/15/2014 0500       Lab Results  Component Value Date   WBC 6.7 06/18/2014   HGB 10.7* 06/18/2014   HCT 32.8* 06/18/2014   MCV 94.3 06/18/2014   PLT 213 06/18/2014    NEUTROABS 5.0 06/18/2014    ASSESSMENT & PLAN:  Breast cancer of lower-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 status post lumpectomy, Oncotype DX recurrence score 29 intermediate risk 19% risk of recurrence with hormonal therapy alone  Today is cycle 3 day 1 of chemotherapy with Taxotere and Cytoxan that was started on 05/07/2014. Changes made since cycle 2: Reduction in dose, and elimination of Neulasta, decrease in steroids  Chemotoxicities: patient experienced the following toxicities to chemotherapy - thrush improved with Diflucan - Diffuse body aches: significantly better since we did not give Neulasta - Chemotherapy-induced neutropenia grade 3: Unable to give Neulasta because of bone pain. - Chemotherapy-induced anemia: Being monitored - Constipation improved with stool softeners In spite of not receiving Neulasta, her blood counts today are adequate and she will go ahead and receive cycle 3 of chemotherapy. we will continue the same dosage and regimen.  Treatment plan: Return to clinic in 1 week for nadir count check.    Orders Placed This Encounter  Procedures  . CBC with Differential    Standing Status: Future     Number of Occurrences:      Standing Expiration Date: 06/18/2015  . Comprehensive metabolic panel (Cmet) - [facility]    Standing Status: Future     Number of Occurrences:      Standing Expiration Date: 06/18/2015  . Ambulatory referral to Radiation Oncology    Referral Priority:  Routine    Referral Type:  Consultation    Referral Reason:  Specialty Services Required    Referred to Provider:  Wyvonnia Lora, MD    Requested Specialty:  Radiation Oncology    Number of Visits Requested:  1   The patient has a good understanding of the overall plan. she agrees with it. She will call with any problems that may develop before her next visit here.   Rulon Eisenmenger, MD 06/18/2014 10:35 AM

## 2014-06-20 ENCOUNTER — Encounter: Payer: Self-pay | Admitting: *Deleted

## 2014-06-25 ENCOUNTER — Telehealth: Payer: Self-pay | Admitting: Oncology

## 2014-06-25 ENCOUNTER — Ambulatory Visit (HOSPITAL_BASED_OUTPATIENT_CLINIC_OR_DEPARTMENT_OTHER): Payer: BC Managed Care – PPO | Admitting: Hematology and Oncology

## 2014-06-25 ENCOUNTER — Ambulatory Visit (HOSPITAL_BASED_OUTPATIENT_CLINIC_OR_DEPARTMENT_OTHER): Payer: BC Managed Care – PPO | Admitting: Lab

## 2014-06-25 ENCOUNTER — Ambulatory Visit (HOSPITAL_BASED_OUTPATIENT_CLINIC_OR_DEPARTMENT_OTHER): Payer: BC Managed Care – PPO

## 2014-06-25 VITALS — BP 89/51 | HR 79 | Temp 99.0°F | Resp 18 | Ht 64.0 in | Wt 155.1 lb

## 2014-06-25 DIAGNOSIS — E860 Dehydration: Secondary | ICD-10-CM

## 2014-06-25 DIAGNOSIS — D702 Other drug-induced agranulocytosis: Secondary | ICD-10-CM

## 2014-06-25 DIAGNOSIS — R5383 Other fatigue: Secondary | ICD-10-CM

## 2014-06-25 DIAGNOSIS — C50311 Malignant neoplasm of lower-inner quadrant of right female breast: Secondary | ICD-10-CM

## 2014-06-25 DIAGNOSIS — D6481 Anemia due to antineoplastic chemotherapy: Secondary | ICD-10-CM

## 2014-06-25 LAB — COMPREHENSIVE METABOLIC PANEL (CC13)
ALK PHOS: 112 U/L (ref 40–150)
ALT: 36 U/L (ref 0–55)
AST: 20 U/L (ref 5–34)
Albumin: 3.3 g/dL — ABNORMAL LOW (ref 3.5–5.0)
Anion Gap: 9 mEq/L (ref 3–11)
BILIRUBIN TOTAL: 0.45 mg/dL (ref 0.20–1.20)
BUN: 10.3 mg/dL (ref 7.0–26.0)
CO2: 25 mEq/L (ref 22–29)
CREATININE: 0.9 mg/dL (ref 0.6–1.1)
Calcium: 9 mg/dL (ref 8.4–10.4)
Chloride: 101 mEq/L (ref 98–109)
EGFR: 81 mL/min/{1.73_m2} — ABNORMAL LOW (ref >90)
GLUCOSE: 122 mg/dL (ref 70–140)
POTASSIUM: 4 meq/L (ref 3.5–5.1)
SODIUM: 135 meq/L — AB (ref 136–145)
Total Protein: 6.7 g/dL (ref 6.4–8.3)

## 2014-06-25 LAB — CBC WITH DIFFERENTIAL/PLATELET
BASO%: 1.2 % (ref 0.0–2.0)
Basophils Absolute: 0 10*3/uL (ref 0.0–0.1)
EOS%: 0.7 % (ref 0.0–7.0)
Eosinophils Absolute: 0 10*3/uL (ref 0.0–0.5)
HEMATOCRIT: 32.2 % — AB (ref 34.8–46.6)
HGB: 10.5 g/dL — ABNORMAL LOW (ref 11.6–15.9)
LYMPH#: 0.8 10*3/uL — AB (ref 0.9–3.3)
LYMPH%: 46 % (ref 14.0–49.7)
MCH: 30.6 pg (ref 25.1–34.0)
MCHC: 32.5 g/dL (ref 31.5–36.0)
MCV: 94.3 fL (ref 79.5–101.0)
MONO#: 0.1 10*3/uL (ref 0.1–0.9)
MONO%: 5 % (ref 0.0–14.0)
NEUT%: 47.1 % (ref 38.4–76.8)
NEUTROS ABS: 0.8 10*3/uL — AB (ref 1.5–6.5)
Platelets: 265 10*3/uL (ref 145–400)
RBC: 3.42 10*6/uL — ABNORMAL LOW (ref 3.70–5.45)
RDW: 15.1 % — ABNORMAL HIGH (ref 11.2–14.5)
WBC: 1.6 10*3/uL — AB (ref 3.9–10.3)

## 2014-06-25 MED ORDER — SODIUM CHLORIDE 0.9 % IV SOLN
Freq: Once | INTRAVENOUS | Status: AC
  Administered 2014-06-25: 16:00:00 via INTRAVENOUS
  Filled 2014-06-25: qty 500

## 2014-06-25 MED ORDER — ONDANSETRON 8 MG/50ML IVPB (CHCC)
8.0000 mg | Freq: Once | INTRAVENOUS | Status: AC
  Administered 2014-06-25: 8 mg via INTRAVENOUS

## 2014-06-25 MED ORDER — ONDANSETRON 8 MG/NS 50 ML IVPB
INTRAVENOUS | Status: AC
  Filled 2014-06-25: qty 8

## 2014-06-25 MED ORDER — ESOMEPRAZOLE MAGNESIUM 40 MG PO CPDR: 40.0000 mg | DELAYED_RELEASE_CAPSULE | Freq: Every day | ORAL | Status: DC

## 2014-06-25 NOTE — Telephone Encounter (Signed)
pt stated shwa was to come fotr trmt 12/23-adv no order in yet-pt going to trmt room fro IV fluids

## 2014-06-25 NOTE — Assessment & Plan Note (Addendum)
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 status post lumpectomy, Oncotype DX recurrence score 29 intermediate risk 19% risk of recurrence with hormonal therapy alone  Today is cycle 3 day 8 of chemotherapy with Taxotere and Cytoxan that was started on 05/07/2014. Changes made since cycle 2: Reduction in dose, and elimination of Neulasta, decrease in steroids  Chemotoxicities: patient experienced the following toxicities to chemotherapy - thrush improved with Diflucan - Diffuse body aches: significantly better since we did not give Neulasta - Chemotherapy-induced neutropenia grade 3: Unable to give Neulasta because of bone pain. - Chemotherapy-induced anemia: Being monitored - Constipation improved with stool softeners - Fatigue grade 3 - Dehydration requiring IV fluids grade 3 (leading to hypotension) - Gastritis related to steroids: Will decrease the steroids with the next chemotherapy and prescribed her Nexium - Plan on giving her IV fluids today and tomorrow  Dose of chemotherapy will be reduced for the next cycle and she'll be the last In spite of not receiving Neulasta, her blood counts were adequate  Treatment plan: Return to clinic in 2 week for cycle 4, the last cycle of chemotherapy

## 2014-06-25 NOTE — Progress Notes (Signed)
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 No right axillary lymphadenopathy.    02/14/2014 Initial Diagnosis Breast cancer-right Er 100%, PR 94%, Ki 67: 17%; Her2 Neg ratio 1.74; T1bN0Mo (Clinical Stage 1A)   04/02/2014 Surgery Right breast lumpectomy: Invasive ductal carcinoma negative for lymph vascular invasion, 1.6 cm, grade 2, 3 SLN negative, reexcision margins benign, ER 100%, PR 94%, HER-2 negative Ki-67 70% T1 C. N0 M0 stage IA: Oncotype DX 29 (19%ROR)   05/07/2014 -  Chemotherapy Taxotere Cytoxan adjuvant chemotherapy every 3 weeks x4 cycles (after cycle one hospitalization for pain related to Neulasta and severe gastritis due to steroids)    CHIEF COMPLIANT: Cycle 3 day 8 of chemotherapy with Taxotere and Cytoxan Feels ill, sleeping a lot, epigastric pain, nausea, constipation, generalized fatigue  INTERVAL HISTORY: Tami [name] is a 63 year old lady with above-mentioned history of right-sided breast cancer currently on adjuvant chemotherapy today cycle 3 date of treatment. She appears to a fairly ill today. She had thrown up for 4 times after the last chemotherapy. She is been sleeping most of the time. She also has epigastric discomfort. She has not been drinking enough liquids.  REVIEW OF SYSTEMS:   Constitutional: Ill-appearing Eyes: Denies  blurriness of vision Ears, nose, mouth, throat, and face: Denies mucositis or sore throat Respiratory: Denies cough, dyspnea or wheezes Cardiovascular: Denies palpitation, chest discomfort or lower extremity swelling Gastrointestinal:  Epigastric abdominal pain Skin: Denies abnormal skin rashes Lymphatics: Denies new lymphadenopathy or easy bruising Neurological:Denies numbness, tingling or new weaknesses Behavioral/Psych: Mood is stable, no new changes  All other systems were reviewed with the patient and are negative.  I have reviewed the past medical history, past surgical history, social history and family history with the patient and they are unchanged from previous note.  ALLERGIES:  is allergic to latex; sulfonamide derivatives; clarithromycin; crestor; statins; and ciprofloxacin hcl.  MEDICATIONS:  Current Outpatient Prescriptions  Medication Sig Dispense Refill  . amitriptyline (ELAVIL) 25 MG tablet Take 25 mg by mouth at bedtime as needed for sleep. When patient has migraines    . aspirin EC 81 MG tablet Take 81 mg by mouth daily.    . celecoxib (CELEBREX) 200 MG capsule Take 200 mg by mouth 2 (two) times daily as needed for moderate pain.    [name] dexamethasone (DECADRON) 4 MG tablet Take 2 tablets (8 mg total) by mouth 2 (two) times daily. Start the day before Taxotere. Then again the day after chemo for 3 days. 30 tablet 1  . flecainide (TAMBOCOR) 100 MG tablet Take 0.5 tablets (50 mg total) by mouth 2 (two) times daily. 90 tablet 3  . fluconazole (DIFLUCAN) 100 MG tablet Take 1 tablet (100 mg total) by mouth daily. 7 tablet 0  . gabapentin (NEURONTIN) 300 MG capsule Take 1 capsule (300 mg  total) by mouth 3 (three) times daily. PATIENT NEEDS OFFICE VISIT FOR ADDITIONAL REFILLS 90 capsule 0  . HYDROcodone-acetaminophen (NORCO) 5-325 MG per tablet Take 1-2 tablets by mouth every 6 (six) hours as needed. (Patient taking differently: Take 1-2 tablets by mouth every 6 (six) hours as  needed for moderate pain. ) 20 tablet 0  . HYDROcodone-acetaminophen (NORCO) 5-325 MG per tablet Take 1 tablet by mouth every 6 (six) hours as needed for moderate pain. 30 tablet 0  . ibuprofen (ADVIL,MOTRIN) 200 MG tablet Take 200 mg by mouth every 6 (six) hours as needed for headache or moderate pain.    [name] lidocaine-prilocaine (EMLA) cream Apply 1 application topically as needed. 30 g 0  . LORazepam (ATIVAN) 0.5 MG tablet Take 1 tablet (0.5 mg total) by mouth every 6 (six) hours as needed (Nausea or vomiting). 30 tablet 0  . Meth-Hyo-M Bl-Na Phos-Ph Sal (URIBEL) 118 MG CAPS Take 118 mg by mouth 4 (four) times daily as needed (for bladder).   2  . metoprolol succinate (TOPROL-XL) 50 MG 24 hr tablet Take 1 tablet (50 mg total) by mouth daily. Take with or immediately following a meal. 90 tablet 3  . ondansetron (ZOFRAN) 4 MG tablet   0  . ondansetron (ZOFRAN) 8 MG tablet Take 1 tablet (8 mg total) by mouth 2 (two) times daily. Start the day after chemo for 3 days. Then take as needed for nausea or vomiting. 30 tablet 1  . pantoprazole (PROTONIX) 40 MG tablet Take 1 tablet (40 mg total) by mouth daily. 30 tablet 0  . PARoxetine (PAXIL) 20 MG tablet Take 1 tablet (20 mg total) by mouth daily. 90 tablet 2  . prochlorperazine (COMPAZINE) 10 MG tablet Take 1 tablet (10 mg total) by mouth every 6 (six) hours as needed (Nausea or vomiting). 30 tablet 1  . spironolactone (ALDACTONE) 25 MG tablet Take 1 tablet (25 mg total) by mouth daily. 30 tablet 3  . UNABLE TO FIND Apply 1 each topically as needed. 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 valACYclovir (VALTREX) 500 MG tablet Take 1 tablet (500 mg total) by mouth 2 (two) times daily as needed. 60 tablet 11   No current facility-administered medications for this visit.    PHYSICAL EXAMINATION: ECOG PERFORMANCE STATUS: 3 - Symptomatic, >50% confined to bed  Filed Vitals:   06/25/14 1441  BP:  89/51  Pulse: 79  Temp: 99 F (37.2 C)  Resp: 18   Filed Weights   06/25/14 1441  Weight: 155 lb 1.6 oz (70.353 kg)    GENERAL:alert, no distress and comfortable SKIN: skin color, texture, turgor are normal, no rashes or significant lesions EYES: normal, Conjunctiva are pink and non-injected, sclera clear OROPHARYNX: Thrush is improved with Diflucan NECK: supple, thyroid normal size, non-tender, without nodularity LYMPH:  no palpable lymphadenopathy in the cervical, axillary or inguinal LUNGS: clear to auscultation and percussion with normal breathing effort HEART: regular rate & rhythm and no murmurs and no lower extremity edema ABDOMEN:abdomen soft, non-tender and normal bowel sounds Musculoskeletal:no cyanosis of digits and no clubbing  NEURO: alert & oriented x 3 with fluent speech, no focal motor/sensory deficits  LABORATORY DATA:  I have reviewed the data as listed   Chemistry      Component Value Date/Time   NA 135* 06/25/2014 1410   NA 142 05/15/2014 0500   K 4.0 06/25/2014 1410   K 3.8 05/15/2014 0500  CL 104 05/15/2014 0500   CO2 25 06/25/2014 1410   CO2 26 05/15/2014 0500   BUN 10.3 06/25/2014 1410   BUN 4* 05/15/2014 0500   CREATININE 0.9 06/25/2014 1410   CREATININE 0.80 05/15/2014 0500   CREATININE 0.83 03/27/2014 1102      Component Value Date/Time   CALCIUM 9.0 06/25/2014 1410   CALCIUM 8.6 05/15/2014 0500   ALKPHOS 112 06/25/2014 1410   ALKPHOS 125* 05/15/2014 0500   AST 20 06/25/2014 1410   AST 37 05/15/2014 0500   ALT 36 06/25/2014 1410   ALT 89* 05/15/2014 0500   BILITOT 0.45 06/25/2014 1410   BILITOT <0.2* 05/15/2014 0500       Lab Results  Component Value Date   WBC 1.6* 06/25/2014   HGB 10.5* 06/25/2014   HCT 32.2* 06/25/2014   MCV 94.3 06/25/2014   PLT 265 06/25/2014   NEUTROABS 0.8* 06/25/2014   ASSESSMENT & PLAN:  Breast cancer of lower-inner quadrant of right female breast Right breast invasive ductal carcinoma 1.6 cm  ER/PR positive HER-2 negative Ki-67 17% T1 C. N0 M0 stage IA status post lumpectomy, Oncotype DX recurrence score 29 intermediate risk 19% risk of recurrence with hormonal therapy alone  Today is cycle 3 day 8 of chemotherapy with Taxotere and Cytoxan that was started on 05/07/2014. Changes made since cycle 2: Reduction in dose, and elimination of Neulasta, decrease in steroids  Chemotoxicities: patient experienced the following toxicities to chemotherapy - thrush improved with Diflucan - Diffuse body aches: significantly better since we did not give Neulasta - Chemotherapy-induced neutropenia grade 3: Unable to give Neulasta because of bone pain. - Chemotherapy-induced anemia: Being monitored - Constipation improved with stool softeners - Fatigue grade 3 - Dehydration requiring IV fluids grade 3 (leading to hypotension) - Gastritis related to steroids: Will decrease the steroids with the next chemotherapy and prescribed her Nexium - Plan on giving her IV fluids today and tomorrow  Dose of chemotherapy will be reduced for the next cycle and she'll be the last In spite of not receiving Neulasta, her blood counts were adequate  Treatment plan: Return to clinic in 2 week for cycle 4, the last cycle of chemotherapy    No orders of the defined types were placed in this encounter.   The patient has a good understanding of the overall plan. she agrees with it. She will call with any problems that may develop before her next visit here.   Rulon Eisenmenger, MD 06/25/2014 3:05 PM

## 2014-06-26 ENCOUNTER — Ambulatory Visit (HOSPITAL_BASED_OUTPATIENT_CLINIC_OR_DEPARTMENT_OTHER): Payer: BC Managed Care – PPO

## 2014-06-26 DIAGNOSIS — C50311 Malignant neoplasm of lower-inner quadrant of right female breast: Secondary | ICD-10-CM

## 2014-06-26 DIAGNOSIS — E860 Dehydration: Secondary | ICD-10-CM

## 2014-06-26 MED ORDER — HEPARIN SOD (PORK) LOCK FLUSH 100 UNIT/ML IV SOLN
500.0000 [IU] | Freq: Once | INTRAVENOUS | Status: AC | PRN
  Administered 2014-06-26: 500 [IU]
  Filled 2014-06-26: qty 5

## 2014-06-26 MED ORDER — ONDANSETRON 8 MG/NS 50 ML IVPB
INTRAVENOUS | Status: AC
  Filled 2014-06-26: qty 8

## 2014-06-26 MED ORDER — SODIUM CHLORIDE 0.9 % IV SOLN
Freq: Once | INTRAVENOUS | Status: AC
  Administered 2014-06-26: 15:00:00 via INTRAVENOUS

## 2014-06-26 MED ORDER — ONDANSETRON 8 MG/50ML IVPB (CHCC)
8.0000 mg | Freq: Once | INTRAVENOUS | Status: AC
  Administered 2014-06-26: 8 mg via INTRAVENOUS

## 2014-06-26 MED ORDER — SODIUM CHLORIDE 0.9 % IJ SOLN
10.0000 mL | INTRAMUSCULAR | Status: DC | PRN
  Administered 2014-06-26: 10 mL
  Filled 2014-06-26: qty 10

## 2014-06-26 NOTE — Patient Instructions (Signed)
Dehydration, Adult Dehydration is when you lose more fluids from the body than you take in. Vital organs like the kidneys, brain, and heart cannot function without a proper amount of fluids and salt. Any loss of fluids from the body can cause dehydration.  CAUSES   Vomiting.  Diarrhea.  Excessive sweating.  Excessive urine output.  Fever. SYMPTOMS  Mild dehydration  Thirst.  Dry lips.  Slightly dry mouth. Moderate dehydration  Very dry mouth.  Sunken eyes.  Skin does not bounce back quickly when lightly pinched and released.  Dark urine and decreased urine production.  Decreased tear production.  Headache. Severe dehydration  Very dry mouth.  Extreme thirst.  Rapid, weak pulse (more than 100 beats per minute at rest).  Cold hands and feet.  Not able to sweat in spite of heat and temperature.  Rapid breathing.  Blue lips.  Confusion and lethargy.  Difficulty being awakened.  Minimal urine production.  No tears. DIAGNOSIS  Your caregiver will diagnose dehydration based on your symptoms and your exam. Blood and urine tests will help confirm the diagnosis. The diagnostic evaluation should also identify the cause of dehydration. TREATMENT  Treatment of mild or moderate dehydration can often be done at home by increasing the amount of fluids that you drink. It is best to drink small amounts of fluid more often. Drinking too much at one time can make vomiting worse. Refer to the home care instructions below. Severe dehydration needs to be treated at the hospital where you will probably be given intravenous (IV) fluids that contain water and electrolytes. HOME CARE INSTRUCTIONS   Ask your caregiver about specific rehydration instructions.  Drink enough fluids to keep your urine clear or pale yellow.  Drink small amounts frequently if you have nausea and vomiting.  Eat as you normally do.  Avoid:  Foods or drinks high in sugar.  Carbonated  drinks.  Juice.  Extremely hot or cold fluids.  Drinks with caffeine.  Fatty, greasy foods.  Alcohol.  Tobacco.  Overeating.  Gelatin desserts.  Wash your hands well to avoid spreading bacteria and viruses.  Only take over-the-counter or prescription medicines for pain, discomfort, or fever as directed by your caregiver.  Ask your caregiver if you should continue all prescribed and over-the-counter medicines.  Keep all follow-up appointments with your caregiver. SEEK MEDICAL CARE IF:  You have abdominal pain and it increases or stays in one area (localizes).  You have a rash, stiff neck, or severe headache.  You are irritable, sleepy, or difficult to awaken.  You are weak, dizzy, or extremely thirsty. SEEK IMMEDIATE MEDICAL CARE IF:   You are unable to keep fluids down or you get worse despite treatment.  You have frequent episodes of vomiting or diarrhea.  You have blood or green matter (bile) in your vomit.  You have blood in your stool or your stool looks black and tarry.  You have not urinated in 6 to 8 hours, or you have only urinated a small amount of very dark urine.  You have a fever.  You faint. MAKE SURE YOU:   Understand these instructions.  Will watch your condition.  Will get help right away if you are not doing well or get worse. Document Released: 06/21/2005 Document Revised: 09/13/2011 Document Reviewed: 02/08/2011 Exit[employer] Patient Information 2015 [employer]. This information is not intended to replace advice given to you by your health care provider. Make sure you discuss any questions you have with your health care   provider.  

## 2014-07-08 ENCOUNTER — Other Ambulatory Visit: Payer: Self-pay | Admitting: *Deleted

## 2014-07-08 ENCOUNTER — Encounter: Payer: Self-pay | Admitting: Radiation Oncology

## 2014-07-08 ENCOUNTER — Telehealth: Payer: Self-pay | Admitting: Hematology and Oncology

## 2014-07-08 DIAGNOSIS — C50311 Malignant neoplasm of lower-inner quadrant of right female breast: Secondary | ICD-10-CM

## 2014-07-08 NOTE — Progress Notes (Signed)
Location of Breast Cancer: Right Breast. Lower Inner Quadrant  Histology per Pathology Report:  04/02/14 Diagnosis 1. Lymph node, sentinel, biopsy, right - ONE LYMPH NODE, NEGATIVE FOR TUMOR (0/1). 2. Lymph node, sentinel, biopsy, right - ONE LYMPH NODE, NEGATIVE FOR TUMOR (0/1). 3. Lymph node, sentinel, biopsy, right - ONE LYMPH NODE, NEGATIVE FOR TUMOR (0/1). 4. Breast, lumpectomy, right - INVASIVE DUCTAL CARCINOMA, SEE COMMENT.- NEGATIVE FOR LYMPH VASCULAR INVASION.- INVASIVE TUMOR IS 0.6 CM FROM NEAREST MARGIN (POSTERIOR).- NEGATIVE FOR LYMPH VASCULAR INVASION.- PREVIOUS BIOPSY SITE IDENTIFIED. 5. Breast, excision, right inferior shave margin - BENIGN BREAST TISSUE, SEE COMMENT.- NEGATIVE FOR ATYPIA OR MALIGNANCY.- SURGICAL MARGINS, NEGATIVE FOR ATYPIA OR MALIGNANCY. 6. Breast, excision, right medial shave margin - BENIGN BREAST TISSUE, SEE COMMENT.- NEGATIVE FOR ATYPIA OR MALIGNANCY.- SURGICAL MARGINS, NEGATIVE FOR ATYPIA OR MALIGNANCY. 7. Breast, excision, right superior shave margin - BENIGN BREAST TISSUE, SEE COMMENT.- NEGATIVE FOR ATYPIA OR MALIGNANCY.- SURGICAL MARGIN, NEGATIVE FOR ATYPIA OR MALIGnancy  02/08/14 Diagnosis Breast, right, needle core biopsy, mass, 4 o'clock - INVASIVE DUCTAL CARCINOMA, SEE  Receptor Status: ER(100%), PR (94%), Her2-neu (No Amplification), Ki-67(17%)  Tami [name] had a routine mammogram in July 2015 that was done with 3-D detected abnormality in her right breast. Prior to that she is always felt a tenderness on the inferior aspect of the right breast but did not think much about it. She never felt any lumps or nodules. 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 The imaging did not reveal any lymph node involvement.  Past/Anticipated interventions by surgeon, if any: Dr.  Jackolyn Confer- Needle Core Biopsy of the right Breast  Past/Anticipated interventions by medical oncology, if any: Chemotherapy Dr. Lindi Adie. :   05/07/2014 -  Chemotherapy Taxotere Cytoxan adjuvant chemotherapy every 3 weeks x4 cycles (after cycle one hospitalization for pain related to Neulasta and severe gastritis due to steroids)  07/09/14 last chemo completed  Lymphedema issues, if any: None   Pain issues, if any: "generalized headache all the time from my medication" takes Ibuprofen prn  SAFETY ISSUES:  Prior radiation? NO  Pacemaker/ICD? NO  Possible current pregnancy? NO  Is the patient on methotrexate? NO  Current Complaints / other details:numbness in both heels to mid foot, slight swelling of ankles, takes Lasix daily Married, retired Oncologist  Her sister had breast cancer and died from it. She was in her 18s. She also has a niece with breast cancer. Age at menarche was 36, First live birth was at approximately 64 years of age. She took hormone replacement therapy for 5 or 6 years. She had a benign biopsy of the left breast many years ago. She had a hysterectomy approximately 30 years ago and so does not know when she had menopause.

## 2014-07-08 NOTE — Telephone Encounter (Signed)
s.w. pt and advised on on Jan appt...ok and aware

## 2014-07-09 ENCOUNTER — Ambulatory Visit (HOSPITAL_BASED_OUTPATIENT_CLINIC_OR_DEPARTMENT_OTHER): Payer: BC Managed Care – PPO | Admitting: Hematology and Oncology

## 2014-07-09 ENCOUNTER — Other Ambulatory Visit (HOSPITAL_BASED_OUTPATIENT_CLINIC_OR_DEPARTMENT_OTHER): Payer: BC Managed Care – PPO

## 2014-07-09 ENCOUNTER — Ambulatory Visit (HOSPITAL_BASED_OUTPATIENT_CLINIC_OR_DEPARTMENT_OTHER): Payer: BC Managed Care – PPO

## 2014-07-09 VITALS — BP 114/56 | HR 63 | Temp 98.7°F | Resp 18 | Ht 64.0 in | Wt 160.5 lb

## 2014-07-09 DIAGNOSIS — D6481 Anemia due to antineoplastic chemotherapy: Secondary | ICD-10-CM

## 2014-07-09 DIAGNOSIS — C50311 Malignant neoplasm of lower-inner quadrant of right female breast: Secondary | ICD-10-CM

## 2014-07-09 DIAGNOSIS — K2970 Gastritis, unspecified, without bleeding: Secondary | ICD-10-CM

## 2014-07-09 DIAGNOSIS — Z170 Estrogen receptor positive status [ER+]: Secondary | ICD-10-CM

## 2014-07-09 DIAGNOSIS — I959 Hypotension, unspecified: Secondary | ICD-10-CM

## 2014-07-09 DIAGNOSIS — Z5111 Encounter for antineoplastic chemotherapy: Secondary | ICD-10-CM

## 2014-07-09 DIAGNOSIS — E876 Hypokalemia: Secondary | ICD-10-CM

## 2014-07-09 DIAGNOSIS — D702 Other drug-induced agranulocytosis: Secondary | ICD-10-CM

## 2014-07-09 DIAGNOSIS — R5383 Other fatigue: Secondary | ICD-10-CM

## 2014-07-09 LAB — COMPREHENSIVE METABOLIC PANEL (CC13)
ALBUMIN: 3.6 g/dL (ref 3.5–5.0)
ALT: 22 U/L (ref 0–55)
ANION GAP: 7 meq/L (ref 3–11)
AST: 25 U/L (ref 5–34)
Alkaline Phosphatase: 85 U/L (ref 40–150)
BILIRUBIN TOTAL: 0.29 mg/dL (ref 0.20–1.20)
BUN: 9.2 mg/dL (ref 7.0–26.0)
CALCIUM: 9.4 mg/dL (ref 8.4–10.4)
CO2: 28 mEq/L (ref 22–29)
Chloride: 108 mEq/L (ref 98–109)
Creatinine: 0.9 mg/dL (ref 0.6–1.1)
EGFR: 80 mL/min/{1.73_m2} — ABNORMAL LOW (ref >90)
GLUCOSE: 102 mg/dL (ref 70–140)
Potassium: 4.2 mEq/L (ref 3.5–5.1)
SODIUM: 143 meq/L (ref 136–145)
Total Protein: 6.9 g/dL (ref 6.4–8.3)

## 2014-07-09 LAB — CBC WITH DIFFERENTIAL/PLATELET
BASO%: 1.4 % (ref 0.0–2.0)
Basophils Absolute: 0.1 10*3/uL (ref 0.0–0.1)
EOS%: 0.3 % (ref 0.0–7.0)
Eosinophils Absolute: 0 10*3/uL (ref 0.0–0.5)
HCT: 33.4 % — ABNORMAL LOW (ref 34.8–46.6)
HGB: 10.9 g/dL — ABNORMAL LOW (ref 11.6–15.9)
LYMPH%: 18.8 % (ref 14.0–49.7)
MCH: 31 pg (ref 25.1–34.0)
MCHC: 32.6 g/dL (ref 31.5–36.0)
MCV: 95.2 fL (ref 79.5–101.0)
MONO#: 0.7 10*3/uL (ref 0.1–0.9)
MONO%: 10.6 % (ref 0.0–14.0)
NEUT#: 4.5 10*3/uL (ref 1.5–6.5)
NEUT%: 68.9 % (ref 38.4–76.8)
PLATELETS: 281 10*3/uL (ref 145–400)
RBC: 3.51 10*6/uL — AB (ref 3.70–5.45)
RDW: 16.6 % — ABNORMAL HIGH (ref 11.2–14.5)
WBC: 6.5 10*3/uL (ref 3.9–10.3)
lymph#: 1.2 10*3/uL (ref 0.9–3.3)

## 2014-07-09 MED ORDER — DEXTROSE 5 % IV SOLN
55.0000 mg/m2 | Freq: Once | INTRAVENOUS | Status: AC
  Administered 2014-07-09: 100 mg via INTRAVENOUS
  Filled 2014-07-09: qty 10

## 2014-07-09 MED ORDER — FLUCONAZOLE 100 MG PO TABS: 100.0000 mg | ORAL_TABLET | Freq: Every day | ORAL | Status: DC

## 2014-07-09 MED ORDER — SODIUM CHLORIDE 0.9 % IV SOLN
400.0000 mg/m2 | Freq: Once | INTRAVENOUS | Status: AC
  Administered 2014-07-09: 700 mg via INTRAVENOUS
  Filled 2014-07-09: qty 35

## 2014-07-09 MED ORDER — HEPARIN SOD (PORK) LOCK FLUSH 100 UNIT/ML IV SOLN
500.0000 [IU] | Freq: Once | INTRAVENOUS | Status: AC | PRN
  Administered 2014-07-09: 500 [IU]
  Filled 2014-07-09: qty 5

## 2014-07-09 MED ORDER — ONDANSETRON 16 MG/50ML IVPB (CHCC)
16.0000 mg | Freq: Once | INTRAVENOUS | Status: AC
  Administered 2014-07-09: 16 mg via INTRAVENOUS

## 2014-07-09 MED ORDER — DEXAMETHASONE SODIUM PHOSPHATE 10 MG/ML IJ SOLN
INTRAMUSCULAR | Status: AC
  Filled 2014-07-09: qty 1

## 2014-07-09 MED ORDER — SODIUM CHLORIDE 0.9 % IJ SOLN
10.0000 mL | INTRAMUSCULAR | Status: DC | PRN
  Administered 2014-07-09: 10 mL
  Filled 2014-07-09: qty 10

## 2014-07-09 MED ORDER — SODIUM CHLORIDE 0.9 % IV SOLN
Freq: Once | INTRAVENOUS | Status: AC
  Administered 2014-07-09: 09:00:00 via INTRAVENOUS

## 2014-07-09 MED ORDER — ONDANSETRON 16 MG/50ML IVPB (CHCC)
INTRAVENOUS | Status: AC
  Filled 2014-07-09: qty 16

## 2014-07-09 MED ORDER — DEXAMETHASONE SODIUM PHOSPHATE 10 MG/ML IJ SOLN
10.0000 mg | Freq: Once | INTRAMUSCULAR | Status: AC
  Administered 2014-07-09: 10 mg via INTRAVENOUS

## 2014-07-09 NOTE — Assessment & Plan Note (Signed)
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 status post lumpectomy, Oncotype DX recurrence score 29 intermediate risk 19% risk of recurrence with hormonal therapy alone  Today is cycle 4 day 1 of chemotherapy with Taxotere and Cytoxan that was started on 05/07/2014. Changes made since cycle 2: Reduction in dose once after cycle 2 and second time after cycle 3, and elimination of Neulasta, decrease in steroids  Chemotoxicities: patient experienced the following toxicities to chemotherapy - thrush improved with Diflucan - Diffuse body aches: significantly better since we did not give Neulasta - Chemotherapy-induced neutropenia grade 3: Unable to give Neulasta because of bone pain. - Chemotherapy-induced anemia: Being monitored - Constipation improved with stool softeners - Fatigue grade 3 - Dehydration requiring IV fluids grade 3 (leading to hypotension) - Gastritis related to steroids: Will decrease the steroids with today's chemotherapy and she was previously prescribed Nexium  Dose of chemotherapy will be reduced for today's treatment which will be the last chemotherapy In spite of not receiving Neulasta, her blood counts were adequate. Return to clinic in 1 week because she usually requires IV fluid hydration

## 2014-07-09 NOTE — Patient Instructions (Signed)
[facility] Discharge Instructions for Patients Receiving Chemotherapy  Today you received the following chemotherapy agents Taxotere Cytoxan  To help prevent nausea and vomiting after your treatment, we encourage you to take your nausea medication as directed/prescribed   If you develop nausea and vomiting that is not controlled by your nausea medication, call the clinic.   BELOW ARE SYMPTOMS THAT SHOULD BE REPORTED IMMEDIATELY:  *FEVER GREATER THAN 100.5 F  *CHILLS WITH OR WITHOUT FEVER  NAUSEA AND VOMITING THAT IS NOT CONTROLLED WITH YOUR NAUSEA MEDICATION  *UNUSUAL SHORTNESS OF BREATH  *UNUSUAL BRUISING OR BLEEDING  TENDERNESS IN MOUTH AND THROAT WITH OR WITHOUT PRESENCE OF ULCERS  *URINARY PROBLEMS  *BOWEL PROBLEMS  UNUSUAL RASH Items with * indicate a potential emergency and should be followed up as soon as possible.  Feel free to call the clinic you have any questions or concerns. The clinic phone number is [phone].

## 2014-07-09 NOTE — Progress Notes (Signed)
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 No right axillary lymphadenopathy.    02/14/2014 Initial Diagnosis Breast cancer-right Er 100%, PR 94%, Ki 67: 17%; Her2 Neg ratio 1.74; T1bN0Mo (Clinical Stage 1A)   04/02/2014 Surgery Right breast lumpectomy: Invasive ductal carcinoma negative for lymph vascular invasion, 1.6 cm, grade 2, 3 SLN negative, reexcision margins benign, ER 100%, PR 94%, HER-2 negative Ki-67 70% T1 C. N0 M0 stage IA: Oncotype DX 29 (19%ROR)   05/07/2014 - 07/09/2014 Chemotherapy Taxotere Cytoxan adjuvant chemotherapy every 3 weeks x4 cycles (after cycle one hospitalization for pain related to Neulasta and severe gastritis due to steroids)    CHIEF COMPLIANT:  Cycle 4 Taxotere and Cytoxan , last chemotherapy cycle  INTERVAL HISTORY: Tami [name] is a  64 year old lady with above-mentioned history of right-sided breast cancer currently on adjuvant chemotherapy with Taxotere and Cytoxan. She appears to be doing very well since the last chemotherapy. Energy levels have improved her diet has increased she is swallowing better and eating better. Denies any nausea or vomiting. Fatigue from the effects of chemotherapy but her energy levels did improve.  REVIEW OF SYSTEMS:   Constitutional: Denies fevers, chills or abnormal weight loss ,  alopecia Eyes: Denies blurriness of vision Ears, nose, mouth, throat, and face: Denies mucositis or sore throat Respiratory: Denies cough, dyspnea or wheezes Cardiovascular: Denies palpitation, chest discomfort or lower extremity swelling Gastrointestinal:  Denies nausea, heartburn or change in bowel habits Skin: Denies abnormal skin rashes Lymphatics: Denies new lymphadenopathy or easy bruising Neurological:Denies numbness, tingling or new weaknesses Behavioral/Psych: Mood is stable, no new changes  Breast:  denies any pain or lumps or nodules in either breasts All other systems were reviewed with the patient and are negative.  I have reviewed the past medical history, past surgical history, social history and family history with the patient and they are unchanged from previous note.  ALLERGIES:  is allergic to latex; sulfonamide derivatives; clarithromycin; crestor; statins; and ciprofloxacin hcl.  MEDICATIONS:  Current Outpatient Prescriptions  Medication Sig Dispense Refill  . amitriptyline (ELAVIL) 25 MG tablet Take 25 mg by mouth at bedtime as needed for sleep. When patient has migraines    . aspirin EC 81 MG tablet Take 81 mg by mouth daily.    . celecoxib (CELEBREX) 200 MG capsule Take 200 mg by mouth 2 (two) times daily as needed for moderate pain.    [name] dexamethasone (DECADRON) 4 MG tablet Take 2 tablets (8 mg total) by mouth 2 (two) times daily. Start the day before Taxotere. Then again the day after chemo for 3 days. 30 tablet 1  . esomeprazole (NEXIUM) 40 MG capsule Take 1 capsule (40 mg total) by mouth daily at 12 noon. 30 capsule 2  . flecainide (TAMBOCOR) 100 MG tablet Take 0.5 tablets (50 mg total)  by mouth 2 (two) times daily. 90 tablet 3  . fluconazole (DIFLUCAN) 100 MG tablet Take 1 tablet (100 mg total) by mouth daily. 7 tablet 0  . gabapentin (NEURONTIN) 300 MG capsule Take 1 capsule (300 mg total) by mouth 3 (three) times daily. PATIENT NEEDS OFFICE VISIT FOR  ADDITIONAL REFILLS 90 capsule 0  . HYDROcodone-acetaminophen (NORCO) 5-325 MG per tablet Take 1 tablet by mouth every 6 (six) hours as needed for moderate pain. 30 tablet 0  . ibuprofen (ADVIL,MOTRIN) 200 MG tablet Take 200 mg by mouth every 6 (six) hours as needed for headache or moderate pain.    [name] lidocaine-prilocaine (EMLA) cream Apply 1 application topically as needed. 30 g 0  . LORazepam (ATIVAN) 0.5 MG tablet Take 1 tablet (0.5 mg total) by mouth every 6 (six) hours as needed (Nausea or vomiting). 30 tablet 0  . Meth-Hyo-M Bl-Na Phos-Ph Sal (URIBEL) 118 MG CAPS Take 118 mg by mouth 4 (four) times daily as needed (for bladder).   2  . metoprolol succinate (TOPROL-XL) 50 MG 24 hr tablet Take 1 tablet (50 mg total) by mouth daily. Take with or immediately following a meal. 90 tablet 3  . ondansetron (ZOFRAN) 4 MG tablet   0  . ondansetron (ZOFRAN) 8 MG tablet Take 1 tablet (8 mg total) by mouth 2 (two) times daily. Start the day after chemo for 3 days. Then take as needed for nausea or vomiting. 30 tablet 1  . PARoxetine (PAXIL) 20 MG tablet Take 1 tablet (20 mg total) by mouth daily. 90 tablet 2  . prochlorperazine (COMPAZINE) 10 MG tablet Take 1 tablet (10 mg total) by mouth every 6 (six) hours as needed (Nausea or vomiting). 30 tablet 1  . spironolactone (ALDACTONE) 25 MG tablet Take 1 tablet (25 mg total) by mouth daily. 30 tablet 3  . UNABLE TO FIND Apply 1 each topically as needed. 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 valACYclovir (VALTREX) 500 MG tablet Take 1 tablet (500 mg total) by mouth 2 (two) times daily as needed. 60 tablet 11   No current facility-administered medications for this visit.   Facility-Administered Medications Ordered in Other Visits  Medication Dose Route Frequency Provider Last Rate Last Dose  . cyclophosphamide (CYTOXAN) 700 mg in sodium chloride 0.9 % 250 mL chemo infusion  400 mg/m2 (Treatment Plan  Actual) Intravenous Once Rulon Eisenmenger, MD 570 mL/hr at 07/09/14 1122 700 mg at 07/09/14 1122  . heparin lock flush 100 unit/mL  500 Units Intracatheter Once PRN Rulon Eisenmenger, MD      . sodium chloride 0.9 % injection 10 mL  10 mL Intracatheter PRN Rulon Eisenmenger, MD        PHYSICAL EXAMINATION: ECOG PERFORMANCE STATUS: 2 - Symptomatic, <50% confined to bed  Filed Vitals:   07/09/14 0814  BP: 114/56  Pulse: 63  Temp: 98.7 F (37.1 C)  Resp: 18   Filed Weights   07/09/14 0814  Weight: 160 lb 8 oz (72.802 kg)    GENERAL:alert, no distress and comfortable SKIN: skin color, texture, turgor are normal, no rashes or significant lesions EYES: normal, Conjunctiva are pink and non-injected, sclera clear OROPHARYNX:no exudate, no erythema and lips, buccal mucosa, and tongue normal  NECK: supple, thyroid normal size, non-tender, without nodularity LYMPH:  no palpable lymphadenopathy in the cervical, axillary or inguinal LUNGS: clear to auscultation and percussion with normal breathing effort HEART: regular rate &  rhythm and no murmurs and no lower extremity edema ABDOMEN:abdomen soft, non-tender and normal bowel sounds Musculoskeletal:no cyanosis of digits and no clubbing  NEURO: alert & oriented x 3 with fluent speech, no focal motor/sensory deficits  LABORATORY DATA:  I have reviewed the data as listed   Chemistry      Component Value Date/Time   NA 143 07/09/2014 0901   NA 142 05/15/2014 0500   K 4.2 07/09/2014 0901   K 3.8 05/15/2014 0500   CL 104 05/15/2014 0500   CO2 28 07/09/2014 0901   CO2 26 05/15/2014 0500   BUN 9.2 07/09/2014 0901   BUN 4* 05/15/2014 0500   CREATININE 0.9 07/09/2014 0901   CREATININE 0.80 05/15/2014 0500   CREATININE 0.83 03/27/2014 1102      Component Value Date/Time   CALCIUM 9.4 07/09/2014 0901   CALCIUM 8.6 05/15/2014 0500   ALKPHOS 85 07/09/2014 0901   ALKPHOS 125* 05/15/2014 0500   AST 25 07/09/2014 0901   AST 37 05/15/2014 0500    ALT 22 07/09/2014 0901   ALT 89* 05/15/2014 0500   BILITOT 0.29 07/09/2014 0901   BILITOT <0.2* 05/15/2014 0500       Lab Results  Component Value Date   WBC 6.5 07/09/2014   HGB 10.9* 07/09/2014   HCT 33.4* 07/09/2014   MCV 95.2 07/09/2014   PLT 281 07/09/2014   NEUTROABS 4.5 07/09/2014   ASSESSMENT & PLAN:  Breast cancer of lower-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 status post lumpectomy, Oncotype DX recurrence score 29 intermediate risk 19% risk of recurrence with hormonal therapy alone  Today is cycle 4 day 1 of chemotherapy with Taxotere and Cytoxan that was started on 05/07/2014. Changes made since cycle 2: Reduction in dose once after cycle 2 and second time after cycle 3, and elimination of Neulasta, decrease in steroids  Chemotoxicities: patient experienced the following toxicities to chemotherapy - thrush improved with Diflucan - Diffuse body aches: significantly better since we did not give Neulasta - Chemotherapy-induced neutropenia grade 3: Unable to give Neulasta because of bone pain. - Chemotherapy-induced anemia: Being monitored - Constipation improved with stool softeners - Fatigue grade 3 - Dehydration requiring IV fluids grade 3 (leading to hypotension) - Gastritis related to steroids: Will decrease the steroids with today's chemotherapy and she was previously prescribed Nexium  Dose of chemotherapy will be reduced for today's treatment which will be the last chemotherapy In spite of not receiving Neulasta, her blood counts were adequate. Return to clinic in 1 week because she usually requires IV fluid hydration   Orders Placed This Encounter  Procedures  . CBC with Differential    Standing Status: Future     Number of Occurrences:      Standing Expiration Date: 07/09/2015  . Comprehensive metabolic panel (Cmet) - [facility]    Standing Status: Future      Number of Occurrences:      Standing Expiration Date: 07/09/2015   The patient has a good understanding of the overall plan. she agrees with it. She will call with any problems that may develop before her next visit here.   Rulon Eisenmenger, MD 07/09/2014 11:50 AM

## 2014-07-10 ENCOUNTER — Telehealth: Payer: Self-pay | Admitting: Hematology and Oncology

## 2014-07-10 ENCOUNTER — Ambulatory Visit
Admission: RE | Admit: 2014-07-10 | Discharge: 2014-07-10 | Disposition: A | Discharge status: Home / Self Care | Payer: BC Managed Care – PPO | Source: Ambulatory Visit | Attending: Radiation Oncology | Admitting: Radiation Oncology

## 2014-07-10 ENCOUNTER — Encounter: Payer: Self-pay | Admitting: Radiation Oncology

## 2014-07-10 VITALS — BP 130/63 | HR 72 | Temp 98.3°F | Resp 20 | Wt 164.0 lb

## 2014-07-10 DIAGNOSIS — C50311 Malignant neoplasm of lower-inner quadrant of right female breast: Secondary | ICD-10-CM | POA: Diagnosis not present

## 2014-07-10 NOTE — Progress Notes (Signed)
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 Breast cancer of lower-inner quadrant of right female breast 174.3 C50.311     Narrative:  The patient returns today for follow-up.    She underwent surgery in the form of right lumpectomy and sentinel lymph node biopsy on 04-02-14.  Pathology revealed 1.6cm tumor with negative nodes and negative margins.  She reports her genetic testing was negative.  She has completed 4 cycles of chemotherapy with Dr Lindi Adie in the form of Cytoxan and Taxotere.  She is doing relatively well.  She complains of some postoperative breast pain.     PATHOLOGY:      Results: HER-2/NEU BY CISH - NO AMPLIFICATION OF HER-2 DETECTED. RESULT RATIO OF HER2: CEP 17 SIGNALS 1.41 AVERAGE HER2 COPY NUMBER PER CELL 1.90 REFERENCE RANGE NEGATIVE HER2/Chr17 Ratio <2.0 and Average HER2 copy number <4.0 EQUIVOCAL HER2/Chr17 Ratio <2.0 and Average HER2 copy number 4.0 and <6.0 POSITIVE HER2/Chr17 Ratio >=2.0 and/or Average HER2 copy number >=6.0 Tami Cutter MD Pathologist, Electronic Signature ( Signed 04/10/2014) FINAL DIAGNOSIS Diagnosis 1. Lymph node, sentinel, biopsy, right - ONE LYMPH NODE, NEGATIVE FOR TUMOR (0/1). 2. Lymph node, sentinel, biopsy, right - ONE LYMPH NODE, NEGATIVE FOR TUMOR (0/1). 3. Lymph node, sentinel, biopsy, right - ONE LYMPH NODE, NEGATIVE FOR TUMOR (0/1). 4. Breast, lumpectomy, right - INVASIVE DUCTAL CARCINOMA, SEE COMMENT. - NEGATIVE FOR LYMPH VASCULAR INVASION. - INVASIVE TUMOR IS 0.6 CM FROM NEAREST MARGIN (POSTERIOR). - NEGATIVE FOR LYMPH VASCULAR INVASION. - PREVIOUS BIOPSY SITE  IDENTIFIED. 1 of 4 FINAL for Tami [name] [phone]) Diagnosis(continued) - SEE TUMOR SYNOPTIC TEMPLATE BELOW. 5. Breast, excision, right inferior shave margin - BENIGN BREAST TISSUE, SEE COMMENT. - NEGATIVE FOR ATYPIA OR MALIGNANCY. - SURGICAL MARGINS, NEGATIVE FOR ATYPIA OR MALIGNANCY. 6. Breast, excision, right medial shave margin - BENIGN BREAST TISSUE, SEE COMMENT. - NEGATIVE FOR ATYPIA OR MALIGNANCY. - SURGICAL MARGINS, NEGATIVE FOR ATYPIA OR MALIGNANCY. 7. Breast, excision, right superior shave margin - BENIGN BREAST TISSUE, SEE COMMENT. - NEGATIVE FOR ATYPIA OR MALIGNANCY. - SURGICAL MARGIN, NEGATIVE FOR ATYPIA OR MALIGNANCY. 8. Breast, excision, right lateral shave margin - BENIGN BREAST TISSUE, SEE COMMENT. - NEGATIVE FOR ATYPIA OR MALIGNANCY. - SURGICAL MARGIN, NEGATIVE FOR ATYPIA OR MALIGNANCY. Microscopic Comment 4. BREAST, INVASIVE TUMOR, WITH LYMPH NODE SAMPLING Specimen, including laterality and lymph node sampling (sentinel, non-sentinel): Right breast with sentinel lymph node sampling Procedure: Lumpectomy Histologic type: Ductal Grade: II of III Tubule formation: 3 Nuclear pleomorphism: 2 Mitotic: 1 Tumor size (gross measurement): 1.6 cm Margins: Invasive, distance to closest margin: 0.6 cm In-situ, distance to closest margin: N/A If margin positive, focally or broadly: N/A Lymphovascular invasion: Absent Ductal carcinoma in situ: Absent Grade: N/A Extensive intraductal component: N/A Lobular neoplasia: Absent Tumor focality: Unifocal Treatment effect: None If present, treatment effect in breast tissue, lymph nodes or both: N/A Extent of tumor: Skin: N/A Nipple: N/A Skeletal muscle: N/A Lymph nodes: Examined: 3 Sentinel 0 Non-sentinel 3 Total Lymph nodes with metastasis: 0 Isolated tumor cells (< 0.2 mm): N/A Micrometastasis: (> 0.2 mm and < 2.0 mm): N/A 2 of 4 FINAL  for Tami [name] ([id number]) Microscopic  Comment(continued) Macrometastasis: (> 2.0 mm): N/A Extracapsular extension: N/A Breast prognostic profile: Estrogen receptor: Not repeated, previous study demonstrated 100% positivity ([id number]) Progesterone receptor: Not repeated, previous biopsy demonstrated 94% positivity ([id number]) Her 2 neu: Repeated, previous study demonstrated no amplification (2.35) ([id number]) Ki-67: Not repeated, previous study demonstrated 17% proliferation rate ([id number]) Non-neoplastic breast: Previous biopsy site, fibrocystic change and calcifications. TNM: pT1c, pN0, PMX   ALLERGIES:  is allergic to latex; sulfonamide derivatives; clarithromycin; crestor; statins; and ciprofloxacin hcl.  Meds: Current Outpatient Prescriptions  Medication Sig Dispense Refill  . amitriptyline (ELAVIL) 25 MG tablet Take 25 mg by mouth at bedtime as needed for sleep. When patient has migraines    . aspirin EC 81 MG tablet Take 81 mg by mouth daily.    . celecoxib (CELEBREX) 200 MG capsule Take 200 mg by mouth 2 (two) times daily as needed for moderate pain.    [name] esomeprazole (NEXIUM) 40 MG capsule Take 1 capsule (40 mg total) by mouth daily at 12 noon. 30 capsule 2  . flecainide (TAMBOCOR) 100 MG tablet Take 0.5 tablets (50 mg total) by mouth 2 (two) times daily. 90 tablet 3  . fluconazole (DIFLUCAN) 100 MG tablet Take 1 tablet (100 mg total) by mouth daily. 7 tablet 0  . gabapentin (NEURONTIN) 300 MG capsule Take 1 capsule (300 mg total) by mouth 3 (three) times daily. PATIENT NEEDS OFFICE VISIT FOR ADDITIONAL REFILLS 90 capsule 0  . HYDROcodone-acetaminophen (NORCO) 5-325 MG per tablet Take 1 tablet by mouth every 6 (six) hours as needed for moderate pain. 30 tablet 0  . ibuprofen (ADVIL,MOTRIN) 200 MG tablet Take 200 mg by mouth every 6 (six) hours as needed for headache or moderate pain.    [name] lidocaine-prilocaine (EMLA) cream Apply 1 application topically as needed. 30 g 0  . LORazepam (ATIVAN) 0.5 MG tablet Take  1 tablet (0.5 mg total) by mouth every 6 (six) hours as needed (Nausea or vomiting). 30 tablet 0  . Meth-Hyo-M Bl-Na Phos-Ph Sal (URIBEL) 118 MG CAPS Take 118 mg by mouth 4 (four) times daily as needed (for bladder).   2  . metoprolol succinate (TOPROL-XL) 50 MG 24 hr tablet Take 1 tablet (50 mg total) by mouth daily. Take with or immediately following a meal. 90 tablet 3  . ondansetron (ZOFRAN) 4 MG tablet   0  . ondansetron (ZOFRAN) 8 MG tablet Take 1 tablet (8 mg total) by mouth 2 (two) times daily. Start the day after chemo for 3 days. Then take as needed for nausea or vomiting. 30 tablet 1  . PARoxetine (PAXIL) 20 MG tablet Take 1 tablet (20 mg total) by mouth daily. 90 tablet 2  . prochlorperazine (COMPAZINE) 10 MG tablet Take 1 tablet (10 mg total) by mouth every 6 (six) hours as needed (Nausea or vomiting). 30 tablet 1  . spironolactone (ALDACTONE) 25 MG tablet Take 1 tablet (25 mg total) by mouth daily. 30 tablet 3  . UNABLE TO FIND Apply 1 each topically as needed. 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 valACYclovir (VALTREX) 500 MG tablet Take 1 tablet (500 mg total) by mouth 2 (two) times daily as needed. 60 tablet 11  . dexamethasone (DECADRON) 4 MG tablet Take 2 tablets (8 mg total) by mouth 2 (two) times daily. Start the day before Taxotere. Then again the day after chemo for 3 days. (Patient not taking: Reported on 07/10/2014)  30 tablet 1   No current facility-administered medications for this encounter.    Physical Findings: The patient is in no acute distress. Patient is alert and oriented.  weight is 164 lb (74.39 kg). Her oral temperature is 98.3 F (36.8 C). Her blood pressure is 130/63 and her pulse is 72. Her respiration is 20. [name]    HEENT: no oral thrush NECK: No cervical or supraclavicular lymphadenopathy BREASTS: No lesions palpated in breasts or axillary regions bilaterally. Scars healed. Heart: RRR CHEST:  CTAB ABD: soft, non tender Ext: no C/C/E   Lab Findings: Lab Results  Component Value Date   WBC 6.5 07/09/2014   HGB 10.9* 07/09/2014   HCT 33.4* 07/09/2014   MCV 95.2 07/09/2014   PLT 281 07/09/2014      Radiographic Findings: No results found.  Impression/Plan:  Doing well, ready for radiation planning later this month.  We discussed adjuvant radiotherapy today.  I recommend adjuvant right whole breast RT in order to decrease risk of locoregional recurrence by about 2/3.  The risks, benefits and side effects of this treatment were discussed in detail.  She understands that radiotherapy is associated with skin irritation and fatigue in the acute setting. Late effects can include cosmetic changes and rare injury to internal organs.   She is enthusiastic about proceeding with treatment. A consent form has been signed and placed in her chart. Simulation to follow on 1/25.  I spent 25 minutes minutes face to face with the patient and more than 50% of that time was spent in counseling and/or coordination of care. _____________________________________   Eppie Gibson, MD

## 2014-07-10 NOTE — Telephone Encounter (Signed)
, °

## 2014-07-10 NOTE — Progress Notes (Signed)
Please see the Nurse Progress Note in the MD Initial Consult Encounter for this patient. 

## 2014-07-17 ENCOUNTER — Other Ambulatory Visit (HOSPITAL_BASED_OUTPATIENT_CLINIC_OR_DEPARTMENT_OTHER): Payer: BC Managed Care – PPO

## 2014-07-17 ENCOUNTER — Ambulatory Visit (HOSPITAL_BASED_OUTPATIENT_CLINIC_OR_DEPARTMENT_OTHER): Payer: BC Managed Care – PPO | Admitting: Hematology and Oncology

## 2014-07-17 VITALS — BP 111/64 | HR 68 | Temp 97.7°F | Resp 18 | Ht 64.0 in | Wt 158.4 lb

## 2014-07-17 DIAGNOSIS — C50311 Malignant neoplasm of lower-inner quadrant of right female breast: Secondary | ICD-10-CM

## 2014-07-17 DIAGNOSIS — D6481 Anemia due to antineoplastic chemotherapy: Secondary | ICD-10-CM

## 2014-07-17 DIAGNOSIS — D701 Agranulocytosis secondary to cancer chemotherapy: Secondary | ICD-10-CM

## 2014-07-17 LAB — CBC WITH DIFFERENTIAL/PLATELET
BASO%: 0.5 % (ref 0.0–2.0)
Basophils Absolute: 0 10*3/uL (ref 0.0–0.1)
EOS ABS: 0 10*3/uL (ref 0.0–0.5)
EOS%: 0.5 % (ref 0.0–7.0)
HCT: 29.9 % — ABNORMAL LOW (ref 34.8–46.6)
HEMOGLOBIN: 9.9 g/dL — AB (ref 11.6–15.9)
LYMPH#: 1 10*3/uL (ref 0.9–3.3)
LYMPH%: 45.6 % (ref 14.0–49.7)
MCH: 30.7 pg (ref 25.1–34.0)
MCHC: 33.1 g/dL (ref 31.5–36.0)
MCV: 92.9 fL (ref 79.5–101.0)
MONO#: 0.3 10*3/uL (ref 0.1–0.9)
MONO%: 15.2 % — ABNORMAL HIGH (ref 0.0–14.0)
NEUT#: 0.8 10*3/uL — ABNORMAL LOW (ref 1.5–6.5)
NEUT%: 38.2 % — ABNORMAL LOW (ref 38.4–76.8)
PLATELETS: 317 10*3/uL (ref 145–400)
RBC: 3.22 10*6/uL — ABNORMAL LOW (ref 3.70–5.45)
RDW: 15.6 % — ABNORMAL HIGH (ref 11.2–14.5)
WBC: 2.2 10*3/uL — AB (ref 3.9–10.3)

## 2014-07-17 LAB — COMPREHENSIVE METABOLIC PANEL (CC13)
ALT: 30 U/L (ref 0–55)
ANION GAP: 8 meq/L (ref 3–11)
AST: 24 U/L (ref 5–34)
Albumin: 3.6 g/dL (ref 3.5–5.0)
Alkaline Phosphatase: 104 U/L (ref 40–150)
BUN: 10.1 mg/dL (ref 7.0–26.0)
CALCIUM: 8.9 mg/dL (ref 8.4–10.4)
CO2: 25 meq/L (ref 22–29)
CREATININE: 0.9 mg/dL (ref 0.6–1.1)
Chloride: 105 mEq/L (ref 98–109)
EGFR: 83 mL/min/{1.73_m2} — ABNORMAL LOW (ref >90)
GLUCOSE: 91 mg/dL (ref 70–140)
Potassium: 4.1 mEq/L (ref 3.5–5.1)
SODIUM: 138 meq/L (ref 136–145)
Total Bilirubin: 0.25 mg/dL (ref 0.20–1.20)
Total Protein: 6.9 g/dL (ref 6.4–8.3)

## 2014-07-17 NOTE — Assessment & Plan Note (Addendum)
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 status post lumpectomy, Oncotype DX recurrence score 29 intermediate risk 19% risk of recurrence with hormonal therapy alone  Completed 4 cycles of chemotherapy with Taxotere and Cytoxan that was started on 05/07/2014. Changes made since cycle 2: Reduction in dose once after cycle 2 and second time after cycle 3, and elimination of Neulasta, decrease in steroids  Chemotoxicities: patient experienced the following toxicities to chemotherapy - thrush improved with Diflucan - Diffuse body aches: significantly better since we did not give Neulasta - Chemotherapy-induced neutropenia grade 3: Unable to give Neulasta because of bone pain. - Chemotherapy-induced anemia: Being monitored - Constipation improved with stool softeners - Fatigue grade 3 - Dehydration requiring IV fluids grade 3 (leading to hypotension) - Gastritis related to steroids:  Improved with Nexium and decreasing the dosage of steroids  Treatment plan: radiation oncology consultation for adjuvant radiation therapy this will be followed by antiestrogen therapy. Return to clinic in 2 months for follow-up.

## 2014-07-17 NOTE — Progress Notes (Signed)
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 No right axillary lymphadenopathy.    02/14/2014 Initial Diagnosis Breast cancer-right Er 100%, PR 94%, Ki 67: 17%; Her2 Neg ratio 1.74; T1bN0Mo (Clinical Stage 1A)   04/02/2014 Surgery Right breast lumpectomy: Invasive ductal carcinoma negative for lymph vascular invasion, 1.6 cm, grade 2, 3 SLN negative, reexcision margins benign, ER 100%, PR 94%, HER-2 negative Ki-67 70% T1 C. N0 M0 stage IA: Oncotype DX 29 (19%ROR)   05/07/2014 - 07/09/2014 Chemotherapy Taxotere Cytoxan adjuvant chemotherapy every 3 weeks x4 cycles (after cycle one hospitalization for pain related to Neulasta and severe gastritis due to steroids)    CHIEF COMPLIANT: Follow-up after completion of chemotherapy  INTERVAL HISTORY: Tami [name] is a 64 year old lady with above-mentioned history of right-sided breast cancer treated with lumpectomy by 4 cycles of adjuvant chemotherapy with Taxotere and Cytoxan. She had a lot of toxicities to treatment but finally she completed it and is able to feel a lot better about it. She usually gets dehydrated from not eating and drinking the week after chemotherapy but this time after the fourth cycle she is doing a lot better. She denies any nausea or vomiting. She is eating better although the taste is not back  there. She continues to have neuropathy symptoms.  REVIEW OF SYSTEMS:   Constitutional: Denies fevers, chills or abnormal weight loss Eyes: Denies blurriness of vision Ears, nose, mouth, throat, and face: Denies mucositis or sore throat Respiratory: Denies cough, dyspnea or wheezes Cardiovascular: Denies palpitation, chest discomfort or lower extremity swelling Gastrointestinal:  Denies nausea, heartburn or change in bowel habits Skin: Denies abnormal skin rashes Lymphatics: Denies new lymphadenopathy or easy bruising Neurological: Tingling and numbness in the hands and feet Behavioral/Psych: Mood is stable, no new changes  Breast:  denies any pain or lumps or nodules in either breasts All other systems were reviewed with the patient and are negative.  I have reviewed the past medical history, past surgical history, social history and family history with the patient and they are unchanged from previous note.  ALLERGIES:  is allergic to latex; sulfonamide derivatives; clarithromycin; crestor; statins; and ciprofloxacin hcl.  MEDICATIONS:  Current Outpatient Prescriptions  Medication Sig Dispense Refill  . amitriptyline (ELAVIL) 25 MG tablet Take 25 mg by mouth at bedtime as needed for sleep. When patient has migraines    . aspirin EC 81 MG tablet Take 81 mg by mouth daily.    . celecoxib (CELEBREX) 200 MG capsule Take 200 mg by mouth 2 (two) times daily as needed for moderate pain.    [name] esomeprazole (NEXIUM) 40 MG capsule Take 1 capsule (40 mg total) by mouth daily at 12 noon. 30 capsule 2  . flecainide (TAMBOCOR) 100 MG tablet Take 0.5 tablets (50 mg total) by mouth 2 (two) times daily. [place]  tablet 3  . fluconazole (DIFLUCAN) 100 MG tablet Take 1 tablet (100 mg total) by mouth daily. 7 tablet 0  . gabapentin (NEURONTIN) 300 MG capsule Take 1 capsule (300 mg total) by mouth 3 (three) times daily. PATIENT NEEDS OFFICE VISIT FOR ADDITIONAL REFILLS 90 capsule 0  . HYDROcodone-acetaminophen  (NORCO) 5-325 MG per tablet Take 1 tablet by mouth every 6 (six) hours as needed for moderate pain. 30 tablet 0  . ibuprofen (ADVIL,MOTRIN) 200 MG tablet Take 200 mg by mouth every 6 (six) hours as needed for headache or moderate pain.    [name] lidocaine-prilocaine (EMLA) cream Apply 1 application topically as needed. 30 g 0  . LORazepam (ATIVAN) 0.5 MG tablet Take 1 tablet (0.5 mg total) by mouth every 6 (six) hours as needed (Nausea or vomiting). 30 tablet 0  . Meth-Hyo-M Bl-Na Phos-Ph Sal (URIBEL) 118 MG CAPS Take 118 mg by mouth 4 (four) times daily as needed (for bladder).   2  . metoprolol succinate (TOPROL-XL) 50 MG 24 hr tablet Take 1 tablet (50 mg total) by mouth daily. Take with or immediately following a meal. 90 tablet 3  . ondansetron (ZOFRAN) 4 MG tablet   0  . ondansetron (ZOFRAN) 8 MG tablet Take 1 tablet (8 mg total) by mouth 2 (two) times daily. Start the day after chemo for 3 days. Then take as needed for nausea or vomiting. 30 tablet 1  . PARoxetine (PAXIL) 20 MG tablet Take 1 tablet (20 mg total) by mouth daily. 90 tablet 2  . prochlorperazine (COMPAZINE) 10 MG tablet Take 1 tablet (10 mg total) by mouth every 6 (six) hours as needed (Nausea or vomiting). 30 tablet 1  . spironolactone (ALDACTONE) 25 MG tablet Take 1 tablet (25 mg total) by mouth daily. 30 tablet 3  . UNABLE TO FIND Apply 1 each topically as needed. 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 valACYclovir (VALTREX) 500 MG tablet Take 1 tablet (500 mg total) by mouth 2 (two) times daily as needed. 60 tablet 11   No current facility-administered medications for this visit.    PHYSICAL EXAMINATION: ECOG PERFORMANCE STATUS: 2 - Symptomatic, <50% confined to bed  Filed Vitals:   07/17/14 1440  BP: 111/64  Pulse: 68  Temp: 97.7 F (36.5 C)  Resp: 18   Filed Weights   07/17/14 1440  Weight: 158 lb 6.4 oz (71.85 kg)    GENERAL:alert, no distress and  comfortable SKIN: skin color, texture, turgor are normal, no rashes or significant lesions EYES: normal, Conjunctiva are pink and non-injected, sclera clear OROPHARYNX:no exudate, no erythema and lips, buccal mucosa, and tongue normal  NECK: supple, thyroid normal size, non-tender, without nodularity LYMPH:  no palpable lymphadenopathy in the cervical, axillary or inguinal LUNGS: clear to auscultation and percussion with normal breathing effort HEART: regular rate & rhythm and no murmurs and no lower extremity edema ABDOMEN:abdomen soft, non-tender and normal bowel sounds Musculoskeletal:no cyanosis of digits and no clubbing  NEURO: Grade 2 peripheral neuropathy LABORATORY DATA:  I have reviewed the data as listed   Chemistry      Component Value Date/Time   NA 143 07/09/2014 0901   NA 142 05/15/2014 0500   K 4.2 07/09/2014 0901   K 3.8 05/15/2014 0500   CL 104 05/15/2014 0500   CO2 28 07/09/2014 0901   CO2 26 05/15/2014 0500   BUN 9.2 07/09/2014 0901   BUN 4* 05/15/2014 0500  CREATININE 0.9 07/09/2014 0901   CREATININE 0.80 05/15/2014 0500   CREATININE 0.83 03/27/2014 1102      Component Value Date/Time   CALCIUM 9.4 07/09/2014 0901   CALCIUM 8.6 05/15/2014 0500   ALKPHOS 85 07/09/2014 0901   ALKPHOS 125* 05/15/2014 0500   AST 25 07/09/2014 0901   AST 37 05/15/2014 0500   ALT 22 07/09/2014 0901   ALT 89* 05/15/2014 0500   BILITOT 0.29 07/09/2014 0901   BILITOT <0.2* 05/15/2014 0500       Lab Results  Component Value Date   WBC 2.2* 07/17/2014   HGB 9.9* 07/17/2014   HCT 29.9* 07/17/2014   MCV 92.9 07/17/2014   PLT 317 07/17/2014   NEUTROABS 0.8* 07/17/2014   ASSESSMENT & PLAN:  Breast cancer of lower-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 status post lumpectomy, Oncotype DX recurrence score 29 intermediate risk 19% risk of recurrence with hormonal therapy  alone  Completed 4 cycles of chemotherapy with Taxotere and Cytoxan that was started on 05/07/2014. Changes made since cycle 2: Reduction in dose once after cycle 2 and second time after cycle 3, and elimination of Neulasta, decrease in steroids  Chemotoxicities: patient experienced the following toxicities to chemotherapy - thrush improved with Diflucan - Diffuse body aches: significantly better since we did not give Neulasta - Chemotherapy-induced neutropenia grade 3: Unable to give Neulasta because of bone pain. - Chemotherapy-induced anemia: Being monitored - Constipation improved with stool softeners - Fatigue grade 3 - Dehydration requiring IV fluids grade 3 (leading to hypotension) - Gastritis related to steroids:  Improved with Nexium and decreasing the dosage of steroids  Treatment plan: radiation oncology consultation for adjuvant radiation therapy this will be followed by antiestrogen therapy. Return to clinic in 2 months for follow-up.    Orders Placed This Encounter  Procedures  . CBC with Differential    Standing Status: Future     Number of Occurrences:      Standing Expiration Date: 07/17/2015  . Comprehensive metabolic panel (Cmet) - [facility]    Standing Status: Future     Number of Occurrences:      Standing Expiration Date: 07/17/2015   The patient has a good understanding of the overall plan. she agrees with it. She will call with any problems that may develop before her next visit here.   Rulon Eisenmenger, MD

## 2014-07-18 ENCOUNTER — Telehealth: Payer: Self-pay | Admitting: Hematology and Oncology

## 2014-07-18 NOTE — Telephone Encounter (Signed)
s.w. pt and advised on March appt....pt ok and aware °

## 2014-07-23 ENCOUNTER — Encounter: Payer: Self-pay | Admitting: Hematology and Oncology

## 2014-07-23 ENCOUNTER — Encounter: Payer: Self-pay | Admitting: *Deleted

## 2014-07-23 NOTE — Progress Notes (Signed)
[facility] approved esomeprazole 40mg  from 06/23/14-07/23/15

## 2014-07-23 NOTE — Progress Notes (Signed)
RECEIVED A FAX FROM [facility] CONCERNING A PRIOR AUTHORIZATION FOR ESOMEPRAZOLE. THIS REQUEST WAS PLACED IN THE MANAGED CARE BIN.

## 2014-07-29 ENCOUNTER — Ambulatory Visit
Admission: RE | Admit: 2014-07-29 | Discharge: 2014-07-29 | Disposition: A | Discharge status: Home / Self Care | Payer: BC Managed Care – PPO | Source: Ambulatory Visit | Attending: Radiation Oncology | Admitting: Radiation Oncology

## 2014-07-29 DIAGNOSIS — C50311 Malignant neoplasm of lower-inner quadrant of right female breast: Secondary | ICD-10-CM

## 2014-07-29 NOTE — Progress Notes (Signed)
  Radiation Oncology         ([phone]) [phone] ________________________________  Name: Tami [name] MRN: [id number]  Date: 07/29/2014  DOB: Jan 10, 1951  SIMULATION AND TREATMENT PLANNING NOTE  outpatient  DIAGNOSIS:     ICD-9-CM ICD-10-CM   1. Breast cancer of lower-inner quadrant of right female breast 174.3 C50.311     NARRATIVE:  The patient was brought to the [place].  Identity was confirmed.  All relevant records and images related to the planned course of therapy were reviewed.  The patient freely provided informed written consent to proceed with treatment after reviewing the details related to the planned course of therapy. The consent form was witnessed and verified by the simulation staff.    Then, the patient was set-up in a stable reproducible  supine position for radiation therapy.  CT images were obtained.  Surface markings were placed.  The CT images were loaded into the planning software.    TREATMENT PLANNING NOTE: Treatment planning then occurred.  The radiation prescription was entered and confirmed.    A total of 3 medically necessary complex treatment devices were fabricated and supervised by me- tangential fields (2)* and a vaclock for her arm immobilization. I have requested : 3D Simulation  I have requested a DVH of the following structures: lungs, heart, lumpectomy cavity.    *more fields may be used for dose homogeneity   The patient will receive 46 Gy in 23 fractions to her right breast with at least 2 tangential fields, later followed by a [place] boost in 7 factions to her lumpectomy cavity.    -----------------------------------  Eppie Gibson, MD

## 2014-07-31 DIAGNOSIS — C50311 Malignant neoplasm of lower-inner quadrant of right female breast: Secondary | ICD-10-CM | POA: Diagnosis not present

## 2014-08-02 DIAGNOSIS — C50311 Malignant neoplasm of lower-inner quadrant of right female breast: Secondary | ICD-10-CM | POA: Diagnosis not present

## 2014-08-05 ENCOUNTER — Ambulatory Visit
Admission: RE | Admit: 2014-08-05 | Discharge: 2014-08-05 | Disposition: A | Discharge status: Home / Self Care | Payer: BC Managed Care – PPO | Source: Ambulatory Visit | Attending: Radiation Oncology | Admitting: Radiation Oncology

## 2014-08-05 DIAGNOSIS — C50311 Malignant neoplasm of lower-inner quadrant of right female breast: Secondary | ICD-10-CM | POA: Diagnosis not present

## 2014-08-06 ENCOUNTER — Ambulatory Visit
Admission: RE | Admit: 2014-08-06 | Discharge: 2014-08-06 | Disposition: A | Discharge status: Home / Self Care | Payer: BC Managed Care – PPO | Source: Ambulatory Visit | Attending: Radiation Oncology | Admitting: Radiation Oncology

## 2014-08-06 DIAGNOSIS — C50311 Malignant neoplasm of lower-inner quadrant of right female breast: Secondary | ICD-10-CM | POA: Diagnosis not present

## 2014-08-07 ENCOUNTER — Encounter: Payer: Self-pay | Admitting: Radiation Oncology

## 2014-08-07 ENCOUNTER — Ambulatory Visit
Admission: RE | Admit: 2014-08-07 | Discharge: 2014-08-07 | Disposition: A | Discharge status: Home / Self Care | Payer: BC Managed Care – PPO | Source: Ambulatory Visit | Attending: Radiation Oncology | Admitting: Radiation Oncology

## 2014-08-07 DIAGNOSIS — Z510 Encounter for antineoplastic radiation therapy: Secondary | ICD-10-CM | POA: Diagnosis present

## 2014-08-07 DIAGNOSIS — C50311 Malignant neoplasm of lower-inner quadrant of right female breast: Secondary | ICD-10-CM | POA: Diagnosis not present

## 2014-08-07 MED ORDER — ALRA NON-METALLIC DEODORANT (RAD-ONC)
1.0000 "application " | Freq: Once | TOPICAL | Status: AC
  Administered 2014-08-07: 1 via TOPICAL

## 2014-08-07 MED ORDER — RADIAPLEXRX EX GEL
Freq: Once | CUTANEOUS | Status: AC
  Administered 2014-08-07: 11:00:00 via TOPICAL

## 2014-08-07 NOTE — Progress Notes (Signed)
Patient education completed with patient and her spouse.  Gave her "Radiation and You" booklet with all pertinent information marked and discussed, re: fatigue, skin irritation/management, nutrition, pain. Gave her Radiaplex, Alra with instructions for proper use. Teach back method used, pt verbalized understanding.

## 2014-08-07 NOTE — Progress Notes (Signed)
   Weekly Management Note:  outpatient    ICD-9-CM ICD-10-CM   1. Breast cancer of lower-inner quadrant of right female breast 174.3 V69.794 non-metallic deodorant (ALRA) 1 application     hyaluronate sodium (RADIAPLEXRX) gel    Current Dose:  2 Gy  Projected Dose: 60 Gy   Narrative:  The patient presents for routine under treatment assessment.  CBCT/MVCT images/Port film x-rays were reviewed.  The chart was checked. No complaints  Physical Findings:  weight is 163 lb 8 oz (74.163 kg). Her oral temperature is 98.2 F (36.8 C). Her blood pressure is 111/59 and her pulse is 61. Her respiration is 12 and oxygen saturation is 100%.   Wt Readings from Last 3 Encounters:  07/17/14 158 lb 6.4 oz (71.85 kg)  07/09/14 160 lb 8 oz (72.802 kg)  06/25/14 155 lb 1.6 oz (70.353 kg)   NAD  Impression:  The patient is tolerating radiotherapy.  Plan:  Continue radiotherapy as planned.   ________________________________   Eppie Gibson, M.D.

## 2014-08-07 NOTE — Progress Notes (Signed)
She rates her pain as a 5 on a scale of 0-10. intermittent, burning, dull and aching over bilateral legs. Reports swelling over bilateral legs/ankles.  Pt complains of Loss of Sleep.  Pt right breast- warm dry and intact.  Pt denies edema over upper extremities.  BP 111/59 mmHg  Pulse 61  Temp(Src) 98.2 F (36.8 C) (Oral)  Resp 12  Wt 163 lb 8 oz (74.163 kg)  SpO2 100%  LMP 07/06/1985

## 2014-08-08 ENCOUNTER — Ambulatory Visit
Admission: RE | Admit: 2014-08-08 | Discharge: 2014-08-08 | Disposition: A | Discharge status: Home / Self Care | Payer: BC Managed Care – PPO | Source: Ambulatory Visit | Attending: Radiation Oncology | Admitting: Radiation Oncology

## 2014-08-08 DIAGNOSIS — C50311 Malignant neoplasm of lower-inner quadrant of right female breast: Secondary | ICD-10-CM | POA: Diagnosis not present

## 2014-08-09 ENCOUNTER — Ambulatory Visit
Admission: RE | Admit: 2014-08-09 | Discharge: 2014-08-09 | Disposition: A | Discharge status: Home / Self Care | Payer: BC Managed Care – PPO | Source: Ambulatory Visit | Attending: Radiation Oncology | Admitting: Radiation Oncology

## 2014-08-09 DIAGNOSIS — C50311 Malignant neoplasm of lower-inner quadrant of right female breast: Secondary | ICD-10-CM | POA: Diagnosis not present

## 2014-08-12 ENCOUNTER — Encounter: Payer: Self-pay | Admitting: Radiation Oncology

## 2014-08-12 ENCOUNTER — Ambulatory Visit
Admission: RE | Admit: 2014-08-12 | Discharge: 2014-08-12 | Disposition: A | Discharge status: Home / Self Care | Payer: BC Managed Care – PPO | Source: Ambulatory Visit | Attending: Radiation Oncology | Admitting: Radiation Oncology

## 2014-08-12 VITALS — BP 131/67 | HR 62 | Temp 97.7°F | Resp 20 | Wt 162.0 lb

## 2014-08-12 DIAGNOSIS — C50311 Malignant neoplasm of lower-inner quadrant of right female breast: Secondary | ICD-10-CM

## 2014-08-12 NOTE — Progress Notes (Signed)
Patient has occasional sharp shooting pains in right breast; she does not take any medication for this. She has some fatigue occasionally, no loss of appetite.  She is applying Radiaplex to right breast, no skin changes at this time per patient report.

## 2014-08-12 NOTE — Progress Notes (Signed)
   Weekly Management Note:  outpatient    ICD-9-CM ICD-10-CM   1. Breast cancer of lower-inner quadrant of right female breast 174.3 C50.311     Current Dose:  10 Gy  Projected Dose: 60 Gy   Narrative:  The patient presents for routine under treatment assessment.  CBCT/MVCT images/Port film x-rays were reviewed.  The chart was checked. No complaints  Physical Findings:  weight is 162 lb (73.483 kg). Her temperature is 97.7 F (36.5 C). Her blood pressure is 131/67 and her pulse is 62. Her respiration is 20.   Wt Readings from Last 3 Encounters:  07/17/14 158 lb 6.4 oz (71.85 kg)  07/09/14 160 lb 8 oz (72.802 kg)  06/25/14 155 lb 1.6 oz (70.353 kg)   NAD, minimal hyperpigmentation of right breast  Impression:  The patient is tolerating radiotherapy.  Plan:  Continue radiotherapy as planned.   ________________________________   Eppie Gibson, M.D.

## 2014-08-13 ENCOUNTER — Ambulatory Visit
Admission: RE | Admit: 2014-08-13 | Discharge: 2014-08-13 | Disposition: A | Discharge status: Home / Self Care | Payer: BC Managed Care – PPO | Source: Ambulatory Visit | Attending: Radiation Oncology | Admitting: Radiation Oncology

## 2014-08-13 DIAGNOSIS — C50311 Malignant neoplasm of lower-inner quadrant of right female breast: Secondary | ICD-10-CM | POA: Diagnosis not present

## 2014-08-14 ENCOUNTER — Ambulatory Visit
Admission: RE | Admit: 2014-08-14 | Discharge: 2014-08-14 | Disposition: A | Discharge status: Home / Self Care | Payer: BC Managed Care – PPO | Source: Ambulatory Visit | Attending: Radiation Oncology | Admitting: Radiation Oncology

## 2014-08-14 DIAGNOSIS — C50311 Malignant neoplasm of lower-inner quadrant of right female breast: Secondary | ICD-10-CM | POA: Diagnosis not present

## 2014-08-15 ENCOUNTER — Ambulatory Visit
Admission: RE | Admit: 2014-08-15 | Discharge: 2014-08-15 | Disposition: A | Discharge status: Home / Self Care | Payer: BC Managed Care – PPO | Source: Ambulatory Visit | Attending: Radiation Oncology | Admitting: Radiation Oncology

## 2014-08-15 DIAGNOSIS — C50311 Malignant neoplasm of lower-inner quadrant of right female breast: Secondary | ICD-10-CM | POA: Diagnosis not present

## 2014-08-16 ENCOUNTER — Ambulatory Visit
Admission: RE | Admit: 2014-08-16 | Discharge: 2014-08-16 | Disposition: A | Discharge status: Home / Self Care | Payer: BC Managed Care – PPO | Source: Ambulatory Visit | Attending: Radiation Oncology | Admitting: Radiation Oncology

## 2014-08-16 DIAGNOSIS — C50311 Malignant neoplasm of lower-inner quadrant of right female breast: Secondary | ICD-10-CM | POA: Diagnosis not present

## 2014-08-19 ENCOUNTER — Ambulatory Visit
Admission: RE | Admit: 2014-08-19 | Discharge: 2014-08-19 | Disposition: A | Discharge status: Home / Self Care | Payer: BC Managed Care – PPO | Source: Ambulatory Visit | Attending: Radiation Oncology | Admitting: Radiation Oncology

## 2014-08-19 ENCOUNTER — Encounter: Payer: Self-pay | Admitting: Radiation Oncology

## 2014-08-19 VITALS — BP 130/64 | HR 73 | Temp 97.8°F | Resp 20 | Wt 162.3 lb

## 2014-08-19 DIAGNOSIS — C50311 Malignant neoplasm of lower-inner quadrant of right female breast: Secondary | ICD-10-CM | POA: Diagnosis not present

## 2014-08-19 NOTE — Progress Notes (Signed)
Patient reports tenderness of nipple, soreness and pain in her right breast that comes and goes. She is not taking any medication for this pain. She is applying Radiaplex to right breast for some darkening of skin. She has slight fatigue, no loss of appetite.

## 2014-08-19 NOTE — Progress Notes (Signed)
   Weekly Management Note:  Outpatient    ICD-9-CM ICD-10-CM   1. Breast cancer of lower-inner quadrant of right female breast 174.3 C50.311     Current Dose:  20 Gy  Projected Dose: 60 Gy   Narrative:  The patient presents for routine under treatment assessment.  CBCT/MVCT images/Port film x-rays were reviewed.  The chart was checked. Patient reports tenderness of nipple, soreness and pain in her right breast that comes and goes ('shooting pains'). She is not taking any medication for this pain. She is applying Radiaplex to right breast for some darkening of skin. She has slight fatigue, no loss of appetite.   Physical Findings:  weight is 162 lb 4.8 oz (73.619 kg). Her temperature is 97.8 F (36.6 C). Her blood pressure is 130/64 and her pulse is 73. Her respiration is 20.   Wt Readings from Last 3 Encounters:  07/17/14 158 lb 6.4 oz (71.85 kg)  07/09/14 160 lb 8 oz (72.802 kg)  06/25/14 155 lb 1.6 oz (70.353 kg)   NAD, slight hyperpigmentation of right breast  Impression:  The patient is tolerating radiotherapy.  Plan:  Continue radiotherapy as planned. Reassurance given that symptoms are normal given past surgery and radiation  ________________________________   Eppie Gibson, M.D.

## 2014-08-20 ENCOUNTER — Ambulatory Visit
Admission: RE | Admit: 2014-08-20 | Discharge: 2014-08-20 | Disposition: A | Discharge status: Home / Self Care | Payer: BC Managed Care – PPO | Source: Ambulatory Visit | Attending: Radiation Oncology | Admitting: Radiation Oncology

## 2014-08-20 DIAGNOSIS — C50311 Malignant neoplasm of lower-inner quadrant of right female breast: Secondary | ICD-10-CM | POA: Diagnosis not present

## 2014-08-21 ENCOUNTER — Ambulatory Visit
Admission: RE | Admit: 2014-08-21 | Discharge: 2014-08-21 | Disposition: A | Discharge status: Home / Self Care | Payer: BC Managed Care – PPO | Source: Ambulatory Visit | Attending: Radiation Oncology | Admitting: Radiation Oncology

## 2014-08-21 DIAGNOSIS — C50311 Malignant neoplasm of lower-inner quadrant of right female breast: Secondary | ICD-10-CM | POA: Diagnosis not present

## 2014-08-22 ENCOUNTER — Ambulatory Visit
Admission: RE | Admit: 2014-08-22 | Discharge: 2014-08-22 | Disposition: A | Discharge status: Home / Self Care | Payer: BC Managed Care – PPO | Source: Ambulatory Visit | Attending: Radiation Oncology | Admitting: Radiation Oncology

## 2014-08-22 DIAGNOSIS — C50311 Malignant neoplasm of lower-inner quadrant of right female breast: Secondary | ICD-10-CM | POA: Diagnosis not present

## 2014-08-23 ENCOUNTER — Ambulatory Visit
Admission: RE | Admit: 2014-08-23 | Discharge: 2014-08-23 | Disposition: A | Discharge status: Home / Self Care | Payer: BC Managed Care – PPO | Source: Ambulatory Visit | Attending: Radiation Oncology | Admitting: Radiation Oncology

## 2014-08-23 DIAGNOSIS — C50311 Malignant neoplasm of lower-inner quadrant of right female breast: Secondary | ICD-10-CM | POA: Diagnosis not present

## 2014-08-26 ENCOUNTER — Ambulatory Visit: Payer: BC Managed Care – PPO | Admitting: Radiation Oncology

## 2014-08-26 ENCOUNTER — Encounter: Payer: Self-pay | Admitting: Radiation Oncology

## 2014-08-26 ENCOUNTER — Ambulatory Visit
Admission: RE | Admit: 2014-08-26 | Discharge: 2014-08-26 | Disposition: A | Discharge status: Home / Self Care | Payer: BC Managed Care – PPO | Source: Ambulatory Visit | Attending: Radiation Oncology | Admitting: Radiation Oncology

## 2014-08-26 VITALS — BP 132/70 | HR 68 | Temp 98.2°F | Resp 10 | Wt 159.8 lb

## 2014-08-26 DIAGNOSIS — C50311 Malignant neoplasm of lower-inner quadrant of right female breast: Secondary | ICD-10-CM

## 2014-08-26 NOTE — Addendum Note (Signed)
Encounter addended by: Andria Rhein, RN on: 08/26/2014 10:06 AM<BR>     Documentation filed: Notes Section

## 2014-08-26 NOTE — Progress Notes (Signed)
Weekly Management Note:  Site: Right breast Current Dose:  3000  cGy Projected Dose: 4600  cGy followed by a boost of 1400 cGy in 7 sessions  Narrative: The patient is seen today for routine under treatment assessment. CBCT/MVCT images/port films were reviewed. The chart was reviewed.   She is without complaints today except for slight "tenderness".  She uses Radioplex gel.  There is no significant pruritus.  Physical Examination:  Filed Vitals:   08/26/14 0932  BP: 132/70  Pulse: 68  Temp: 98.2 F (36.8 C)  Resp: 10  .  Weight: 159 lb 12.8 oz (72.485 kg).  There is mild hyperpigmentation the skin with no areas of desquamation.  Her skin looks better than expected.  Impression: Tolerating radiation therapy well.  Plan: Continue radiation therapy as planned.

## 2014-08-26 NOTE — Progress Notes (Addendum)
Patient reports moderate "tenderness" of her right breast. She also states she has itching of her skin in treatment area. She is applying Radiaplex for darkening of skin. She stated she prefers to try Cortisone cream 1% with Radiaplex before switching to Biafine. Gave her new tube. She is slightly fatigued on "some days". Denies loss of appetite. She also stated she felt she may have thrush, stating she had thrush "every time" she received chemo. No signs of thrush noted on patient's tongue, roof of mouth, cheeks.

## 2014-08-27 ENCOUNTER — Ambulatory Visit
Admission: RE | Admit: 2014-08-27 | Discharge: 2014-08-27 | Disposition: A | Discharge status: Home / Self Care | Payer: BC Managed Care – PPO | Source: Ambulatory Visit | Attending: Radiation Oncology | Admitting: Radiation Oncology

## 2014-08-27 DIAGNOSIS — C50311 Malignant neoplasm of lower-inner quadrant of right female breast: Secondary | ICD-10-CM | POA: Diagnosis not present

## 2014-08-28 ENCOUNTER — Encounter: Payer: Self-pay | Admitting: Radiation Oncology

## 2014-08-28 ENCOUNTER — Ambulatory Visit
Admission: RE | Admit: 2014-08-28 | Discharge: 2014-08-28 | Disposition: A | Discharge status: Home / Self Care | Payer: BC Managed Care – PPO | Source: Ambulatory Visit | Attending: Radiation Oncology | Admitting: Radiation Oncology

## 2014-08-28 DIAGNOSIS — C50311 Malignant neoplasm of lower-inner quadrant of right female breast: Secondary | ICD-10-CM | POA: Diagnosis not present

## 2014-08-29 ENCOUNTER — Ambulatory Visit
Admission: RE | Admit: 2014-08-29 | Discharge: 2014-08-29 | Disposition: A | Discharge status: Home / Self Care | Payer: BC Managed Care – PPO | Source: Ambulatory Visit | Attending: Radiation Oncology | Admitting: Radiation Oncology

## 2014-08-29 DIAGNOSIS — C50311 Malignant neoplasm of lower-inner quadrant of right female breast: Secondary | ICD-10-CM | POA: Diagnosis not present

## 2014-08-30 ENCOUNTER — Ambulatory Visit
Admission: RE | Admit: 2014-08-30 | Discharge: 2014-08-30 | Disposition: A | Discharge status: Home / Self Care | Payer: BC Managed Care – PPO | Source: Ambulatory Visit | Attending: Radiation Oncology | Admitting: Radiation Oncology

## 2014-08-30 DIAGNOSIS — C50311 Malignant neoplasm of lower-inner quadrant of right female breast: Secondary | ICD-10-CM | POA: Diagnosis not present

## 2014-09-01 ENCOUNTER — Ambulatory Visit: Payer: BC Managed Care – PPO

## 2014-09-02 ENCOUNTER — Encounter: Payer: Self-pay | Admitting: Radiation Oncology

## 2014-09-02 ENCOUNTER — Ambulatory Visit
Admission: RE | Admit: 2014-09-02 | Discharge: 2014-09-02 | Disposition: A | Discharge status: Home / Self Care | Payer: BC Managed Care – PPO | Source: Ambulatory Visit | Attending: Radiation Oncology | Admitting: Radiation Oncology

## 2014-09-02 ENCOUNTER — Ambulatory Visit: Payer: BC Managed Care – PPO

## 2014-09-02 VITALS — BP 115/55 | HR 72 | Temp 97.7°F | Resp 18

## 2014-09-02 DIAGNOSIS — C50311 Malignant neoplasm of lower-inner quadrant of right female breast: Secondary | ICD-10-CM | POA: Diagnosis not present

## 2014-09-02 NOTE — Progress Notes (Signed)
Patient denies pain other than occasional sharp pains "from surgery", she states. She denies fatigue, loss of appetite. She is applying Radiaplex to right breast treatment area for darkening/hyperpigmentation of skin; she denies peeling, flaking of skin. She applies Cortisone cream occasionally for itching.

## 2014-09-02 NOTE — Progress Notes (Signed)
   Weekly Management Note:  Outpatient    ICD-9-CM ICD-10-CM   1. Breast cancer of lower-inner quadrant of right female breast 174.3 C50.311     Current Dose:  40 Gy  Projected Dose: 60 Gy   Narrative:  The patient presents for routine under treatment assessment.  CBCT/MVCT images/Port film x-rays were reviewed.  The chart was checked. Doing well. Patient denies pain other than occasional sharp pains "from surgery", she states. She denies fatigue, loss of appetite. She is applying Radiaplex to right breast treatment area for darkening/hyperpigmentation of skin; she denies peeling, flaking of skin. She applies Cortisone cream occasionally for itching.  Physical Findings:  temperature is 97.7 F (36.5 C). Her blood pressure is 115/55 and her pulse is 72. Her respiration is 18.   Wt Readings from Last 3 Encounters:  07/17/14 158 lb 6.4 oz (71.85 kg)  07/09/14 160 lb 8 oz (72.802 kg)  06/25/14 155 lb 1.6 oz (70.353 kg)   Modest hyperpigmentation of right breast  Impression:  The patient is tolerating radiotherapy.  Plan:  Continue radiotherapy as planned.    ________________________________   Eppie Gibson, M.D.

## 2014-09-03 ENCOUNTER — Ambulatory Visit
Admission: RE | Admit: 2014-09-03 | Discharge: 2014-09-03 | Disposition: A | Discharge status: Home / Self Care | Payer: BC Managed Care – PPO | Source: Ambulatory Visit | Attending: Radiation Oncology | Admitting: Radiation Oncology

## 2014-09-03 DIAGNOSIS — C50311 Malignant neoplasm of lower-inner quadrant of right female breast: Secondary | ICD-10-CM | POA: Diagnosis not present

## 2014-09-04 ENCOUNTER — Ambulatory Visit
Admission: RE | Admit: 2014-09-04 | Discharge: 2014-09-04 | Disposition: A | Discharge status: Home / Self Care | Payer: BC Managed Care – PPO | Source: Ambulatory Visit | Attending: Radiation Oncology | Admitting: Radiation Oncology

## 2014-09-04 DIAGNOSIS — C50311 Malignant neoplasm of lower-inner quadrant of right female breast: Secondary | ICD-10-CM | POA: Diagnosis not present

## 2014-09-05 ENCOUNTER — Ambulatory Visit
Admission: RE | Admit: 2014-09-05 | Discharge: 2014-09-05 | Disposition: A | Discharge status: Home / Self Care | Payer: BC Managed Care – PPO | Source: Ambulatory Visit | Attending: Radiation Oncology | Admitting: Radiation Oncology

## 2014-09-05 ENCOUNTER — Encounter (INDEPENDENT_AMBULATORY_CARE_PROVIDER_SITE_OTHER): Payer: Self-pay | Admitting: General Surgery

## 2014-09-05 DIAGNOSIS — C50311 Malignant neoplasm of lower-inner quadrant of right female breast: Secondary | ICD-10-CM | POA: Diagnosis not present

## 2014-09-05 NOTE — Progress Notes (Signed)
Patient ID: Tami [name], female   DOB: 03-20-1951, 64 y.o.   MRN: [id number]   Tami [name] 09/05/2014 3:36 PM Location: [facility] Patient #: [id number] DOB: Oct 23, 1950 Married / Language: English / Race: Black or African American Female History of Present Illness [name] MD; 09/05/2014 3:51 PM) Patient words: breast f/u.  The patient is a 64 year old female    Note: Procedure: Right axillary lymphatic mapping. Right axillary sentinel lymph node biopsy (3 lymph nodes). Right partial mastectomy after wire localization  Date: 04/02/14  Pathology: T1cN0, ER/PR positive, Oncotype 29.  History: She is here for follow-up visit of her right breast cancer. She is currently undergoing radiation treatments. She has been tolerating her treatments well.  Exam: General- Is in NAD. Right breast-LIQ scar, radiation skin changes, no obvious masses  Right axilla-scar present  Left breast-no masses or suspicious skin changes.  Allergies Davy Pique Bynum, CMA; 09/05/2014 3:36 PM) Latex Clarithromycin *CHEMICALS* Crestor *ANTIHYPERLIPIDEMICS* Statins Depletion *DIETARY PRODUCTS/DIETARY MANAGEMENT PRODUCTS* Ciprofloxacin *CHEMICALS* Sulfonated Castor Oil *DERMATOLOGICALS*  Medication History (Sonya Bynum, CMA; 09/05/2014 3:36 PM) Amitriptyline HCl (25MG  Tablet, Oral) Active. Celecoxib (200MG  Capsule, Oral) Active. Dexamethasone (4MG  Tablet, Oral) Active. Flecainide Acetate (100MG  Tablet, Oral) Active. Gabapentin (300MG  Capsule, Oral) Active. Hydrocodone-Acetaminophen (5-325MG  Tablet, Oral) Active. Metoprolol Succinate ER (50MG  Tablet ER 24HR, Oral) Active. Ondansetron HCl (4MG  Tablet, Oral) Active. Prochlorperazine Maleate (10MG  Tablet, Oral) Active. Aspirin EC (81MG  Tablet DR, Oral) Active. B Complex (Oral) Active. Vitamin D (1000UNIT Capsule, Oral) Active. Magnesium Gluconate (250MG  Tablet, Oral) Active. Omega 3 (1000MG  Capsule, Oral)  Active. Valtrex (500MG  Tablet, Oral) Active. Paxil (20MG  Tablet, Oral) Active. Medications Reconciled    Vitals (Sonya Bynum CMA; 09/05/2014 3:36 PM) 09/05/2014 3:36 PM Weight: 153 lb Height: 64in Body Surface Area: 1.77 m Body Mass Index: 26.26 kg/m Temp.: 97.3F(Temporal)  Pulse: 75 (Regular)  BP: 124/76 (Sitting, Left Arm, Standard)     Assessment & Plan [name] MD; 09/05/2014 3:52 PM)  MALIGNANT NEOPLASM OF LOWER-INNER QUADRANT OF RIGHT FEMALE BREAST (174.3  C50.311) Impression: Status post breast conservation therapy. Currently undergoing radiation treatment.  Plan: Return visit in 3 months.  Current Plans Follow up in 3 months or as needed Free Text Instructions : discussed with patient and provided information.  Jackolyn Confer, MD

## 2014-09-06 ENCOUNTER — Ambulatory Visit
Admission: RE | Admit: 2014-09-06 | Discharge: 2014-09-06 | Disposition: A | Discharge status: Home / Self Care | Payer: BC Managed Care – PPO | Source: Ambulatory Visit | Attending: Radiation Oncology | Admitting: Radiation Oncology

## 2014-09-06 DIAGNOSIS — C50311 Malignant neoplasm of lower-inner quadrant of right female breast: Secondary | ICD-10-CM | POA: Diagnosis not present

## 2014-09-09 ENCOUNTER — Ambulatory Visit
Admission: RE | Admit: 2014-09-09 | Discharge: 2014-09-09 | Disposition: A | Discharge status: Home / Self Care | Payer: BC Managed Care – PPO | Source: Ambulatory Visit | Attending: Radiation Oncology | Admitting: Radiation Oncology

## 2014-09-09 VITALS — BP 118/64 | HR 68 | Temp 98.2°F | Resp 18 | Wt 156.6 lb

## 2014-09-09 DIAGNOSIS — C50311 Malignant neoplasm of lower-inner quadrant of right female breast: Secondary | ICD-10-CM | POA: Diagnosis not present

## 2014-09-09 NOTE — Progress Notes (Signed)
Patient reports moderate "tenderness, soreness" of right breast/axilla. She takes Tylenol with good relief. She is fatigued. She is applying Radiaplex to right breast for hyperpigmentation, small area desquamation in right axilla near her scar. Advised she apply antibiotic ointment to this area twice daily but after treatment. She reports difficulty sleeping, has no tried any OTC for this. Advised she may try Tylenol or Benadryl . She stated she would take Benadryl.

## 2014-09-09 NOTE — Progress Notes (Signed)
   Weekly Management Note:  Outpatient    ICD-9-CM ICD-10-CM   1. Breast cancer of lower-inner quadrant of right female breast 174.3 C50.311     Current Dose:  50 Gy  Projected Dose: 60 Gy   Narrative:  The patient presents for routine under treatment assessment.  CBCT/MVCT images/Port film x-rays were reviewed.  The chart was checked.  Patient reports moderate "tenderness, soreness" of right breast/axilla. She takes Tylenol with good relief. She is fatigued. She is applying Radiaplex to right breast for hyperpigmentation, small area desquamation in right axilla near her scar. Advised she apply antibiotic ointment to this area twice daily but after treatment. She reports difficulty sleeping, has no tried any OTC for this.   She stated she would take Benadryl.   Physical Findings:  weight is 156 lb 9.6 oz (71.033 kg). Her temperature is 98.2 F (36.8 C). Her blood pressure is 118/64 and her pulse is 68. Her respiration is 18.   Wt Readings from Last 3 Encounters:  09/09/14 156 lb 9.6 oz (71.033 kg)  07/17/14 158 lb 6.4 oz (71.85 kg)  07/09/14 160 lb 8 oz (72.802 kg)   Modest hyperpigmentation of right breast; scant desquamation  Impression:  The patient is tolerating radiotherapy.  Plan:  Continue radiotherapy as planned.  Triple antibiotic packets given  ________________________________   Eppie Gibson, M.D.

## 2014-09-10 ENCOUNTER — Ambulatory Visit
Admission: RE | Admit: 2014-09-10 | Discharge: 2014-09-10 | Disposition: A | Discharge status: Home / Self Care | Payer: BC Managed Care – PPO | Source: Ambulatory Visit | Attending: Radiation Oncology | Admitting: Radiation Oncology

## 2014-09-10 DIAGNOSIS — C50311 Malignant neoplasm of lower-inner quadrant of right female breast: Secondary | ICD-10-CM | POA: Diagnosis not present

## 2014-09-11 ENCOUNTER — Other Ambulatory Visit (HOSPITAL_BASED_OUTPATIENT_CLINIC_OR_DEPARTMENT_OTHER): Payer: BC Managed Care – PPO

## 2014-09-11 ENCOUNTER — Ambulatory Visit
Admission: RE | Admit: 2014-09-11 | Discharge: 2014-09-11 | Disposition: A | Discharge status: Home / Self Care | Payer: BC Managed Care – PPO | Source: Ambulatory Visit | Attending: Radiation Oncology | Admitting: Radiation Oncology

## 2014-09-11 DIAGNOSIS — D6481 Anemia due to antineoplastic chemotherapy: Secondary | ICD-10-CM

## 2014-09-11 DIAGNOSIS — D701 Agranulocytosis secondary to cancer chemotherapy: Secondary | ICD-10-CM | POA: Diagnosis not present

## 2014-09-11 DIAGNOSIS — C50311 Malignant neoplasm of lower-inner quadrant of right female breast: Secondary | ICD-10-CM

## 2014-09-11 LAB — CBC WITH DIFFERENTIAL/PLATELET
BASO%: 0.6 % (ref 0.0–2.0)
BASOS ABS: 0 10*3/uL (ref 0.0–0.1)
EOS%: 1.6 % (ref 0.0–7.0)
Eosinophils Absolute: 0.1 10*3/uL (ref 0.0–0.5)
HEMATOCRIT: 36.7 % (ref 34.8–46.6)
HEMOGLOBIN: 12.3 g/dL (ref 11.6–15.9)
LYMPH%: 19.9 % (ref 14.0–49.7)
MCH: 31.1 pg (ref 25.1–34.0)
MCHC: 33.5 g/dL (ref 31.5–36.0)
MCV: 92.7 fL (ref 79.5–101.0)
MONO#: 0.5 10*3/uL (ref 0.1–0.9)
MONO%: 15.1 % — AB (ref 0.0–14.0)
NEUT#: 2 10*3/uL (ref 1.5–6.5)
NEUT%: 62.8 % (ref 38.4–76.8)
PLATELETS: 184 10*3/uL (ref 145–400)
RBC: 3.96 10*6/uL (ref 3.70–5.45)
RDW: 14.7 % — ABNORMAL HIGH (ref 11.2–14.5)
WBC: 3.2 10*3/uL — ABNORMAL LOW (ref 3.9–10.3)
lymph#: 0.6 10*3/uL — ABNORMAL LOW (ref 0.9–3.3)

## 2014-09-11 LAB — COMPREHENSIVE METABOLIC PANEL (CC13)
ALK PHOS: 76 U/L (ref 40–150)
ALT: 23 U/L (ref 0–55)
AST: 25 U/L (ref 5–34)
Albumin: 3.8 g/dL (ref 3.5–5.0)
Anion Gap: 9 mEq/L (ref 3–11)
BUN: 8 mg/dL (ref 7.0–26.0)
CHLORIDE: 108 meq/L (ref 98–109)
CO2: 24 meq/L (ref 22–29)
Calcium: 9.3 mg/dL (ref 8.4–10.4)
Creatinine: 0.8 mg/dL (ref 0.6–1.1)
Glucose: 89 mg/dl (ref 70–140)
Potassium: 4 mEq/L (ref 3.5–5.1)
SODIUM: 141 meq/L (ref 136–145)
TOTAL PROTEIN: 7.1 g/dL (ref 6.4–8.3)
Total Bilirubin: 0.28 mg/dL (ref 0.20–1.20)

## 2014-09-12 ENCOUNTER — Other Ambulatory Visit: Payer: BC Managed Care – PPO

## 2014-09-12 ENCOUNTER — Ambulatory Visit
Admission: RE | Admit: 2014-09-12 | Discharge: 2014-09-12 | Disposition: A | Discharge status: Home / Self Care | Payer: BC Managed Care – PPO | Source: Ambulatory Visit | Attending: Radiation Oncology | Admitting: Radiation Oncology

## 2014-09-12 ENCOUNTER — Telehealth: Payer: Self-pay | Admitting: Hematology and Oncology

## 2014-09-12 ENCOUNTER — Ambulatory Visit (HOSPITAL_BASED_OUTPATIENT_CLINIC_OR_DEPARTMENT_OTHER): Payer: BC Managed Care – PPO | Admitting: Hematology and Oncology

## 2014-09-12 VITALS — BP 121/65 | HR 64 | Temp 97.9°F | Resp 18 | Ht 64.0 in | Wt 156.7 lb

## 2014-09-12 DIAGNOSIS — C50311 Malignant neoplasm of lower-inner quadrant of right female breast: Secondary | ICD-10-CM | POA: Diagnosis not present

## 2014-09-12 DIAGNOSIS — Z170 Estrogen receptor positive status [ER+]: Secondary | ICD-10-CM | POA: Diagnosis not present

## 2014-09-12 MED ORDER — ANASTROZOLE 1 MG PO TABS: 1.0000 mg | ORAL_TABLET | Freq: Every day | ORAL | Status: DC

## 2014-09-12 NOTE — Progress Notes (Signed)
Patient Care Team: Kurt Lauenstein, MD as PCP - General (Family Medicine) Richard Weintraub, MD (Cardiology) Sigmund Tannenbaum, MD as Attending Physician (Urology)  DIAGNOSIS: Breast cancer of lower-inner quadrant of right female breast   Staging form: Breast, AJCC 7th Edition     Clinical: Stage IA (T1b, N0, cM0) - Signed by Vinay K Gudena, MD on 02/26/2014     Pathologic: Stage IA (T1c, N0, cM0) - Signed by Vinay Gudena, MD on 09/12/2014   SUMMARY OF ONCOLOGIC HISTORY:   Breast cancer of lower-inner quadrant of right female breast   01/18/2014 Mammogram U/S Breast: 7 x 7 x 9 mm irregular taller than wide hypoechoic mass with a peripheral echogenic rim in the right breast 4 o'clock position 7 cm from the nipple. No right axillary lymphadenopathy.    02/14/2014 Initial Diagnosis Breast cancer-right Er 100%, PR 94%, Ki 67: 17%; Her2 Neg ratio 1.74; T1bN0Mo (Clinical Stage 1A)   04/02/2014 Surgery Right breast lumpectomy: Invasive ductal carcinoma negative for lymph vascular invasion, 1.6 cm, grade 2, 3 SLN negative, reexcision margins benign, ER 100%, PR 94%, HER-2 negative Ki-67 70% T1 C. N0 M0 stage IA: Oncotype DX 29 (19%ROR)   05/07/2014 - 07/09/2014 Chemotherapy Taxotere Cytoxan adjuvant chemotherapy every 3 weeks x4 cycles (after cycle one hospitalization for pain related to Neulasta and severe gastritis due to steroids)   08/07/2014 -  Radiation Therapy Adjuvant radiation therapy    CHIEF COMPLIANT: Radiation completed 09/16/2014  INTERVAL HISTORY: Tami [name] is a 64-year-old lady with above-mentioned history of right breast cancer treated with lumpectomy and received adjuvant chemotherapy for 4 cycles and is now on adjuvant radiation therapy. She has a bit of sunburn and skin breakdown happening right now but she is to worsen last couple of more radiation treatments left. She is here to talk about the next step in adjuvant treatment plan. She has some hair folliculitis on her scalp. Her  hair is starting to come back.  REVIEW OF SYSTEMS:   Constitutional: Denies fevers, chills or abnormal weight loss Eyes: Denies blurriness of vision Ears, nose, mouth, throat, and face: Denies mucositis or sore throat Respiratory: Denies cough, dyspnea or wheezes Cardiovascular: Denies palpitation, chest discomfort or lower extremity swelling Gastrointestinal:  Denies nausea, heartburn or change in bowel habits Skin: Denies abnormal skin rashes Lymphatics: Denies new lymphadenopathy or easy bruising Neurological:Denies numbness, tingling or new weaknesses Behavioral/Psych: Mood is stable, no new changes  Breast:  Sunburn-like rash underneath the breasts. All other systems were reviewed with the patient and are negative.  I have reviewed the past medical history, past surgical history, social history and family history with the patient and they are unchanged from previous note.  ALLERGIES:  is allergic to latex; sulfonamide derivatives; clarithromycin; crestor; statins; and ciprofloxacin hcl.  MEDICATIONS:  Current Outpatient Prescriptions  Medication Sig Dispense Refill  . amitriptyline (ELAVIL) 25 MG tablet Take 25 mg by mouth at bedtime as needed for sleep. When patient has migraines    . anastrozole (ARIMIDEX) 1 MG tablet Take 1 tablet (1 mg total) by mouth daily. 90 tablet 3  . aspirin EC 81 MG tablet Take 81 mg by mouth daily.    . celecoxib (CELEBREX) 200 MG capsule Take 200 mg by mouth 2 (two) times daily as needed for moderate pain.    . esomeprazole (NEXIUM) 40 MG capsule Take 1 capsule (40 mg total) by mouth daily at 12 noon. 30 capsule 2  . flecainide (TAMBOCOR) 100 MG tablet Take 0.5 tablets (50   mg total) by mouth 2 (two) times daily. 90 tablet 3  . gabapentin (NEURONTIN) 300 MG capsule Take 1 capsule (300 mg total) by mouth 3 (three) times daily. PATIENT NEEDS OFFICE VISIT FOR ADDITIONAL REFILLS 90 capsule 0  . hyaluronate sodium (RADIAPLEXRX) GEL Apply 1 application  topically 2 (two) times daily.    [name] HYDROcodone-acetaminophen (NORCO) 5-325 MG per tablet Take 1 tablet by mouth every 6 (six) hours as needed for moderate pain. 30 tablet 0  . ibuprofen (ADVIL,MOTRIN) 200 MG tablet Take 200 mg by mouth every 6 (six) hours as needed for headache or moderate pain.    [name] lidocaine-prilocaine (EMLA) cream Apply 1 application topically as needed. 30 g 0  . LORazepam (ATIVAN) 0.5 MG tablet Take 1 tablet (0.5 mg total) by mouth every 6 (six) hours as needed (Nausea or vomiting). 30 tablet 0  . Meth-Hyo-M Bl-Na Phos-Ph Sal (URIBEL) 118 MG CAPS Take 118 mg by mouth 4 (four) times daily as needed (for bladder).   2  . metoprolol succinate (TOPROL-XL) 50 MG 24 hr tablet Take 1 tablet (50 mg total) by mouth daily. Take with or immediately following a meal. 90 tablet 3  . ondansetron (ZOFRAN) 4 MG tablet   0  . ondansetron (ZOFRAN) 8 MG tablet Take 1 tablet (8 mg total) by mouth 2 (two) times daily. Start the day after chemo for 3 days. Then take as needed for nausea or vomiting. 30 tablet 1  . PARoxetine (PAXIL) 20 MG tablet Take 1 tablet (20 mg total) by mouth daily. 90 tablet 2  . prochlorperazine (COMPAZINE) 10 MG tablet Take 1 tablet (10 mg total) by mouth every 6 (six) hours as needed (Nausea or vomiting). 30 tablet 1  . spironolactone (ALDACTONE) 25 MG tablet Take 1 tablet (25 mg total) by mouth daily. 30 tablet 3  . UNABLE TO FIND Apply 1 each topically as needed. 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 valACYclovir (VALTREX) 500 MG tablet Take 1 tablet (500 mg total) by mouth 2 (two) times daily as needed. 60 tablet 11   No current facility-administered medications for this visit.    PHYSICAL EXAMINATION: ECOG PERFORMANCE STATUS: 1 - Symptomatic but completely ambulatory  Filed Vitals:   09/12/14 0944  BP: 121/65  Pulse: 64  Temp: 97.9 F (36.6 C)  Resp: 18   Filed Weights   09/12/14 0944  Weight:  156 lb 11.2 oz (71.079 kg)    GENERAL:alert, no distress and comfortable SKIN: skin color, texture, turgor are normal, no rashes or significant lesions EYES: normal, Conjunctiva are pink and non-injected, sclera clear OROPHARYNX:no exudate, no erythema and lips, buccal mucosa, and tongue normal  NECK: supple, thyroid normal size, non-tender, without nodularity LYMPH:  no palpable lymphadenopathy in the cervical, axillary or inguinal LUNGS: clear to auscultation and percussion with normal breathing effort HEART: regular rate & rhythm and no murmurs and no lower extremity edema ABDOMEN:abdomen soft, non-tender and normal bowel sounds Musculoskeletal:no cyanosis of digits and no clubbing  NEURO: alert & oriented x 3 with fluent speech, no focal motor/sensory deficits  LABORATORY DATA:  I have reviewed the data as listed   Chemistry      Component Value Date/Time   NA 141 09/11/2014 1000   NA 142 05/15/2014 0500   K 4.0 09/11/2014 1000   K 3.8 05/15/2014 0500   CL 104 05/15/2014 0500   CO2 24 09/11/2014 1000   CO2 26  05/15/2014 0500   BUN 8.0 09/11/2014 1000   BUN 4* 05/15/2014 0500   CREATININE 0.8 09/11/2014 1000   CREATININE 0.80 05/15/2014 0500   CREATININE 0.83 03/27/2014 1102      Component Value Date/Time   CALCIUM 9.3 09/11/2014 1000   CALCIUM 8.6 05/15/2014 0500   ALKPHOS 76 09/11/2014 1000   ALKPHOS 125* 05/15/2014 0500   AST 25 09/11/2014 1000   AST 37 05/15/2014 0500   ALT 23 09/11/2014 1000   ALT 89* 05/15/2014 0500   BILITOT 0.28 09/11/2014 1000   BILITOT <0.2* 05/15/2014 0500       Lab Results  Component Value Date   WBC 3.2* 09/11/2014   HGB 12.3 09/11/2014   HCT 36.7 09/11/2014   MCV 92.7 09/11/2014   PLT 184 09/11/2014   NEUTROABS 2.0 09/11/2014    ASSESSMENT & PLAN:  Breast cancer of lower-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 status  post lumpectomy, Oncotype DX recurrence score 29 intermediate risk 19% risk of recurrence with hormonal therapy alone  Chemotherapy summary: Taxotere and Cytoxan 4 started 05/07/2014 completed 07/09/2014 Long-term chemotherapy toxicities: Neuropathy Radiation therapy: Started 08/07/2014  Recommendation: Antiestrogen therapy with anastrozole 1 mg daily to start after radiation is complete  Anastrozole counseling:We discussed the risks and benefits of anti-estrogen therapy with aromatase inhibitors. These include but not limited to insomnia, hot flashes, mood changes, vaginal dryness, bone density loss, and weight gain. Although rare, serious side effects including endometrial cancer, risk of blood clots were also discussed. We strongly believe that the benefits far outweigh the risks. Patient understands these risks and consented to starting treatment. Planned treatment duration is 5 years.  I provided her with a prescription for anastrozole that will start 2 weeks after the radiation is complete. I will see her back in 3 months to assess toxicity to treatment.   Orders Placed This Encounter  Procedures  . CBC with Differential    Standing Status: Future     Number of Occurrences:      Standing Expiration Date: 09/12/2015  . Comprehensive metabolic panel (Cmet) - [facility]    Standing Status: Future     Number of Occurrences:      Standing Expiration Date: 09/12/2015   The patient has a good understanding of the overall plan. she agrees with it. She will call with any problems that may develop before her next visit here.   Gudena, Vinay K, MD    

## 2014-09-12 NOTE — Telephone Encounter (Signed)
appts made and avs printed for pt  Tami [name] °

## 2014-09-12 NOTE — Assessment & Plan Note (Signed)
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 status post lumpectomy, Oncotype DX recurrence score 29 intermediate risk 19% risk of recurrence with hormonal therapy alone  Chemotherapy summary: Taxotere and Cytoxan 4 started 05/07/2014 completed 07/09/2014 Long-term chemotherapy toxicities: Neuropathy Radiation therapy: Started 08/07/2014  Recommendation: Antiestrogen therapy with anastrozole 1 mg daily to start after radiation is complete  Anastrozole counseling:We discussed the risks and benefits of anti-estrogen therapy with aromatase inhibitors. These include but not limited to insomnia, hot flashes, mood changes, vaginal dryness, bone density loss, and weight gain. Although rare, serious side effects including endometrial cancer, risk of blood clots were also discussed. We strongly believe that the benefits far outweigh the risks. Patient understands these risks and consented to starting treatment. Planned treatment duration is 5 years.  I provided her with a prescription for anastrozole that will start 2 weeks after the radiation is complete. I will see her back in 3 months to assess toxicity to treatment.

## 2014-09-12 NOTE — Addendum Note (Signed)
Addended by: Prentiss Bells on: 09/12/2014 11:24 AM   Modules accepted: Medications

## 2014-09-13 ENCOUNTER — Ambulatory Visit
Admission: RE | Admit: 2014-09-13 | Discharge: 2014-09-13 | Disposition: A | Discharge status: Home / Self Care | Payer: BC Managed Care – PPO | Source: Ambulatory Visit | Attending: Radiation Oncology | Admitting: Radiation Oncology

## 2014-09-13 DIAGNOSIS — C50311 Malignant neoplasm of lower-inner quadrant of right female breast: Secondary | ICD-10-CM | POA: Diagnosis not present

## 2014-09-16 ENCOUNTER — Ambulatory Visit
Admission: RE | Admit: 2014-09-16 | Discharge: 2014-09-16 | Disposition: A | Discharge status: Home / Self Care | Payer: BC Managed Care – PPO | Source: Ambulatory Visit | Attending: Radiation Oncology | Admitting: Radiation Oncology

## 2014-09-16 ENCOUNTER — Encounter: Payer: Self-pay | Admitting: Radiation Oncology

## 2014-09-16 VITALS — BP 111/63 | HR 57 | Temp 98.0°F | Resp 18 | Wt 156.2 lb

## 2014-09-16 DIAGNOSIS — C50311 Malignant neoplasm of lower-inner quadrant of right female breast: Secondary | ICD-10-CM

## 2014-09-16 NOTE — Progress Notes (Signed)
   Weekly Management Note:  Outpatient    ICD-9-CM ICD-10-CM   1. Breast cancer of lower-inner quadrant of right female breast 174.3 C50.311     Current Dose:  60 Gy  Projected Dose: 60 Gy   Narrative:  The patient presents for routine under treatment assessment.  CBCT/MVCT images/Port film x-rays were reviewed.  The chart was checked. Patient denies pain, loss of appetite. He energy level fluctuates each day. She is applying lotion to right breast for hyperpigmentation, some desquamatio in right axilla and under breast which has improved since last week. She is applying antibiotic ointment to these areas. She completed treatment today.  Physical Findings:  weight is 156 lb 3.2 oz (70.852 kg). Her temperature is 98 F (36.7 C). Her blood pressure is 111/63 and her pulse is 57. Her respiration is 18.   Wt Readings from Last 3 Encounters:  09/16/14 156 lb 3.2 oz (70.852 kg)  09/12/14 156 lb 11.2 oz (71.079 kg)  09/09/14 156 lb 9.6 oz (71.033 kg)   Modest hyperpigmentation of right breast; dry desquamation at inframammary fold   Impression:  The patient has tolerated radiotherapy.  Plan:  F/u in 1 mo. [facility] flyer given  ________________________________   Eppie Gibson, M.D.

## 2014-09-16 NOTE — Progress Notes (Signed)
Patient denies pain, loss of appetite. He energy level fluctuates each day. She is applying lotion to right breast for hyperpigmentation, some desquamatio in right axilla and under breast which has improved since last week. She is applying antibiotic ointment to these areas. She completed treatment today. Advised she continue with lotion until complete then apply lotion with vitamin E. Gave her 1 month FU card.

## 2014-09-17 ENCOUNTER — Ambulatory Visit: Payer: BC Managed Care – PPO

## 2014-09-18 ENCOUNTER — Ambulatory Visit: Payer: BC Managed Care – PPO

## 2014-09-19 ENCOUNTER — Ambulatory Visit: Payer: BC Managed Care – PPO

## 2014-09-23 NOTE — Progress Notes (Signed)
  Radiation Oncology         [phone] ________________________________  Name: Tami [name] MRN: [id number]  Date: 09/16/2014  DOB: July 18, 1950  End of Treatment Note  Diagnosis:   Stage I pT1c pN0 cM0 Right Breast LIQ Invasive Ductal Carcinoma, ER+ / PR+ / Her2neg, Grade II  Indication for treatment:  curative       Radiation treatment dates:   08/06/2014-09/16/2014  Site/dose:   1) Right breast, 46 Gy in 23 fractions 2) Right breast boost, 14 Gy in 7 fractions  Beams/energy:   1) 3D conformal tangents / 6MV 2) Electrons, en face / 15 MeV  Narrative: The patient tolerated radiation treatment relatively well.     Plan: The patient has completed radiation treatment. The patient will return to radiation oncology clinic for routine followup in one month. I advised them to call or return sooner if they have any questions or concerns related to their recovery or treatment.  -----------------------------------  Eppie Gibson, MD

## 2014-09-26 ENCOUNTER — Encounter: Payer: Self-pay | Admitting: Hematology and Oncology

## 2014-09-26 NOTE — Progress Notes (Signed)
Patient no longer on Neulasta. I will get card deactivated.

## 2014-10-07 ENCOUNTER — Encounter: Payer: Self-pay | Admitting: Cardiovascular Disease

## 2014-10-07 ENCOUNTER — Ambulatory Visit (INDEPENDENT_AMBULATORY_CARE_PROVIDER_SITE_OTHER): Payer: BC Managed Care – PPO | Admitting: Cardiovascular Disease

## 2014-10-07 VITALS — BP 116/76 | HR 63 | Ht 64.0 in | Wt 156.8 lb

## 2014-10-07 DIAGNOSIS — I10 Essential (primary) hypertension: Secondary | ICD-10-CM | POA: Diagnosis not present

## 2014-10-07 DIAGNOSIS — R5383 Other fatigue: Secondary | ICD-10-CM

## 2014-10-07 DIAGNOSIS — I480 Paroxysmal atrial fibrillation: Secondary | ICD-10-CM

## 2014-10-07 DIAGNOSIS — E785 Hyperlipidemia, unspecified: Secondary | ICD-10-CM | POA: Diagnosis not present

## 2014-10-07 DIAGNOSIS — I471 Supraventricular tachycardia: Secondary | ICD-10-CM

## 2014-10-07 DIAGNOSIS — Z79899 Other long term (current) drug therapy: Secondary | ICD-10-CM

## 2014-10-07 DIAGNOSIS — C50311 Malignant neoplasm of lower-inner quadrant of right female breast: Secondary | ICD-10-CM

## 2014-10-07 NOTE — Progress Notes (Signed)
Patient ID: Tami [name], female   DOB: 17-Apr-1951, 64 y.o.   MRN: [id number]     HPI: [name] is a 64 year old African American female who is a former patient of Dr. Rollene [name]. She established cardiology care with me in November 2014.  I last saw her prior to her lumpectomy.  She presents for a 2-monthcardiology evaluation.  [name] a history of palpitations, paroxysmal supraventricular tachycardia as well as paroxysmal atrial fibrillation which have been detected on remote monitoring. She has been treated with low-dose flecainide 50 mg twice a day in addition to metoprolol succinate 50 mg daily. She also has a history of hyperlipidemia in the past has had LDLs in the 145 range but is intolerant to several statins including Crestor. She also states she developed some myalgias secondary to Zetia. When she was last seen by Dr. WRollene Farehe had written her a prescription for livalo 1 mg but she never took this.  She also has continued problems with low back and hip discomfort and has some sciatica issues. She is followed by Dr. RMayer [name] She also has undergone MRIs of her back.   An echo Doppler study in February 2012 showed normal systolic and diastolic function. She did not have significant valvular pathology. A nuclear perfusion study in 2010 which was normal.  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  I saw her prior to undergoing her lumpectomy and prior to radiation and chemotherapy.  A subsequent 2-D echo Doppler study on 03/28/2014 showed an ejection fraction at 60-65% and was normal.  She tolerated lumpectomy well and completed chemotherapy with Taxotere tear and Cytoxan started on 05/07/2014 and completed on 07/09/2014.  Radiation therapy was started on 08/07/2014 and is now  complete.  She is on antiestrogen therapy with anastrozole 1 mg daily, which commenced after radiation is complete.  She denies recent episodes of chest pain.  She feels her palpitations are controlled but very rarely does note an occasional palpitation.  These have improved following completion of chemotherapy.    Past Medical History  Diagnosis Date  . Heart palpitations   . Osteoporosis   . Asthma   . SVT (supraventricular tachycardia)   . Numbness of arm     Right Arm  . Irregular heartbeat   . Anxiety   . Arthritis   . Atrial fibrillation   . Heartburn   . Hyperlipidemia   . Pure hypercholesterolemia   . Family history of malignant neoplasm of breast   . Anemia     as a child  . Dysrhythmia     hx AF  . Malignant neoplasm of breast (female), unspecified site   . Invasive ductal carcinoma of right breast 02/08/14    Lower Inner Quadrant    Past Surgical History  Procedure Laterality Date  . Abdominal hysterectomy  1987  . Cystoscopy  04/25/2012    Pt may be a candidate for research anti-incontinence procedure protocol  . Doppler echocardiography  08/21/2010    Ejecion Fraction =>55% All chambers are normal in size and function No significant change from 2010.  . Dipyridamole myoview  05/27/2009    The post stress myocardial perfusion images show a normal pattern of perfusion in all regions.The post stress ejection fraction is 83 %. Normal  myocardial perfusion imaging This  is a low risk scan.  [name] Upper arterial dopplers  06/02/2011    Right Side PPG Demonstrates siginificantly diminshed waveforms with the Adson's maneuver which may suggest Thoracic Outlet Syndrone. This is a abnormal thoracic outlet arterial evaluation.  . Lower venous dopplers  04/11/2009    Normal bilateral lower ext venous duplex  . 30 day monitor  04/10/2009 to 05/23/2009    No End Of Summary Report Atrial Fib,Narrow Complex Tachycardia,Sinus Rhythm   . Breast surgery      Left  . Breast lumpectomy  with needle localization and axillary sentinel lymph node bx Right 04/02/2014    Procedure: Right Axillary Lymphatic Mapping; Right axillary Sentinal Node Biopsy; Right Partial Mastectomy after wire localization;  Surgeon: Tami Confer, MD;  Location: [facility];  Service: General;  Laterality: Right;  . Portacath placement N/A 04/26/2014    Procedure: ULTRASOUND GUIDED PORT A CATHE INSERTION;  Surgeon: Tami Confer, MD;  Location: [facility];  Service: General;  Laterality: N/A;    Allergies  Allergen Reactions  . Latex Swelling and Rash    Skin swelling   . Sulfonamide Derivatives Anaphylaxis, Shortness Of Breath, Nausea Only and Other (See Comments)    Sweating, headache   . Clarithromycin Nausea Only and Other (See Comments)    Headache   . Crestor [Rosuvastatin] Other (See Comments)    Muscle aches, pain, weakness in lower extremities  . Statins Other (See Comments)    Muscle soreness and weakness w/ Rosuvastatin and Zetia.  . Ciprofloxacin Hcl Nausea Only, Palpitations and Other (See Comments)    Headache     Current Outpatient Prescriptions  Medication Sig Dispense Refill  . amitriptyline (ELAVIL) 25 MG tablet Take 25 mg by mouth at bedtime as needed for sleep. When patient has migraines    . anastrozole (ARIMIDEX) 1 MG tablet Take 1 tablet (1 mg total) by mouth daily. 90 tablet 3  . aspirin EC 81 MG tablet Take 81 mg by mouth daily.    . flecainide (TAMBOCOR) 100 MG tablet Take 0.5 tablets (50 mg total) by mouth 2 (two) times daily. 90 tablet 3  . gabapentin (NEURONTIN) 300 MG capsule Take 1 capsule (300 mg total) by mouth 3 (three) times daily. PATIENT NEEDS OFFICE VISIT FOR ADDITIONAL REFILLS 90 capsule 0  . hyaluronate sodium (RADIAPLEXRX) GEL Apply 1 application topically 2 (two) times daily.    [name] HYDROcodone-acetaminophen (NORCO) 5-325 MG per tablet Take 1 tablet by mouth every 6 (six) hours as needed for moderate pain. 30 tablet 0  . ibuprofen (ADVIL,MOTRIN) 200 MG tablet  Take 200 mg by mouth every 6 (six) hours as needed for headache or moderate pain.    [name] lidocaine-prilocaine (EMLA) cream Apply 1 application topically as needed. 30 g 0  . LORazepam (ATIVAN) 0.5 MG tablet Take 1 tablet (0.5 mg total) by mouth every 6 (six) hours as needed (Nausea or vomiting). 30 tablet 0  . Meth-Hyo-M Bl-Na Phos-Ph Sal (URIBEL) 118 MG CAPS Take 118 mg by mouth 4 (four) times daily as needed (for bladder).   2  . metoprolol succinate (TOPROL-XL) 50 MG 24 hr tablet Take 1 tablet (50 mg total) by mouth daily. Take with or immediately following a meal. 90 tablet 3  . PARoxetine (PAXIL) 20 MG tablet Take 1 tablet (20 mg total) by mouth daily. 90 tablet 2  . prochlorperazine (COMPAZINE) 10 MG tablet Take 1 tablet (10 mg total) by mouth every 6 (six) hours as needed (Nausea or vomiting). [place]  tablet 1  . spironolactone (ALDACTONE) 25 MG tablet Take 1 tablet (25 mg total) by mouth daily. 30 tablet 3  . UNABLE TO FIND Apply 1 each topically as needed. 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 valACYclovir (VALTREX) 500 MG tablet Take 1 tablet (500 mg total) by mouth 2 (two) times daily as needed. 60 tablet 11   No current facility-administered medications for this visit.    Social history is notable in that she is married and has 2 children and 3 grandchildren. 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 There is no tobacco or alcohol use.   Family History  Problem Relation Age of Onset  . Cancer Mother     skin cancer  . Diabetes Sister     Borderline  . Hypertension Sister   . Heart attack Sister   . Breast cancer Sister 79    maternal half-sister; deceased 47  . Diabetes Brother     Borderline; maternal half-brother  . Cancer Maternal Aunt     stomach and throat; heavy smoker and drinker; deceased 40  . Cancer Cousin 72    unk. primary; daughter of mat aunt with stomach/throat  ca  . Cancer Other     breast/ovarian; daughter of mat cousin with unk. primary at 28; pt's 1st cousin-once-removed    ROS General: Negative; No fevers, chills, or night sweats;  HEENT: Negative; No changes in vision or hearing, sinus congestion, difficulty swallowing Pulmonary: Negative; No cough, wheezing, shortness of breath, hemoptysis Cardiovascular: Negative; No chest pain, presyncope, syncope, palpitations GI: Negative; No nausea, vomiting, diarrhea, or abdominal pain GU: Negative; No dysuria, hematuria, or difficulty voiding Musculoskeletal: Negative; no myalgias, joint pain, or weakness Hematologic/Oncology: As above, positive for invasive ductal carcinoma of the right breast .  Status post chemotherapy and radiation treatment following lumpectomy Endocrine: Negative; no heat/cold intolerance; no diabetes Neuro: Negative; no changes in balance, headaches Skin: Negative; No rashes or skin lesions Psychiatric: Negative; No behavioral problems, depression Sleep: Negative; No snoring, daytime sleepiness, hypersomnolence, bruxism, restless legs, hypnogognic hallucinations, no cataplexy Other comprehensive 14 point system review is negative.   PE BP 116/76 mmHg  Pulse 63  Ht 5' 4"  (1.626 m)  Wt 156 lb 12.8 oz (71.124 kg)  BMI 26.90 kg/m2  LMP 07/06/1985 General: Alert, oriented, no distress.  Skin: normal turgor, no rashes HEENT: Normocephalic, atraumatic. Pupils round and reactive; sclera anicteric; Fundi no hemorrhages or exudates arteriolar narrowing Nose without nasal septal hypertrophy Mouth/Parynx benign; Mallinpatti scale 3 Neck: No JVD, no carotid bruits with normal carotid upstroke Lungs: clear to ausculatation and percussion; no wheezing or rales Chest wall: Nontender to palpation Heart: RRR, s1 s2 normal 1/6 systolic murmur, no ectopy; no S3 or S4 gallop.  No rubs thrills or heaves. Abdomen: soft, nontender; no hepatosplenomehaly, BS+; abdominal aorta nontender and  not dilated by palpation. Back: No CVA tenderness Pulses 2+ Extremities: no clubbinbg cyanosis or edema, Homan's sign negative  Neurologic: grossly nonfocal Psychologic: Normal mood and affect  ECG (independently read by me): Normal sinus rhythm at 63 bpm.  First-degree AV block with a PR interval at 254 ms.  QTc interval 49 ms.  Nonspecific T changes.  September 2015 ECG sinus rhythm with first-degree AV block.  Nonspecific T changes V1 through V3.  Prior ECG: Sinus rhythm at 57 beats per minute, with first degree AV block with PR interval 252 ms. QTc  interval is normal at 389 ms. Nonspecific T changes.  LABS:  BMET  BMP Latest Ref Rng 09/11/2014 07/17/2014 07/09/2014  Glucose 70 - 140 mg/dl 89 91 102  BUN 7.0 - 26.0 mg/dL 8.0 10.1 9.2  Creatinine 0.6 - 1.1 mg/dL 0.8 0.9 0.9  Sodium 136 - 145 mEq/L 141 138 143  Potassium 3.5 - 5.1 mEq/L 4.0 4.1 4.2  Chloride 96 - 112 mEq/L - - -  CO2 22 - 29 mEq/L 24 25 28   Calcium 8.4 - 10.4 mg/dL 9.3 8.9 9.4      Hepatic Function Panel   Hepatic Function Latest Ref Rng 09/11/2014 07/17/2014 07/09/2014  Total Protein 6.4 - 8.3 g/dL 7.1 6.9 6.9  Albumin 3.5 - 5.0 g/dL 3.8 3.6 3.6  AST 5 - 34 U/L 25 24 25   ALT 0 - 55 U/L 23 30 22   Alk Phosphatase 40 - 150 U/L 76 104 85  Total Bilirubin 0.20 - 1.20 mg/dL 0.28 0.25 0.29     CBC  CBC Latest Ref Rng 09/11/2014 07/17/2014 07/09/2014  WBC 3.9 - 10.3 10e3/uL 3.2(L) 2.2(L) 6.5  Hemoglobin 11.6 - 15.9 g/dL 12.3 9.9(L) 10.9(L)  Hematocrit 34.8 - 46.6 % 36.7 29.9(L) 33.4(L)  Platelets 145 - 400 10e3/uL 184 317 281     BNP No results found for: PROBNP  Lipid Panel     Component Value Date/Time   CHOL 228* 03/27/2014 1102   CHOL 237* 06/22/2013 1405   TRIG 76 03/27/2014 1102   TRIG 103 06/22/2013 1405   HDL 73 03/27/2014 1102   HDL 74 06/22/2013 1405   CHOLHDL 3.1 03/27/2014 1102   VLDL 15 03/27/2014 1102   LDLCALC 140* 03/27/2014 1102   LDLCALC 142* 06/22/2013 1405     RADIOLOGY: No  results found.   ASSESSMENT AND PLAN: Ms. Prest is a 64 year old African-American female who has a history of PSVT/PAF which has been  controlled with low-dose flecainide 50 twice a day combined with beta blocker therapy. She denies any recent chest pain. She does have nonspecific T changes on her ECG.  Her echo Doppler study done immediately prior to therapy was entirely normal with an ejection fraction of 60-65%.  Ms. Chesnut tolerated her surgery well and has tolerated chemotherapy and radiation treatments.  Her ECG today is stable, although she does have first-degree AV block.  Her palpitations have almost completely resolved.  She is on Toprol-XL 50 mg daily and I recommended she continue this therapy and will not further increase this.  She also takes spironolactone 25 mg.  On flexion 950 g twice a day.  Her QTc interval was normal at 49 ms.  Her lipid studies last year were elevated with an LDL of 140.  She apparently has been intolerant to statins and had similar symptoms on Zetia.  I will repeat lipid panel, chemistry profile and TSH studies.  Her CBC was recently evaluated and I will not repeat this.  As long as she remains stable, I will see her in one year for reevaluation or sooner from's arise.   Troy Sine, MD, [facility] 10/07/2014 1:03 PM

## 2014-10-07 NOTE — Patient Instructions (Signed)
Your physician wants you to follow-up in: 1 Year You will receive a reminder letter in the mail two months in advance. If you don't receive a letter, please call our office to schedule the follow-up appointment.  Your physician recommends that you return for lab work Fasting blood work

## 2014-10-17 NOTE — Addendum Note (Signed)
Addended byChauncy Lean. on: 10/17/2014 04:19 PM   Modules accepted: Orders

## 2014-10-21 ENCOUNTER — Telehealth: Payer: Self-pay | Admitting: *Deleted

## 2014-10-21 NOTE — Telephone Encounter (Signed)
TC from patient stating she has a bacterial eye infection and PCP wants her to start on  Erythromycin and Keflex.  Patient wants to know if this is ok with Dr. Lindi Adie.  Please advise.

## 2014-10-21 NOTE — Telephone Encounter (Signed)
Let pt know ok to take meds prescribed by her PVP.  Pt voiced understanding.

## 2014-10-25 NOTE — Progress Notes (Signed)
Electron Holiday representative Note  Diagnosis: Breast Cancer 08-28-14  The patient's CT images from her initial simulation were reviewed to plan her boost treatment to her right breast  lumpectomy cavity.  Measurements were made regarding the size and depth of the surgical bed. The boost to the lumpectomy cavity will be delivered with 15 MeV electrons; 14 Gy in 78fractions has been prescribed to the 100% isodose line.   A electron [employer] plan was reviewed and approved.  A custom electron cut-out will be used for her boost field.    -----------------------------------  Eppie Gibson, MD

## 2014-10-30 ENCOUNTER — Ambulatory Visit
Admission: RE | Admit: 2014-10-30 | Discharge: 2014-10-30 | Disposition: A | Discharge status: Home / Self Care | Payer: BC Managed Care – PPO | Source: Ambulatory Visit | Attending: Radiation Oncology | Admitting: Radiation Oncology

## 2014-10-30 ENCOUNTER — Encounter: Payer: Self-pay | Admitting: Radiation Oncology

## 2014-10-30 VITALS — BP 120/68 | HR 68 | Temp 98.2°F | Resp 20 | Ht 64.0 in | Wt 156.0 lb

## 2014-10-30 DIAGNOSIS — C50311 Malignant neoplasm of lower-inner quadrant of right female breast: Secondary | ICD-10-CM

## 2014-10-30 NOTE — Progress Notes (Signed)
Radiation Oncology         [phone] ________________________________  Name: Tami [name] MRN: [id number]  Date: 10/30/2014  DOB: 1950/11/07  Follow-Up Visit Note  Outpatient  CC: [name], MD  Jackolyn Confer, MD  Diagnosis and Prior Radiotherapy:    ICD-9-CM ICD-10-CM   1. Breast cancer of lower-inner quadrant of right female breast 174.3 C50.311     Diagnosis:   Stage I pT1c pN0 cM0 Right Breast LIQ Invasive Ductal Carcinoma, ER+ / PR+ / Her2neg, Grade II  Indication for treatment:  curative       Radiation treatment dates:   08/06/2014-09/16/2014  Site/dose:   1) Right breast, 46 Gy in 23 fractions 2) Right breast boost, 14 Gy in 7 fractions   Narrative:  The patient returns today for routine follow-up. Follow up s/p rad txs right breast 08/06/14-09/16/14. The skin has healed well with slight tanning only. Pt's appetite is good. Started Arimidex 1mg  daily. The pt experiences hot flashes with the Arimidex.   The pt feels well and denies pain.   ALLERGIES:  is allergic to latex; sulfonamide derivatives; clarithromycin; crestor; statins; and ciprofloxacin hcl.  Meds: Current Outpatient Prescriptions  Medication Sig Dispense Refill  . amitriptyline (ELAVIL) 25 MG tablet Take 25 mg by mouth at bedtime as needed for sleep. When patient has migraines    . anastrozole (ARIMIDEX) 1 MG tablet Take 1 tablet (1 mg total) by mouth daily. 90 tablet 3  . aspirin EC 81 MG tablet Take 81 mg by mouth daily.    . flecainide (TAMBOCOR) 100 MG tablet Take 0.5 tablets (50 mg total) by mouth 2 (two) times daily. 90 tablet 3  . gabapentin (NEURONTIN) 300 MG capsule Take 1 capsule (300 mg total) by mouth 3 (three) times daily. PATIENT NEEDS OFFICE VISIT FOR ADDITIONAL REFILLS 90 capsule 0  . hyaluronate sodium (RADIAPLEXRX) GEL Apply 1 application topically 2 (two) times daily.    [name] HYDROcodone-acetaminophen (NORCO) 5-325 MG per tablet Take 1 tablet by mouth every 6 (six) hours as needed  for moderate pain. 30 tablet 0  . ibuprofen (ADVIL,MOTRIN) 200 MG tablet Take 200 mg by mouth every 6 (six) hours as needed for headache or moderate pain.    [name] lidocaine-prilocaine (EMLA) cream Apply 1 application topically as needed. 30 g 0  . LORazepam (ATIVAN) 0.5 MG tablet Take 1 tablet (0.5 mg total) by mouth every 6 (six) hours as needed (Nausea or vomiting). 30 tablet 0  . Meth-Hyo-M Bl-Na Phos-Ph Sal (URIBEL) 118 MG CAPS Take 118 mg by mouth 4 (four) times daily as needed (for bladder).   2  . metoprolol succinate (TOPROL-XL) 50 MG 24 hr tablet Take 1 tablet (50 mg total) by mouth daily. Take with or immediately following a meal. 90 tablet 3  . PARoxetine (PAXIL) 20 MG tablet Take 1 tablet (20 mg total) by mouth daily. 90 tablet 2  . prochlorperazine (COMPAZINE) 10 MG tablet Take 1 tablet (10 mg total) by mouth every 6 (six) hours as needed (Nausea or vomiting). 30 tablet 1  . spironolactone (ALDACTONE) 25 MG tablet Take 1 tablet (25 mg total) by mouth daily. 30 tablet 3  . UNABLE TO FIND Apply 1 each topically as needed. 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 valACYclovir (VALTREX) 500 MG tablet Take 1 tablet (500 mg total) by mouth 2 (two) times daily as needed. 60 tablet 11   No current facility-administered medications for this  encounter.    Physical Findings: The patient is in no acute distress. Patient is alert and oriented.  height is 5\' 4"  (1.626 m) and weight is 156 lb (70.761 kg). Her oral temperature is 98.2 F (36.8 C). Her blood pressure is 120/68 and her pulse is 68. Her respiration is 20. skin healed well over right breast, mild tanning      Lab Findings: Lab Results  Component Value Date   WBC 3.2* 09/11/2014   HGB 12.3 09/11/2014   HCT 36.7 09/11/2014   MCV 92.7 09/11/2014   PLT 184 09/11/2014    Radiographic Findings: No results found.  Impression/Plan: healed well from RT.  I encouraged her to  continue with yearly mammography and followup with medical oncology. I will see her back on an as-needed basis. I have encouraged her to call if she has any issues or concerns in the future. I wished her the very best.    This document serves as a record of services personally performed by Eppie Gibson, MD. It was created on her behalf by Darcus Austin, a trained medical scribe. The creation of this record is based on the scribe's personal observations and the provider's statements to them. This document has been checked and approved by the attending provider.    _____________________________________   Eppie Gibson, MD

## 2014-10-30 NOTE — Progress Notes (Addendum)
Follow up s/p rad txs right breast  08/06/14-09/16/14, well healed, slight tanning only, on Keflex for eye infection 1 more day and will be completed, appetite good, started Arimidex 1mg  daily, no scheduled yet for next mammogram, no pain, still fatigued 8:31 AM LMP 07/06/1985  BP 120/68 mmHg  Pulse 68  Temp(Src) 98.2 F (36.8 C) (Oral)  Resp 20  Ht 5\' 4"  (1.626 m)  Wt 156 lb (70.761 kg)  BMI 26.76 kg/m2  LMP 07/06/1985  Wt Readings from Last 3 Encounters:  10/07/14 156 lb 12.8 oz (71.124 kg)  09/16/14 156 lb 3.2 oz (70.852 kg)  09/12/14 156 lb 11.2 oz (71.079 kg)

## 2014-11-26 ENCOUNTER — Telehealth: Payer: Self-pay | Admitting: Hematology and Oncology

## 2014-11-26 NOTE — Telephone Encounter (Signed)
Spoke with patient as she needed to make a flush appointment

## 2014-11-27 ENCOUNTER — Ambulatory Visit (HOSPITAL_BASED_OUTPATIENT_CLINIC_OR_DEPARTMENT_OTHER): Payer: BC Managed Care – PPO

## 2014-11-27 VITALS — BP 121/70 | HR 60 | Temp 98.7°F

## 2014-11-27 DIAGNOSIS — Z452 Encounter for adjustment and management of vascular access device: Secondary | ICD-10-CM

## 2014-11-27 DIAGNOSIS — Z95828 Presence of other vascular implants and grafts: Secondary | ICD-10-CM

## 2014-11-27 DIAGNOSIS — C50311 Malignant neoplasm of lower-inner quadrant of right female breast: Secondary | ICD-10-CM

## 2014-11-27 MED ORDER — SODIUM CHLORIDE 0.9 % IJ SOLN
10.0000 mL | INTRAMUSCULAR | Status: DC | PRN
  Administered 2014-11-27: 10 mL via INTRAVENOUS
  Filled 2014-11-27: qty 10

## 2014-11-27 MED ORDER — HEPARIN SOD (PORK) LOCK FLUSH 100 UNIT/ML IV SOLN
500.0000 [IU] | Freq: Once | INTRAVENOUS | Status: AC
  Administered 2014-11-27: 500 [IU] via INTRAVENOUS
  Filled 2014-11-27: qty 5

## 2014-11-27 NOTE — Patient Instructions (Signed)

## 2014-11-28 LAB — COMPREHENSIVE METABOLIC PANEL
ALBUMIN: 4.2 g/dL (ref 3.5–5.2)
ALK PHOS: 88 U/L (ref 39–117)
ALT: 22 U/L (ref 0–35)
AST: 24 U/L (ref 0–37)
BILIRUBIN TOTAL: 0.5 mg/dL (ref 0.2–1.2)
BUN: 12 mg/dL (ref 6–23)
CHLORIDE: 106 meq/L (ref 96–112)
CO2: 27 meq/L (ref 19–32)
Calcium: 9.5 mg/dL (ref 8.4–10.5)
Creat: 0.8 mg/dL (ref 0.50–1.10)
GLUCOSE: 93 mg/dL (ref 70–99)
Potassium: 4.8 mEq/L (ref 3.5–5.3)
Sodium: 140 mEq/L (ref 135–145)
TOTAL PROTEIN: 7.1 g/dL (ref 6.0–8.3)

## 2014-11-28 LAB — TSH: TSH: 0.746 u[IU]/mL (ref 0.350–4.500)

## 2014-11-28 LAB — LIPID PANEL
CHOL/HDL RATIO: 3 ratio
Cholesterol: 223 mg/dL — ABNORMAL HIGH (ref 0–200)
HDL: 75 mg/dL (ref >=46)
LDL Cholesterol: 132 mg/dL — ABNORMAL HIGH (ref 0–99)
TRIGLYCERIDES: 80 mg/dL (ref <150)
VLDL: 16 mg/dL (ref 0–40)

## 2014-12-04 NOTE — Assessment & Plan Note (Signed)
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 status post lumpectomy, Oncotype DX recurrence score 29 intermediate risk 19% risk of recurrence with hormonal therapy alone  Chemotherapy summary: Taxotere and Cytoxan 4 started 05/07/2014 completed 07/09/2014 Long-term chemotherapy toxicities: Neuropathy Radiation therapy: Started 08/07/2014 completed 09/16/14  Anastrozole 1 mg daily started 10/04/14 Anastrozole Toxicities:  RTC in 6 months

## 2014-12-05 ENCOUNTER — Telehealth: Payer: Self-pay | Admitting: Hematology and Oncology

## 2014-12-05 ENCOUNTER — Other Ambulatory Visit (HOSPITAL_BASED_OUTPATIENT_CLINIC_OR_DEPARTMENT_OTHER): Payer: BC Managed Care – PPO

## 2014-12-05 ENCOUNTER — Ambulatory Visit (HOSPITAL_BASED_OUTPATIENT_CLINIC_OR_DEPARTMENT_OTHER): Payer: BC Managed Care – PPO | Admitting: Hematology and Oncology

## 2014-12-05 VITALS — BP 130/70 | HR 99 | Temp 98.1°F | Resp 18 | Ht 64.0 in | Wt 159.7 lb

## 2014-12-05 DIAGNOSIS — C50311 Malignant neoplasm of lower-inner quadrant of right female breast: Secondary | ICD-10-CM

## 2014-12-05 DIAGNOSIS — Z79811 Long term (current) use of aromatase inhibitors: Secondary | ICD-10-CM

## 2014-12-05 DIAGNOSIS — Z170 Estrogen receptor positive status [ER+]: Secondary | ICD-10-CM | POA: Diagnosis not present

## 2014-12-05 LAB — COMPREHENSIVE METABOLIC PANEL (CC13)
ALK PHOS: 91 U/L (ref 40–150)
ALT: 20 U/L (ref 0–55)
AST: 21 U/L (ref 5–34)
Albumin: 3.5 g/dL (ref 3.5–5.0)
Anion Gap: 5 mEq/L (ref 3–11)
BUN: 11.7 mg/dL (ref 7.0–26.0)
CALCIUM: 9.1 mg/dL (ref 8.4–10.4)
CHLORIDE: 109 meq/L (ref 98–109)
CO2: 27 meq/L (ref 22–29)
Creatinine: 0.8 mg/dL (ref 0.6–1.1)
GLUCOSE: 83 mg/dL (ref 70–140)
Potassium: 4 mEq/L (ref 3.5–5.1)
SODIUM: 141 meq/L (ref 136–145)
Total Bilirubin: 0.39 mg/dL (ref 0.20–1.20)
Total Protein: 6.8 g/dL (ref 6.4–8.3)

## 2014-12-05 LAB — CBC WITH DIFFERENTIAL/PLATELET
BASO%: 0.2 % (ref 0.0–2.0)
Basophils Absolute: 0 10*3/uL (ref 0.0–0.1)
EOS ABS: 0.1 10*3/uL (ref 0.0–0.5)
EOS%: 2 % (ref 0.0–7.0)
HCT: 36 % (ref 34.8–46.6)
HGB: 12.2 g/dL (ref 11.6–15.9)
LYMPH%: 24.7 % (ref 14.0–49.7)
MCH: 31.2 pg (ref 25.1–34.0)
MCHC: 33.9 g/dL (ref 31.5–36.0)
MCV: 92.1 fL (ref 79.5–101.0)
MONO#: 0.4 10*3/uL (ref 0.1–0.9)
MONO%: 9.1 % (ref 0.0–14.0)
NEUT%: 64 % (ref 38.4–76.8)
NEUTROS ABS: 2.9 10*3/uL (ref 1.5–6.5)
Platelets: 216 10*3/uL (ref 145–400)
RBC: 3.91 10*6/uL (ref 3.70–5.45)
RDW: 13.5 % (ref 11.2–14.5)
WBC: 4.5 10*3/uL (ref 3.9–10.3)
lymph#: 1.1 10*3/uL (ref 0.9–3.3)

## 2014-12-05 NOTE — Telephone Encounter (Signed)
Appointments made and avs printed for patient °

## 2014-12-05 NOTE — Progress Notes (Signed)
Patient Care Team: [name], MD as PCP - General (Family Medicine) Terance Ice, MD (Cardiology) Carolan Clines, MD as Attending Physician (Urology)  DIAGNOSIS: Breast cancer of lower-inner quadrant of right female breast   Staging form: Breast, AJCC 7th Edition     Clinical: Stage IA (T1b, N0, cM0) - Signed by Rulon Eisenmenger, MD on 02/26/2014     Pathologic: Stage IA (T1c, N0, cM0) - Signed by Nicholas Lose, MD on 09/12/2014   SUMMARY OF ONCOLOGIC HISTORY:   Breast cancer of lower-inner quadrant of right female breast   01/18/2014 Mammogram U/S Breast: 7 x 7 x 9 mm irregular taller than wide hypoechoic mass with a peripheral echogenic rim in the right breast 4 o'clock position 7 cm from the nipple. No right axillary lymphadenopathy.    02/14/2014 Initial Diagnosis Breast cancer-right Er 100%, PR 94%, Ki 67: 17%; Her2 Neg ratio 1.74; T1bN0Mo (Clinical Stage 1A)   04/02/2014 Surgery Right breast lumpectomy: Invasive ductal carcinoma negative for lymph vascular invasion, 1.6 cm, grade 2, 3 SLN negative, reexcision margins benign, ER 100%, PR 94%, HER-2 negative Ki-67 70% T1 C. N0 M0 stage IA: Oncotype DX 29 (19%ROR)   05/07/2014 - 07/09/2014 Chemotherapy Taxotere Cytoxan adjuvant chemotherapy every 3 weeks x4 cycles (after cycle one hospitalization for pain related to Neulasta and severe gastritis due to steroids)   08/07/2014 - 09/16/2014 Radiation Therapy Adjuvant radiation therapy   10/04/2014 -  Anti-estrogen oral therapy Anastrozole 1 mg daily    CHIEF COMPLIANT: Follow-up on anastrozole  INTERVAL HISTORY: Tami [name] is a 64 year old above-mentioned history of right-sided breast cancer treated with lumpectomy followed by adjuvant chemotherapy and radiation and is currently anastrozole started 10/04/2014. She is tolerating anastrozole fairly well. She complains of bilateral hip pains which makes it very difficult sometimes to ambulate. It is mostly stiffness which then gets better  when she moves.  REVIEW OF SYSTEMS:   Constitutional: Denies fevers, chills or abnormal weight loss Eyes: Denies blurriness of vision Ears, nose, mouth, throat, and face: Denies mucositis or sore throat Respiratory: Denies cough, dyspnea or wheezes Cardiovascular: Denies palpitation, chest discomfort or lower extremity swelling Gastrointestinal:  Denies nausea, heartburn or change in bowel habits Skin: Denies abnormal skin rashes Lymphatics: Denies new lymphadenopathy or easy bruising Neurological:Denies numbness, tingling or new weaknesses Behavioral/Psych: Mood is stable, no new changes  Breast:  denies any pain or lumps or nodules in either breasts All other systems were reviewed with the patient and are negative.  I have reviewed the past medical history, past surgical history, social history and family history with the patient and they are unchanged from previous note.  ALLERGIES:  is allergic to latex; sulfonamide derivatives; clarithromycin; crestor; statins; and ciprofloxacin hcl.  MEDICATIONS:  Current Outpatient Prescriptions  Medication Sig Dispense Refill  . amitriptyline (ELAVIL) 25 MG tablet Take 25 mg by mouth at bedtime as needed for sleep. When patient has migraines    . anastrozole (ARIMIDEX) 1 MG tablet Take 1 tablet (1 mg total) by mouth daily. 90 tablet 3  . aspirin EC 81 MG tablet Take 81 mg by mouth daily.    . flecainide (TAMBOCOR) 100 MG tablet Take 0.5 tablets (50 mg total) by mouth 2 (two) times daily. 90 tablet 3  . gabapentin (NEURONTIN) 300 MG capsule Take 1 capsule (300 mg total) by mouth 3 (three) times daily. PATIENT NEEDS OFFICE VISIT FOR ADDITIONAL REFILLS 90 capsule 0  . hyaluronate sodium (RADIAPLEXRX) GEL Apply 1 application topically 2 (two) times  daily.    [name] HYDROcodone-acetaminophen (NORCO) 5-325 MG per tablet Take 1 tablet by mouth every 6 (six) hours as needed for moderate pain. 30 tablet 0  . ibuprofen (ADVIL,MOTRIN) 200 MG tablet Take 200 mg  by mouth every 6 (six) hours as needed for headache or moderate pain.    [name] lidocaine-prilocaine (EMLA) cream Apply 1 application topically as needed. 30 g 0  . LORazepam (ATIVAN) 0.5 MG tablet Take 1 tablet (0.5 mg total) by mouth every 6 (six) hours as needed (Nausea or vomiting). 30 tablet 0  . Meth-Hyo-M Bl-Na Phos-Ph Sal (URIBEL) 118 MG CAPS Take 118 mg by mouth 4 (four) times daily as needed (for bladder).   2  . metoprolol succinate (TOPROL-XL) 50 MG 24 hr tablet Take 1 tablet (50 mg total) by mouth daily. Take with or immediately following a meal. 90 tablet 3  . PARoxetine (PAXIL) 20 MG tablet Take 1 tablet (20 mg total) by mouth daily. 90 tablet 2  . prochlorperazine (COMPAZINE) 10 MG tablet Take 1 tablet (10 mg total) by mouth every 6 (six) hours as needed (Nausea or vomiting). 30 tablet 1  . spironolactone (ALDACTONE) 25 MG tablet Take 1 tablet (25 mg total) by mouth daily. 30 tablet 3  . UNABLE TO FIND Apply 1 each topically as needed. 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 valACYclovir (VALTREX) 500 MG tablet Take 1 tablet (500 mg total) by mouth 2 (two) times daily as needed. 60 tablet 11   No current facility-administered medications for this visit.    PHYSICAL EXAMINATION: ECOG PERFORMANCE STATUS: 1 - Symptomatic but completely ambulatory  There were no vitals filed for this visit. There were no vitals filed for this visit.  GENERAL:alert, no distress and comfortable SKIN: skin color, texture, turgor are normal, no rashes or significant lesions EYES: normal, Conjunctiva are pink and non-injected, sclera clear OROPHARYNX:no exudate, no erythema and lips, buccal mucosa, and tongue normal  NECK: supple, thyroid normal size, non-tender, without nodularity LYMPH:  no palpable lymphadenopathy in the cervical, axillary or inguinal LUNGS: clear to auscultation and percussion with normal breathing effort HEART: regular rate  & rhythm and no murmurs and no lower extremity edema ABDOMEN:abdomen soft, non-tender and normal bowel sounds Musculoskeletal:no cyanosis of digits and no clubbing  NEURO: alert & oriented x 3 with fluent speech, no focal motor/sensory deficits  LABORATORY DATA:  I have reviewed the data as listed   Chemistry      Component Value Date/Time   NA 140 11/27/2014 1339   NA 141 09/11/2014 1000   K 4.8 11/27/2014 1339   K 4.0 09/11/2014 1000   CL 106 11/27/2014 1339   CO2 27 11/27/2014 1339   CO2 24 09/11/2014 1000   BUN 12 11/27/2014 1339   BUN 8.0 09/11/2014 1000   CREATININE 0.80 11/27/2014 1339   CREATININE 0.8 09/11/2014 1000   CREATININE 0.80 05/15/2014 0500      Component Value Date/Time   CALCIUM 9.5 11/27/2014 1339   CALCIUM 9.3 09/11/2014 1000   ALKPHOS 88 11/27/2014 1339   ALKPHOS 76 09/11/2014 1000   AST 24 11/27/2014 1339   AST 25 09/11/2014 1000   ALT 22 11/27/2014 1339   ALT 23 09/11/2014 1000   BILITOT 0.5 11/27/2014 1339   BILITOT 0.28 09/11/2014 1000       Lab Results  Component Value Date   WBC 3.2* 09/11/2014   HGB 12.3 09/11/2014  HCT 36.7 09/11/2014   MCV 92.7 09/11/2014   PLT 184 09/11/2014   NEUTROABS 2.0 09/11/2014    ASSESSMENT & PLAN:  Breast cancer of lower-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 status post lumpectomy, Oncotype DX recurrence score 29 intermediate risk 19% risk of recurrence with hormonal therapy alone  Chemotherapy summary: Taxotere and Cytoxan 4 started 05/07/2014 completed 07/09/2014 Long-term chemotherapy toxicities: Neuropathy Radiation therapy: Started 08/07/2014 completed 09/16/14  Anastrozole 1 mg daily started 10/04/14 Anastrozole Toxicities: 1. Severe pain in bilateral hips Patient attributes this to anastrozole. We discussed that I would switch her from anastrozole to Aromasin if she continues to have these  symptoms in the next 3 months.  RTC in 3 months   No orders of the defined types were placed in this encounter.   The patient has a good understanding of the overall plan. she agrees with it. she will call with any problems that may develop before the next visit here.   Rulon Eisenmenger, MD

## 2014-12-06 ENCOUNTER — Encounter: Payer: Self-pay | Admitting: *Deleted

## 2015-02-04 ENCOUNTER — Other Ambulatory Visit: Payer: Self-pay | Admitting: Cardiovascular Disease

## 2015-02-04 NOTE — Telephone Encounter (Signed)
REFILL 

## 2015-02-28 ENCOUNTER — Other Ambulatory Visit: Payer: Self-pay | Admitting: Hematology and Oncology

## 2015-02-28 DIAGNOSIS — C50911 Malignant neoplasm of unspecified site of right female breast: Secondary | ICD-10-CM

## 2015-03-03 ENCOUNTER — Other Ambulatory Visit: Payer: Self-pay

## 2015-03-03 ENCOUNTER — Other Ambulatory Visit: Payer: Self-pay | Admitting: Hematology and Oncology

## 2015-03-03 DIAGNOSIS — C50911 Malignant neoplasm of unspecified site of right female breast: Secondary | ICD-10-CM

## 2015-03-05 ENCOUNTER — Inpatient Hospital Stay: Admission: RE | Admit: 2015-03-05 | Payer: BC Managed Care – PPO | Source: Ambulatory Visit

## 2015-03-06 ENCOUNTER — Ambulatory Visit: Payer: BC Managed Care – PPO | Admitting: Hematology and Oncology

## 2015-03-06 ENCOUNTER — Telehealth: Payer: Self-pay | Admitting: Hematology and Oncology

## 2015-03-06 NOTE — Assessment & Plan Note (Addendum)
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 status post lumpectomy, Oncotype DX recurrence score 29 intermediate risk 19% risk of recurrence with hormonal therapy alone  Chemotherapy summary: Taxotere and Cytoxan 4 started 05/07/2014 completed 07/09/2014 Long-term chemotherapy toxicities: Neuropathy Radiation therapy: Started 08/07/2014 completed 09/16/14  Anastrozole 1 mg daily started 10/04/14 Anastrozole Toxicities: 1. Severe pain in bilateral hips Patient attributes this to anastrozole. We discussed that I would switch her from anastrozole to Aromasin if she continues to have these symptoms in the next 3 months.  Breast Cancer Surveillance: 1. Breast exam 03/06/2015: Normal 2. Mammogram and ultrasound scheduled for 03/07/2015   RTC in 6 months

## 2015-03-06 NOTE — Telephone Encounter (Signed)
Returned patients call to reschedule appointment °

## 2015-03-07 ENCOUNTER — Ambulatory Visit
Admission: RE | Admit: 2015-03-07 | Discharge: 2015-03-07 | Disposition: A | Discharge status: Home / Self Care | Payer: Medicare Other | Source: Ambulatory Visit | Attending: Hematology and Oncology | Admitting: Hematology and Oncology

## 2015-03-07 ENCOUNTER — Encounter (INDEPENDENT_AMBULATORY_CARE_PROVIDER_SITE_OTHER): Payer: Self-pay

## 2015-03-07 DIAGNOSIS — C50911 Malignant neoplasm of unspecified site of right female breast: Secondary | ICD-10-CM

## 2015-03-18 ENCOUNTER — Ambulatory Visit (HOSPITAL_BASED_OUTPATIENT_CLINIC_OR_DEPARTMENT_OTHER): Payer: Medicare Other | Admitting: Hematology and Oncology

## 2015-03-18 ENCOUNTER — Other Ambulatory Visit: Payer: Self-pay | Admitting: Family Medicine

## 2015-03-18 ENCOUNTER — Encounter: Payer: Self-pay | Admitting: Hematology and Oncology

## 2015-03-18 ENCOUNTER — Telehealth: Payer: Self-pay | Admitting: Hematology and Oncology

## 2015-03-18 VITALS — BP 136/77 | HR 69 | Temp 98.0°F | Resp 18 | Ht 64.0 in | Wt 160.4 lb

## 2015-03-18 DIAGNOSIS — C50311 Malignant neoplasm of lower-inner quadrant of right female breast: Secondary | ICD-10-CM

## 2015-03-18 DIAGNOSIS — Z170 Estrogen receptor positive status [ER+]: Secondary | ICD-10-CM

## 2015-03-18 DIAGNOSIS — Z79811 Long term (current) use of aromatase inhibitors: Secondary | ICD-10-CM

## 2015-03-18 MED ORDER — PALONOSETRON HCL INJECTION 0.25 MG/5ML
INTRAVENOUS | Status: AC
  Filled 2015-03-18: qty 5

## 2015-03-18 NOTE — Telephone Encounter (Signed)
Appointments made and avs printed for patient °

## 2015-03-18 NOTE — Progress Notes (Signed)
Patient Care Team: [name], MD as PCP - General (Family Medicine) Terance Ice, MD (Cardiology) Carolan Clines, MD as Attending Physician (Urology)  DIAGNOSIS: Breast cancer of lower-inner quadrant of right female breast   Staging form: Breast, AJCC 7th Edition     Clinical: Stage IA (T1b, N0, cM0) - Signed by Rulon Eisenmenger, MD on 02/26/2014     Pathologic: Stage IA (T1c, N0, cM0) - Signed by Nicholas Lose, MD on 09/12/2014   SUMMARY OF ONCOLOGIC HISTORY:   Breast cancer of lower-inner quadrant of right female breast   01/18/2014 Mammogram U/S Breast: 7 x 7 x 9 mm irregular taller than wide hypoechoic mass with a peripheral echogenic rim in the right breast 4 o'clock position 7 cm from the nipple. No right axillary lymphadenopathy.    02/14/2014 Initial Diagnosis Breast cancer-right Er 100%, PR 94%, Ki 67: 17%; Her2 Neg ratio 1.74; T1bN0Mo (Clinical Stage 1A)   04/02/2014 Surgery Right breast lumpectomy: Invasive ductal carcinoma negative for lymph vascular invasion, 1.6 cm, grade 2, 3 SLN negative, reexcision margins benign, ER 100%, PR 94%, HER-2 negative Ki-67 70% T1 C. N0 M0 stage IA: Oncotype DX 29 (19%ROR)   05/07/2014 - 07/09/2014 Chemotherapy Taxotere Cytoxan adjuvant chemotherapy every 3 weeks x4 cycles (after cycle one hospitalization for pain related to Neulasta and severe gastritis due to steroids)   08/07/2014 - 09/16/2014 Radiation Therapy Adjuvant radiation therapy   10/04/2014 -  Anti-estrogen oral therapy Anastrozole 1 mg daily    CHIEF COMPLIANT: follow-up on anastrozole  INTERVAL HISTORY: Tami [name] is a 64 year old with above-mentioned history of right-sided breast cancer completed lumpectomy adjuvant chemotherapy and radiation and is currently on anastrozole therapy. She is tolerating anastrozole moderately well. She complains of hot flashes that seem to come on several times in the day. She also complains of muscle aches and pains especially in her hips and  lower back. She reports that she has always had chronic low back pain for which she had previously received injections. Since chemotherapy she has not had any injections in her pain appears to be bothering her.  REVIEW OF SYSTEMS:   Constitutional: Denies fevers, chills or abnormal weight loss Eyes: Denies blurriness of vision Ears, nose, mouth, throat, and face: Denies mucositis or sore throat Respiratory: Denies cough, dyspnea or wheezes Cardiovascular: Denies palpitation, chest discomfort or lower extremity swelling Gastrointestinal:  Denies nausea, heartburn or change in bowel habits Skin: Denies abnormal skin rashes Lymphatics: Denies new lymphadenopathy or easy bruising Neurological:Denies numbness, tingling or new weaknesses Behavioral/Psych: Mood is stable, no new changes  Breast:  denies any pain or lumps or nodules in either breasts All other systems were reviewed with the patient and are negative.  I have reviewed the past medical history, past surgical history, social history and family history with the patient and they are unchanged from previous note.  ALLERGIES:  is allergic to latex; sulfonamide derivatives; clarithromycin; crestor; statins; and ciprofloxacin hcl.  MEDICATIONS:  Current Outpatient Prescriptions  Medication Sig Dispense Refill  . amitriptyline (ELAVIL) 25 MG tablet Take 25 mg by mouth at bedtime as needed for sleep. When patient has migraines    . anastrozole (ARIMIDEX) 1 MG tablet Take 1 tablet (1 mg total) by mouth daily. 90 tablet 3  . aspirin EC 81 MG tablet Take 81 mg by mouth daily.    . flecainide (TAMBOCOR) 100 MG tablet TAKE 0.5 TABLETS (50 MG TOTAL) BY MOUTH 2 (TWO) TIMES DAILY. 90 tablet 3  . gabapentin (NEURONTIN) 300  MG capsule Take 1 capsule (300 mg total) by mouth 3 (three) times daily. PATIENT NEEDS OFFICE VISIT FOR ADDITIONAL REFILLS 90 capsule 0  . hyaluronate sodium (RADIAPLEXRX) GEL Apply 1 application topically 2 (two) times daily.     [name] HYDROcodone-acetaminophen (NORCO) 5-325 MG per tablet Take 1 tablet by mouth every 6 (six) hours as needed for moderate pain. 30 tablet 0  . ibuprofen (ADVIL,MOTRIN) 200 MG tablet Take 200 mg by mouth every 6 (six) hours as needed for headache or moderate pain.    [name] lidocaine-prilocaine (EMLA) cream Apply 1 application topically as needed. 30 g 0  . LORazepam (ATIVAN) 0.5 MG tablet Take 1 tablet (0.5 mg total) by mouth every 6 (six) hours as needed (Nausea or vomiting). 30 tablet 0  . Meth-Hyo-M Bl-Na Phos-Ph Sal (URIBEL) 118 MG CAPS Take 118 mg by mouth 4 (four) times daily as needed (for bladder).   2  . metoprolol succinate (TOPROL-XL) 50 MG 24 hr tablet Take 1 tablet (50 mg total) by mouth daily. Take with or immediately following a meal. 90 tablet 3  . PARoxetine (PAXIL) 20 MG tablet Take 1 tablet (20 mg total) by mouth daily. 90 tablet 2  . prochlorperazine (COMPAZINE) 10 MG tablet Take 1 tablet (10 mg total) by mouth every 6 (six) hours as needed (Nausea or vomiting). 30 tablet 1  . spironolactone (ALDACTONE) 25 MG tablet Take 1 tablet (25 mg total) by mouth daily. 30 tablet 3  . UNABLE TO FIND Apply 1 each topically as needed. 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 valACYclovir (VALTREX) 500 MG tablet Take 1 tablet (500 mg total) by mouth 2 (two) times daily as needed. 60 tablet 11   No current facility-administered medications for this visit.    PHYSICAL EXAMINATION: ECOG PERFORMANCE STATUS: 1 - Symptomatic but completely ambulatory  Filed Vitals:   03/18/15 1026  BP: 136/77  Pulse: 69  Temp: 98 F (36.7 C)  Resp: 18   Filed Weights   03/18/15 1026  Weight: 160 lb 6.4 oz (72.757 kg)    GENERAL:alert, no distress and comfortable SKIN: skin color, texture, turgor are normal, no rashes or significant lesions EYES: normal, Conjunctiva are pink and non-injected, sclera clear OROPHARYNX:no exudate, no erythema and  lips, buccal mucosa, and tongue normal  NECK: supple, thyroid normal size, non-tender, without nodularity LYMPH:  no palpable lymphadenopathy in the cervical, axillary or inguinal LUNGS: clear to auscultation and percussion with normal breathing effort HEART: regular rate & rhythm and no murmurs and no lower extremity edema ABDOMEN:abdomen soft, non-tender and normal bowel sounds Musculoskeletal:no cyanosis of digits and no clubbing  NEURO: alert & oriented x 3 with fluent speech, no focal motor/sensory deficits   LABORATORY DATA:  I have reviewed the data as listed   Chemistry      Component Value Date/Time   NA 141 12/05/2014 1039   NA 140 11/27/2014 1339   K 4.0 12/05/2014 1039   K 4.8 11/27/2014 1339   CL 106 11/27/2014 1339   CO2 27 12/05/2014 1039   CO2 27 11/27/2014 1339   BUN 11.7 12/05/2014 1039   BUN 12 11/27/2014 1339   CREATININE 0.8 12/05/2014 1039   CREATININE 0.80 11/27/2014 1339   CREATININE 0.80 05/15/2014 0500      Component Value Date/Time   CALCIUM 9.1 12/05/2014 1039   CALCIUM 9.5 11/27/2014 1339   ALKPHOS 91 12/05/2014 1039   ALKPHOS 88 11/27/2014  1339   AST 21 12/05/2014 1039   AST 24 11/27/2014 1339   ALT 20 12/05/2014 1039   ALT 22 11/27/2014 1339   BILITOT 0.39 12/05/2014 1039   BILITOT 0.5 11/27/2014 1339       Lab Results  Component Value Date   WBC 4.5 12/05/2014   HGB 12.2 12/05/2014   HCT 36.0 12/05/2014   MCV 92.1 12/05/2014   PLT 216 12/05/2014   NEUTROABS 2.9 12/05/2014     ASSESSMENT & PLAN:  Breast cancer of lower-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 status post lumpectomy, Oncotype DX recurrence score 29 intermediate risk 19% risk of recurrence with hormonal therapy alone  Chemotherapy summary: Taxotere and Cytoxan 4 started 05/07/2014 completed 07/09/2014 Long-term chemotherapy toxicities: Neuropathy Radiation  therapy: Started 08/07/2014 completed 09/16/14  Anastrozole 1 mg daily started 10/04/14 Anastrozole Toxicities: 1. Severe pain in bilateral hips 2. Hot flashes When these symptoms are bearable and she would like to remain on anastrozole.  We will request Dr. Zella Richer to remove her port. RTC in 6 months   No orders of the defined types were placed in this encounter.   The patient has a good understanding of the overall plan. she agrees with it. she will call with any problems that may develop before the next visit here.   Rulon Eisenmenger, MD

## 2015-03-18 NOTE — Assessment & Plan Note (Signed)
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 status post lumpectomy, Oncotype DX recurrence score 29 intermediate risk 19% risk of recurrence with hormonal therapy alone  Chemotherapy summary: Taxotere and Cytoxan 4 started 05/07/2014 completed 07/09/2014 Long-term chemotherapy toxicities: Neuropathy Radiation therapy: Started 08/07/2014 completed 09/16/14  Anastrozole 1 mg daily started 10/04/14 Anastrozole Toxicities: 1. Severe pain in bilateral hips Patient attributes this to anastrozole. We discussed that I would switch her from anastrozole to Aromasin if she continues to have these symptoms in the next 3 months.  RTC in 6 months

## 2015-04-17 ENCOUNTER — Other Ambulatory Visit: Payer: Self-pay | Admitting: Physical Medicine and Rehabilitation

## 2015-04-17 DIAGNOSIS — Z9889 Other specified postprocedural states: Secondary | ICD-10-CM

## 2015-04-24 ENCOUNTER — Other Ambulatory Visit: Payer: Self-pay | Admitting: Family Medicine

## 2015-05-06 ENCOUNTER — Ambulatory Visit
Admission: RE | Admit: 2015-05-06 | Discharge: 2015-05-06 | Disposition: A | Discharge status: Home / Self Care | Payer: Medicare Other | Source: Ambulatory Visit | Attending: Physical Medicine and Rehabilitation | Admitting: Physical Medicine and Rehabilitation

## 2015-05-06 DIAGNOSIS — Z9889 Other specified postprocedural states: Secondary | ICD-10-CM

## 2015-05-31 ENCOUNTER — Other Ambulatory Visit: Payer: Self-pay | Admitting: Cardiovascular Disease

## 2015-06-02 NOTE — Telephone Encounter (Signed)
REFILL 

## 2015-06-06 ENCOUNTER — Ambulatory Visit (INDEPENDENT_AMBULATORY_CARE_PROVIDER_SITE_OTHER): Payer: Medicare Other

## 2015-06-06 DIAGNOSIS — Z23 Encounter for immunization: Secondary | ICD-10-CM | POA: Diagnosis not present

## 2015-06-08 ENCOUNTER — Other Ambulatory Visit: Payer: Self-pay | Admitting: Family Medicine

## 2015-06-23 ENCOUNTER — Encounter: Payer: Self-pay | Admitting: Cardiovascular Disease

## 2015-09-09 ENCOUNTER — Ambulatory Visit (INDEPENDENT_AMBULATORY_CARE_PROVIDER_SITE_OTHER): Payer: Medicare Other | Admitting: Family Medicine

## 2015-09-09 VITALS — BP 138/80 | HR 59 | Temp 98.0°F | Resp 20 | Ht 65.0 in | Wt 158.8 lb

## 2015-09-09 DIAGNOSIS — F32A Depression, unspecified: Secondary | ICD-10-CM

## 2015-09-09 DIAGNOSIS — L989 Disorder of the skin and subcutaneous tissue, unspecified: Secondary | ICD-10-CM

## 2015-09-09 DIAGNOSIS — M659 Synovitis and tenosynovitis, unspecified: Secondary | ICD-10-CM | POA: Diagnosis not present

## 2015-09-09 DIAGNOSIS — F329 Major depressive disorder, single episode, unspecified: Secondary | ICD-10-CM | POA: Diagnosis not present

## 2015-09-09 MED ORDER — PAROXETINE HCL 20 MG PO TABS: 20.0000 mg | ORAL_TABLET | Freq: Every day | ORAL | Status: DC

## 2015-09-09 NOTE — Patient Instructions (Signed)
I'm referring you to the dermatologist regarding your left leg skin lesion.  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  I have refilled your Paxil

## 2015-09-09 NOTE — Progress Notes (Signed)
Patient ID: Tami [name], female   DOB: Nov 23, 1950, 65 y.o.   MRN: [id number]  By signing my name below, I, Tami [name], attest that this documentation has been prepared under the direction and in the presence of [name], MD Electronically Signed: Ladene Artist, ED Scribe 09/09/2015 at 12:44 PM.  Patient ID: Tami [name] MRN: [id number], DOB: [birth year], 65 y.o. Date of Encounter: 09/09/2015, 12:30 PM  Primary Physician: [name], MD  Chief Complaint:  Chief Complaint  Patient presents with  . Wrist Pain    right hand x3 months  . Medication Refill    paxil     HPI: 65 y.o. year old female with history below presents with constant right hand pain for the past 6 months, worsened over the past 3 months. Pt reports increased pain that radiates into her fingers with flexing her fingers and percussion. She states that she has noticed that she has dropped more objects recently.   Skin Lesion  Pt noticed a small discolored skin spot on her left shin and was advised to f/u. She states that her mother had a similar spot that was skin cancer.   Medication Refill Pt requests a refill of Paxil at this visit. States that she has half a tablet left.   Past Medical History  Diagnosis Date  . Heart palpitations   . Osteoporosis   . Asthma   . SVT (supraventricular tachycardia) ([place])   . Numbness of arm     Right Arm  . Irregular heartbeat   . Anxiety   . Arthritis   . Atrial fibrillation ([place])   . Heartburn   . Hyperlipidemia   . Pure hypercholesterolemia   . Family history of malignant neoplasm of breast   . Anemia     as a child  . Dysrhythmia     hx AF  . Malignant neoplasm of breast (female), unspecified site   . 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 Authorizing Provider  amitriptyline (ELAVIL) 25 MG tablet Take 25 mg by mouth  at bedtime as needed for sleep. When patient has migraines   Yes Historical Provider, MD  anastrozole (ARIMIDEX) 1 MG tablet Take 1 tablet (1 mg total) by mouth daily. 09/12/14  Yes Nicholas Lose, MD  aspirin EC 81 MG tablet Take 81 mg by mouth daily.   Yes Historical Provider, MD  flecainide (TAMBOCOR) 100 MG tablet TAKE 0.5 TABLETS (50 MG TOTAL) BY MOUTH 2 (TWO) TIMES DAILY. 02/04/15  Yes Troy Sine, MD  gabapentin (NEURONTIN) 300 MG capsule Take 1 capsule (300 mg total) by mouth 3 (three) times daily. PATIENT NEEDS OFFICE VISIT FOR ADDITIONAL REFILLS   Yes [name], MD  ibuprofen (ADVIL,MOTRIN) 200 MG tablet Take 200 mg by mouth every 6 (six) hours as needed for headache or moderate pain.   Yes Historical Provider, MD  metoprolol succinate (TOPROL-XL) 50 MG 24 hr tablet TAKE 1 TABLET BY MOUTH DAILY. TAKE WITH OR IMMEDIATELY FOLLOWING A MEAL. 06/02/15  Yes Troy Sine, MD  PARoxetine (PAXIL) 20 MG tablet Take 1 tablet (20 mg total) by mouth daily. NO MORE REFILLS WITHOUT OFFICE VISIT - 2ND NOTICE 06/10/15  Yes [name], MD  spironolactone (ALDACTONE) 25 MG tablet Take 1 tablet (25 mg total) by mouth daily. 06/12/14  Yes Chauncey Cruel, MD  hyaluronate sodium (RADIAPLEXRX) GEL Apply  1 application topically 2 (two) times daily. Reported on 09/09/2015    Historical Provider, MD  Meth-Hyo-M Bl-Na Phos-Ph Sal (URIBEL) 118 MG CAPS Take 118 mg by mouth 4 (four) times daily as needed (for bladder). Reported on 09/09/2015 03/22/14   Historical Provider, MD  prochlorperazine (COMPAZINE) 10 MG tablet Take 1 tablet (10 mg total) by mouth every 6 (six) hours as needed (Nausea or vomiting). Patient not taking: Reported on 09/09/2015 04/19/14   Nicholas Lose, MD  UNABLE TO FIND Apply 1 each topically as needed. 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 Latex Swelling and Rash    Skin swelling   . Sulfonamide Derivatives Anaphylaxis, Shortness Of Breath, Nausea Only and Other (See Comments)    Sweating, headache   . Clarithromycin Nausea Only and Other (See Comments)    Headache   . Crestor [Rosuvastatin] Other (See Comments)    Muscle aches, pain, weakness in lower extremities  . Statins Other (See Comments)    Muscle soreness and weakness w/ Rosuvastatin and Zetia.  . Ciprofloxacin Hcl Nausea Only, Palpitations and Other (See Comments)    Headache     Social History   Social History  . Marital Status: Married    Spouse Name: N/A  . Number of Children: N/A  . Years of Education: N/A   Occupational History  . Not on file.   Social History Main Topics  . Smoking status: Never Smoker   . Smokeless tobacco: Never Used  . Alcohol Use: No  . Drug Use: No  . Sexual Activity: Yes     Comment: number of sex partners in the last 12 months 1   Other Topics Concern  . Not on file   Social History Narrative   Exercise walking 2 day per week for 1 hour    Review of Systems: Constitutional: negative for chills, fever, night sweats, weight changes, or fatigue  HEENT: negative for vision changes, hearing loss, congestion, rhinorrhea, ST, epistaxis, or sinus pressure Cardiovascular: negative for chest pain or palpitations Respiratory: negative for hemoptysis, wheezing, shortness of breath, or cough Abdominal: negative for abdominal pain, nausea, vomiting, diarrhea, or constipation Msk: +arthralgias  Dermatological: negative for rash, +skin lesion Neurologic: negative for headache, dizziness, or syncope All other systems reviewed and are otherwise negative with the exception to those above and in the HPI.  Physical Exam: Blood pressure 138/80, pulse 59, temperature 98 F (36.7 C), temperature source Oral, resp. rate 20, height 5\' 5"  (1.651 m), weight 158 lb 12.8 oz (72.031 kg), last menstrual period 07/06/1985, SpO2  99 %., Body mass index is 26.43 kg/(m^2). General: Well developed, well nourished, in no acute distress. Head: Normocephalic, atraumatic, eyes without discharge, sclera non-icteric, nares are without discharge. Bilateral auditory canals clear, TM's are without perforation, pearly grey and translucent with reflective cone of light bilaterally. Oral cavity moist, posterior pharynx without exudate, erythema, peritonsillar abscess, or post nasal drip.  Neck: Supple. No thyromegaly. Full ROM. No lymphadenopathy. Lungs: Clear bilaterally to auscultation without wheezes, rales, or rhonchi. Breathing is unlabored. Heart: RRR with S1 S2. No murmurs, rubs, or gallops appreciated. Abdomen: Soft, non-tender, non-distended with normoactive bowel sounds. No hepatomegaly. No rebound/guarding. No obvious abdominal masses. Msk:  Strength and tone normal for age. R hand: tender in R anatomical snuff box of the wrist. After informed consent, the R  wrist was infilitrated with depomedrol and marking. Extremities/Skin: Warm and dry. No clubbing or cyanosis. No edema. 1 cm scaly area on L anterior shin.  Neuro: Alert and oriented X 3. Moves all extremities spontaneously. Gait is normal. CNII-XII grossly in tact. Psych:  Responds to questions appropriately with a normal affect.   Labs:  ASSESSMENT AND PLAN:  65 y.o. year old female with  1. Skin lesion of left lower extremity   2. Depression   3. Synovitis and tenosynovitis     This chart was scribed in my presence and reviewed by me personally.    ICD-9-CM ICD-10-CM   1. Skin lesion of left lower extremity 709.9 L98.9 Ambulatory referral to Dermatology  2. Depression 311 F32.9   3. Synovitis and tenosynovitis 727.00 M65.9      Signed, [name], MD   Signed, [name], MD 09/09/2015 12:30 PM

## 2015-09-16 ENCOUNTER — Encounter: Payer: Self-pay | Admitting: Hematology and Oncology

## 2015-09-16 ENCOUNTER — Telehealth: Payer: Self-pay | Admitting: Hematology and Oncology

## 2015-09-16 ENCOUNTER — Ambulatory Visit (HOSPITAL_BASED_OUTPATIENT_CLINIC_OR_DEPARTMENT_OTHER): Payer: Medicare Other | Admitting: Hematology and Oncology

## 2015-09-16 VITALS — BP 129/77 | HR 66 | Temp 98.0°F | Resp 18 | Ht 65.0 in | Wt 157.3 lb

## 2015-09-16 DIAGNOSIS — Z9221 Personal history of antineoplastic chemotherapy: Secondary | ICD-10-CM

## 2015-09-16 DIAGNOSIS — C50311 Malignant neoplasm of lower-inner quadrant of right female breast: Secondary | ICD-10-CM

## 2015-09-16 DIAGNOSIS — Z170 Estrogen receptor positive status [ER+]: Secondary | ICD-10-CM

## 2015-09-16 DIAGNOSIS — Z79811 Long term (current) use of aromatase inhibitors: Secondary | ICD-10-CM

## 2015-09-16 DIAGNOSIS — Z923 Personal history of irradiation: Secondary | ICD-10-CM | POA: Diagnosis not present

## 2015-09-16 DIAGNOSIS — N951 Menopausal and female climacteric states: Secondary | ICD-10-CM | POA: Diagnosis not present

## 2015-09-16 MED ORDER — LETROZOLE 2.5 MG PO TABS: 2.5000 mg | ORAL_TABLET | Freq: Every day | ORAL | Status: DC

## 2015-09-16 NOTE — Assessment & Plan Note (Signed)
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 status post lumpectomy, Oncotype DX recurrence score 29 intermediate risk 19% risk of recurrence with hormonal therapy alone  Chemotherapy summary: Taxotere and Cytoxan 4 started 05/07/2014 completed 07/09/2014 Long-term chemotherapy toxicities: Neuropathy Radiation therapy: Started 08/07/2014 completed 09/16/14  Anastrozole 1 mg daily started 10/04/14 Anastrozole Toxicities: 1. Severe pain in bilateral hips 2. Hot flashes  Breast Cancer Surveillance: 1. Breast exam  09/16/2015: Normal 2. Mammogram 03/07/2015 No abnormalities. Postsurgical changes. Breast Density Category  C. I recommended that she get 3-D mammograms for surveillance. Discussed the differences between different breast density categories.   RTC in 6 months 

## 2015-09-16 NOTE — Telephone Encounter (Signed)
appt made and avs printed °

## 2015-09-16 NOTE — Progress Notes (Signed)
 Patient Care Team: Kurt Lauenstein, MD as PCP - General (Family Medicine) Richard Weintraub, MD (Cardiology) Sigmund Tannenbaum, MD as Attending Physician (Urology)  DIAGNOSIS: Breast cancer of lower-inner quadrant of right female breast (HCC)   Staging form: Breast, AJCC 7th Edition     Clinical: Stage IA (T1b, N0, cM0) - Signed by Vinay K Gudena, MD on 02/26/2014     Pathologic: Stage IA (T1c, N0, cM0) - Signed by Vinay Gudena, MD on 09/12/2014   SUMMARY OF ONCOLOGIC HISTORY:   Breast cancer of lower-inner quadrant of right female breast (HCC)   01/18/2014 Mammogram U/S Breast: 7 x 7 x 9 mm irregular taller than wide hypoechoic mass with a peripheral echogenic rim in the right breast 4 o'clock position 7 cm from the nipple. No right axillary lymphadenopathy.    02/14/2014 Initial Diagnosis Breast cancer-right Er 100%, PR 94%, Ki 67: 17%; Her2 Neg ratio 1.74; T1bN0Mo (Clinical Stage 1A)   04/02/2014 Surgery Right breast lumpectomy: Invasive ductal carcinoma negative for lymph vascular invasion, 1.6 cm, grade 2, 3 SLN negative, reexcision margins benign, ER 100%, PR 94%, HER-2 negative Ki-67 70% T1 C. N0 M0 stage IA: Oncotype DX 29 (19%ROR)   05/07/2014 - 07/09/2014 Chemotherapy Taxotere Cytoxan adjuvant chemotherapy every 3 weeks x4 cycles (after cycle one hospitalization for pain related to Neulasta and severe gastritis due to steroids)   08/07/2014 - 09/16/2014 Radiation Therapy Adjuvant radiation therapy   10/04/2014 -  Anti-estrogen oral therapy Anastrozole 1 mg daily changed to letrozole 09/16/2015 due to hot flashes    CHIEF COMPLIANT: severe hot flashes  INTERVAL HISTORY: Tami [name] is a 64-year-old with above-mentioned history of right breast cancer left lumpectomy followed by adjuvant chemotherapy and radiation and is currently on anastrozole. She reports that the muscle skeletal pains have improved but she is now having profound hot flashes to the point that is making her miserable.  She would like to know if there is an alternative medication that she can switch to.  REVIEW OF SYSTEMS:   Constitutional: Denies fevers, chills or abnormal weight loss Eyes: Denies blurriness of vision Ears, nose, mouth, throat, and face: Denies mucositis or sore throat Respiratory: Denies cough, dyspnea or wheezes Cardiovascular: Denies palpitation, chest discomfort Gastrointestinal:  Denies nausea, heartburn or change in bowel habits Skin: Denies abnormal skin rashes Lymphatics: Denies new lymphadenopathy or easy bruising Neurological:Denies numbness, tingling or new weaknesses Behavioral/Psych: Mood is stable, no new changes  Extremities: No lower extremity edema Breast:  denies any pain or lumps or nodules in either breasts All other systems were reviewed with the patient and are negative.  I have reviewed the past medical history, past surgical history, social history and family history with the patient and they are unchanged from previous note.  ALLERGIES:  is allergic to latex; sulfonamide derivatives; clarithromycin; crestor; statins; and ciprofloxacin hcl.  MEDICATIONS:  Current Outpatient Prescriptions  Medication Sig Dispense Refill  . amitriptyline (ELAVIL) 25 MG tablet Take 25 mg by mouth at bedtime as needed for sleep. When patient has migraines    . aspirin EC 81 MG tablet Take 81 mg by mouth daily.    . flecainide (TAMBOCOR) 100 MG tablet TAKE 0.5 TABLETS (50 MG TOTAL) BY MOUTH 2 (TWO) TIMES DAILY. 90 tablet 3  . gabapentin (NEURONTIN) 300 MG capsule Take 1 capsule (300 mg total) by mouth 3 (three) times daily. PATIENT NEEDS OFFICE VISIT FOR ADDITIONAL REFILLS 90 capsule 0  . hyaluronate sodium (RADIAPLEXRX) GEL Apply 1 application topically 2 (  two) times daily. Reported on 09/09/2015    . ibuprofen (ADVIL,MOTRIN) 200 MG tablet Take 200 mg by mouth every 6 (six) hours as needed for headache or moderate pain.    . letrozole (FEMARA) 2.5 MG tablet Take 1 tablet (2.5 mg  total) by mouth daily. 30 tablet 2  . Meth-Hyo-M Bl-Na Phos-Ph Sal (URIBEL) 118 MG CAPS Take 118 mg by mouth 4 (four) times daily as needed (for bladder). Reported on 09/09/2015  2  . metoprolol succinate (TOPROL-XL) 50 MG 24 hr tablet TAKE 1 TABLET BY MOUTH DAILY. TAKE WITH OR IMMEDIATELY FOLLOWING A MEAL. 90 tablet 2  . PARoxetine (PAXIL) 20 MG tablet Take 1 tablet (20 mg total) by mouth daily. 90 tablet 3  . prochlorperazine (COMPAZINE) 10 MG tablet Take 1 tablet (10 mg total) by mouth every 6 (six) hours as needed (Nausea or vomiting). (Patient not taking: Reported on 09/09/2015) 30 tablet 1  . spironolactone (ALDACTONE) 25 MG tablet Take 1 tablet (25 mg total) by mouth daily. 30 tablet 3  . UNABLE TO FIND Apply 1 each topically as needed. 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    PHYSICAL EXAMINATION: ECOG PERFORMANCE STATUS: 1 - Symptomatic but completely ambulatory  Filed Vitals:   09/16/15 1025  BP: 129/77  Pulse: 66  Temp: 98 F (36.7 C)  Resp: 18   Filed Weights   09/16/15 1025  Weight: 157 lb 4.8 oz (71.351 kg)    GENERAL:alert, no distress and comfortable SKIN: skin color, texture, turgor are normal, no rashes or significant lesions EYES: normal, Conjunctiva are pink and non-injected, sclera clear OROPHARYNX:no exudate, no erythema and lips, buccal mucosa, and tongue normal  NECK: supple, thyroid normal size, non-tender, without nodularity LYMPH:  no palpable lymphadenopathy in the cervical, axillary or inguinal LUNGS: clear to auscultation and percussion with normal breathing effort HEART: regular rate & rhythm and no murmurs and no lower extremity edema ABDOMEN:abdomen soft, non-tender and normal bowel sounds MUSCULOSKELETAL:no cyanosis of digits and no clubbing  NEURO: alert & oriented x 3 with fluent speech,  no focal motor/sensory deficits EXTREMITIES: No lower extremity edema BREAST: No palpable masses or nodules in either right or left breasts. No palpable axillary supraclavicular or infraclavicular adenopathy no breast tenderness or nipple discharge. (exam performed in the presence of a chaperone)  LABORATORY DATA:  I have reviewed the data as listed   Chemistry      Component Value Date/Time   NA 141 12/05/2014 1039   NA 140 11/27/2014 1339   K 4.0 12/05/2014 1039   K 4.8 11/27/2014 1339   CL 106 11/27/2014 1339   CO2 27 12/05/2014 1039   CO2 27 11/27/2014 1339   BUN 11.7 12/05/2014 1039   BUN 12 11/27/2014 1339   CREATININE 0.8 12/05/2014 1039   CREATININE 0.80 11/27/2014 1339   CREATININE 0.80 05/15/2014 0500      Component Value Date/Time   CALCIUM 9.1 12/05/2014 1039   CALCIUM 9.5 11/27/2014 1339   ALKPHOS 91 12/05/2014 1039   ALKPHOS 88 11/27/2014 1339   AST 21 12/05/2014 1039   AST 24 11/27/2014 1339   ALT 20 12/05/2014 1039   ALT 22 11/27/2014 1339   BILITOT 0.39 12/05/2014 1039   BILITOT 0.5 11/27/2014 1339       Lab Results  Component Value Date   WBC 4.5 12/05/2014     HGB 12.2 12/05/2014   HCT 36.0 12/05/2014   MCV 92.1 12/05/2014   PLT 216 12/05/2014   NEUTROABS 2.9 12/05/2014     ASSESSMENT & PLAN:  Breast cancer of lower-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 status post lumpectomy, Oncotype DX recurrence score 29 intermediate risk 19% risk of recurrence with hormonal therapy alone  Chemotherapy summary: Taxotere and Cytoxan 4 started 05/07/2014 completed 07/09/2014 Long-term chemotherapy toxicities: Neuropathy Radiation therapy: Started 08/07/2014 completed 09/16/14  Anastrozole 1 mg daily started 10/04/14 switched to letrozole 09/16/2015 Anastrozole Toxicities: 1. Severe pain in bilateral hips 2. Hot flashes: They have gotten significantly  worse. We decided to change treatment from anastrozole to letrozole.  Breast Cancer Surveillance: 1. Breast exam  09/16/2015: Normal 2. Mammogram 03/07/2015 No abnormalities. Postsurgical changes. Breast Density Category  C. I recommended that she get 3-D mammograms for surveillance. Discussed the differences between different breast density categories. Patient plans to drive to [place] hopefully in the summer or fall and is looking forward to it.  RTC in 6 months   No orders of the defined types were placed in this encounter.   The patient has a good understanding of the overall plan. she agrees with it. she will call with any problems that may develop before the next visit here.   Gudena, Vinay K, MD 09/16/2015      

## 2015-12-05 ENCOUNTER — Ambulatory Visit: Payer: Medicare Other | Admitting: Cardiovascular Disease

## 2015-12-09 ENCOUNTER — Telehealth: Payer: Self-pay | Admitting: Cardiovascular Disease

## 2015-12-09 NOTE — Telephone Encounter (Signed)
Received records from [facility] for appointment on 12/16/15 with Dr Claiborne Billings.  Records given to [facility] (medical records) for Dr Evette Georges schedule on 12/16/15. lp

## 2015-12-16 ENCOUNTER — Encounter: Payer: Self-pay | Admitting: Cardiovascular Disease

## 2015-12-16 ENCOUNTER — Ambulatory Visit (INDEPENDENT_AMBULATORY_CARE_PROVIDER_SITE_OTHER): Payer: Medicare Other | Admitting: Cardiovascular Disease

## 2015-12-16 VITALS — BP 100/64 | HR 54 | Ht 64.5 in | Wt 157.5 lb

## 2015-12-16 DIAGNOSIS — I10 Essential (primary) hypertension: Secondary | ICD-10-CM

## 2015-12-16 DIAGNOSIS — I440 Atrioventricular block, first degree: Secondary | ICD-10-CM

## 2015-12-16 DIAGNOSIS — M25471 Effusion, right ankle: Secondary | ICD-10-CM

## 2015-12-16 DIAGNOSIS — I480 Paroxysmal atrial fibrillation: Secondary | ICD-10-CM

## 2015-12-16 DIAGNOSIS — I471 Supraventricular tachycardia: Secondary | ICD-10-CM

## 2015-12-16 DIAGNOSIS — M25472 Effusion, left ankle: Secondary | ICD-10-CM

## 2015-12-16 DIAGNOSIS — E785 Hyperlipidemia, unspecified: Secondary | ICD-10-CM

## 2015-12-16 MED ORDER — METOPROLOL SUCCINATE ER 50 MG PO TB24: ORAL_TABLET | ORAL | Status: DC

## 2015-12-16 NOTE — Patient Instructions (Signed)
Your physician recommends that you return for lab work fasting.  Your physician has recommended you make the following change in your medication:   1.) the metoprolol has been decreased to 25 mg daily (1/2 tablet)  Your physician wants you to follow-up in: 1 year or sooner if needed. You will receive a reminder letter in the mail two months in advance. If you don't receive a letter, please call our office to schedule the follow-up appointment.  If you need a refill on your cardiac medications before your next appointment, please call your pharmacy.

## 2015-12-18 ENCOUNTER — Encounter: Payer: Self-pay | Admitting: Cardiovascular Disease

## 2015-12-18 DIAGNOSIS — I440 Atrioventricular block, first degree: Secondary | ICD-10-CM | POA: Insufficient documentation

## 2015-12-18 DIAGNOSIS — M25471 Effusion, right ankle: Secondary | ICD-10-CM | POA: Insufficient documentation

## 2015-12-18 DIAGNOSIS — M25472 Effusion, left ankle: Secondary | ICD-10-CM

## 2015-12-18 LAB — CBC
HCT: 39.5 % (ref 35.0–45.0)
Hemoglobin: 13 g/dL (ref 11.7–15.5)
MCH: 31.3 pg (ref 27.0–33.0)
MCHC: 32.9 g/dL (ref 32.0–36.0)
MCV: 95 fL (ref 80.0–100.0)
MPV: 9.2 fL (ref 7.5–12.5)
PLATELETS: 261 10*3/uL (ref 140–400)
RBC: 4.16 MIL/uL (ref 3.80–5.10)
RDW: 13.6 % (ref 11.0–15.0)
WBC: 4.8 10*3/uL (ref 3.8–10.8)

## 2015-12-18 LAB — TSH: TSH: 1.8 m[IU]/L

## 2015-12-18 NOTE — Progress Notes (Signed)
Patient ID: Jacelyn Cuen, female   DOB: October 29, 1950, 65 y.o.   MRN: [id number]    HPI: Ms. Lynea Rollison is a 65 year old African American female who is a former patient of Dr. Rollene Fare. She presents for a 66-monthcardiology evaluation.  Ms. AHapkehas a history of palpitations, paroxysmal supraventricular tachycardia as well as paroxysmal atrial fibrillation which have been detected on remote monitoring. She has been treated with low-dose flecainide 50 mg twice a day in addition to metoprolol succinate 50 mg daily. She also has a history of hyperlipidemia in the past has had LDLs in the 145 range but is intolerant to several statins including Crestor. She also states she developed some myalgias secondary to Zetia. When she was last seen by Dr. WRollene Farehe had written her a prescription for livalo 1 mg but she never took this.  She also has continued problems with low back and hip discomfort and has some sciatica issues. She is followed by Dr. RMayer Camel She also has undergone MRIs of her back.   An echo Doppler study in February 2012 showed normal systolic and diastolic function. She did not have significant valvular pathology. A nuclear perfusion study in 2010 which was normal.  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  I saw her prior to undergoing her lumpectomy and prior to radiation and chemotherapy.  A subsequent 2-D echo Doppler study on 03/28/2014 showed an ejection fraction at 60-65% and was normal.  She tolerated lumpectomy well and completed chemotherapy and radiation and now is on letrozole for planned 7 years.   She denies recent episodes of chest pain.  Her palpitations are controlled.  She is unaware of any breakthrough atrial fibrillation.  She has not had recent laboratory.  She  presents for evaluation.   Past Medical History  Diagnosis Date  . Heart palpitations   . Osteoporosis   . Asthma   . SVT (supraventricular tachycardia) ([place]   . Numbness of arm     Right Arm  . Irregular heartbeat   . Anxiety   . Arthritis   . Atrial fibrillation ([place]   . Heartburn   . Hyperlipidemia   . Pure hypercholesterolemia   . Family history of malignant neoplasm of breast   . Anemia     as a child  . Dysrhythmia     hx AF  . Malignant neoplasm of breast (female), unspecified site   . Invasive ductal carcinoma of right breast ([place] 02/08/14    Lower Inner Quadrant    Past Surgical History  Procedure Laterality Date  . Abdominal hysterectomy  1987  . Cystoscopy  04/25/2012    Pt may be a candidate for research anti-incontinence procedure protocol  . Doppler echocardiography  08/21/2010    Ejecion Fraction =>55% All chambers are normal in size and function No significant change from 2010.  . Dipyridamole myoview  05/27/2009    The post stress myocardial perfusion images show a normal pattern of perfusion in all regions.The post stress ejection fraction is 83 %. Normal  myocardial perfusion imaging This is a low risk scan.  .Marland KitchenUpper arterial dopplers  06/02/2011    Right Side PPG Demonstrates siginificantly diminshed waveforms with the Adson's maneuver which may suggest Thoracic Outlet Syndrone. This is a abnormal thoracic outlet arterial evaluation.  . Lower  venous dopplers  04/11/2009    Normal bilateral lower ext venous duplex  . 30 day monitor  04/10/2009 to 05/23/2009    No End Of Summary Report Atrial Fib,Narrow Complex Tachycardia,Sinus Rhythm   . Breast surgery      Left  . Breast lumpectomy with needle localization and axillary sentinel lymph node bx Right 04/02/2014    Procedure: Right Axillary Lymphatic Mapping; Right axillary Sentinal Node Biopsy; Right Partial Mastectomy after wire localization;  Surgeon: Jackolyn Confer, MD;  Location: [facility];  Service:  General;  Laterality: Right;  . Portacath placement N/A 04/26/2014    Procedure: ULTRASOUND GUIDED PORT A CATHE INSERTION;  Surgeon: Jackolyn Confer, MD;  Location: [facility];  Service: General;  Laterality: N/A;    Allergies  Allergen Reactions  . Latex Swelling and Rash    Skin swelling   . Sulfonamide Derivatives Anaphylaxis, Shortness Of Breath, Nausea Only and Other (See Comments)    Sweating, headache   . Clarithromycin Nausea Only and Other (See Comments)    Headache   . Crestor [Rosuvastatin] Other (See Comments)    Muscle aches, pain, weakness in lower extremities  . Statins Other (See Comments)    Muscle soreness and weakness w/ Rosuvastatin and Zetia.  . Ciprofloxacin Hcl Nausea Only, Palpitations and Other (See Comments)    Headache     Current Outpatient Prescriptions  Medication Sig Dispense Refill  . amitriptyline (ELAVIL) 25 MG tablet Take 25 mg by mouth at bedtime as needed for sleep. When patient has migraines    . aspirin EC 81 MG tablet Take 81 mg by mouth daily.    . Cholecalciferol (VITAMIN D3) 10000 units capsule Take 10,000 Units by mouth 2 (two) times daily.    . flecainide (TAMBOCOR) 100 MG tablet TAKE 0.5 TABLETS (50 MG TOTAL) BY MOUTH 2 (TWO) TIMES DAILY. 90 tablet 3  . ibuprofen (ADVIL,MOTRIN) 200 MG tablet Take 200 mg by mouth every 6 (six) hours as needed for headache or moderate pain.    [name] letrozole (FEMARA) 2.5 MG tablet Take 1 tablet (2.5 mg total) by mouth daily. 30 tablet 2  . Meth-Hyo-M Bl-Na Phos-Ph Sal (URIBEL) 118 MG CAPS Take 118 mg by mouth 4 (four) times daily as needed (for bladder). Reported on 09/09/2015  2  . metoprolol succinate (TOPROL-XL) 50 MG 24 hr tablet Take 1/2 tablet daily 30 tablet 2  . PARoxetine (PAXIL) 20 MG tablet Take 1 tablet (20 mg total) by mouth daily. 90 tablet 3  . prochlorperazine (COMPAZINE) 10 MG tablet Take 1 tablet (10 mg total) by mouth every 6 (six) hours as needed (Nausea or vomiting). 30 tablet 1  .  spironolactone (ALDACTONE) 25 MG tablet Take 1 tablet (25 mg total) by mouth daily. 30 tablet 3   No current facility-administered medications for this visit.    Social history is notable in that she is married and has 2 children and 3 grandchildren. 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 There is no tobacco or alcohol use.   Family History  Problem Relation Age of Onset  . Cancer Mother     skin cancer  . Diabetes Sister     Borderline  . Hypertension Sister   . Heart attack Sister   . Breast cancer Sister 32    maternal half-sister; deceased 86  . Diabetes Brother     Borderline; maternal half-brother  . Cancer Maternal Aunt     stomach and  throat; heavy smoker and drinker; deceased 55  . Cancer Cousin 72    unk. primary; daughter of mat aunt with stomach/throat ca  . Cancer Other     breast/ovarian; daughter of mat cousin with unk. primary at 51; pt's 1st cousin-once-removed    ROS General: Negative; No fevers, chills, or night sweats;  HEENT: Negative; No changes in vision or hearing, sinus congestion, difficulty swallowing Pulmonary: Negative; No cough, wheezing, shortness of breath, hemoptysis Cardiovascular: Negative; No chest pain, presyncope, syncope, palpitations Positive for occasional ankle swelling GI: Negative; No nausea, vomiting, diarrhea, or abdominal pain GU: Negative; No dysuria, hematuria, or difficulty voiding Musculoskeletal: Negative; no myalgias, joint pain, or weakness Hematologic/Oncology: As above, positive for invasive ductal carcinoma of the right breast .  Status post chemotherapy and radiation treatment following lumpectomy Endocrine: Negative; no heat/cold intolerance; no diabetes Neuro: Negative; no changes in balance, headaches Skin: Negative; No rashes or skin lesions Psychiatric: Negative; No behavioral problems, depression Sleep: Negative; No snoring, daytime sleepiness, hypersomnolence,  bruxism, restless legs, hypnogognic hallucinations, no cataplexy Other comprehensive 14 point system review is negative.   PE BP 100/64 mmHg  Pulse 54  Ht 5' 4.5" (1.638 m)  Wt 157 lb 8 oz (71.442 kg)  BMI 26.63 kg/m2  LMP 07/06/1985   Repeat blood pressure by me was 130/68.  The right arm and 130/66 in the left arm.  Wt Readings from Last 3 Encounters:  12/16/15 157 lb 8 oz (71.442 kg)  09/16/15 157 lb 4.8 oz (71.351 kg)  09/09/15 158 lb 12.8 oz (72.031 kg)   General: Alert, oriented, no distress.  Skin: normal turgor, no rashes HEENT: Normocephalic, atraumatic. Pupils round and reactive; sclera anicteric; Fundi no hemorrhages or exudates arteriolar narrowing Nose without nasal septal hypertrophy Mouth/Parynx benign; Mallinpatti scale 3 Neck: No JVD, no carotid bruits with normal carotid upstroke Lungs: clear to ausculatation and percussion; no wheezing or rales Chest wall: Nontender to palpation Heart: RRR, s1 s2 normal 1/6 systolic murmur, no ectopy; no S3 or S4 gallop.  No rubs thrills or heaves. Abdomen: soft, nontender; no hepatosplenomehaly, BS+; abdominal aorta nontender and not dilated by palpation. Back: No CVA tenderness Pulses 2+ Extremities: no clubbing cyanosis or edema, Homan's sign negative  Neurologic: grossly nonfocal Psychologic: Normal mood and affect  ECG (independently read by me): Sinus bradycardia 55 bpm.  First-degree AV block with a PR interval at 250 ms.  Nonspecific ST-T changes.  April 2016 ECG (independently read by me): Normal sinus rhythm at 63 bpm.  First-degree AV block with a PR interval at 254 ms.  QTc interval 49 ms.  Nonspecific T changes.  September 2015 ECG sinus rhythm with first-degree AV block.  Nonspecific T changes V1 through V3.  Prior ECG: Sinus rhythm at 57 beats per minute, with first degree AV block with PR interval 252 ms. QTc interval is normal at 389 ms. Nonspecific T changes.  LABS:  BMP Latest Ref Rng 12/05/2014  11/27/2014 09/11/2014  Glucose 70 - 140 mg/dl 83 93 89  BUN 7.0 - 26.0 mg/dL 11.7 12 8.0  Creatinine 0.6 - 1.1 mg/dL 0.8 0.80 0.8  Sodium 136 - 145 mEq/L 141 140 141  Potassium 3.5 - 5.1 mEq/L 4.0 4.8 4.0  Chloride 96 - 112 mEq/L - 106 -  CO2 22 - 29 mEq/L 27 27 24   Calcium 8.4 - 10.4 mg/dL 9.1 9.5 9.3     Hepatic Function Latest Ref Rng 12/05/2014 11/27/2014 09/11/2014  Total Protein 6.4 - 8.3 g/dL  6.8 7.1 7.1  Albumin 3.5 - 5.0 g/dL 3.5 4.2 3.8  AST 5 - 34 U/L 21 24 25   ALT 0 - 55 U/L 20 22 23   Alk Phosphatase 40 - 150 U/L 91 88 76  Total Bilirubin 0.20 - 1.20 mg/dL 0.39 0.5 0.28     CBC Latest Ref Rng 12/05/2014 09/11/2014 07/17/2014  WBC 3.9 - 10.3 10e3/uL 4.5 3.2(L) 2.2(L)  Hemoglobin 11.6 - 15.9 g/dL 12.2 12.3 9.9(L)  Hematocrit 34.8 - 46.6 % 36.0 36.7 29.9(L)  Platelets 145 - 400 10e3/uL 216 184 317   Lab Results  Component Value Date   MCV 92.1 12/05/2014   MCV 92.7 09/11/2014   MCV 92.9 07/17/2014    Lab Results  Component Value Date   TSH 0.746 11/27/2014   No results found for: HGBA1C   Lipid Panel     Component Value Date/Time   CHOL 223* 11/27/2014 1339   CHOL 237* 06/22/2013 1405   TRIG 80 11/27/2014 1339   TRIG 103 06/22/2013 1405   HDL 75 11/27/2014 1339   HDL 74 06/22/2013 1405   CHOLHDL 3.0 11/27/2014 1339   VLDL 16 11/27/2014 1339   LDLCALC 132* 11/27/2014 1339   LDLCALC 142* 06/22/2013 1405     BNP No results found for: PROBNP  Lipid Panel     Component Value Date/Time   CHOL 223* 11/27/2014 1339   CHOL 237* 06/22/2013 1405   TRIG 80 11/27/2014 1339   TRIG 103 06/22/2013 1405   HDL 75 11/27/2014 1339   HDL 74 06/22/2013 1405   CHOLHDL 3.0 11/27/2014 1339   VLDL 16 11/27/2014 1339   LDLCALC 132* 11/27/2014 1339   LDLCALC 142* 06/22/2013 1405     RADIOLOGY: No results found.   ASSESSMENT AND PLAN: Ms. Shishido is a 65 year old African-American female who has a history of PSVT/PAF which has been  controlled with low-dose flecainide 50  twice a day combined with beta blocker therapy. She denies any recent chest pain. She does have nonspecific ST changes on her ECG.  Her echo Doppler study done prior to chemotherapy was entirely normal with an ejection fraction of 60-65%.  Presently, her blood pressure is stable, although when taken originally by the nurse was low.  She is bradycardic and has first-degree AV block.  She is unaware of any breakthrough episodes of PSVT or atrial fibrillation.  I have recommended she continue for a night at her current dose of 50 mg twice a day.  However, I am reducing her Toprol-XL dose from 50 mg to 25 mg.  She has noted occasional episodes of mild ankle swelling.  I have suggested that if she does experience this intermittently.  She can take an extra 12.5 mg of spironolactone.  I am recommending a complete set of fasting laboratory be obtained.  She has a history of hyperlipidemia and in the past, she did not tolerate statin therapy. Adjustments will be made to her medical regimen upon completion of her blood work.  I will see her in one year for reevaluation.  Time spent: 25 minutes  Troy Sine, MD, [facility] 12/18/2015 9:23 AM

## 2015-12-19 LAB — LIPID PANEL
Cholesterol: 233 mg/dL — ABNORMAL HIGH (ref 125–200)
HDL: 100 mg/dL (ref >=46)
LDL Cholesterol: 115 mg/dL (ref <130)
TRIGLYCERIDES: 89 mg/dL (ref <150)
Total CHOL/HDL Ratio: 2.3 Ratio (ref <=5.0)
VLDL: 18 mg/dL (ref <30)

## 2015-12-19 LAB — COMPREHENSIVE METABOLIC PANEL
ALK PHOS: 96 U/L (ref 33–130)
ALT: 15 U/L (ref 6–29)
AST: 16 U/L (ref 10–35)
Albumin: 4.3 g/dL (ref 3.6–5.1)
BILIRUBIN TOTAL: 0.5 mg/dL (ref 0.2–1.2)
BUN: 7 mg/dL (ref 7–25)
CO2: 24 mmol/L (ref 20–31)
CREATININE: 0.77 mg/dL (ref 0.50–0.99)
Calcium: 9.8 mg/dL (ref 8.6–10.4)
Chloride: 107 mmol/L (ref 98–110)
Glucose, Bld: 93 mg/dL (ref 65–99)
Potassium: 4.4 mmol/L (ref 3.5–5.3)
SODIUM: 142 mmol/L (ref 135–146)
TOTAL PROTEIN: 7.4 g/dL (ref 6.1–8.1)

## 2015-12-22 ENCOUNTER — Other Ambulatory Visit: Payer: Self-pay | Admitting: Hematology and Oncology

## 2015-12-22 ENCOUNTER — Other Ambulatory Visit: Payer: Self-pay | Admitting: Cardiovascular Disease

## 2015-12-24 ENCOUNTER — Other Ambulatory Visit: Payer: Self-pay | Admitting: Hematology and Oncology

## 2015-12-31 ENCOUNTER — Telehealth: Payer: Self-pay | Admitting: *Deleted

## 2015-12-31 ENCOUNTER — Other Ambulatory Visit: Payer: Self-pay | Admitting: *Deleted

## 2015-12-31 DIAGNOSIS — E785 Hyperlipidemia, unspecified: Secondary | ICD-10-CM

## 2015-12-31 DIAGNOSIS — Z79899 Other long term (current) drug therapy: Secondary | ICD-10-CM

## 2015-12-31 NOTE — Telephone Encounter (Signed)
ok 

## 2015-12-31 NOTE — Telephone Encounter (Signed)
-----   Message from Troy Sine, MD sent at 12/22/2015  8:47 AM EDT ----- Labs good x chol 233; if cannot take a statin consider zetia 10 mg daily; if prefers not any therapy, then re-check lipids in 4-6 months after stricter diet.

## 2015-12-31 NOTE — Telephone Encounter (Signed)
Spoke with patient . She states that she has tried zetia in the past. She has gotten some "cholest off" OTC to try. She prefers try this first along with a stricter diet and recheck labs at a later date. Labs reordered for November 2017.

## 2016-01-19 ENCOUNTER — Telehealth: Payer: Self-pay

## 2016-01-19 DIAGNOSIS — C50311 Malignant neoplasm of lower-inner quadrant of right female breast: Secondary | ICD-10-CM

## 2016-01-19 MED ORDER — ESOMEPRAZOLE MAGNESIUM 40 MG PO CPDR: 40.0000 mg | DELAYED_RELEASE_CAPSULE | Freq: Every day | ORAL | Status: DC

## 2016-01-19 NOTE — Telephone Encounter (Signed)
Pt called asking if Dr Lindi Adie would rx nexium. She stated that the nexium helps her stomach when she has to take a lot of pain medication and Dr Lindi Adie had Rx nexium in the past. Nexium order noted 06/25/14.

## 2016-01-19 NOTE — Addendum Note (Signed)
Addended by: Prentiss Bells on: 01/19/2016 03:55 PM   Modules accepted: Orders

## 2016-01-25 ENCOUNTER — Other Ambulatory Visit: Payer: Self-pay | Admitting: Hematology and Oncology

## 2016-01-26 NOTE — Telephone Encounter (Signed)
Chart reviewed.

## 2016-02-04 ENCOUNTER — Telehealth: Payer: Self-pay

## 2016-02-04 ENCOUNTER — Other Ambulatory Visit: Payer: Self-pay | Admitting: Hematology and Oncology

## 2016-02-04 DIAGNOSIS — Z853 Personal history of malignant neoplasm of breast: Secondary | ICD-10-CM

## 2016-02-04 NOTE — Telephone Encounter (Signed)
Received VM from pt requesting a call back with upcoming appt date and time.  Attempted to contact pt but unable to reach.  Left VM with date and time of upcoming mammogram and office visit with Dr. Lindi Adie.

## 2016-03-03 ENCOUNTER — Other Ambulatory Visit: Payer: Self-pay | Admitting: Cardiovascular Disease

## 2016-03-04 ENCOUNTER — Other Ambulatory Visit: Payer: Self-pay | Admitting: Cardiovascular Disease

## 2016-03-04 MED ORDER — FLECAINIDE ACETATE 100 MG PO TABS: 50.0000 mg | ORAL_TABLET | Freq: Two times a day (BID) | ORAL | 2 refills | Status: DC

## 2016-03-09 ENCOUNTER — Ambulatory Visit
Admission: RE | Admit: 2016-03-09 | Discharge: 2016-03-09 | Disposition: A | Discharge status: Home / Self Care | Payer: Medicare Other | Source: Ambulatory Visit | Attending: Hematology and Oncology | Admitting: Hematology and Oncology

## 2016-03-09 DIAGNOSIS — Z853 Personal history of malignant neoplasm of breast: Secondary | ICD-10-CM

## 2016-03-18 ENCOUNTER — Encounter: Payer: Self-pay | Admitting: Medical Oncology

## 2016-03-18 ENCOUNTER — Ambulatory Visit (HOSPITAL_BASED_OUTPATIENT_CLINIC_OR_DEPARTMENT_OTHER): Payer: Medicare Other | Admitting: Hematology and Oncology

## 2016-03-18 ENCOUNTER — Encounter: Payer: Self-pay | Admitting: Hematology and Oncology

## 2016-03-18 DIAGNOSIS — C50311 Malignant neoplasm of lower-inner quadrant of right female breast: Secondary | ICD-10-CM

## 2016-03-18 DIAGNOSIS — Z79811 Long term (current) use of aromatase inhibitors: Secondary | ICD-10-CM | POA: Diagnosis not present

## 2016-03-18 DIAGNOSIS — Z170 Estrogen receptor positive status [ER+]: Secondary | ICD-10-CM | POA: Diagnosis not present

## 2016-03-18 MED ORDER — VENLAFAXINE HCL ER 37.5 MG PO CP24: 37.5000 mg | ORAL_CAPSULE | Freq: Every day | ORAL | 6 refills | Status: DC

## 2016-03-18 NOTE — Progress Notes (Signed)
Patient Care Team: [name], MD as PCP - General (Family Medicine) Terance Ice, MD (Cardiology) Carolan Clines, MD as Attending Physician (Urology)  DIAGNOSIS: Breast cancer of lower-inner quadrant of right female breast [facility])   Staging form: Breast, AJCC 7th Edition   - Clinical: Stage IA (T1b, N0, cM0) - Signed by Rulon Eisenmenger, MD on 02/26/2014   - Pathologic: Stage IA (T1c, N0, cM0) - Signed by Nicholas Lose, MD on 09/12/2014  SUMMARY OF ONCOLOGIC HISTORY:   Breast cancer of lower-inner quadrant of right female breast ([place])   01/18/2014 Mammogram    U/S Breast: 7 x 7 x 9 mm irregular taller than wide hypoechoic mass with a peripheral echogenic rim in the right breast 4 o'clock position 7 cm from the nipple. No right axillary lymphadenopathy.       02/14/2014 Initial Diagnosis    Breast cancer-right Er 100%, PR 94%, Ki 67: 17%; Her2 Neg ratio 1.74; T1bN0Mo (Clinical Stage 1A)      04/02/2014 Surgery    Right breast lumpectomy: Invasive ductal carcinoma negative for lymph vascular invasion, 1.6 cm, grade 2, 3 SLN negative, reexcision margins benign, ER 100%, PR 94%, HER-2 negative Ki-67 70% T1 C. N0 M0 stage IA: Oncotype DX 29 (19%ROR)      05/07/2014 - 07/09/2014 Chemotherapy    Taxotere Cytoxan adjuvant chemotherapy every 3 weeks x4 cycles (after cycle one hospitalization for pain related to Neulasta and severe gastritis due to steroids)      08/07/2014 - 09/16/2014 Radiation Therapy    Adjuvant radiation therapy      10/04/2014 -  Anti-estrogen oral therapy    Anastrozole 1 mg daily changed to letrozole 09/16/2015 due to hot flashes       CHIEF COMPLIANT: Follow-up on letrozole  INTERVAL HISTORY: Tami [name] is a 65 year [name] with above-mentioned history of right breast cancer who is currently on letrozole therapy. She is tolerating it significantly better than anastrozole. She does have occasional hot flashes once every 2-3 days. Denies any myalgias. She has  not been exercising much because she has been feeling depressed. She reports of the Paxil is not helping.  REVIEW OF SYSTEMS:   Constitutional: Denies fevers, chills or abnormal weight loss Eyes: Denies blurriness of vision Ears, nose, mouth, throat, and face: Denies mucositis or sore throat Respiratory: Denies cough, dyspnea or wheezes Cardiovascular: Denies palpitation, chest discomfort Gastrointestinal:  Denies nausea, heartburn or change in bowel habits Skin: Denies abnormal skin rashes Lymphatics: Denies new lymphadenopathy or easy bruising Neurological:Denies numbness, tingling or new weaknesses Behavioral/Psych: Depression Extremities: No lower extremity edema Breast:  denies any pain or lumps or nodules in either breasts All other systems were reviewed with the patient and are negative.  I have reviewed the past medical history, past surgical history, social history and family history with the patient and they are unchanged from previous note.  ALLERGIES:  is allergic to latex; sulfonamide derivatives; clarithromycin; crestor [rosuvastatin]; statins; and ciprofloxacin hcl.  MEDICATIONS:  Current Outpatient Prescriptions  Medication Sig Dispense Refill  . amitriptyline (ELAVIL) 25 MG tablet Take 25 mg by mouth at bedtime as needed for sleep. When patient has migraines    . aspirin EC 81 MG tablet Take 81 mg by mouth daily.    . Cholecalciferol (VITAMIN D3) 10000 units capsule Take 10,000 Units by mouth 2 (two) times daily.    [name] esomeprazole (NEXIUM) 40 MG capsule Take 1 capsule (40 mg total) by mouth daily at 12 noon. 30 capsule 2  .  flecainide (TAMBOCOR) 100 MG tablet Take 0.5 tablets (50 mg total) by mouth 2 (two) times daily. 90 tablet 2  . ibuprofen (ADVIL,MOTRIN) 200 MG tablet Take 200 mg by mouth every 6 (six) hours as needed for headache or moderate pain.    [name] letrozole (FEMARA) 2.5 MG tablet TAKE 1 TABLET BY MOUTH DAILY 30 tablet 2  . Meth-Hyo-M Bl-Na Phos-Ph Sal  (URIBEL) 118 MG CAPS Take 118 mg by mouth 4 (four) times daily as needed (for bladder). Reported on 09/09/2015  2  . metoprolol succinate (TOPROL-XL) 50 MG 24 hr tablet TAKE 1 TABLET BY MOUTH DAILY. TAKE WITH OR IMMEDIATELY FOLLOWING A MEAL. 90 tablet 3  . prochlorperazine (COMPAZINE) 10 MG tablet Take 1 tablet (10 mg total) by mouth every 6 (six) hours as needed (Nausea or vomiting). 30 tablet 1  . spironolactone (ALDACTONE) 25 MG tablet Take 1 tablet (25 mg total) by mouth daily. 30 tablet 3  . venlafaxine XR (EFFEXOR-XR) 37.5 MG 24 hr capsule Take 1 capsule (37.5 mg total) by mouth daily with breakfast. 30 capsule 6   No current facility-administered medications for this visit.     PHYSICAL EXAMINATION: ECOG PERFORMANCE STATUS: 1 - Symptomatic but completely ambulatory  Vitals:   03/18/16 1129  BP: (!) 158/79  Pulse: 66  Resp: 18  Temp: 98.6 F (37 C)   Filed Weights   03/18/16 1129  Weight: 161 lb 1.6 oz (73.1 kg)    GENERAL:alert, no distress and comfortable SKIN: skin color, texture, turgor are normal, no rashes or significant lesions EYES: normal, Conjunctiva are pink and non-injected, sclera clear OROPHARYNX:no exudate, no erythema and lips, buccal mucosa, and tongue normal  NECK: supple, thyroid normal size, non-tender, without nodularity LYMPH:  no palpable lymphadenopathy in the cervical, axillary or inguinal LUNGS: clear to auscultation and percussion with normal breathing effort HEART: regular rate & rhythm and no murmurs and no lower extremity edema ABDOMEN:abdomen soft, non-tender and normal bowel sounds MUSCULOSKELETAL:no cyanosis of digits and no clubbing  NEURO: alert & oriented x 3 with fluent speech, no focal motor/sensory deficits, Cognitive dysfunction is present. EXTREMITIES: No lower extremity edema   LABORATORY DATA:  I have reviewed the data as listed   Chemistry      Component Value Date/Time   NA 142 12/16/2015 1052   NA 141 12/05/2014 1039   K  4.4 12/16/2015 1052   K 4.0 12/05/2014 1039   CL 107 12/16/2015 1052   CO2 24 12/16/2015 1052   CO2 27 12/05/2014 1039   BUN 7 12/16/2015 1052   BUN 11.7 12/05/2014 1039   CREATININE 0.77 12/16/2015 1052   CREATININE 0.8 12/05/2014 1039      Component Value Date/Time   CALCIUM 9.8 12/16/2015 1052   CALCIUM 9.1 12/05/2014 1039   ALKPHOS 96 12/16/2015 1052   ALKPHOS 91 12/05/2014 1039   AST 16 12/16/2015 1052   AST 21 12/05/2014 1039   ALT 15 12/16/2015 1052   ALT 20 12/05/2014 1039   BILITOT 0.5 12/16/2015 1052   BILITOT 0.39 12/05/2014 1039       Lab Results  Component Value Date   WBC 4.8 12/16/2015   HGB 13.0 12/16/2015   HCT 39.5 12/16/2015   MCV 95.0 12/16/2015   PLT 261 12/16/2015   NEUTROABS 2.9 12/05/2014     ASSESSMENT & PLAN:  Breast cancer of lower-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  N0 M0 stage IA status post lumpectomy, Oncotype DX recurrence score 29 intermediate risk 19% risk of recurrence with hormonal therapy alone  Chemotherapy summary: Taxotere and Cytoxan 4 started 05/07/2014 completed 07/09/2014 Long-term chemotherapy toxicities: Neuropathy Radiation therapy: Started 08/07/2014 completed 09/16/14  Anastrozole 1 mg daily started 10/04/14 switched to letrozole 09/16/2015 Letrozole Toxicities: 1. Depression: I will switch her treatment from Paxil to Effexor.  2. Hot flashes: Much better on letrozole.  Cognitive dysfunction: We discussed the importance of physical exercise, eating healthy, reading books and puzzles. I encouraged her to participate in cognitive dysfunction clinical trial cc CW [id number] randomized between donepezil and placebo for 24 weeks. Patient is interested in this trial.  Breast Cancer Surveillance: 1. Breast exam  09/16/2015: Normal 2. Mammogram 03/09/2016 No abnormalities. Postsurgical changes. Breast Density Category  C. I  recommended that she get 3-D mammograms for surveillance. Discussed the differences between different breast density categories.  RTC in 6 months   No orders of the defined types were placed in this encounter.  The patient has a good understanding of the overall plan. she agrees with it. she will call with any problems that may develop before the next visit here.   Rulon Eisenmenger, MD 03/18/16

## 2016-03-18 NOTE — Assessment & Plan Note (Signed)
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 status post lumpectomy, Oncotype DX recurrence score 29 intermediate risk 19% risk of recurrence with hormonal therapy alone  Chemotherapy summary: Taxotere and Cytoxan 4 started 05/07/2014 completed 07/09/2014 Long-term chemotherapy toxicities: Neuropathy Radiation therapy: Started 08/07/2014 completed 09/16/14  Anastrozole 1 mg daily started 10/04/14 switched to letrozole 09/16/2015 Letrozole Toxicities: 1. Depression: I will switch her treatment from Paxil to Effexor.  2. Hot flashes: Much better on letrozole.  Cognitive dysfunction: We discussed the importance of physical exercise, eating healthy, reading books and puzzles. I encouraged her to participate in cognitive dysfunction clinical trial cc CW [id number] randomized between donepezil and placebo for 24 weeks. Patient is interested in this trial.  Breast Cancer Surveillance: 1. Breast exam  09/16/2015: Normal 2. Mammogram 03/09/2016 No abnormalities. Postsurgical changes. Breast Density Category  C. I recommended that she get 3-D mammograms for surveillance. Discussed the differences between different breast density categories. Patient plans to drive to [place] hopefully in the summer or fall and is looking forward to it.  RTC in 6 months

## 2016-03-26 ENCOUNTER — Encounter: Payer: Self-pay | Admitting: Physician Assistant

## 2016-03-26 DIAGNOSIS — M6530 Trigger finger, unspecified finger: Secondary | ICD-10-CM | POA: Insufficient documentation

## 2016-04-21 ENCOUNTER — Telehealth: Payer: Self-pay | Admitting: *Deleted

## 2016-04-21 ENCOUNTER — Other Ambulatory Visit: Payer: Self-pay

## 2016-04-21 DIAGNOSIS — E860 Dehydration: Secondary | ICD-10-CM

## 2016-04-21 DIAGNOSIS — R197 Diarrhea, unspecified: Secondary | ICD-10-CM

## 2016-04-21 NOTE — Telephone Encounter (Signed)
Called Tami [name] back and she reports she started on Venlafaxine about 2 weeks ago.  She started having nausea, diarrhea, headaches, dizziness, feeling off balance and decreased appetite a few days after starting it.  Symptoms got worse so she decreased to taking every other day about one week ago.  Symptoms continued, not improved, so she stopped taking it altogether about 3 days ago.  Symptoms still not improved.  States has diarrhea every time she eats.  She has not taken medication for her diarrhea.  She sent her daughter today to buy Pepto Bismol.  Instructed Tami [name] to continue to hold the Venlafaxine.  Do not take Pepto Bismol b/c it contains Aspirin.   She can take imodium OTC as directed for diarrhea.  Drink a lot of fluids.  Will notify Dr. Lindi Adie of symptoms and call her back.  Tami [name] verbalized understanding and also says the medication did actually help her mood and she has been less depressed but can't tolerate the physical side effects.

## 2016-04-21 NOTE — Telephone Encounter (Signed)
Dr. Lindi Adie wants pt to come in for lab and to see a Provider tomorrow.  His nurse is sending order to Scheduling to add on tomorrow for lab at 1:30 pm and Mikey Bussing, NP at 2 pm.   Notified pt of appts for tomorrow and she verbalized understanding.

## 2016-04-21 NOTE — Telephone Encounter (Signed)
Pt left VM states she thinks she is allergic to Venlafaxine.  She c/o symptoms of nausea, decreased appetite, diarrhea, dizziness and head aches.

## 2016-04-22 ENCOUNTER — Other Ambulatory Visit (HOSPITAL_BASED_OUTPATIENT_CLINIC_OR_DEPARTMENT_OTHER): Payer: Medicare Other

## 2016-04-22 ENCOUNTER — Ambulatory Visit (HOSPITAL_BASED_OUTPATIENT_CLINIC_OR_DEPARTMENT_OTHER): Payer: Medicare Other | Admitting: Oncology

## 2016-04-22 VITALS — BP 130/75 | HR 74 | Temp 97.9°F | Resp 17 | Ht 64.5 in | Wt 160.0 lb

## 2016-04-22 DIAGNOSIS — R197 Diarrhea, unspecified: Secondary | ICD-10-CM

## 2016-04-22 DIAGNOSIS — F331 Major depressive disorder, recurrent, moderate: Secondary | ICD-10-CM

## 2016-04-22 DIAGNOSIS — Z170 Estrogen receptor positive status [ER+]: Secondary | ICD-10-CM | POA: Diagnosis not present

## 2016-04-22 DIAGNOSIS — C50311 Malignant neoplasm of lower-inner quadrant of right female breast: Secondary | ICD-10-CM

## 2016-04-22 DIAGNOSIS — F329 Major depressive disorder, single episode, unspecified: Secondary | ICD-10-CM

## 2016-04-22 DIAGNOSIS — E860 Dehydration: Secondary | ICD-10-CM

## 2016-04-22 DIAGNOSIS — R42 Dizziness and giddiness: Secondary | ICD-10-CM

## 2016-04-22 DIAGNOSIS — R110 Nausea: Secondary | ICD-10-CM

## 2016-04-22 DIAGNOSIS — Z79811 Long term (current) use of aromatase inhibitors: Secondary | ICD-10-CM

## 2016-04-22 LAB — COMPREHENSIVE METABOLIC PANEL
ALT: 12 U/L (ref 0–55)
ANION GAP: 12 meq/L — AB (ref 3–11)
AST: 18 U/L (ref 5–34)
Albumin: 3.9 g/dL (ref 3.5–5.0)
Alkaline Phosphatase: 107 U/L (ref 40–150)
BUN: 6.4 mg/dL — AB (ref 7.0–26.0)
CHLORIDE: 107 meq/L (ref 98–109)
CO2: 24 meq/L (ref 22–29)
CREATININE: 0.9 mg/dL (ref 0.6–1.1)
Calcium: 9.8 mg/dL (ref 8.4–10.4)
EGFR: 76 mL/min/{1.73_m2} — ABNORMAL LOW (ref >90)
GLUCOSE: 82 mg/dL (ref 70–140)
Potassium: 3.8 mEq/L (ref 3.5–5.1)
SODIUM: 142 meq/L (ref 136–145)
Total Bilirubin: 0.38 mg/dL (ref 0.20–1.20)
Total Protein: 7.9 g/dL (ref 6.4–8.3)

## 2016-04-22 LAB — CBC WITH DIFFERENTIAL/PLATELET
BASO%: 1.1 % (ref 0.0–2.0)
Basophils Absolute: 0.1 10*3/uL (ref 0.0–0.1)
EOS%: 2 % (ref 0.0–7.0)
Eosinophils Absolute: 0.1 10*3/uL (ref 0.0–0.5)
HCT: 39.8 % (ref 34.8–46.6)
HGB: 13.3 g/dL (ref 11.6–15.9)
LYMPH%: 23.4 % (ref 14.0–49.7)
MCH: 31.4 pg (ref 25.1–34.0)
MCHC: 33.4 g/dL (ref 31.5–36.0)
MCV: 94 fL (ref 79.5–101.0)
MONO#: 0.4 10*3/uL (ref 0.1–0.9)
MONO%: 7.2 % (ref 0.0–14.0)
NEUT#: 3.7 10*3/uL (ref 1.5–6.5)
NEUT%: 66.3 % (ref 38.4–76.8)
Platelets: 233 10*3/uL (ref 145–400)
RBC: 4.23 10*6/uL (ref 3.70–5.45)
RDW: 13.2 % (ref 11.2–14.5)
WBC: 5.6 10*3/uL (ref 3.9–10.3)
lymph#: 1.3 10*3/uL (ref 0.9–3.3)

## 2016-04-22 MED ORDER — PAROXETINE HCL 30 MG PO TABS: 30.0000 mg | ORAL_TABLET | Freq: Every day | ORAL | 5 refills | Status: DC

## 2016-04-22 MED ORDER — ONDANSETRON HCL 8 MG PO TABS: 8.0000 mg | ORAL_TABLET | Freq: Three times a day (TID) | ORAL | 0 refills | Status: DC | PRN

## 2016-04-22 NOTE — Progress Notes (Signed)
SYMPTOM MANAGEMENT CLINIC    Chief Complaint: Nausea, dizziness  HPI:  Tami [name] 65 y.o. female diagnosed with breast cancer, seen today in [facility] with a 6-7 day history of nausea and dizziness. Patient recently changed from Paxil to Effexor as it was thought her Paxil was not working well. She is unsure exactly when she started to transition from Paxil to Effexor, but symptoms seemed to start around this time. States that she alternated taking Paxil and Effexor for about 5 days then stopped Paxil and took only Effexor. Stopped Effexor 3 days ago due to symptom and has not been on any antidepresseant since that time. Patient had been on Paxil for ~ 15 years. Has been using Zofran to help her nausea. She has not had any vomiting. Has diarrhea, but that is better now. Admits to being depressed and not having much energy. Hot flashes better on letrozole and feels these are manageable.     Breast cancer of lower-inner quadrant of right female breast ([place])   01/18/2014 Mammogram    U/S Breast: 7 x 7 x 9 mm irregular taller than wide hypoechoic mass with a peripheral echogenic rim in the right breast 4 o'clock position 7 cm from the nipple. No right axillary lymphadenopathy.       02/14/2014 Initial Diagnosis    Breast cancer-right Er 100%, PR 94%, Ki 67: 17%; Her2 Neg ratio 1.74; T1bN0Mo (Clinical Stage 1A)      04/02/2014 Surgery    Right breast lumpectomy: Invasive ductal carcinoma negative for lymph vascular invasion, 1.6 cm, grade 2, 3 SLN negative, reexcision margins benign, ER 100%, PR 94%, HER-2 negative Ki-67 70% T1 C. N0 M0 stage IA: Oncotype DX 29 (19%ROR)      05/07/2014 - 07/09/2014 Chemotherapy    Taxotere Cytoxan adjuvant chemotherapy every 3 weeks x4 cycles (after cycle one hospitalization for pain related to Neulasta and severe gastritis due to steroids)      08/07/2014 - 09/16/2014 Radiation Therapy    Adjuvant radiation therapy      10/04/2014 -  Anti-estrogen oral therapy   Anastrozole 1 mg daily changed to letrozole 09/16/2015 due to hot flashes       Review of Systems  Constitutional: Positive for malaise/fatigue. Negative for chills, fever and weight loss.  HENT: Negative.   Eyes: Negative.   Respiratory: Negative.   Cardiovascular: Negative.   Gastrointestinal: Positive for diarrhea and nausea. Negative for vomiting.  Genitourinary: Negative.   Musculoskeletal: Negative.   Skin: Negative.   Neurological: Positive for dizziness and weakness. Negative for tingling, tremors, sensory change, speech change, focal weakness, seizures and loss of consciousness.  Endo/Heme/Allergies: Negative.   Psychiatric/Behavioral: Positive for depression. Negative for suicidal ideas.    Past Medical History:  Diagnosis Date  . Anemia    as a child  . Anxiety   . Arthritis   . Asthma   . Atrial fibrillation ([place])   . Dysrhythmia    hx AF  . Family history of malignant neoplasm of breast   . Heart palpitations   . Heartburn   . Hyperlipidemia   . Invasive ductal carcinoma of right breast ([place]) 02/08/14   Lower Inner Quadrant  . Irregular heartbeat   . Malignant neoplasm of breast (female), unspecified site [facility])   . Numbness of arm    Right Arm  . Osteoporosis   . Pure hypercholesterolemia   . SVT (supraventricular tachycardia) ([facility])     Past Surgical History:  Procedure Laterality Date  .  30 Day Monitor  04/10/2009 to 05/23/2009   No End Of Summary Report Atrial Fib,Narrow Complex Tachycardia,Sinus Rhythm   . ABDOMINAL HYSTERECTOMY  1987  . BREAST LUMPECTOMY WITH NEEDLE LOCALIZATION AND AXILLARY SENTINEL LYMPH NODE BX Right 04/02/2014   Procedure: Right Axillary Lymphatic Mapping; Right axillary Sentinal Node Biopsy; Right Partial Mastectomy after wire localization;  Surgeon: Jackolyn Confer, MD;  Location: [place];  Service: General;  Laterality: Right;  . BREAST SURGERY     Left  . CYSTOSCOPY  04/25/2012   Pt may be a candidate for research  anti-incontinence procedure protocol  . Dipyridamole Myoview  05/27/2009   The post stress myocardial perfusion images show a normal pattern of perfusion in all regions.The post stress ejection fraction is 83 %. Normal  myocardial perfusion imaging This is a low risk scan.  . DOPPLER ECHOCARDIOGRAPHY  08/21/2010   Ejecion Fraction =>55% All chambers are normal in size and function No significant change from 2010.  [name] lower Venous Dopplers  04/11/2009   Normal bilateral lower ext venous duplex  . PORTACATH PLACEMENT N/A 04/26/2014   Procedure: ULTRASOUND GUIDED PORT A CATHE INSERTION;  Surgeon: Jackolyn Confer, MD;  Location: [facility];  Service: General;  Laterality: N/A;  . Upper Arterial Dopplers  06/02/2011   Right Side PPG Demonstrates siginificantly diminshed waveforms with the Adson's maneuver which may suggest Thoracic Outlet Syndrone. This is a abnormal thoracic outlet arterial evaluation.    has CANDIDIASIS, ESOPHAGEAL; CARCINOMA IN SITU, VULVA; PSVT; ASTHMA UNSPECIFIED WITH EXACERBATION; GERD; IRRITABLE BOWEL SYNDROME; OSTEOARTHRITIS; Essential hypertension; Abdominal pain, epigastric; Numbness and tingling of right arm and leg; Female stress incontinence; Hydronephrosis; DDD (degenerative disc disease), lumbar; Osteoporosis, unspecified; Caregiver stress; Hyperlipidemia; Breast cancer of lower-inner quadrant of right female breast ([place]); Family history of malignant neoplasm of breast; Oral candidiasis; Dehydration; Generalized pain; PAF (paroxysmal atrial fibrillation) ([place]); PUD (peptic ulcer disease); Transaminitis; Normocytic anemia; Epigastric abdominal pain; Abdominal pain; Abnormal LFTs; Swelling of both ankles; First degree heart block; and Trigger finger, acquired on her problem list.    is allergic to latex; sulfonamide derivatives; clarithromycin; crestor [rosuvastatin]; statins; and ciprofloxacin hcl.    Medication List       Accurate as of 04/22/16  2:35 PM. Always use your most  recent med list.          amitriptyline 25 MG tablet Commonly known as:  ELAVIL Take 25 mg by mouth at bedtime as needed for sleep. When patient has migraines   aspirin EC 81 MG tablet Take 81 mg by mouth daily.   esomeprazole 40 MG capsule Commonly known as:  NEXIUM Take 1 capsule (40 mg total) by mouth daily at 12 noon.   flecainide 100 MG tablet Commonly known as:  TAMBOCOR Take 0.5 tablets (50 mg total) by mouth 2 (two) times daily.   ibuprofen 200 MG tablet Commonly known as:  ADVIL,MOTRIN Take 200 mg by mouth every 6 (six) hours as needed for headache or moderate pain.   letrozole 2.5 MG tablet Commonly known as:  FEMARA TAKE 1 TABLET BY MOUTH DAILY   metoprolol succinate 50 MG 24 hr tablet Commonly known as:  TOPROL-XL TAKE 1 TABLET BY MOUTH DAILY. TAKE WITH OR IMMEDIATELY FOLLOWING A MEAL.   prochlorperazine 10 MG tablet Commonly known as:  COMPAZINE Take 1 tablet (10 mg total) by mouth every 6 (six) hours as needed (Nausea or vomiting).   spironolactone 25 MG tablet Commonly known as:  ALDACTONE Take 1 tablet (25 mg total) by  mouth daily.   URIBEL 118 MG Caps Take 118 mg by mouth 4 (four) times daily as needed (for bladder). Reported on 09/09/2015   venlafaxine XR 37.5 MG 24 hr capsule Commonly known as:  EFFEXOR-XR Take 1 capsule (37.5 mg total) by mouth daily with breakfast.   Vitamin D3 10000 units capsule Take 10,000 Units by mouth 2 (two) times daily.        PHYSICAL EXAMINATION  Oncology Vitals 04/22/2016 03/18/2016  Height 164 cm 164 cm  Weight 72.576 kg 73.074 kg  Weight (lbs) 160 lbs 161 lbs 2 oz  BMI (kg/m2) 27.04 kg/m2 27.23 kg/m2  Temp 97.9 98.6  Pulse 74 66  Resp 17 18  SpO2 100 97  BSA (m2) 1.82 m2 1.82 m2   BP Readings from Last 2 Encounters:  04/22/16 130/75  03/18/16 (!) 158/79    Physical Exam  Constitutional: She is oriented to person, place, and time and well-developed, well-nourished, and in no distress.  HENT:    Head: Normocephalic and atraumatic.  Mouth/Throat: Oropharynx is clear and moist.  Eyes: Conjunctivae and EOM are normal. Pupils are equal, round, and reactive to light. No scleral icterus.  Neck: Normal range of motion. Neck supple. No thyromegaly present.  Cardiovascular: Normal rate, regular rhythm and normal heart sounds.   Pulmonary/Chest: Effort normal and breath sounds normal. No respiratory distress. She has no wheezes.  Abdominal: Soft. Bowel sounds are normal. She exhibits no distension and no mass. There is no tenderness.  Musculoskeletal: She exhibits no edema.  Lymphadenopathy:    She has no cervical adenopathy.  Neurological: She is alert and oriented to person, place, and time. No cranial nerve deficit.  Skin: Skin is warm and dry.  Psychiatric: Memory, affect and judgment normal.  Tearful at times today during visit.    LABORATORY DATA:. Appointment on 04/22/2016  Component Date Value Ref Range Status  . WBC 04/22/2016 5.6  3.9 - 10.3 10e3/uL Final  . NEUT# 04/22/2016 3.7  1.5 - 6.5 10e3/uL Final  . HGB 04/22/2016 13.3  11.6 - 15.9 g/dL Final  . HCT 04/22/2016 39.8  34.8 - 46.6 % Final  . Platelets 04/22/2016 233  145 - 400 10e3/uL Final  . MCV 04/22/2016 94.0  79.5 - 101.0 fL Final  . MCH 04/22/2016 31.4  25.1 - 34.0 pg Final  . MCHC 04/22/2016 33.4  31.5 - 36.0 g/dL Final  . RBC 04/22/2016 4.23  3.70 - 5.45 10e6/uL Final  . RDW 04/22/2016 13.2  11.2 - 14.5 % Final  . lymph# 04/22/2016 1.3  0.9 - 3.3 10e3/uL Final  . MONO# 04/22/2016 0.4  0.1 - 0.9 10e3/uL Final  . Eosinophils Absolute 04/22/2016 0.1  0.0 - 0.5 10e3/uL Final  . Basophils Absolute 04/22/2016 0.1  0.0 - 0.1 10e3/uL Final  . NEUT% 04/22/2016 66.3  38.4 - 76.8 % Final  . LYMPH% 04/22/2016 23.4  14.0 - 49.7 % Final  . MONO% 04/22/2016 7.2  0.0 - 14.0 % Final  . EOS% 04/22/2016 2.0  0.0 - 7.0 % Final  . BASO% 04/22/2016 1.1  0.0 - 2.0 % Final    RADIOGRAPHIC STUDIES: No results  found.  ASSESSMENT/PLAN:    No problem-specific Assessment & Plan notes found for this encounter. This is a 65 year old female with breast cancer. Seen in [facility] today for nausea and dizziness.  Exam was unrevealing today. Admits to depression. Came off Paxil in only about 6 days which might have been too quick. I  suspect symptoms are due to withdrawal from SSRI. Patient stated that Paxil has worked well for 15 years. Only recently felt at though she was more depressed. Hot flashes manageable.  Will resume her Paxil 20 mg daily for approximately 2 weeks and then in crease to 30 mg daily. This drug has worked well for her. Hopefully with an increase in dose, depressive symptoms will improve. We discussed that since she has been off this medication that it might take several weeks to see the full effect. If depressive symptoms not controlled with restarting Paxil, may need to consider referral to psych for assistance with medication management. I also gave her a prescription for Zofran to help with nausea. Keep follow-up as scheduled.    Patient stated understanding of all instructions; and was in agreement with this plan of care. The patient knows to call the clinic with any problems, questions or concerns.    Mikey Bussing, NP 04/22/2016

## 2016-04-23 ENCOUNTER — Other Ambulatory Visit: Payer: Self-pay | Admitting: Hematology and Oncology

## 2016-04-23 DIAGNOSIS — F32A Depression, unspecified: Secondary | ICD-10-CM | POA: Insufficient documentation

## 2016-04-23 DIAGNOSIS — F329 Major depressive disorder, single episode, unspecified: Secondary | ICD-10-CM | POA: Insufficient documentation

## 2016-04-23 DIAGNOSIS — R110 Nausea: Secondary | ICD-10-CM | POA: Insufficient documentation

## 2016-04-24 NOTE — Telephone Encounter (Signed)
Chart reviewed.

## 2016-05-06 ENCOUNTER — Other Ambulatory Visit: Payer: Self-pay | Admitting: *Deleted

## 2016-05-06 ENCOUNTER — Encounter: Payer: Self-pay | Admitting: *Deleted

## 2016-05-06 DIAGNOSIS — E785 Hyperlipidemia, unspecified: Secondary | ICD-10-CM

## 2016-05-06 DIAGNOSIS — Z79899 Other long term (current) drug therapy: Secondary | ICD-10-CM

## 2016-05-26 ENCOUNTER — Ambulatory Visit: Payer: Self-pay | Admitting: Internal Medicine

## 2016-05-26 VITALS — BP 146/88 | HR 112 | Temp 98.3°F | Wt 129.0 lb

## 2016-06-02 NOTE — Progress Notes (Signed)
A user error has taken place.

## 2016-06-03 ENCOUNTER — Ambulatory Visit (INDEPENDENT_AMBULATORY_CARE_PROVIDER_SITE_OTHER): Payer: Medicare Other | Admitting: Physician Assistant

## 2016-06-03 VITALS — BP 124/74 | HR 72 | Temp 98.7°F | Resp 17 | Ht 64.5 in | Wt 161.0 lb

## 2016-06-03 DIAGNOSIS — R059 Cough, unspecified: Secondary | ICD-10-CM

## 2016-06-03 DIAGNOSIS — R05 Cough: Secondary | ICD-10-CM

## 2016-06-03 DIAGNOSIS — R0981 Nasal congestion: Secondary | ICD-10-CM

## 2016-06-03 DIAGNOSIS — J3489 Other specified disorders of nose and nasal sinuses: Secondary | ICD-10-CM | POA: Diagnosis not present

## 2016-06-03 MED ORDER — FLUTICASONE PROPIONATE 50 MCG/ACT NA SUSP: 2.0000 | Freq: Every day | NASAL | 0 refills | Status: DC

## 2016-06-03 MED ORDER — ALBUTEROL SULFATE HFA 108 (90 BASE) MCG/ACT IN AERS: 2.0000 | INHALATION_SPRAY | RESPIRATORY_TRACT | 0 refills | Status: DC | PRN

## 2016-06-03 MED ORDER — HYDROCOD POLST-CPM POLST ER 10-8 MG/5ML PO SUER: 5.0000 mL | Freq: Two times a day (BID) | ORAL | 0 refills | Status: DC | PRN

## 2016-06-03 MED ORDER — BENZONATATE 100 MG PO CAPS: 100.0000 mg | ORAL_CAPSULE | Freq: Three times a day (TID) | ORAL | 0 refills | Status: DC | PRN

## 2016-06-03 NOTE — Progress Notes (Signed)
MRN: [id number] DOB: Apr 24, 1951  Subjective:   Tami [name] is a 65 y.o. female presenting for chief complaint of Sore Throat (with nasal drainage and sinus issues x3days) . [name] Reports 3-4 day history of sinus congestion, sinus pain, sore throat, dry cough, and raspy voice. Has tried dayquil with relief. Denies fever, ear fullness, difficulty swallowing, wheezing, shortness of breath, chest tightness and myalgia, night sweats, chills, fatigue, malaise, nausea, vomiting, abdominal pain and diarrhea. Has had sick contact with grandson. Has history of seasonal allergies and takes zytec, history of asthma but does not have any inhalers. Patient has not had the flu shot this season. Denies smoking, no alcohol use. Denies any other aggravating or relieving factors, no other questions or concerns. History of sinus infections.   Nazaria has a current medication list which includes the following prescription(s): amitriptyline, aspirin ec, vitamin d3, esomeprazole, flecainide, ibuprofen, letrozole, uribel, metoprolol succinate, ondansetron, paroxetine, spironolactone, albuterol, benzonatate, chlorpheniramine-hydrocodone, fluticasone, and prochlorperazine. Also is allergic to latex; sulfonamide derivatives; clarithromycin; crestor [rosuvastatin]; statins; and ciprofloxacin hcl.  [place]  has a past medical history of Anemia; Anxiety; Arthritis; Asthma; Atrial fibrillation ([place]); Dysrhythmia; Family history of malignant neoplasm of breast; Heart palpitations; Heartburn; Hyperlipidemia; Invasive ductal carcinoma of right breast ([place]) (02/08/14); Irregular heartbeat; Malignant neoplasm of breast (female), unspecified site; Numbness of arm; Osteoporosis; Pure hypercholesterolemia; and SVT (supraventricular tachycardia) ([place]). Also  has a past surgical history that includes Abdominal hysterectomy (1987); Cystoscopy (04/25/2012); doppler echocardiography (08/21/2010); Dipyridamole Myoview (05/27/2009); Upper Arterial  Dopplers (06/02/2011); lower Venous Dopplers (04/11/2009); 30 Day Monitor (04/10/2009 to 05/23/2009); Breast surgery; Breast lumpectomy with needle localization and axillary sentinel lymph node bx (Right, 04/02/2014); and Portacath placement (N/A, 04/26/2014).   Objective:   Vitals: BP 124/74 (BP Location: Right Arm, Patient Position: Sitting, Cuff Size: Large)   Pulse 72   Temp 98.7 F (37.1 C) (Oral)   Resp 17   Ht 5' 4.5" (1.638 m)   Wt 161 lb (73 kg)   LMP 07/06/1985   SpO2 97%   BMI 27.21 kg/m   Physical Exam  Constitutional: She appears well-developed and well-nourished.  HENT:  Head: Normocephalic and atraumatic.  Right Ear: Tympanic membrane, external ear and ear canal normal.  Left Ear: Tympanic membrane, external ear and ear canal normal.  Nose: Mucosal edema (more prominent on right) present. Right sinus exhibits frontal sinus tenderness (more prominent). Right sinus exhibits no maxillary sinus tenderness. Left sinus exhibits frontal sinus tenderness. Left sinus exhibits no maxillary sinus tenderness.  Mouth/Throat: Uvula is midline and mucous membranes are normal. Posterior oropharyngeal erythema present.  Eyes: Conjunctivae are normal.  Neck: Normal range of motion.  Cardiovascular: Normal rate, regular rhythm and normal heart sounds.   Pulmonary/Chest: Effort normal and breath sounds normal.  Lymphadenopathy:       Head (right side): No submental, no submandibular, no tonsillar, no preauricular, no posterior auricular and no occipital adenopathy present.       Head (left side): No submental, no submandibular, no tonsillar, no preauricular, no posterior auricular and no occipital adenopathy present.    She has no cervical adenopathy.       Right: No supraclavicular adenopathy present.       Left: No supraclavicular adenopathy present.  Skin: Skin is warm and dry.  Psychiatric: She has a normal mood and affect.   No results found for this or any previous visit (from the  past 24 hour(s)).  Assessment and Plan :  1. Nasal congestion - fluticasone (FLONASE)  50 MCG/ACT nasal spray; Place 2 sprays into both nostrils daily.  Dispense: 16 g; Refill: 0 2. Sinus pressure 3. Cough - benzonatate (TESSALON) 100 MG capsule; Take 1-2 capsules (100-200 mg total) by mouth 3 (three) times daily as needed for cough.  Dispense: 40 capsule; Refill: 0 - chlorpheniramine-HYDROcodone (TUSSIONEX PENNKINETIC ER) 10-8 MG/5ML SUER; Take 5 mLs by mouth every 12 (twelve) hours as needed for cough.  Dispense: 100 mL; Refill: 0  -Likely viral etiology, will treat symptomatically. Pt instructed to call our office if she does not clear her sinus pressure in 4-5 days as she may warrant an antibiotic prescription at that time. Seek care sooner if symptoms worsen.   Tenna Delaine, PA-C  [facility] 06/03/2016 6:42 PM

## 2016-06-03 NOTE — Patient Instructions (Addendum)
-   We will treat this as a viral infection.  - I recommend you rest, drink plenty of fluids, eat light meals including soups.  - You may use cough syrup at night for your cough and sore throat, Tessalon pearls during the day. Be aware that cough syrup can definitely make you drowsy and sleepy so do not drive or operate any heavy machinery if it is affecting you during the day.  - You may also use Tylenol or ibuprofen over-the-counter for your sore throat.  - Use flonase for your nasal congestion, I also recommend using the netipot.  - Please let me know if you are not seeing any improvement or get worse in 4-5 days.    IF you received an x-ray today, you will receive an invoice from [facility]. Please contact [facility] at [phone] with questions or concerns regarding your invoice.   IF you received labwork today, you will receive an invoice from [employer]. Please contact [facility] at [phone] with questions or concerns regarding your invoice.   Our billing staff will not be able to assist you with questions regarding bills from these companies.  You will be contacted with the lab results as soon as they are available. The fastest way to get your results is to activate your My Chart account. Instructions are located on the last page of this paperwork. If you have not heard from [place] regarding the results in 2 weeks, please contact this office.

## 2016-06-04 ENCOUNTER — Other Ambulatory Visit: Payer: Self-pay

## 2016-06-04 NOTE — Telephone Encounter (Signed)
Patient called to request an antibiotic.  She saw Timmothy Euler last time and she told her to call back if she wasn't feeling better and request an antibiotic.  She also states that her insurance won't cover her Albuterol inhaler unless it is deemed medically necessary.  Please advise!  [phone]

## 2016-06-05 NOTE — Telephone Encounter (Signed)
Per note from [place], Likely viral etiology, will treat symptomatically. Pt instructed to call our office if she does not clear her sinus pressure in 4-5 days as she may warrant an antibiotic prescription at that time. Seek care sooner if symptoms worsen.  This was  On 06/03/16, so patient has not waited the recommended time frame, I have called her encouraged her to give this a couple more days to improve, if viral should feel better soon.   To you FYI

## 2016-06-07 NOTE — Telephone Encounter (Signed)
Will you call the patient and ask how she is doing today? Please let me know if she is not any better.

## 2016-06-08 MED ORDER — ALBUTEROL SULFATE HFA 108 (90 BASE) MCG/ACT IN AERS: 2.0000 | INHALATION_SPRAY | RESPIRATORY_TRACT | 3 refills | Status: DC | PRN

## 2016-06-08 NOTE — Telephone Encounter (Signed)
Pt reported that she was starting to feel better yesterday, but went out to get some things done and now is worse again. Her cough is worse and now her chest hurts when she coughs. One side of her head also feels "tighter" and more congested. No fever. Pt is taking Mucinex and drinking plenty of water.   She reports that her husband paid cash for the Ventolin albuterol inh, but I explained that if pharm would try running one of the other name brands of albuterol (Pro Air or Proventil) through ins, one would most likely be covered. Called pharm and they tried both and stated that the ProAir is covered. I had her get it ready for pt who may try to exchange them since she has not used the Ventolin yet. Pt would like RFs on her ProAir so that she can have it in case of asthma flair. Pended.

## 2016-06-09 ENCOUNTER — Encounter: Payer: Medicare Other | Admitting: Physician Assistant

## 2016-06-09 MED ORDER — DOXYCYCLINE HYCLATE 100 MG PO CAPS: 100.0000 mg | ORAL_CAPSULE | Freq: Two times a day (BID) | ORAL | 0 refills | Status: AC

## 2016-06-09 NOTE — Telephone Encounter (Signed)
I called patient to clarify if it is her sinus symptoms or cough symptoms that have not improved. I left her a voicemail to call back and let me know because it would warrant a different antibiotic.

## 2016-06-09 NOTE — Telephone Encounter (Signed)
[place], please advise on orig message below - pt asking for Abx. Given report on status below did you want to Rx an Abx, or have pt RTC?

## 2016-06-09 NOTE — Addendum Note (Signed)
Addended by: Tenna Delaine D on: 06/09/2016 05:18 PM   Modules accepted: Orders

## 2016-06-09 NOTE — Telephone Encounter (Signed)
Talked with patient. Cough is still present and worse now. It has become more productive with yellow-green phlegm production. She has tried symptomatic treatment. Will prescribe antibiotic at this time.   Meds ordered this encounter  Medications  . albuterol (PROVENTIL HFA;VENTOLIN HFA) 108 (90 Base) MCG/ACT inhaler    Sig: Inhale 2 puffs into the lungs every 4 (four) hours as needed (cough, shortness of breath or wheezing.).    Dispense:  1 Inhaler    Refill:  3    FILL WITH PROAIR PER INSURANCE COVERAGE  . doxycycline (VIBRAMYCIN) 100 MG capsule    Sig: Take 1 capsule (100 mg total) by mouth 2 (two) times daily.    Dispense:  20 capsule    Refill:  0    Order Specific Question:   Supervising Provider    Answer:   Wardell Honour [[id number]]

## 2016-06-19 ENCOUNTER — Other Ambulatory Visit: Payer: Self-pay | Admitting: Nurse Practitioner

## 2016-07-29 LAB — LIPID PANEL
CHOL/HDL RATIO: 3.2 ratio (ref <5.0)
Cholesterol: 239 mg/dL — ABNORMAL HIGH (ref <200)
HDL: 74 mg/dL (ref >50)
LDL Cholesterol: 143 mg/dL — ABNORMAL HIGH (ref <100)
Triglycerides: 111 mg/dL (ref <150)
VLDL: 22 mg/dL (ref <30)

## 2016-07-29 LAB — COMPREHENSIVE METABOLIC PANEL
ALT: 16 U/L (ref 6–29)
AST: 20 U/L (ref 10–35)
Albumin: 4.1 g/dL (ref 3.6–5.1)
Alkaline Phosphatase: 86 U/L (ref 33–130)
BUN: 10 mg/dL (ref 7–25)
CHLORIDE: 107 mmol/L (ref 98–110)
CO2: 30 mmol/L (ref 20–31)
CREATININE: 0.86 mg/dL (ref 0.50–0.99)
Calcium: 9.5 mg/dL (ref 8.6–10.4)
Glucose, Bld: 95 mg/dL (ref 65–99)
Potassium: 4.5 mmol/L (ref 3.5–5.3)
SODIUM: 142 mmol/L (ref 135–146)
Total Bilirubin: 0.4 mg/dL (ref 0.2–1.2)
Total Protein: 6.9 g/dL (ref 6.1–8.1)

## 2016-08-02 ENCOUNTER — Other Ambulatory Visit: Payer: Self-pay | Admitting: *Deleted

## 2016-08-02 ENCOUNTER — Encounter: Payer: Self-pay | Admitting: *Deleted

## 2016-08-02 ENCOUNTER — Telehealth: Payer: Self-pay | Admitting: *Deleted

## 2016-08-02 DIAGNOSIS — Z79899 Other long term (current) drug therapy: Secondary | ICD-10-CM

## 2016-08-02 DIAGNOSIS — E7849 Other hyperlipidemia: Secondary | ICD-10-CM

## 2016-08-02 MED ORDER — PITAVASTATIN CALCIUM 2 MG PO TABS: 0.5000 | ORAL_TABLET | Freq: Every day | ORAL | 0 refills | Status: DC

## 2016-08-02 NOTE — Telephone Encounter (Signed)
Patient notified of lab results and recommendations. She voiced verbal understanding and agrees to trial of Livalo.

## 2016-08-02 NOTE — Telephone Encounter (Signed)
Livalo 2 mg trial samples #14 provided to patient. She was instructed per VO by Dr Claiborne Billings to start out with 1/2 tablet daily. If she can tolerate 1 mg then increase to 2 mg daily.

## 2016-08-02 NOTE — Telephone Encounter (Signed)
-----   Message from Troy Sine, MD sent at 08/01/2016  9:48 PM EST ----- Labs good x LDL; could not tolerate several statins;  Trial of livalo 1 mg daily and if tolerates then increase to 2 mg

## 2016-08-16 ENCOUNTER — Telehealth: Payer: Self-pay | Admitting: Cardiovascular Disease

## 2016-08-16 MED ORDER — PITAVASTATIN CALCIUM 2 MG PO TABS: 0.5000 | ORAL_TABLET | Freq: Every day | ORAL | 1 refills | Status: DC

## 2016-08-16 NOTE — Telephone Encounter (Signed)
New message    Pt calling stating she was given samples of Livalo 2mg -1/2 tab once daily. She states she is doing well on the medication and would like a prescription sent to her pharmacy. [facility] on [address]   Pt states she can also be reached at [phone].

## 2016-08-16 NOTE — Telephone Encounter (Signed)
Medication samples have been provided to the patient.  Drug name: livalo 2mg  (patient takes 1/2 tablet)  Qty: 2 boxes (14 tabs)  LOT: [id number]  Exp.Date: 12/2018  Samples left at front desk for patient pick-up. Patient notified.  Sheral Apley M 2:48 PM 08/16/2016

## 2016-09-09 ENCOUNTER — Telehealth: Payer: Self-pay | Admitting: Hematology and Oncology

## 2016-09-09 NOTE — Telephone Encounter (Signed)
Left massage on VM about appointment change and left out number for questions or concerns

## 2016-09-15 ENCOUNTER — Telehealth: Payer: Self-pay | Admitting: Emergency Medicine

## 2016-09-15 ENCOUNTER — Ambulatory Visit: Payer: Medicare Other | Admitting: Hematology and Oncology

## 2016-09-15 NOTE — Assessment & Plan Note (Deleted)
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 status post lumpectomy, Oncotype DX recurrence score 29 intermediate risk 19% risk of recurrence with hormonal therapy alone  Chemotherapy summary: Taxotere and Cytoxan 4 started 05/07/2014 completed 07/09/2014 Long-term chemotherapy toxicities: Neuropathy Radiation therapy: Started 08/07/2014 completed 09/16/14  Anastrozole 1 mg daily started 10/04/14 switched to letrozole 09/16/2015 Letrozole Toxicities: 1. Depression: we switched her treatment from Paxil to Effexor.  2. Hot flashes: Much better on letrozole.  Cognitive dysfunction: We discussed the importance of physical exercise, eating healthy, reading books and puzzles. I encouraged her to participate in cognitive dysfunction clinical trial cc CW [id number] randomized between donepezil and placebo for 24 weeks. Patient is interested in this trial.  Breast Cancer Surveillance: 1. Breast exam 09/15/2016: Normal 2. Mammogram 03/09/2016 No abnormalities. Postsurgical changes. Breast Density Category C. I recommended that she get 3-D mammograms for surveillance. Discussed the differences between different breast density categories.  RTC in one year 

## 2016-09-15 NOTE — Telephone Encounter (Signed)
Patient called to cancel appointment for 3/14 due to stomach virus. Patient states she is having nausea and diarrhea. Patient states she is taking her nausea medicine as needed; patient is holding off on taking any Imodium at this time due to the fact that she has IBS. Appointment rescheduled and new date and time given to patient.

## 2016-09-21 ENCOUNTER — Telehealth: Payer: Self-pay | Admitting: Cardiovascular Disease

## 2016-09-21 NOTE — Telephone Encounter (Signed)
New message       Pt c/o medication issue:  1. Name of Medication: livalo 2. How are you currently taking this medication (dosage and times per day)? 2mg  3. Are you having a reaction (difficulty breathing--STAT)? no 4. What is your medication issue? Calling to give report on statin.  Pt states that since starting medication her rt hip has started hurting.  Also, since starting medication, pt states that her legs are weak causing her to fall several times from just walking.  Please advise

## 2016-09-21 NOTE — Telephone Encounter (Signed)
Pt states that she started this medication in january and about 3 weeks after starting medication her hips has started hurting.  Also, since starting medication, pt states that her legs are weak causing her to fall several times from just walking.  This seems to be getting worse. is there another medication she can try?  Please advise

## 2016-09-23 NOTE — Telephone Encounter (Signed)
**  Stop livalo for 2 weeks until symptoms resolved. If all symptoms resolved, please resume at 1mg  daily as previously tolerated.  Noted patient intolerant to Rosuvastatin & Zetia   **Additional alternative pravastatin 10mg  daily once symptoms resolved**  May give appointment for Lipid clinic (pharmacist) if patient agreeable.

## 2016-09-28 ENCOUNTER — Ambulatory Visit (HOSPITAL_BASED_OUTPATIENT_CLINIC_OR_DEPARTMENT_OTHER): Payer: Medicare Other | Admitting: Hematology and Oncology

## 2016-09-28 ENCOUNTER — Encounter: Payer: Self-pay | Admitting: Hematology and Oncology

## 2016-09-28 DIAGNOSIS — Z170 Estrogen receptor positive status [ER+]: Secondary | ICD-10-CM | POA: Diagnosis not present

## 2016-09-28 DIAGNOSIS — Z79811 Long term (current) use of aromatase inhibitors: Secondary | ICD-10-CM | POA: Diagnosis not present

## 2016-09-28 DIAGNOSIS — C50311 Malignant neoplasm of lower-inner quadrant of right female breast: Secondary | ICD-10-CM

## 2016-09-28 NOTE — Progress Notes (Signed)
Patient Care Team: [name], MD as PCP - General (Family Medicine) Terance Ice, MD (Cardiology) Carolan Clines, MD as Attending Physician (Urology)  DIAGNOSIS:  Encounter Diagnosis  Name Primary?  . Malignant neoplasm of lower-inner quadrant of right breast of female, estrogen receptor positive ([place])     SUMMARY OF ONCOLOGIC HISTORY:   Breast cancer of lower-inner quadrant of right female breast ([place])   01/18/2014 Mammogram    U/S Breast: 7 x 7 x 9 mm irregular taller than wide hypoechoic mass with a peripheral echogenic rim in the right breast 4 o'clock position 7 cm from the nipple. No right axillary lymphadenopathy.       02/14/2014 Initial Diagnosis    Breast cancer-right Er 100%, PR 94%, Ki 67: 17%; Her2 Neg ratio 1.74; T1bN0Mo (Clinical Stage 1A)      04/02/2014 Surgery    Right breast lumpectomy: Invasive ductal carcinoma negative for lymph vascular invasion, 1.6 cm, grade 2, 3 SLN negative, reexcision margins benign, ER 100%, PR 94%, HER-2 negative Ki-67 70% T1 C. N0 M0 stage IA: Oncotype DX 29 (19%ROR)      05/07/2014 - 07/09/2014 Chemotherapy    Taxotere Cytoxan adjuvant chemotherapy every 3 weeks x4 cycles (after cycle one hospitalization for pain related to Neulasta and severe gastritis due to steroids)      08/07/2014 - 09/16/2014 Radiation Therapy    Adjuvant radiation therapy      10/04/2014 -  Anti-estrogen oral therapy    Anastrozole 1 mg daily changed to letrozole 09/16/2015 due to hot flashes      CHIEF COMPLIANT: Follow-up on letrozole therapy  INTERVAL HISTORY: Tami [name] is a 66 year old with above-mentioned history of right breast cancer treated with lumpectomy followed adjuvant chemotherapy and radiation. She is currently on antiestrogen therapy with letrozole. She continues to have hot flashes but overall her symptoms are manageable. She denies any lumps or nodules in the breast. She has mild tenderness in the right breast when she had  surgery.  REVIEW OF SYSTEMS:   Constitutional: Denies fevers, chills or abnormal weight loss Eyes: Denies blurriness of vision Ears, nose, mouth, throat, and face: Denies mucositis or sore throat Respiratory: Denies cough, dyspnea or wheezes Cardiovascular: Denies palpitation, chest discomfort Gastrointestinal:  Denies nausea, heartburn or change in bowel habits Skin: Denies abnormal skin rashes Lymphatics: Denies new lymphadenopathy or easy bruising Neurological:Denies numbness, tingling or new weaknesses Behavioral/Psych: Mood is stable, no new changes  Extremities: No lower extremity edema Breast:  denies any pain or lumps or nodules in either breasts All other systems were reviewed with the patient and are negative.  I have reviewed the past medical history, past surgical history, social history and family history with the patient and they are unchanged from previous note.  ALLERGIES:  is allergic to latex; sulfonamide derivatives; clarithromycin; crestor [rosuvastatin]; livalo [pitavastatin]; statins; and ciprofloxacin hcl.  MEDICATIONS:  Current Outpatient Prescriptions  Medication Sig Dispense Refill  . albuterol (PROVENTIL HFA;VENTOLIN HFA) 108 (90 Base) MCG/ACT inhaler Inhale 2 puffs into the lungs every 4 (four) hours as needed (cough, shortness of breath or wheezing.). 1 Inhaler 3  . amitriptyline (ELAVIL) 25 MG tablet Take 25 mg by mouth at bedtime as needed for sleep. When patient has migraines    . aspirin EC 81 MG tablet Take 81 mg by mouth daily.    . benzonatate (TESSALON) 100 MG capsule Take 1-2 capsules (100-200 mg total) by mouth 3 (three) times daily as needed for cough. 40 capsule 0  .  chlorpheniramine-HYDROcodone (TUSSIONEX PENNKINETIC ER) 10-8 MG/5ML SUER Take 5 mLs by mouth every 12 (twelve) hours as needed for cough. 100 mL 0  . Cholecalciferol (VITAMIN D3) 10000 units capsule Take 10,000 Units by mouth 2 (two) times daily.    [name] esomeprazole (NEXIUM) 40 MG  capsule Take 1 capsule (40 mg total) by mouth daily at 12 noon. 30 capsule 2  . flecainide (TAMBOCOR) 100 MG tablet Take 0.5 tablets (50 mg total) by mouth 2 (two) times daily. 90 tablet 2  . fluticasone (FLONASE) 50 MCG/ACT nasal spray Place 2 sprays into both nostrils daily. 16 g 0  . ibuprofen (ADVIL,MOTRIN) 200 MG tablet Take 200 mg by mouth every 6 (six) hours as needed for headache or moderate pain.    [name] letrozole (FEMARA) 2.5 MG tablet TAKE ONE TABLET BY MOUTH ONCE DAILY 30 tablet 11  . Meth-Hyo-M Bl-Na Phos-Ph Sal (URIBEL) 118 MG CAPS Take 118 mg by mouth 4 (four) times daily as needed (for bladder). Reported on 09/09/2015  2  . metoprolol succinate (TOPROL-XL) 50 MG 24 hr tablet TAKE 1 TABLET BY MOUTH DAILY. TAKE WITH OR IMMEDIATELY FOLLOWING A MEAL. 90 tablet 3  . ondansetron (ZOFRAN) 8 MG tablet Take 1 tablet (8 mg total) by mouth every 8 (eight) hours as needed for nausea or vomiting. 20 tablet 0  . PARoxetine (PAXIL) 30 MG tablet Take 1 tablet (30 mg total) by mouth daily. 30 tablet 5  . Pitavastatin Calcium (LIVALO) 2 MG TABS Take 0.5 tablets (1 mg total) by mouth daily. 45 tablet 1  . spironolactone (ALDACTONE) 25 MG tablet Take 1 tablet (25 mg total) by mouth daily. 30 tablet 3   No current facility-administered medications for this visit.     PHYSICAL EXAMINATION: ECOG PERFORMANCE STATUS: 1 - Symptomatic but completely ambulatory  Vitals:   09/28/16 1515  BP: 128/63  Pulse: 63  Resp: 18  Temp: 97.9 F (36.6 C)   Filed Weights   09/28/16 1515  Weight: 159 lb 14.4 oz (72.5 kg)    GENERAL:alert, no distress and comfortable SKIN: skin color, texture, turgor are normal, no rashes or significant lesions EYES: normal, Conjunctiva are pink and non-injected, sclera clear OROPHARYNX:no exudate, no erythema and lips, buccal mucosa, and tongue normal  NECK: supple, thyroid normal size, non-tender, without nodularity LYMPH:  no palpable lymphadenopathy in the cervical, axillary  or inguinal LUNGS: clear to auscultation and percussion with normal breathing effort HEART: regular rate & rhythm and no murmurs and no lower extremity edema ABDOMEN:abdomen soft, non-tender and normal bowel sounds MUSCULOSKELETAL:no cyanosis of digits and no clubbing  NEURO: alert & oriented x 3 with fluent speech, no focal motor/sensory deficits EXTREMITIES: No lower extremity edema BREAST: No palpable masses or nodules in either right or left breasts. Surgical scars are palpable. No palpable axillary supraclavicular or infraclavicular adenopathy no breast tenderness or nipple discharge. (exam performed in the presence of a chaperone)  LABORATORY DATA:  I have reviewed the data as listed   Chemistry      Component Value Date/Time   NA 142 07/28/2016 1154   NA 142 04/22/2016 1402   K 4.5 07/28/2016 1154   K 3.8 04/22/2016 1402   CL 107 07/28/2016 1154   CO2 30 07/28/2016 1154   CO2 24 04/22/2016 1402   BUN 10 07/28/2016 1154   BUN 6.4 (L) 04/22/2016 1402   CREATININE 0.86 07/28/2016 1154   CREATININE 0.9 04/22/2016 1402      Component Value Date/Time  CALCIUM 9.5 07/28/2016 1154   CALCIUM 9.8 04/22/2016 1402   ALKPHOS 86 07/28/2016 1154   ALKPHOS 107 04/22/2016 1402   AST 20 07/28/2016 1154   AST 18 04/22/2016 1402   ALT 16 07/28/2016 1154   ALT 12 04/22/2016 1402   BILITOT 0.4 07/28/2016 1154   BILITOT 0.38 04/22/2016 1402       Lab Results  Component Value Date   WBC 5.6 04/22/2016   HGB 13.3 04/22/2016   HCT 39.8 04/22/2016   MCV 94.0 04/22/2016   PLT 233 04/22/2016   NEUTROABS 3.7 04/22/2016    ASSESSMENT & PLAN:  Breast cancer of lower-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 status post lumpectomy, Oncotype DX recurrence score 29 intermediate risk 19% risk of recurrence with hormonal therapy alone  Chemotherapy summary: Taxotere and Cytoxan 4 started  05/07/2014 completed 07/09/2014 Long-term chemotherapy toxicities: Neuropathy Radiation therapy: Started 08/07/2014 completed 09/16/14  Anastrozole 1 mg daily started 10/04/14 switched to letrozole 09/16/2015 Letrozole Toxicities: 1. Depression: we switched her treatment from Paxil to Effexor.  2. Hot flashes: Much better on letrozole.  Cognitive dysfunction: We discussed the importance of physical exercise, eating healthy, reading books and puzzles. I encouraged her to participate in cognitive dysfunction clinical trial cc CW [id number] randomized between donepezil and placebo for 24 weeks. Patient is interested in this trial.  Breast Cancer Surveillance: 1. Breast exam 09/28/2016: Normal 2. Mammogram 03/09/2016 No abnormalities. Postsurgical changes. Breast Density Category C. I recommended that she get 3-D mammograms for surveillance. Discussed the differences between different breast density categories.  RTC in one year for follow-up exam:    I spent 25 minutes talking to the patient of which more than half was spent in counseling and coordination of care.  No orders of the defined types were placed in this encounter.  The patient has a good understanding of the overall plan. she agrees with it. she will call with any problems that may develop before the next visit here.   Rulon Eisenmenger, MD 09/28/16

## 2016-09-28 NOTE — Assessment & Plan Note (Addendum)
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 status post lumpectomy, Oncotype DX recurrence score 29 intermediate risk 19% risk of recurrence with hormonal therapy alone  Chemotherapy summary: Taxotere and Cytoxan 4 started 05/07/2014 completed 07/09/2014 Long-term chemotherapy toxicities: Neuropathy Radiation therapy: Started 08/07/2014 completed 09/16/14  Anastrozole 1 mg daily started 10/04/14 switched to letrozole 09/16/2015 Letrozole Toxicities: 1. Depression: we switched her treatment from Paxil to Effexor.  2. Hot flashes: Much better on letrozole.  Cognitive dysfunction: We discussed the importance of physical exercise, eating healthy, reading books and puzzles. I encouraged her to participate in cognitive dysfunction clinical trial cc CW [id number] randomized between donepezil and placebo for 24 weeks. Patient is interested in this trial.  Breast Cancer Surveillance: 1. Breast exam 09/15/2016: Normal 2. Mammogram 03/09/2016 No abnormalities. Postsurgical changes. Breast Density Category C. I recommended that she get 3-D mammograms for surveillance. Discussed the differences between different breast density categories.  RTC in one year 

## 2016-09-28 NOTE — Telephone Encounter (Signed)
Spoke to patient - gave recommendation to hold livalo x2 weeks. She stated understanding & thanks, and that she would get further instructions at later date due to being in for another MD visit this afternoon.

## 2016-10-07 ENCOUNTER — Telehealth: Payer: Self-pay | Admitting: *Deleted

## 2016-10-07 NOTE — Telephone Encounter (Signed)
"  I need to get a message to Dr. Lindi Adie.  A field underwriter Altha Harm Romie Levee [phone]) will be calling you.  I want to make sure you know before she calls.  I am trying to obtain [employer].  Pharmacy shows a big gap when I started medication.  I do not know where the gap is but I need a letter from him stating I've been on my medication.  I also need something from the computer that proves I've been on medication.  Was on anastrozole (started 10-04-2014) and changed to Letrozole (09-16-2015).  Samples were given so I was not off medication so I hope you all have something on file about this.  It's hard to get insurance.  Return number [id number]."

## 2016-10-08 ENCOUNTER — Telehealth: Payer: Self-pay | Admitting: Emergency Medicine

## 2016-10-08 NOTE — Telephone Encounter (Signed)
Spoke with insurance underwriter, Buena Irish, in regards to a letter that she needs for patient to be approved for a life insurance policy. I gave Altha Harm my work email address for her to send me a template for the letter that she needs. Awaiting this email from [place] in order to write the letter.

## 2016-10-14 ENCOUNTER — Encounter: Payer: Self-pay | Admitting: *Deleted

## 2016-10-14 NOTE — Progress Notes (Signed)
Received vm call from [name]). Tami [name] is requesting for a medical letter from [place] in order for pt to obtain her life insurance policy. An email was sent with a letter draft from [place] for our office to review, verify, and fill in. Sent [name] an email with an updated letter draft for her review to make sure information is compliant with their requirements for underwriting. Will have Dr.Gudena sign and will be ready for pick up tomorrow 10/15/16. Called and lvm for [name] and called pt to update her of this information.

## 2016-10-15 NOTE — Progress Notes (Signed)
Dr.Gudena signed medical letter in order to obtain life insurance. Pt came today to pick up letter and notified pt that it was approved by Buena Irish (life insurance agent). Pt very thankful for time in providing this letter for her.

## 2016-11-08 ENCOUNTER — Other Ambulatory Visit: Payer: Self-pay

## 2016-11-08 MED ORDER — LETROZOLE 2.5 MG PO TABS: 2.5000 mg | ORAL_TABLET | Freq: Every day | ORAL | 4 refills | Status: DC

## 2016-12-15 ENCOUNTER — Encounter: Payer: Self-pay | Admitting: Physician Assistant

## 2016-12-15 ENCOUNTER — Ambulatory Visit (INDEPENDENT_AMBULATORY_CARE_PROVIDER_SITE_OTHER): Payer: Medicare Other | Admitting: Physician Assistant

## 2016-12-15 VITALS — BP 130/79 | HR 61 | Temp 98.6°F | Resp 18 | Ht 63.5 in | Wt 159.4 lb

## 2016-12-15 DIAGNOSIS — J0110 Acute frontal sinusitis, unspecified: Secondary | ICD-10-CM | POA: Diagnosis not present

## 2016-12-15 DIAGNOSIS — R059 Cough, unspecified: Secondary | ICD-10-CM

## 2016-12-15 DIAGNOSIS — R05 Cough: Secondary | ICD-10-CM | POA: Diagnosis not present

## 2016-12-15 DIAGNOSIS — R0981 Nasal congestion: Secondary | ICD-10-CM | POA: Diagnosis not present

## 2016-12-15 DIAGNOSIS — H6122 Impacted cerumen, left ear: Secondary | ICD-10-CM

## 2016-12-15 MED ORDER — HYDROCOD POLST-CPM POLST ER 10-8 MG/5ML PO SUER: 5.0000 mL | Freq: Two times a day (BID) | ORAL | 0 refills | Status: DC | PRN

## 2016-12-15 MED ORDER — FLUTICASONE PROPIONATE 50 MCG/ACT NA SUSP: 2.0000 | Freq: Every day | NASAL | 3 refills | Status: DC

## 2016-12-15 MED ORDER — DOXYCYCLINE HYCLATE 100 MG PO CAPS: 100.0000 mg | ORAL_CAPSULE | Freq: Two times a day (BID) | ORAL | 0 refills | Status: DC

## 2016-12-15 NOTE — Progress Notes (Signed)
MRN: [id number] DOB: 04-05-1951  Subjective:   Tami [name] is a 66 y.o. female presenting for chief complaint of Sore Throat (X 1 week); Headache (X 1 week); and Nasal Congestion (X 1 week) .  Reports 8 day history of sinus headache, sinus congestion, rhinorrhea, ear fullness (worse on left side), sore throat and dry cough (no hemoptysis). Was beginning to feel better but then started feeling worse and the cough became productive. It is keeping her up at night. Has tried flonase, dayquil, theraflu, tussionex consistently with mild relief. Denies fever, wheezing, shortness of breath, chest tightness, chest pain and myalgia, night sweats, chills, nausea, vomiting, abdominal pain and diarrhea. Has had sick contact with granddaughter. Has history of seasonal allergies, has mild history of asthma. No smoking. Denies any other aggravating or relieving factors, no other questions or concerns.  Memory has a current medication list which includes the following prescription(s): albuterol, aspirin ec, chlorpheniramine-hydrocodone, vitamin d3, flecainide, fluticasone, ibuprofen, letrozole, uribel, metoprolol succinate, paroxetine, pitavastatin calcium, amitriptyline, benzonatate, esomeprazole, ondansetron, and spironolactone. Also is allergic to latex; sulfonamide derivatives; clarithromycin; crestor [rosuvastatin]; livalo [pitavastatin]; statins; and ciprofloxacin hcl.  Tami [name]  has a past medical history of Anemia; Anxiety; Arthritis; Asthma; Atrial fibrillation ([place]); Dysrhythmia; Family history of malignant neoplasm of breast; Heart palpitations; Heartburn; Hyperlipidemia; Invasive ductal carcinoma of right breast ([place]) (02/08/14); Irregular heartbeat; Malignant neoplasm of breast (female), unspecified site; Numbness of arm; Osteoporosis; Pure hypercholesterolemia; and SVT (supraventricular tachycardia) ([place]). Also  has a past surgical history that includes Abdominal hysterectomy (1987); Cystoscopy  (04/25/2012); doppler echocardiography (08/21/2010); Dipyridamole Myoview (05/27/2009); Upper Arterial Dopplers (06/02/2011); lower Venous Dopplers (04/11/2009); 30 Day Monitor (04/10/2009 to 05/23/2009); Breast surgery; Breast lumpectomy with needle localization and axillary sentinel lymph node bx (Right, 04/02/2014); and Portacath placement (N/A, 04/26/2014).   Objective:   Vitals: BP 130/79 (BP Location: Right Arm, Patient Position: Sitting, Cuff Size: Normal)   Pulse 61   Temp 98.6 F (37 C) (Oral)   Resp 18   Ht 5' 3.5" (1.613 m)   Wt 159 lb 6.4 oz (72.3 kg)   LMP 07/06/1985   SpO2 98%   BMI 27.79 kg/m   Physical Exam  Constitutional: She is oriented to person, place, and time. She appears well-developed and well-nourished. No distress.  HENT:  Head: Normocephalic and atraumatic.  Right Ear: Tympanic membrane, external ear and ear canal normal.  Left Ear: External ear and ear canal normal. There is drainage (cerumen impaction blocking view of TM).  Nose: Mucosal edema (severe on right side, moderate on left side) present. Right sinus exhibits frontal sinus tenderness. Right sinus exhibits no maxillary sinus tenderness. Left sinus exhibits no maxillary sinus tenderness and no frontal sinus tenderness.  Mouth/Throat: Uvula is midline, oropharynx is clear and moist and mucous membranes are normal. Tonsils are 1+ on the right. Tonsils are 1+ on the left. No tonsillar exudate.  Eyes: Conjunctivae are normal.  Neck: Normal range of motion.  Cardiovascular: Normal rate, regular rhythm and normal heart sounds.   Pulmonary/Chest: Effort normal. She has no wheezes. She has no rales.  Lymphadenopathy:       Head (right side): No submental, no submandibular, no tonsillar, no preauricular, no posterior auricular and no occipital adenopathy present.       Head (left side): No submental, no submandibular, no tonsillar, no preauricular, no posterior auricular and no occipital adenopathy present.     She has no cervical adenopathy.       Right: No supraclavicular adenopathy present.  Left: No supraclavicular adenopathy present.  Neurological: She is alert and oriented to person, place, and time.  Skin: Skin is warm and dry.  Psychiatric: She has a normal mood and affect.  Vitals reviewed.   No results found for this or any previous visit (from the past 24 hour(s)).   Assessment and Plan :  1. Impacted cerumen of left ear Improved with ear lavage. - Ear wax removal  2. Nasal congestion - fluticasone (FLONASE) 50 MCG/ACT nasal spray; Place 2 sprays into both nostrils daily.  Dispense: 16 g; Refill: 3 3. Cough 4. Acute frontal sinusitis, recurrence not specified Due to duration and worsening symptoms, concern for secondary infection. Will cover empirically for bacterial etiology at this time. Instructed to return to clinic if symptoms worsen, do not improve in 10 days, or as needed - chlorpheniramine-HYDROcodone (TUSSIONEX PENNKINETIC ER) 10-8 MG/5ML SUER; Take 5 mLs by mouth every 12 (twelve) hours as needed for cough.  Dispense: 100 mL; Refill: 0 - doxycycline (VIBRAMYCIN) 100 MG capsule; Take 1 capsule (100 mg total) by mouth 2 (two) times daily.  Dispense: 20 capsule; Refill: 0  Tenna Delaine, PA-C  [facility] 12/15/2016 2:21 PM

## 2016-12-15 NOTE — Patient Instructions (Addendum)
For cough and sinus pressure, please continue symptomatic relief with flonase and tussionex. Be aware that cough syrup can definitely make you drowsy and sleepy so do not drive or operate any heavy machinery if it is affecting you during the day. You may also use Tylenol or ibuprofen over-the-counter for your sore throat.  You can also use OTC afrin for nasal congestion for the next three days along with flonase but then stop using the afrin after 3 days because if you use it longer it can cause rebound effect and make things worse.  I recommend you rest, drink plenty of fluids, eat light meals including soups. I also recommend taking doxycycline to cover for any underlying bacterial etiology as your symptoms have persisted for over a week and gotten worse with symptomatic treatment. Be aware that taking doxycycline can increase your risk for sun burn so make sure you are using sunscreen while on this medication. You should be feeling better over the next 24-48 hours. Please return to clinic if symptoms worsen, do not improve in 10 days, or as needed       Cough, Adult A cough helps to clear your throat and lungs. A cough may last only 2-3 weeks (acute), or it may last longer than 8 weeks (chronic). Many different things can cause a cough. A cough may be a sign of an illness or another medical condition. Follow these instructions at home:  Pay attention to any changes in your cough.  Take medicines only as told by your doctor. ? If you were prescribed an antibiotic medicine, take it as told by your doctor. Do not stop taking it even if you start to feel better. ? Talk with your doctor before you try using a cough medicine.  Drink enough fluid to keep your pee (urine) clear or pale yellow.  If the air is dry, use a cold steam vaporizer or humidifier in your home.  Stay away from things that make you cough at work or at home.  If your cough is worse at night, try using extra pillows to raise  your head up higher while you sleep.  Do not smoke, and try not to be around smoke. If you need help quitting, ask your doctor.  Do not have caffeine.  Do not drink alcohol.  Rest as needed. Contact a doctor if:  You have new problems (symptoms).  You cough up yellow fluid (pus).  Your cough does not get better after 2-3 weeks, or your cough gets worse.  Medicine does not help your cough and you are not sleeping well.  You have pain that gets worse or pain that is not helped with medicine.  You have a fever.  You are losing weight and you do not know why.  You have night sweats. Get help right away if:  You cough up blood.  You have trouble breathing.  Your heartbeat is very fast. This information is not intended to replace advice given to you by your health care provider. Make sure you discuss any questions you have with your health care provider. Document Released: 03/04/2011 Document Revised: 11/27/2015 Document Reviewed: 08/28/2014 Elsevier Interactive Patient Education  2018 [employer].     Sinusitis, Adult Sinusitis is soreness and inflammation of your sinuses. Sinuses are hollow spaces in the bones around your face. They are located:  Around your eyes.  In the middle of your forehead.  Behind your nose.  In your cheekbones.  Your sinuses and nasal passages  are lined with a stringy fluid (mucus). Mucus normally drains out of your sinuses. When your nasal tissues get inflamed or swollen, the mucus can get trapped or blocked so air cannot flow through your sinuses. This lets bacteria, viruses, and funguses grow, and that leads to infection. Follow these instructions at home: Medicines  Take, use, or apply over-the-counter and prescription medicines only as told by your doctor. These may include nasal sprays.  If you were prescribed an antibiotic medicine, take it as told by your doctor. Do not stop taking the antibiotic even if you start to feel  better. Hydrate and Humidify  Drink enough water to keep your pee (urine) clear or pale yellow.  Use a cool mist humidifier to keep the humidity level in your home above 50%.  Breathe in steam for 10-15 minutes, 3-4 times a day or as told by your doctor. You can do this in the bathroom while a hot shower is running.  Try not to spend time in cool or dry air. Rest  Rest as much as possible.  Sleep with your head raised (elevated).  Make sure to get enough sleep each night. General instructions  Put a warm, moist washcloth on your face 3-4 times a day or as told by your doctor. This will help with discomfort.  Wash your hands often with soap and water. If there is no soap and water, use hand sanitizer.  Do not smoke. Avoid being around people who are smoking (secondhand smoke).  Keep all follow-up visits as told by your doctor. This is important. Contact a doctor if:  You have a fever.  Your symptoms get worse.  Your symptoms do not get better within 10 days. Get help right away if:  You have a very bad headache.  You cannot stop throwing up (vomiting).  You have pain or swelling around your face or eyes.  You have trouble seeing.  You feel confused.  Your neck is stiff.  You have trouble breathing. This information is not intended to replace advice given to you by your health care provider. Make sure you discuss any questions you have with your health care provider. Document Released: 12/08/2007 Document Revised: 02/15/2016 Document Reviewed: 04/16/2015 Elsevier Interactive Patient Education  2018 [employer].   IF you received an x-ray today, you will receive an invoice from [facility]. Please contact [facility] at [phone] with questions or concerns regarding your invoice.   IF you received labwork today, you will receive an invoice from [place]. Please contact [facility] at [phone] with questions or concerns regarding your  invoice.   Our billing staff will not be able to assist you with questions regarding bills from these companies.  You will be contacted with the lab results as soon as they are available. The fastest way to get your results is to activate your My Chart account. Instructions are located on the last page of this paperwork. If you have not heard from [place] regarding the results in 2 weeks, please contact this office.

## 2016-12-17 ENCOUNTER — Ambulatory Visit: Payer: Medicare Other | Admitting: Physician Assistant

## 2016-12-21 ENCOUNTER — Telehealth: Payer: Self-pay | Admitting: Physician Assistant

## 2016-12-21 MED ORDER — AMOXICILLIN-POT CLAVULANATE 875-125 MG PO TABS: 1.0000 | ORAL_TABLET | Freq: Two times a day (BID) | ORAL | 0 refills | Status: DC

## 2016-12-21 NOTE — Telephone Encounter (Signed)
Pt called stating that she believes she is having an allergic reaction to the Doxycycline.  She states that she is experiencing a burning sensation in her throat all the way down to her stomach when she takes it.  I advised pt to stop taking medication.  Please advise with something else she can take.  Pt uses [employer] on [address]. [phone]

## 2016-12-21 NOTE — Telephone Encounter (Signed)
[place], please see this message and advised. Thanks.

## 2016-12-21 NOTE — Telephone Encounter (Signed)
Contacted patient. She notes she has been having acid reflux sensation after starting doxycycline. She notes she felt as if the pill was stuck and was having pain in the center of her chest when she tries to eat. She was taking the tablet with only a small cup of water and admits to lying down pretty soon after taking it at night time. She took 6 days worth. Last tablet was yesterday, symptoms have improved. Denies exertional chest pain, SOB, palpitations, radiation, nausea, vomiting, and dizziness. Has tried tums with mild relief. Informed her that this is likely esophagitis associated with doxycycline use. Instructed to d/c doxy. She has some Esomeprazole from prior esophagitis. I encouraged her to take that as prescribed for the next few days. If any of her symptoms or she develops any concerning symptoms discussed above, seek care immediately at the ED. In terms of her sinus pain and cough, they have both completely resolved.

## 2017-01-03 ENCOUNTER — Telehealth: Payer: Self-pay | Admitting: Internal Medicine

## 2017-01-03 ENCOUNTER — Encounter: Payer: Self-pay | Admitting: Internal Medicine

## 2017-01-03 NOTE — Telephone Encounter (Signed)
Sure

## 2017-01-03 NOTE — Telephone Encounter (Signed)
Colonoscopy scheduled.

## 2017-01-03 NOTE — Telephone Encounter (Signed)
Dr. Henrene Pastor  Patient's last colon was 02/08/2007. Can I go ahead and schedule Recall Colon instead of Office Visit?   Thank You Steph

## 2017-01-03 NOTE — Telephone Encounter (Signed)
Yes

## 2017-01-04 ENCOUNTER — Telehealth: Payer: Self-pay

## 2017-01-04 NOTE — Telephone Encounter (Signed)
Pt called to check if she is allowed to take turmeric to help with inflammation with her joints. Her friend suggested this and would like to try herself. Checked for interactions with letrozole and turmeric and no interaction found. Should be okay to take as a supplement. Pt very grateful and will try this out. Told pt to take with food and to be cautious as it may cause some GI upset and some diarrhea. Pt verbalized understanding.

## 2017-02-17 ENCOUNTER — Ambulatory Visit (AMBULATORY_SURGERY_CENTER): Payer: Self-pay

## 2017-02-17 VITALS — Ht 63.5 in | Wt 159.8 lb

## 2017-02-17 DIAGNOSIS — Z1211 Encounter for screening for malignant neoplasm of colon: Secondary | ICD-10-CM

## 2017-02-17 MED ORDER — SUPREP BOWEL PREP KIT 17.5-3.13-1.6 GM/177ML PO SOLN: 1.0000 | Freq: Once | ORAL | 0 refills | Status: AC

## 2017-02-17 NOTE — Progress Notes (Signed)
No allergies to eggs or soy No diet meds No home oxygen No past problems with anesthesia  Declined emmi 

## 2017-02-23 ENCOUNTER — Other Ambulatory Visit: Payer: Self-pay | Admitting: Hematology and Oncology

## 2017-02-23 DIAGNOSIS — Z9889 Other specified postprocedural states: Secondary | ICD-10-CM

## 2017-03-08 ENCOUNTER — Ambulatory Visit (AMBULATORY_SURGERY_CENTER): Payer: Medicare Other | Admitting: Internal Medicine

## 2017-03-08 ENCOUNTER — Encounter: Payer: Self-pay | Admitting: Internal Medicine

## 2017-03-08 VITALS — BP 147/78 | HR 60 | Temp 97.5°F | Resp 10 | Ht 63.5 in | Wt 159.0 lb

## 2017-03-08 DIAGNOSIS — Z1211 Encounter for screening for malignant neoplasm of colon: Secondary | ICD-10-CM | POA: Diagnosis not present

## 2017-03-08 MED ORDER — SODIUM CHLORIDE 0.9 % IV SOLN: 500.0000 mL | INTRAVENOUS | Status: DC

## 2017-03-08 NOTE — Op Note (Signed)
[facility] Patient Name: Tami [name] Procedure Date: 03/08/2017 10:56 AM MRN: [id number] Endoscopist: Docia Chuck. Henrene Pastor , MD Age: 66 Referring MD:  Date of Birth: [birth year] Gender: Female Account #: [id number] Procedure:                Colonoscopy Indications:              Screening for colorectal malignant neoplasm.                            Previous examinations (Dr. Verl Blalock) 1990,                            1992, 2002, and 2008 all negative for neoplasia Medicines:                Monitored Anesthesia Care Procedure:                Pre-Anesthesia Assessment:                           - Prior to the procedure, a History and Physical                            was performed, and patient medications and                            allergies were reviewed. The patient's tolerance of                            previous anesthesia was also reviewed. The risks                            and benefits of the procedure and the sedation                            options and risks were discussed with the patient.                            All questions were answered, and informed consent                            was obtained. Prior Anticoagulants: The patient has                            taken no previous anticoagulant or antiplatelet                            agents. ASA Grade Assessment: II - A patient with                            mild systemic disease. After reviewing the risks                            and benefits, the patient was deemed in  satisfactory condition to undergo the procedure.                           After obtaining informed consent, the colonoscope                            was passed under direct vision. Throughout the                            procedure, the patient's blood pressure, pulse, and                            oxygen saturations were monitored continuously. The                            Colonoscope was  introduced through the anus and                            advanced to the the cecum, identified by                            appendiceal orifice and ileocecal valve. The                            ileocecal valve, appendiceal orifice, and rectum                            were photographed. The quality of the bowel                            preparation was excellent. The colonoscopy was                            performed without difficulty. The patient tolerated                            the procedure well. The bowel preparation used was                            SUPREP. Scope In: 11:17:45 AM Scope Out: 11:28:44 AM Scope Withdrawal Time: 0 hours 8 minutes 42 seconds  Total Procedure Duration: 0 hours 10 minutes 59 seconds  Findings:                 Internal hemorrhoids were found during                            retroflexion. The hemorrhoids were small.                           The exam was otherwise without abnormality on                            direct and retroflexion views. Complications:            No immediate complications. Estimated blood  loss:                            None. Estimated Blood Loss:     Estimated blood loss: none. Impression:               - Internal hemorrhoids.                           - The examination was otherwise normal on direct                            and retroflexion views.                           - No specimens collected. Recommendation:           - Repeat colonoscopy in 10 years for screening                            purposes.                           - Patient has a contact number available for                            emergencies. The signs and symptoms of potential                            delayed complications were discussed with the                            patient. Return to normal activities tomorrow.                            Written discharge instructions were provided to the                            patient.                            - Resume previous diet.                           - Continue present medications. Docia Chuck. Henrene Pastor, MD 03/08/2017 11:32:56 AM This report has been signed electronically.

## 2017-03-08 NOTE — Patient Instructions (Signed)
YOU HAD AN ENDOSCOPIC PROCEDURE TODAY AT THE [facility]:   Refer to the procedure report that was given to you for any specific questions about what was found during the examination.  If the procedure report does not answer your questions, please call your gastroenterologist to clarify.  If you requested that your care partner not be given the details of your procedure findings, then the procedure report has been included in a sealed envelope for you to review at your convenience later.  YOU SHOULD EXPECT: Some feelings of bloating in the abdomen. Passage of more gas than usual.  Walking can help get rid of the air that was put into your GI tract during the procedure and reduce the bloating. If you had a lower endoscopy (such as a colonoscopy or flexible sigmoidoscopy) you may notice spotting of blood in your stool or on the toilet paper. If you underwent a bowel prep for your procedure, you may not have a normal bowel movement for a few days.  Please Note:  You might notice some irritation and congestion in your nose or some drainage.  This is from the oxygen used during your procedure.  There is no need for concern and it should clear up in a day or so.  SYMPTOMS TO REPORT IMMEDIATELY:   Following lower endoscopy (colonoscopy or flexible sigmoidoscopy):  Excessive amounts of blood in the stool  Significant tenderness or worsening of abdominal pains  Swelling of the abdomen that is new, acute  Fever of 100F or higher  For urgent or emergent issues, a gastroenterologist can be reached at any hour by calling [phone].   DIET:  We do recommend a small meal at first, but then you may proceed to your regular diet.  Drink plenty of fluids but you should avoid alcoholic beverages for 24 hours.  MEDICATIONS: Continue present medications.  Please see handouts given to you by your recovery nurse.  ACTIVITY:  You should plan to take it easy for the rest of today and you should NOT  DRIVE or use heavy machinery until tomorrow (because of the sedation medicines used during the test).    FOLLOW UP: Our staff will call the number listed on your records the next business day following your procedure to check on you and address any questions or concerns that you may have regarding the information given to you following your procedure. If we do not reach you, we will leave a message.  However, if you are feeling well and you are not experiencing any problems, there is no need to return our call.  We will assume that you have returned to your regular daily activities without incident.  If any biopsies were taken you will be contacted by phone or by letter within the next 1-3 weeks.  Please call us at [phone] if you have not heard about the biopsies in 3 weeks.   Thank you for allowing us to provide for your healthcare needs today.   SIGNATURES/CONFIDENTIALITY: You and/or your care partner have signed paperwork which will be entered into your electronic medical record.  These signatures attest to the fact that that the information above on your After Visit Summary has been reviewed and is understood.  Full responsibility of the confidentiality of this discharge information lies with you and/or your care-partner. 

## 2017-03-08 NOTE — Progress Notes (Signed)
Pt's states no medical or surgical changes since previsit or office visit. 

## 2017-03-08 NOTE — Progress Notes (Signed)
To recovery, nreport to [place], Therapist, sports, VSS

## 2017-03-09 ENCOUNTER — Telehealth: Payer: Self-pay

## 2017-03-09 NOTE — Telephone Encounter (Signed)
  Follow up Call-  Call back number 03/08/2017  Post procedure Call Back phone  # [phone]  Permission to leave phone message Yes  Some recent data might be hidden     Patient questions:  Do you have a fever, pain , or abdominal swelling? No. Pain Score  0 *  Have you tolerated food without any problems? Yes.    Have you been able to return to your normal activities? Yes.    Do you have any questions about your discharge instructions: Diet   No. Medications  No. Follow up visit  No.  Do you have questions or concerns about your Care? No.  Actions: * If pain score is 4 or above: No action needed, pain <4.  No problems noted per pt. maw

## 2017-03-11 ENCOUNTER — Other Ambulatory Visit: Payer: Self-pay | Admitting: Cardiovascular Disease

## 2017-03-11 ENCOUNTER — Ambulatory Visit
Admission: RE | Admit: 2017-03-11 | Discharge: 2017-03-11 | Disposition: A | Discharge status: Home / Self Care | Payer: Medicare Other | Source: Ambulatory Visit | Attending: Hematology and Oncology | Admitting: Hematology and Oncology

## 2017-03-11 DIAGNOSIS — Z9889 Other specified postprocedural states: Secondary | ICD-10-CM

## 2017-03-11 HISTORY — DX: Personal history of irradiation: Z92.3

## 2017-03-11 HISTORY — DX: Personal history of antineoplastic chemotherapy: Z92.21

## 2017-03-11 HISTORY — DX: Malignant neoplasm of unspecified site of unspecified female breast: C50.919

## 2017-03-11 NOTE — Telephone Encounter (Signed)
Rx has been sent to the pharmacy electronically. ° °

## 2017-03-16 ENCOUNTER — Other Ambulatory Visit: Payer: Self-pay | Admitting: Cardiovascular Disease

## 2017-03-16 ENCOUNTER — Telehealth: Payer: Self-pay

## 2017-03-16 ENCOUNTER — Other Ambulatory Visit: Payer: Self-pay | Admitting: *Deleted

## 2017-03-16 NOTE — Telephone Encounter (Signed)
Called pt to schedule medicare annual wellness visit. Pt reports remembering that her insurance came out recently and thinks that they may have done the wellness visit already. She is going to check and let us know if she needs to schedule her awv.    Josepha Pigg, B.A.  Care Guide [phone]

## 2017-03-16 NOTE — Telephone Encounter (Signed)
Called pt to schedule Medicare Annual Wellness Visit with nurse health advisor. -nr   Tami [name], B.A.  Care Guide [phone]

## 2017-03-31 ENCOUNTER — Other Ambulatory Visit: Payer: Self-pay | Admitting: Obstetrics & Gynecology

## 2017-03-31 DIAGNOSIS — E2839 Other primary ovarian failure: Secondary | ICD-10-CM

## 2017-03-31 DIAGNOSIS — M810 Age-related osteoporosis without current pathological fracture: Secondary | ICD-10-CM

## 2017-04-04 ENCOUNTER — Encounter: Payer: Self-pay | Admitting: Physician Assistant

## 2017-04-04 ENCOUNTER — Ambulatory Visit (INDEPENDENT_AMBULATORY_CARE_PROVIDER_SITE_OTHER): Payer: Medicare Other | Admitting: Physician Assistant

## 2017-04-04 VITALS — BP 142/88 | HR 70 | Temp 98.4°F | Resp 18 | Ht 63.39 in | Wt 156.8 lb

## 2017-04-04 DIAGNOSIS — Z0000 Encounter for general adult medical examination without abnormal findings: Secondary | ICD-10-CM | POA: Diagnosis not present

## 2017-04-04 DIAGNOSIS — Z23 Encounter for immunization: Secondary | ICD-10-CM

## 2017-04-04 NOTE — Progress Notes (Signed)
Presents today for [employer] Visit-Subsequent.   Date of last exam: 2017  Interpreter used for this visit? no  Patient Care Team: Donzetta Kohut as PCP - General (Physician Assistant) Terance Ice, MD (Cardiology) Carolan Clines, MD as Attending Physician (Urology) Azucena Fallen, MD as Consulting Physician (Obstetrics and Gynecology)  Other items to address today: none  Cancer Screening: Cervical: yes, not a candidate anymore Breast: yes. 03/11/2017 Colon: yes, 03/08/2017  Skin: yes, 2016  Other Screening: Last screening for diabetes: 07/28/2016 Last lipid screening: 07/28/2016 Osteoporosis: last DEXA was 2014, has one scheduled for 04/19/17.  ADVANCE DIRECTIVES: Discussed: yes, not yet completed.  On File: no Materials Provided: yes  Immunization status:  Immunization History  Administered Date(s) Administered  . Influenza, Seasonal, Injecte, Preservative Fre 07/27/2012  . Influenza,inj,Quad PF,6+ Mos 05/23/2013, 05/17/2014, 06/06/2015   Health Maintenance Due  Topic Date Due  . TETANUS/TDAP  02/21/1969  . PNA vac Low Risk Adult (1 of 2 - PCV13) 02/22/2015  . INFLUENZA VACCINE  02/02/2017   Functional Status Survey: Is the patient deaf or have difficulty hearing?: No Does the patient have difficulty seeing, even when wearing glasses/contacts?: No Does the patient have difficulty concentrating, remembering, or making decisions?: No Does the patient have difficulty walking or climbing stairs?: Yes Does the patient have difficulty dressing or bathing?: No Does the patient have difficulty doing errands alone such as visiting a doctor's office or shopping?: No   Home Environment: Safe home environments. Lives at home with husband and oldest daughter. Has a rug by her bed that she has tripped over a few times. Wears   Urinary Incontinence Screening: Has been followed by a urologist. Goes to the bathroom about 4 times a night,  this has been improved over the past year. Has attended pelvic floor PT. Does her own pelvic floor exercises at home. Has stress incontinence, which has also improved. Denies urge incontinence.   Patient Active Problem List   Diagnosis Date Noted  . Depression 04/23/2016  . Nausea without vomiting 04/23/2016  . Trigger finger, acquired 03/26/2016  . Swelling of both ankles 12/18/2015  . First degree heart block 12/18/2015  . Abdominal pain   . Abnormal LFTs   . Oral candidiasis 05/14/2014  . Dehydration 05/14/2014  . Generalized pain 05/14/2014  . PAF (paroxysmal atrial fibrillation) ([place]) 05/14/2014  . PUD (peptic ulcer disease) 05/14/2014  . Transaminitis 05/14/2014  . Normocytic anemia 05/14/2014  . Epigastric abdominal pain 05/14/2014  . Family history of malignant neoplasm of breast   . Breast cancer of lower-inner quadrant of right female breast ([place]) 02/14/2014  . Hyperlipidemia 05/23/2013  . Caregiver stress 10/23/2012  . Osteoporosis, unspecified 08/24/2012  . DDD (degenerative disc disease), lumbar 08/23/2012  . Female stress incontinence 05/28/2012  . Hydronephrosis 05/28/2012  . Numbness and tingling of right arm and leg 08/23/2011  . Essential hypertension 09/14/2010  . Abdominal pain, epigastric 09/14/2010  . IRRITABLE BOWEL SYNDROME 12/29/2009  . OSTEOARTHRITIS 12/29/2009  . 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 PSVT 07/11/2008  . GERD 06/24/2000  . CANDIDIASIS, ESOPHAGEAL 04/19/1995  . ASTHMA UNSPECIFIED WITH EXACERBATION 04/19/1995     Past Medical History:  Diagnosis Date  . Anemia    as a child  . Anxiety   . Arthritis   . Asthma   . Atrial fibrillation ([place])   . Breast cancer ([place]) 2015  . Dysrhythmia    hx AF  . Family history of malignant  neoplasm of breast   . Heart palpitations   . Heartburn   . Hyperlipidemia   . Invasive ductal carcinoma of right breast ([place]) 02/08/14   Lower Inner Quadrant  . Irregular heartbeat   . Malignant  neoplasm of breast (female), unspecified site   . Numbness of arm    Right Arm  . Osteoporosis   . Personal history of chemotherapy 2015   Right Breast Cancer  . Personal history of radiation therapy 2015   Right Breast Cancer  . Pure hypercholesterolemia   . SVT (supraventricular tachycardia) ([facility])      Past Surgical History:  Procedure Laterality Date  . 30 Day Monitor  04/10/2009 to 05/23/2009   No End Of Summary Report Atrial Fib,Narrow Complex Tachycardia,Sinus Rhythm   . ABDOMINAL HYSTERECTOMY  1987  . BREAST LUMPECTOMY Right 2015  . BREAST LUMPECTOMY WITH NEEDLE LOCALIZATION AND AXILLARY SENTINEL LYMPH NODE BX Right 04/02/2014   Procedure: Right Axillary Lymphatic Mapping; Right axillary Sentinal Node Biopsy; Right Partial Mastectomy after wire localization;  Surgeon: Jackolyn Confer, MD;  Location: [facility];  Service: General;  Laterality: Right;  . BREAST SURGERY     Left  . CYSTOSCOPY  04/25/2012   Pt may be a candidate for research anti-incontinence procedure protocol  . Dipyridamole Myoview  05/27/2009   The post stress myocardial perfusion images show a normal pattern of perfusion in all regions.The post stress ejection fraction is 83 %. Normal  myocardial perfusion imaging This is a low risk scan.  . DOPPLER ECHOCARDIOGRAPHY  08/21/2010   Ejecion Fraction =>55% All chambers are normal in size and function No significant change from 2010.  [name] lower Venous Dopplers  04/11/2009   Normal bilateral lower ext venous duplex  . PORTACATH PLACEMENT N/A 04/26/2014   Procedure: ULTRASOUND GUIDED PORT A CATHE INSERTION;  Surgeon: Jackolyn Confer, MD;  Location: [facility];  Service: General;  Laterality: N/A;  . Upper Arterial Dopplers  06/02/2011   Right Side PPG Demonstrates siginificantly diminshed waveforms with the Adson's maneuver which may suggest Thoracic Outlet Syndrone. This is a abnormal thoracic outlet arterial evaluation.     Family History  Problem Relation Age of Onset  .  Cancer Mother        skin cancer  . Diabetes Sister        Borderline  . Hypertension Sister   . Heart attack Sister   . Breast cancer Sister 67       maternal half-sister; deceased 48  . Diabetes Brother        Borderline; maternal half-brother  . Cancer Maternal Aunt        stomach and throat; heavy smoker and drinker; deceased 64  . Stomach cancer Maternal Aunt   . Cancer Other        breast/ovarian; daughter of mat cousin with unk. primary at 58; pt's 1st cousin-once-removed  . Cancer Cousin 72       unk. primary; daughter of mat aunt with stomach/throat ca  . Colon cancer Neg Hx   . Esophageal cancer Neg Hx   . Rectal cancer Neg Hx      Social History   Social History  . Marital status: Married    Spouse name: Gwyndolyn Saxon   . Number of children: 2  . Years of education: N/A   Occupational History  . Not on file.   Social History Main Topics  . Smoking status: Never Smoker  . Smokeless tobacco: Never Used  . Alcohol use No  .  Drug use: No  . Sexual activity: Yes    Partners: Male     Comment: number of sex partners in the last 43 months 1   Other Topics Concern  . Not on file   Social History Narrative   Exercise: walks on treadmill three times a week for 30-45 minutes at a time        Allergies  Allergen Reactions  . Latex Swelling and Rash    Skin swelling   . Sulfonamide Derivatives Anaphylaxis, Shortness Of Breath, Nausea Only and Other (See Comments)    Sweating, headache   . Clarithromycin Nausea Only and Other (See Comments)    Headache   . Crestor [Rosuvastatin] Other (See Comments)    Muscle aches, pain, weakness in lower extremities  . Livalo [Pitavastatin] Other (See Comments)    Leg pain  . Statins Other (See Comments)    Muscle soreness and weakness w/ Rosuvastatin and Zetia.  . Ciprofloxacin Hcl Nausea Only, Palpitations and Other (See Comments)    Headache      Prior to Admission medications   Medication Sig Start Date End Date  Taking? Authorizing Provider  albuterol (PROVENTIL HFA;VENTOLIN HFA) 108 (90 Base) MCG/ACT inhaler Inhale 2 puffs into the lungs every 4 (four) hours as needed (cough, shortness of breath or wheezing.). 06/08/16  Yes Timmothy Euler, [place] D, PA-C  amitriptyline (ELAVIL) 25 MG tablet Take 25 mg by mouth at bedtime as needed for sleep. When patient has migraines   Yes [provider]  aspirin EC 81 MG tablet Take 81 mg by mouth daily.   Yes [provider]  Calcium Carbonate-Vitamin D (CALTRATE 600+D PO) Take by mouth.   Yes [provider]  cetirizine (ZYRTEC) 10 MG tablet Take 10 mg by mouth daily.   Yes [provider]  Cholecalciferol (VITAMIN D3) 5000 units CAPS Take 10,000 Units by mouth 2 (two) times daily.   Yes [provider]  flecainide (TAMBOCOR) 100 MG tablet TAKE ONE-HALF TABLET BY MOUTH TWICE DAILY 03/16/17  Yes Troy Sine, MD  fluticasone [facility]) 50 MCG/ACT nasal spray Place 2 sprays into both nostrils daily. 12/15/16  Yes Timmothy Euler, [place] D, PA-C  ibuprofen (ADVIL,MOTRIN) 200 MG tablet Take 200 mg by mouth every 6 (six) hours as needed for headache or moderate pain.   Yes [provider]  letrozole (FEMARA) 2.5 MG tablet Take 1 tablet (2.5 mg total) by mouth daily. 11/08/16  Yes Nicholas Lose, MD  Meth-Hyo-M Barnett Hatter Phos-Ph Sal (URIBEL) 118 MG CAPS Take 118 mg by mouth 4 (four) times daily as needed (for bladder). Reported on 09/09/2015 03/22/14  Yes [provider]  metoprolol succinate (TOPROL-XL) 50 MG 24 hr tablet Take 1 tablet (50 mg total) by mouth daily. Need appointment before future refills 03/11/17  Yes Minus Breeding, MD  ondansetron (ZOFRAN) 8 MG tablet Take 1 tablet (8 mg total) by mouth every 8 (eight) hours as needed for nausea or vomiting. 04/22/16  Yes Curcio, Roselie Awkward, NP  PARoxetine (PAXIL) 30 MG tablet Take 1 tablet (30 mg total) by mouth daily. 04/22/16  Yes Curcio, Roselie Awkward, NP  vitamin B-12 (CYANOCOBALAMIN)  1000 MCG tablet Take 1,000 mcg by mouth daily.   Yes [provider]     Depression screen [facility] 2/9 04/04/2017 12/15/2016 06/03/2016 09/09/2015 07/10/2014  Decreased Interest 0 0 0 0 0  Down, Depressed, Hopeless 0 0 0 0 0  PHQ - 2 Score 0 0 0 0 0  Some recent  data might be hidden     Fall Risk  04/04/2017 12/15/2016 06/03/2016 09/09/2015 07/17/2014  Falls in the past year? No No No No No   Cognition function screen: 3 object recall intact, 3 object identification intact.   PHYSICAL EXAM: BP (!) 154/80 (BP Location: Left Arm, Patient Position: Sitting, Cuff Size: Normal)   Pulse 70   Temp 98.4 F (36.9 C) (Oral)   Resp 18   Ht 5' 3.39" (1.61 m)   Wt 156 lb 12.8 oz (71.1 kg)   LMP 07/06/1985 (Approximate)   SpO2 97%   BMI 27.44 kg/m    Wt Readings from Last 3 Encounters:  04/04/17 156 lb 12.8 oz (71.1 kg)  03/08/17 159 lb (72.1 kg)  02/17/17 159 lb 12.8 oz (72.5 kg)    Visual Acuity Screening   Right eye Left eye Both eyes  Without correction:     With correction: 20/25 20/30 20/20    Physical Exam  Constitutional: She appears well-developed and well-nourished. No distress.  HENT:  Head: Normocephalic and atraumatic.  Right Ear: Tympanic membrane, external ear and ear canal normal.  Left Ear: Tympanic membrane, external ear and ear canal normal.  Nose: No mucosal edema.  Mouth/Throat: Uvula is midline, oropharynx is clear and moist and mucous membranes are normal.  Eyes: Pupils are equal, round, and reactive to light. Conjunctivae, EOM and lids are normal.  Neck: Full passive range of motion without pain. No thyroid mass and no thyromegaly present.  Cardiovascular: Normal rate, regular rhythm, normal heart sounds and intact distal pulses.   Respiratory: Effort normal. She has no wheezes. She has no rhonchi. She has no rales.  GI: Soft. Normal appearance and bowel sounds are normal. There is no tenderness.  Musculoskeletal: Normal range of motion.  Lymphadenopathy:        Head (right side): No submental, no submandibular, no tonsillar, no preauricular, no posterior auricular and no occipital adenopathy present.       Head (left side): No submental, no submandibular, no tonsillar, no preauricular, no posterior auricular and no occipital adenopathy present.    She has no cervical adenopathy.       Right: No supraclavicular adenopathy present.       Left: No supraclavicular adenopathy present.  Neurological: She is alert. She has normal strength.  Reflex Scores:      Tricep reflexes are 2+ on the right side and 2+ on the left side.      Bicep reflexes are 2+ on the right side and 2+ on the left side.      Brachioradialis reflexes are 2+ on the right side and 2+ on the left side.      Patellar reflexes are 2+ on the right side and 2+ on the left side.      Achilles reflexes are 2+ on the right side and 2+ on the left side. Skin: Skin is warm and dry.  Psychiatric: She has a normal mood and affect. Her speech is normal and behavior is normal. Judgment and thought content normal. Cognition and memory are normal.     Education/Counseling provided regarding diet and exercise, prevention of chronic diseases, smoking/tobacco cessation, if applicable, and reviewed "Covered Medicare Preventive Services."   ASSESSMENT/PLAN: 1. Medicare annual wellness visit, subsequent No abnormal findings on PE. Pt encouraged to remove rug from bedroom and continue wearing non slip shoes while ambulating at home. Pt had recent lab work at cardiologist's office and therefore does not need lab work today.She will obtain  bone density scan on 04/19/17.  Follow up in one year.  2. Need for influenza vaccination Encouraged to go to [facility] to receive high dose flu shot.  3. Need for pneumococcal vaccination - Pneumococcal conjugate vaccine 13-valent IM  Tenna Delaine, PA-C  [facility] [place] 04/04/2017 4:54 PM

## 2017-04-04 NOTE — Patient Instructions (Addendum)
In terms of home safety: I recommend you remove the rug from your bedroom floor and still continue wearing good gripped shoes while at home. You may continue walking on the treadmill as long as it is at a slow pace and someone is there with you.   In terms of immunizations: You are receiving a prevnar 13 today and will need the pneumovax 23 in one year. Please go to a nearby pharmacy to have the high dose flu shot. Call insurance to ask about Td vaccine coverage. Find records showing you had the shingles vaccine.  Otherwise, you are doing great and you can follow up in one year for medicare visit or as needed. Thank you for letting me participate in your health and well being.    Health Maintenance for Postmenopausal Women Menopause is a normal process in which your reproductive ability comes to an end. This process happens gradually over a span of months to years, usually between the ages of 100 and 86. Menopause is complete when you have missed 12 consecutive menstrual periods. It is important to talk with your health care provider about some of the most common conditions that affect postmenopausal women, such as heart disease, cancer, and bone loss (osteoporosis). Adopting a healthy lifestyle and getting preventive care can help to promote your health and wellness. Those actions can also lower your chances of developing some of these common conditions. What should I know about menopause? During menopause, you may experience a number of symptoms, such as:  Moderate-to-severe hot flashes.  Night sweats.  Decrease in sex drive.  Mood swings.  Headaches.  Tiredness.  Irritability.  Memory problems.  Insomnia.  Choosing to treat or not to treat menopausal changes is an individual decision that you make with your health care provider. What should I know about hormone replacement therapy and supplements? Hormone therapy products are effective for treating symptoms that are associated with  menopause, such as hot flashes and night sweats. Hormone replacement carries certain risks, especially as you become older. If you are thinking about using estrogen or estrogen with progestin treatments, discuss the benefits and risks with your health care provider. What should I know about heart disease and stroke? Heart disease, heart attack, and stroke become more likely as you age. This may be due, in part, to the hormonal changes that your body experiences during menopause. These can affect how your body processes dietary fats, triglycerides, and cholesterol. Heart attack and stroke are both medical emergencies. There are many things that you can do to help prevent heart disease and stroke:  Have your blood pressure checked at least every 1-2 years. High blood pressure causes heart disease and increases the risk of stroke.  If you are 69-62 years old, ask your health care provider if you should take aspirin to prevent a heart attack or a stroke.  Do not use any tobacco products, including cigarettes, chewing tobacco, or electronic cigarettes. If you need help quitting, ask your health care provider.  It is important to eat a healthy diet and maintain a healthy weight. ? Be sure to include plenty of vegetables, fruits, low-fat dairy products, and lean protein. ? Avoid eating foods that are high in solid fats, added sugars, or salt (sodium).  Get regular exercise. This is one of the most important things that you can do for your health. ? Try to exercise for at least 150 minutes each week. The type of exercise that you do should increase your heart rate  and make you sweat. This is known as moderate-intensity exercise. ? Try to do strengthening exercises at least twice each week. Do these in addition to the moderate-intensity exercise.  Know your numbers.Ask your health care provider to check your cholesterol and your blood glucose. Continue to have your blood tested as directed by your health  care provider.  What should I know about cancer screening? There are several types of cancer. Take the following steps to reduce your risk and to catch any cancer development as early as possible. Breast Cancer  Practice breast self-awareness. ? This means understanding how your breasts normally appear and feel. ? It also means doing regular breast self-exams. Let your health care provider know about any changes, no matter how small.  If you are 77 or older, have a clinician do a breast exam (clinical breast exam or CBE) every year. Depending on your age, family history, and medical history, it may be recommended that you also have a yearly breast X-ray (mammogram).  If you have a family history of breast cancer, talk with your health care provider about genetic screening.  If you are at high risk for breast cancer, talk with your health care provider about having an MRI and a mammogram every year.  Breast cancer (BRCA) gene test is recommended for women who have family members with BRCA-related cancers. Results of the assessment will determine the need for genetic counseling and BRCA1 and for BRCA2 testing. BRCA-related cancers include these types: ? Breast. This occurs in males or females. ? Ovarian. ? Tubal. This may also be called fallopian tube cancer. ? Cancer of the abdominal or pelvic lining (peritoneal cancer). ? Prostate. ? Pancreatic.  Cervical, Uterine, and Ovarian Cancer Your health care provider may recommend that you be screened regularly for cancer of the pelvic organs. These include your ovaries, uterus, and vagina. This screening involves a pelvic exam, which includes checking for microscopic changes to the surface of your cervix (Pap test).  For women ages 21-65, health care providers may recommend a pelvic exam and a Pap test every three years. For women ages 40-65, they may recommend the Pap test and pelvic exam, combined with testing for human papilloma virus (HPV),  every five years. Some types of HPV increase your risk of cervical cancer. Testing for HPV may also be done on women of any age who have unclear Pap test results.  Other health care providers may not recommend any screening for nonpregnant women who are considered low risk for pelvic cancer and have no symptoms. Ask your health care provider if a screening pelvic exam is right for you.  If you have had past treatment for cervical cancer or a condition that could lead to cancer, you need Pap tests and screening for cancer for at least 20 years after your treatment. If Pap tests have been discontinued for you, your risk factors (such as having a new sexual partner) need to be reassessed to determine if you should start having screenings again. Some women have medical problems that increase the chance of getting cervical cancer. In these cases, your health care provider may recommend that you have screening and Pap tests more often.  If you have a family history of uterine cancer or ovarian cancer, talk with your health care provider about genetic screening.  If you have vaginal bleeding after reaching menopause, tell your health care provider.  There are currently no reliable tests available to screen for ovarian cancer.  Lung Cancer Lung  cancer screening is recommended for adults 30-4 years old who are at high risk for lung cancer because of a history of smoking. A yearly low-dose CT scan of the lungs is recommended if you:  Currently smoke.  Have a history of at least 30 pack-years of smoking and you currently smoke or have quit within the past 15 years. A pack-year is smoking an average of one pack of cigarettes per day for one year.  Yearly screening should:  Continue until it has been 15 years since you quit.  Stop if you develop a health problem that would prevent you from having lung cancer treatment.  Colorectal Cancer  This type of cancer can be detected and can often be  prevented.  Routine colorectal cancer screening usually begins at age 34 and continues through age 61.  If you have risk factors for colon cancer, your health care provider may recommend that you be screened at an earlier age.  If you have a family history of colorectal cancer, talk with your health care provider about genetic screening.  Your health care provider may also recommend using home test kits to check for hidden blood in your stool.  A small camera at the end of a tube can be used to examine your colon directly (sigmoidoscopy or colonoscopy). This is done to check for the earliest forms of colorectal cancer.  Direct examination of the colon should be repeated every 5-10 years until age 92. However, if early forms of precancerous polyps or small growths are found or if you have a family history or genetic risk for colorectal cancer, you may need to be screened more often.  Skin Cancer  Check your skin from head to toe regularly.  Monitor any moles. Be sure to tell your health care provider: ? About any new moles or changes in moles, especially if there is a change in a mole's shape or color. ? If you have a mole that is larger than the size of a pencil eraser.  If any of your family members has a history of skin cancer, especially at a young age, talk with your health care provider about genetic screening.  Always use sunscreen. Apply sunscreen liberally and repeatedly throughout the day.  Whenever you are outside, protect yourself by wearing long sleeves, pants, a wide-brimmed hat, and sunglasses.  What should I know about osteoporosis? Osteoporosis is a condition in which bone destruction happens more quickly than new bone creation. After menopause, you may be at an increased risk for osteoporosis. To help prevent osteoporosis or the bone fractures that can happen because of osteoporosis, the following is recommended:  If you are 71-67 years old, get at least 1,000 mg of  calcium and at least 600 mg of vitamin D per day.  If you are older than age 39 but younger than age 44, get at least 1,200 mg of calcium and at least 600 mg of vitamin D per day.  If you are older than age 78, get at least 1,200 mg of calcium and at least 800 mg of vitamin D per day.  Smoking and excessive alcohol intake increase the risk of osteoporosis. Eat foods that are rich in calcium and vitamin D, and do weight-bearing exercises several times each week as directed by your health care provider. What should I know about how menopause affects my mental health? Depression may occur at any age, but it is more common as you become older. Common symptoms of depression include:  Low or sad mood.  Changes in sleep patterns.  Changes in appetite or eating patterns.  Feeling an overall lack of motivation or enjoyment of activities that you previously enjoyed.  Frequent crying spells.  Talk with your health care provider if you think that you are experiencing depression. What should I know about immunizations? It is important that you get and maintain your immunizations. These include:  Tetanus, diphtheria, and pertussis (Tdap) booster vaccine.  Influenza every year before the flu season begins.  Pneumonia vaccine.  Shingles vaccine.  Your health care provider may also recommend other immunizations. This information is not intended to replace advice given to you by your health care provider. Make sure you discuss any questions you have with your health care provider. Document Released: 08/13/2005 Document Revised: 01/09/2016 Document Reviewed: 03/25/2015 Elsevier Interactive Patient Education  2018 [employer].    IF you received an x-ray today, you will receive an invoice from [facility]. Please contact [facility] at [phone] with questions or concerns regarding your invoice.   IF you received labwork today, you will receive an invoice from [facility].  Please contact [facility] at [phone] with questions or concerns regarding your invoice.   Our billing staff will not be able to assist you with questions regarding bills from these companies.  You will be contacted with the lab results as soon as they are available. The fastest way to get your results is to activate your My Chart account. Instructions are located on the last page of this paperwork. If you have not heard from [place] regarding the results in 2 weeks, please contact this office.

## 2017-04-20 ENCOUNTER — Ambulatory Visit
Admission: RE | Admit: 2017-04-20 | Discharge: 2017-04-20 | Disposition: A | Discharge status: Home / Self Care | Payer: Medicare Other | Source: Ambulatory Visit | Attending: Obstetrics & Gynecology | Admitting: Obstetrics & Gynecology

## 2017-04-20 DIAGNOSIS — E2839 Other primary ovarian failure: Secondary | ICD-10-CM

## 2017-04-20 DIAGNOSIS — M810 Age-related osteoporosis without current pathological fracture: Secondary | ICD-10-CM

## 2017-05-09 ENCOUNTER — Encounter: Payer: Self-pay | Admitting: Physician Assistant

## 2017-05-09 ENCOUNTER — Ambulatory Visit (INDEPENDENT_AMBULATORY_CARE_PROVIDER_SITE_OTHER): Payer: Medicare Other | Admitting: Physician Assistant

## 2017-05-09 VITALS — BP 122/72 | HR 62 | Temp 98.6°F | Resp 18 | Ht 64.65 in | Wt 159.0 lb

## 2017-05-09 DIAGNOSIS — M25551 Pain in right hip: Secondary | ICD-10-CM

## 2017-05-09 DIAGNOSIS — M25552 Pain in left hip: Secondary | ICD-10-CM

## 2017-05-09 DIAGNOSIS — M6281 Muscle weakness (generalized): Secondary | ICD-10-CM

## 2017-05-09 DIAGNOSIS — M545 Low back pain: Secondary | ICD-10-CM

## 2017-05-09 DIAGNOSIS — G8929 Other chronic pain: Secondary | ICD-10-CM

## 2017-05-09 DIAGNOSIS — M2550 Pain in unspecified joint: Secondary | ICD-10-CM

## 2017-05-09 NOTE — Progress Notes (Signed)
Tami [name]  MRN: [id number] DOB: 1951/03/06  Subjective:  Tami [name] is a 66 y.o. female seen in office today for a chief complaint of bilateral leg weakness x few years. In the beginning, would almost fall every now and then now it is everyday. Has associated discomfort and soreness in thighs. Has lower back pain, right hip pain, and bilateral knee pain. Will have occasional numbness in right leg if she lies on it at night time. Denies muscle wasting, loss of ROM, saddle anesthesia, bladder/bowel incontinence. Notes she has had one minor fall in the past year.  For exercise, she is walking on the treadmill 3 times a week for 15-30 minutes. Denies weight bearing activity. She has been evaluated for low back pain in the past by homeopathic doctor about 2 years ago. She received steroid injections in her back and notes this did help with the weakness and pain.  Was doing physical therapy in 02/2017 for low back pain but they stopped due to her leg weakness.  She is not sure if she has been evaluated by neurology but definitely has not been in the past 2 years.  She has had both plain films and MRIs of low back and hip with no answer.   Has tried therapy exercises and walking with no relief.   No FH of autoimmune disease.   Review of Systems  Constitutional: Negative for chills, diaphoresis and fever.  Gastrointestinal: Negative for diarrhea and vomiting.  Musculoskeletal: Positive for arthralgias.  Skin: Negative for rash.  Psychiatric/Behavioral: Negative for agitation and confusion.    Patient Active Problem List   Diagnosis Date Noted  . Depression 04/23/2016  . Nausea without vomiting 04/23/2016  . Trigger finger, acquired 03/26/2016  . Swelling of both ankles 12/18/2015  . First degree heart block 12/18/2015  . Abdominal pain   . Abnormal LFTs   . Oral candidiasis 05/14/2014  . Dehydration 05/14/2014  . Generalized pain 05/14/2014  . PAF (paroxysmal atrial  fibrillation) ([place]) 05/14/2014  . PUD (peptic ulcer disease) 05/14/2014  . Transaminitis 05/14/2014  . Normocytic anemia 05/14/2014  . Epigastric abdominal pain 05/14/2014  . Family history of malignant neoplasm of breast   . Breast cancer of lower-inner quadrant of right female breast ([place]) 02/14/2014  . Hyperlipidemia 05/23/2013  . Caregiver stress 10/23/2012  . Osteoporosis, unspecified 08/24/2012  . DDD (degenerative disc disease), lumbar 08/23/2012  . Female stress incontinence 05/28/2012  . Hydronephrosis 05/28/2012  . Numbness and tingling of right arm and leg 08/23/2011  . Essential hypertension 09/14/2010  . Abdominal pain, epigastric 09/14/2010  . IRRITABLE BOWEL SYNDROME 12/29/2009  . OSTEOARTHRITIS 12/29/2009  . 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 PSVT 07/11/2008  . GERD 06/24/2000  . CANDIDIASIS, ESOPHAGEAL 04/19/1995  . ASTHMA UNSPECIFIED WITH EXACERBATION 04/19/1995    Current Outpatient Medications on File Prior to Visit  Medication Sig Dispense Refill  . albuterol (PROVENTIL HFA;VENTOLIN HFA) 108 (90 Base) MCG/ACT inhaler Inhale 2 puffs into the lungs every 4 (four) hours as needed (cough, shortness of breath or wheezing.). 1 Inhaler 3  . amitriptyline (ELAVIL) 25 MG tablet Take 25 mg by mouth at bedtime as needed for sleep. When patient has migraines    . aspirin EC 81 MG tablet Take 81 mg by mouth daily.    . Calcium Carbonate-Vitamin D (CALTRATE 600+D PO) Take by mouth.    . cetirizine (ZYRTEC) 10 MG tablet Take 10 mg by mouth daily.    . Cholecalciferol (VITAMIN D3)  5000 units CAPS Take 10,000 Units by mouth 2 (two) times daily.    . flecainide (TAMBOCOR) 100 MG tablet TAKE ONE-HALF TABLET BY MOUTH TWICE DAILY 90 tablet 2  . fluticasone (FLONASE) 50 MCG/ACT nasal spray Place 2 sprays into both nostrils daily. 16 g 3  . ibuprofen (ADVIL,MOTRIN) 200 MG tablet Take 200 mg by mouth every 6 (six) hours as needed for headache or moderate pain.    [name] letrozole  (FEMARA) 2.5 MG tablet Take 1 tablet (2.5 mg total) by mouth daily. 90 tablet 4  . Meth-Hyo-M Bl-Na Phos-Ph Sal (URIBEL) 118 MG CAPS Take 118 mg by mouth 4 (four) times daily as needed (for bladder). Reported on 09/09/2015  2  . metoprolol succinate (TOPROL-XL) 50 MG 24 hr tablet Take 1 tablet (50 mg total) by mouth daily. Need appointment before future refills 60 tablet 0  . ondansetron (ZOFRAN) 8 MG tablet Take 1 tablet (8 mg total) by mouth every 8 (eight) hours as needed for nausea or vomiting. 20 tablet 0  . PARoxetine (PAXIL) 30 MG tablet Take 1 tablet (30 mg total) by mouth daily. 30 tablet 5  . vitamin B-12 (CYANOCOBALAMIN) 1000 MCG tablet Take 1,000 mcg by mouth daily.     Current Facility-Administered Medications on File Prior to Visit  Medication Dose Route Frequency Provider Last Rate Last Dose  . 0.9 %  sodium chloride infusion  500 mL Intravenous Continuous Irene Shipper, MD        Allergies  Allergen Reactions  . Latex Swelling and Rash    Skin swelling   . Sulfonamide Derivatives Anaphylaxis, Shortness Of Breath, Nausea Only and Other (See Comments)    Sweating, headache   . Clarithromycin Nausea Only and Other (See Comments)    Headache   . Crestor [Rosuvastatin] Other (See Comments)    Muscle aches, pain, weakness in lower extremities  . Livalo [Pitavastatin] Other (See Comments)    Leg pain  . Statins Other (See Comments)    Muscle soreness and weakness w/ Rosuvastatin and Zetia.  . Ciprofloxacin Hcl Nausea Only, Palpitations and Other (See Comments)    Headache       Social History   Socioeconomic History  . Marital status: Married    Spouse name: Gwyndolyn Saxon   . Number of children: 2  . Years of education: Not on file  . Highest education level: Not on file  Social Needs  . Financial resource strain: Not on file  . Food insecurity - worry: Not on file  . Food insecurity - inability: Not on file  . Transportation needs - medical: Not on file  . Transportation  needs - non-medical: Not on file  Occupational History  . Not on file  Tobacco Use  . Smoking status: Never Smoker  . Smokeless tobacco: Never Used  Substance and Sexual Activity  . Alcohol use: No  . Drug use: No  . Sexual activity: Yes    Partners: Male    Comment: number of sex partners in the last 12 months 1  Other Topics Concern  . Not on file  Social History Narrative   Exercise: walks on treadmill three times a week for 30-45 minutes at a time    Objective:  BP 122/72 (BP Location: Left Arm, Patient Position: Sitting, Cuff Size: Normal)   Pulse 62   Temp 98.6 F (37 C) (Oral)   Resp 18   Ht 5' 4.65" (1.642 m)   Wt 159 lb (72.1 kg)  LMP 07/06/1985 (Approximate)   SpO2 100%   BMI 26.75 kg/m   Physical Exam  Constitutional: She is oriented to person, place, and time and well-developed, well-nourished, and in no distress.  HENT:  Head: Normocephalic and atraumatic.  Eyes: Conjunctivae are normal.  Neck: Normal range of motion.  Pulmonary/Chest: Effort normal.  Musculoskeletal:       Right hip: She exhibits decreased strength, tenderness ( With palpation of musculature) and bony tenderness ( with palpation of the greater trochanter ). She exhibits normal range of motion (strength 4/5 ), no swelling and no crepitus.       Left hip: She exhibits tenderness ( With palpation of musculature) and bony tenderness ( With palpation of greater trochanter). She exhibits normal range of motion, normal strength and no swelling.       Right knee: Normal. She exhibits normal range of motion and no swelling. No tenderness found.       Left knee: She exhibits normal range of motion and no swelling. No tenderness found.       Thoracic back: Normal.       Lumbar back: She exhibits decreased range of motion ( With forward flexion). She exhibits no tenderness, no bony tenderness and no swelling.       Right upper leg: She exhibits tenderness (with palpation of musculature). She exhibits  no swelling.       Left upper leg: She exhibits tenderness (with palpation of musculature). She exhibits no swelling.       Right lower leg: She exhibits no tenderness and no swelling.       Left lower leg: She exhibits no tenderness and no swelling.  Neurological: She is alert and oriented to person, place, and time. She has intact cranial nerves. She has a normal Straight Leg Raise Test. Gait (limp noted) abnormal.  Reflex Scores:      Patellar reflexes are 2+ on the right side and 2+ on the left side.      Achilles reflexes are 2+ on the right side and 2+ on the left side. Sensation of lower extremities intact. Muscular strength of right lower extremity is 4/5. Muscular strength of left lower extremities 5/5. 30 second chair stand test score is 3   Skin: Skin is warm and dry.  Psychiatric: Affect normal.  Vitals reviewed.    Assessment and Plan :  1. Muscle weakness Per chart review, patient has been seen in 2014 for similar issues and at that time they were persisting for about a year. Considering her symptoms are progressively worsening, further workup is warranted. Physical exam shows decreased strength in right side. 30 second chair stand test score is very poor for patient's age. She has diffuse tenderness in lower extremities bilaterally. Labs pending. I have also placed referral to neuro for EMG studies. I have asked pt if she wants home health to come out and evaluate her home for fall risk.  She declines at this time. I have given her information on fall prevention in the home. - CK - ANA - Rheumatoid factor - Sedimentation Rate - Vitamin B12 - Antistreptolysin O titer - C-reactive protein - CBC with Differential/Platelet - Ambulatory referral to Neurology - TSH - CYCLIC CITRUL PEPTIDE ANTIBODY, IGG/IGA  2. Pain of both hip joints 3. Chronic bilateral low back pain with sciatica presence unspecified 4. Arthralgia, unspecified joint  A total of 25 was spent in the room  with the patient, greater than 50% of which was in counseling/coordination  of care regarding muscle weakness, hip pain, leg pain, and joint pain.  Tenna Delaine PA-C  [facility] 05/09/2017 11:20 AM

## 2017-05-09 NOTE — Patient Instructions (Addendum)
For her symptoms, we are going to check lab work today.  We should have these results back in 1 week and will contact you with these results.  I have also placed a referral for neurology.  They should contact you in 1-2 weeks with an appointment.  Please follow-up with them as planned.  Below some information about fall prevention in the home, please review. Thank you for letting me participate in your health and well being.    Fall Prevention in the Home Falls can cause injuries. They can happen to people of all ages. There are many things you can do to make your home safe and to help prevent falls. What can I do on the outside of my home?  Regularly fix the edges of walkways and driveways and fix any cracks.  Remove anything that might make you trip as you walk through a door, such as a raised step or threshold.  Trim any bushes or trees on the path to your home.  Use bright outdoor lighting.  Clear any walking paths of anything that might make someone trip, such as rocks or tools.  Regularly check to see if handrails are loose or broken. Make sure that both sides of any steps have handrails.  Any raised decks and porches should have guardrails on the edges.  Have any leaves, snow, or ice cleared regularly.  Use sand or salt on walking paths during winter.  Clean up any spills in your garage right away. This includes oil or grease spills. What can I do in the bathroom?  Use night lights.  Install grab bars by the toilet and in the tub and shower. Do not use towel bars as grab bars.  Use non-skid mats or decals in the tub or shower.  If you need to sit down in the shower, use a plastic, non-slip stool.  Keep the floor dry. Clean up any water that spills on the floor as soon as it happens.  Remove soap buildup in the tub or shower regularly.  Attach bath mats securely with double-sided non-slip rug tape.  Do not have throw rugs and other things on the floor that can make  you trip. What can I do in the bedroom?  Use night lights.  Make sure that you have a light by your bed that is easy to reach.  Do not use any sheets or blankets that are too big for your bed. They should not hang down onto the floor.  Have a firm chair that has side arms. You can use this for support while you get dressed.  Do not have throw rugs and other things on the floor that can make you trip. What can I do in the kitchen?  Clean up any spills right away.  Avoid walking on wet floors.  Keep items that you use a lot in easy-to-reach places.  If you need to reach something above you, use a strong step stool that has a grab bar.  Keep electrical cords out of the way.  Do not use floor polish or wax that makes floors slippery. If you must use wax, use non-skid floor wax.  Do not have throw rugs and other things on the floor that can make you trip. What can I do with my stairs?  Do not leave any items on the stairs.  Make sure that there are handrails on both sides of the stairs and use them. Fix handrails that are broken or loose. Make sure  that handrails are as long as the stairways.  Check any carpeting to make sure that it is firmly attached to the stairs. Fix any carpet that is loose or worn.  Avoid having throw rugs at the top or bottom of the stairs. If you do have throw rugs, attach them to the floor with carpet tape.  Make sure that you have a light switch at the top of the stairs and the bottom of the stairs. If you do not have them, ask someone to add them for you. What else can I do to help prevent falls?  Wear shoes that: ? Do not have high heels. ? Have rubber bottoms. ? Are comfortable and fit you well. ? Are closed at the toe. Do not wear sandals.  If you use a stepladder: ? Make sure that it is fully opened. Do not climb a closed stepladder. ? Make sure that both sides of the stepladder are locked into place. ? Ask someone to hold it for you, if  possible.  Clearly mark and make sure that you can see: ? Any grab bars or handrails. ? First and last steps. ? Where the edge of each step is.  Use tools that help you move around (mobility aids) if they are needed. These include: ? Canes. ? Walkers. ? Scooters. ? Crutches.  Turn on the lights when you go into a dark area. Replace any light bulbs as soon as they burn out.  Set up your furniture so you have a clear path. Avoid moving your furniture around.  If any of your floors are uneven, fix them.  If there are any pets around you, be aware of where they are.  Review your medicines with your doctor. Some medicines can make you feel dizzy. This can increase your chance of falling. Ask your doctor what other things that you can do to help prevent falls. This information is not intended to replace advice given to you by your health care provider. Make sure you discuss any questions you have with your health care provider. Document Released: 04/17/2009 Document Revised: 11/27/2015 Document Reviewed: 07/26/2014 Elsevier Interactive Patient Education  2018 [employer].   IF you received an x-ray today, you will receive an invoice from [facility]. Please contact [facility] at [phone] with questions or concerns regarding your invoice.   IF you received labwork today, you will receive an invoice from [place]. Please contact [facility] at [phone] with questions or concerns regarding your invoice.   Our billing staff will not be able to assist you with questions regarding bills from these companies.  You will be contacted with the lab results as soon as they are available. The fastest way to get your results is to activate your My Chart account. Instructions are located on the last page of this paperwork. If you have not heard from [place] regarding the results in 2 weeks, please contact this office.

## 2017-05-10 LAB — CBC WITH DIFFERENTIAL/PLATELET
Basophils Absolute: 0 10*3/uL (ref 0.0–0.2)
Basos: 1 %
EOS (ABSOLUTE): 0.1 10*3/uL (ref 0.0–0.4)
Eos: 1 %
Hematocrit: 37 % (ref 34.0–46.6)
Hemoglobin: 12.9 g/dL (ref 11.1–15.9)
Immature Grans (Abs): 0 10*3/uL (ref 0.0–0.1)
Immature Granulocytes: 0 %
Lymphocytes Absolute: 1.3 10*3/uL (ref 0.7–3.1)
Lymphs: 31 %
MCH: 31.4 pg (ref 26.6–33.0)
MCHC: 34.9 g/dL (ref 31.5–35.7)
MCV: 90 fL (ref 79–97)
Monocytes Absolute: 0.3 10*3/uL (ref 0.1–0.9)
Monocytes: 7 %
Neutrophils Absolute: 2.5 10*3/uL (ref 1.4–7.0)
Neutrophils: 60 %
Platelets: 275 10*3/uL (ref 150–379)
RBC: 4.11 x10E6/uL (ref 3.77–5.28)
RDW: 14.5 % (ref 12.3–15.4)
WBC: 4.2 10*3/uL (ref 3.4–10.8)

## 2017-05-10 LAB — TSH: TSH: 1.26 u[IU]/mL (ref 0.450–4.500)

## 2017-05-10 LAB — CYCLIC CITRUL PEPTIDE ANTIBODY, IGG/IGA: CYCLIC CITRULLIN PEPTIDE AB: 7 U (ref 0–19)

## 2017-05-10 LAB — RHEUMATOID FACTOR

## 2017-05-10 LAB — ANA: ANA: NEGATIVE

## 2017-05-10 LAB — SEDIMENTATION RATE: Sed Rate: 17 mm/hr (ref 0–40)

## 2017-05-10 LAB — VITAMIN B12: VITAMIN B 12: 1758 pg/mL — AB (ref 232–1245)

## 2017-05-10 LAB — CK: Total CK: 101 U/L (ref 24–173)

## 2017-05-10 LAB — ANTISTREPTOLYSIN O TITER

## 2017-05-10 LAB — C-REACTIVE PROTEIN: CRP: 1.9 mg/L (ref 0.0–4.9)

## 2017-06-03 ENCOUNTER — Telehealth: Payer: Self-pay

## 2017-06-03 DIAGNOSIS — C50311 Malignant neoplasm of lower-inner quadrant of right female breast: Secondary | ICD-10-CM

## 2017-06-03 DIAGNOSIS — Z170 Estrogen receptor positive status [ER+]: Principal | ICD-10-CM

## 2017-06-03 NOTE — Telephone Encounter (Signed)
Labs entered.

## 2017-06-20 ENCOUNTER — Telehealth: Payer: Self-pay | Admitting: Physician Assistant

## 2017-06-20 NOTE — Telephone Encounter (Signed)
Copied from [place] [id number]. Topic: Quick Communication - Rx Refill/Question >> Jun 20, 2017 11:36 AM Malena Catholic I, NT wrote: Has the patient contacted their pharmacy yes    (Agent: If no, request that the patient contact the pharmacy for the refill Paxil 30 Mg   Preferred Pharmacy (with phone number or street name [facility] [place]: Please be advised that RX refills may take up to 3 business days. We ask that you follow-up with your pharmacy.

## 2017-06-21 NOTE — Telephone Encounter (Signed)
We did not prescribe this medication.

## 2017-06-24 ENCOUNTER — Telehealth: Payer: Self-pay | Admitting: Physician Assistant

## 2017-06-24 NOTE — Telephone Encounter (Signed)
Please advise 

## 2017-06-24 NOTE — Telephone Encounter (Signed)
She called in needing a refill on her Paxil.   She took her last one yesterday.  It was last prescribed by Dr. Verline Lema who has retired.  She has been seeing Dr. Timmothy Euler.    Would Dr. Timmothy Euler being willing to refill the Paxil.   The last time she ran out of Paxil she ended up at [facility] ED.    I routed a note to Dr. Eloisa Northern nurse pool.

## 2017-06-29 ENCOUNTER — Ambulatory Visit (INDEPENDENT_AMBULATORY_CARE_PROVIDER_SITE_OTHER): Payer: Medicare Other

## 2017-06-29 ENCOUNTER — Ambulatory Visit (INDEPENDENT_AMBULATORY_CARE_PROVIDER_SITE_OTHER): Payer: Medicare Other | Admitting: Physician Assistant

## 2017-06-29 ENCOUNTER — Encounter: Payer: Self-pay | Admitting: Physician Assistant

## 2017-06-29 ENCOUNTER — Other Ambulatory Visit: Payer: Self-pay

## 2017-06-29 VITALS — BP 142/74 | HR 60 | Temp 98.5°F | Ht 64.57 in | Wt 160.8 lb

## 2017-06-29 DIAGNOSIS — M79675 Pain in left toe(s): Secondary | ICD-10-CM

## 2017-06-29 DIAGNOSIS — F419 Anxiety disorder, unspecified: Secondary | ICD-10-CM | POA: Diagnosis not present

## 2017-06-29 MED ORDER — PAROXETINE HCL 20 MG PO TABS: 20.0000 mg | ORAL_TABLET | Freq: Every day | ORAL | 1 refills | Status: DC

## 2017-06-29 MED ORDER — PAROXETINE HCL 30 MG PO TABS: 30.0000 mg | ORAL_TABLET | Freq: Every day | ORAL | 0 refills | Status: DC

## 2017-06-29 NOTE — Progress Notes (Signed)
Tami [name]  MRN: [id number] DOB: August 16, 1950  Subjective:  Tami [name] is a 66 y.o. female seen in office today for a chief complaint of multiple complaints.  1) Toe injury: Patient notes she was walking around yesterday and tripped and bent her left third toe back.  She did not fall.  She had immediate pain.  Experienced bruising and swelling later in the day.  Has not tried anything for relief.  Denies numbness, tingling, and loss of range of motion.  No prior toe injuries.  2) Paxil Rx: She has been taking paxil for years for anxiety. Has been on 20 mg for a number of years. Feels as if anxiety is well ocontrolled on this dose. If she misses a day, her children can tell. Responsibilities at home are what causes her most anxiety. She has tried therapy in the past but did not benefit from it.  She eventually wants to come off Paxil but does not believe she is ready to do so at this time.  She has tried coming off of it in the past without tapering and ended up in [place] ED with withdrawal symptoms.  Denies nausea, vomiting, GI upset, heart palpitations, chest pain, and insomnia.  Has no other questions or complaints.  Review of Systems  Per HPI  Patient Active Problem List   Diagnosis Date Noted  . Depression 04/23/2016  . Nausea without vomiting 04/23/2016  . Trigger finger, acquired 03/26/2016  . Swelling of both ankles 12/18/2015  . First degree heart block 12/18/2015  . Abdominal pain   . Abnormal LFTs   . Oral candidiasis 05/14/2014  . Dehydration 05/14/2014  . Generalized pain 05/14/2014  . PAF (paroxysmal atrial fibrillation) ([place]) 05/14/2014  . PUD (peptic ulcer disease) 05/14/2014  . Transaminitis 05/14/2014  . Normocytic anemia 05/14/2014  . Epigastric abdominal pain 05/14/2014  . Family history of malignant neoplasm of breast   . Breast cancer of lower-inner quadrant of right female breast ([place]) 02/14/2014  . Hyperlipidemia 05/23/2013  . Caregiver stress  10/23/2012  . Osteoporosis, unspecified 08/24/2012  . DDD (degenerative disc disease), lumbar 08/23/2012  . Female stress incontinence 05/28/2012  . Hydronephrosis 05/28/2012  . Numbness and tingling of right arm and leg 08/23/2011  . Essential hypertension 09/14/2010  . Abdominal pain, epigastric 09/14/2010  . IRRITABLE BOWEL SYNDROME 12/29/2009  . OSTEOARTHRITIS 12/29/2009  . 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 PSVT 07/11/2008  . GERD 06/24/2000  . CANDIDIASIS, ESOPHAGEAL 04/19/1995  . ASTHMA UNSPECIFIED WITH EXACERBATION 04/19/1995    Current Outpatient Medications on File Prior to Visit  Medication Sig Dispense Refill  . albuterol (PROVENTIL HFA;VENTOLIN HFA) 108 (90 Base) MCG/ACT inhaler Inhale 2 puffs into the lungs every 4 (four) hours as needed (cough, shortness of breath or wheezing.). 1 Inhaler 3  . amitriptyline (ELAVIL) 25 MG tablet Take 25 mg by mouth at bedtime as needed for sleep. When patient has migraines    . aspirin EC 81 MG tablet Take 81 mg by mouth daily.    . Calcium Carbonate-Vitamin D (CALTRATE 600+D PO) Take by mouth.    . cetirizine (ZYRTEC) 10 MG tablet Take 10 mg by mouth daily.    . Cholecalciferol (VITAMIN D3) 5000 units CAPS Take 10,000 Units by mouth 2 (two) times daily.    . flecainide (TAMBOCOR) 100 MG tablet TAKE ONE-HALF TABLET BY MOUTH TWICE DAILY 90 tablet 2  . fluticasone (FLONASE) 50 MCG/ACT nasal spray Place 2 sprays into both nostrils daily. [place]  g 3  . ibuprofen (ADVIL,MOTRIN) 200 MG tablet Take 200 mg by mouth every 6 (six) hours as needed for headache or moderate pain.    [name] letrozole (FEMARA) 2.5 MG tablet Take 1 tablet (2.5 mg total) by mouth daily. 90 tablet 4  . Meth-Hyo-M Bl-Na Phos-Ph Sal (URIBEL) 118 MG CAPS Take 118 mg by mouth 4 (four) times daily as needed (for bladder). Reported on 09/09/2015  2  . metoprolol succinate (TOPROL-XL) 50 MG 24 hr tablet Take 1 tablet (50 mg total) by mouth daily. Need appointment before future refills  60 tablet 0  . ondansetron (ZOFRAN) 8 MG tablet Take 1 tablet (8 mg total) by mouth every 8 (eight) hours as needed for nausea or vomiting. 20 tablet 0  . vitamin B-12 (CYANOCOBALAMIN) 1000 MCG tablet Take 1,000 mcg by mouth daily.     Current Facility-Administered Medications on File Prior to Visit  Medication Dose Route Frequency Provider Last Rate Last Dose  . 0.9 %  sodium chloride infusion  500 mL Intravenous Continuous Irene Shipper, MD        Allergies  Allergen Reactions  . Latex Swelling and Rash    Skin swelling   . Sulfonamide Derivatives Anaphylaxis, Shortness Of Breath, Nausea Only and Other (See Comments)    Sweating, headache   . Clarithromycin Nausea Only and Other (See Comments)    Headache   . Crestor [Rosuvastatin] Other (See Comments)    Muscle aches, pain, weakness in lower extremities  . Livalo [Pitavastatin] Other (See Comments)    Leg pain  . Statins Other (See Comments)    Muscle soreness and weakness w/ Rosuvastatin and Zetia.  . Ciprofloxacin Hcl Nausea Only, Palpitations and Other (See Comments)    Headache       Social History   Socioeconomic History  . Marital status: Married    Spouse name: Tami [name]   . Number of children: 2  . Years of education: Not on file  . Highest education level: Not on file  Social Needs  . Financial resource strain: Not on file  . Food insecurity - worry: Not on file  . Food insecurity - inability: Not on file  . Transportation needs - medical: Not on file  . Transportation needs - non-medical: Not on file  Occupational History  . Not on file  Tobacco Use  . Smoking status: Never Smoker  . Smokeless tobacco: Never Used  Substance and Sexual Activity  . Alcohol use: No  . Drug use: No  . Sexual activity: Yes    Partners: Male    Comment: number of sex partners in the last 12 months 1  Other Topics Concern  . Not on file  Social History Narrative   Exercise: walks on treadmill three times a week for 30-45  minutes at a time    Objective:  BP (!) 142/74 (BP Location: Left Arm, Patient Position: Sitting, Cuff Size: Normal)   Pulse 60   Temp 98.5 F (36.9 C) (Oral)   Ht 5' 4.57" (1.64 m)   Wt 160 lb 12.8 oz (72.9 kg)   LMP 07/06/1985 (Approximate)   SpO2 98%   BMI 27.12 kg/m   Physical Exam  Constitutional: She is oriented to person, place, and time and well-developed, well-nourished, and in no distress.  HENT:  Head: Normocephalic and atraumatic.  Eyes: Conjunctivae are normal.  Neck: Normal range of motion.  Pulmonary/Chest: Effort normal.  Musculoskeletal:       Left ankle: Normal.  She exhibits no swelling and no ecchymosis. No tenderness. Achilles tendon normal.       Left foot: There is normal capillary refill.       Feet:  Neurological: She is alert and oriented to person, place, and time. Gait normal.  Skin: Skin is warm and dry.  Psychiatric: Affect normal.  Vitals reviewed.  Dg Toe 3rd Left  Result Date: 06/29/2017 CLINICAL DATA:  Tripped yesterday, bruising and swelling of LEFT third toe EXAM: LEFT THIRD TOE COMPARISON:  None FINDINGS: Osseous demineralization. 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 Joint spaces otherwise preserved. No acute fracture, dislocation, or bone destruction. IMPRESSION: No acute osseous abnormalities. Electronically Signed   By: Lavonia Dana M.D.   On: 06/29/2017 17:35   GAD 7 : Generalized Anxiety Score 06/29/2017  Nervous, Anxious, on Edge 1  Control/stop worrying 1  Worry too much - different things 1  Trouble relaxing 2  Restless 0  Easily annoyed or irritable 1  Afraid - awful might happen 0  Total GAD 7 Score 6  Anxiety Difficulty Not difficult at all    Depression screen [facility] 2/9 06/29/2017 06/29/2017 05/09/2017 04/04/2017 12/15/2016  Decreased Interest 3 0 0 0 0  Down, Depressed, Hopeless 1 0 0 0 0  PHQ - 2 Score 4 0 0 0 0  Altered sleeping 3 - - - -  Tired, decreased energy 3 - - -  -  Change in appetite 2 - - - -  Feeling bad or failure about yourself  2 - - - -  Trouble concentrating 3 - - - -  Moving slowly or fidgety/restless 0 - - - -  Suicidal thoughts 0 - - - -  PHQ-9 Score 17 - - - -  Some recent data might be hidden    Assessment and Plan :  1. Pain of toe of left foot Plain films with no acute bony abnormalities.  Will treat as sprain at this time.  Toe was buddy taped in office.  Recommended to continue buddy taping until swelling and pain have resolved.  Recommended elevating, ice, and over-the-counter NSAIDs for pain as needed.  Return if symptoms persist after 1 week or sooner if they worsen. - DG Toe 3rd Left; Future  2. Anxiety Per history and screening tools, it appears that patient suffers from both anxiety and depression.  We will continue with current treatment at this time.  Patient is also on flecainide, which Paxil does interfere with.  She is on low-dose flecanide and Paxil and has been on both of these for a number of years and never had an issue. Educated on potential side effects of using both of these at the same time.  She takes flecainide for paroxysmal SVT and atrial fibrillation.  She is followed closely by cardiologist, Dr. Claiborne Billings. I have discussed this interaction with supervising physician, Dr. Tamala Julian and she agrees that it is likely safe for patient to continue with both of these medications at this time. Discussed with patient that there are other medications that treat anxiety and depression that interfere to a lesser degree with flecanide compared to paxil, such as celexa. Pt is interested in potentially trying these in the future or just coming off SSRIs altogether. Discussed with patient that if she does desire to come off Paxil altogether, she will need to notify our office so we can discuss a proper tapering regimen for her to do so.  Patient understands  and agrees to treatment plan. - PARoxetine (PAXIL) 20 MG tablet; Take 1 tablet (20  mg total) by mouth daily.  Dispense: 90 tablet; Refill: [place] PA-C  Primary [facility] at [facility] 06/29/2017 5:41 PM

## 2017-06-29 NOTE — Addendum Note (Signed)
Addended by: Tenna Delaine D on: 06/29/2017 08:40 AM   Modules accepted: Orders

## 2017-06-29 NOTE — Telephone Encounter (Signed)
Will you please call patient and tell her she needs to schedule an appointment to discuss her medication refill.  I have sent in 30 days of Paxil to her pharmacy but she will need to make an appointment with me before she runs out for further medication refills. Thank you!

## 2017-06-29 NOTE — Patient Instructions (Addendum)
Your xrays did not show any acute fracture or dislocation. It is likely sprained. I recommend keeping it buddy taped to a neighboring toe until the pain and swelling resolved. I recommend elevating it while you are at home and applying ice to the affected area 4-5 x a day for 20 minutes at a time. Take over the counter pain medication as prescribed. Please return if symptoms persist after 1 week or sooner if they worsen.   For anxiety and depression, continue your current dose of paxil. When you are ready to taper off this medication, let me know. Thank you for letting me participate in your health and well being.    RICE for Routine Care of Injuries Many injuries can be cared for using rest, ice, compression, and elevation (RICE therapy). Using RICE therapy can help to lessen pain and swelling. It can help your body to heal. Rest Reduce your normal activities and avoid using the injured part of your body. You can go back to your normal activities when you feel okay and your doctor says it is okay. Ice Do not put ice on your bare skin.  Put ice in a plastic bag.  Place a towel between your skin and the bag.  Leave the ice on for 20 minutes, 2-3 times a day.  Do this for as long as told by your doctor. Compression Compression means putting pressure on the injured area. This can be done with an elastic bandage. If an elastic bandage has been applied:  Remove and reapply the bandage every 3-4 hours or as told by your doctor.  Make sure the bandage is not wrapped too tight. Wrap the bandage more loosely if part of your body beyond the bandage is blue, swollen, cold, painful, or loses feeling (numb).  See your doctor if the bandage seems to make your problems worse.  Elevation Elevation means keeping the injured area raised. Raise the injured area above your heart or the center of your chest if you can. When should I get help? You should get help if:  You keep having pain and  swelling.  Your symptoms get worse.  Get help right away if: You should get help right away if:  You have sudden bad pain at or below the area of your injury.  You have redness or more swelling around your injury.  You have tingling or numbness at or below the injury that does not go away when you take off the bandage.  This information is not intended to replace advice given to you by your health care provider. Make sure you discuss any questions you have with your health care provider. Document Released: 12/08/2007 Document Revised: 05/18/2016 Document Reviewed: 05/29/2014 Elsevier Interactive Patient Education  2017 [employer].  IF you received an x-ray today, you will receive an invoice from [facility]. Please contact [facility] at [phone] with questions or concerns regarding your invoice.   IF you received labwork today, you will receive an invoice from [place]. Please contact [facility] at [phone] with questions or concerns regarding your invoice.   Our billing staff will not be able to assist you with questions regarding bills from these companies.  You will be contacted with the lab results as soon as they are available. The fastest way to get your results is to activate your My Chart account. Instructions are located on the last page of this paperwork. If you have not heard from [place] regarding the results in 2 weeks, please contact  this office.

## 2017-06-30 ENCOUNTER — Ambulatory Visit: Payer: Medicare Other | Admitting: Physician Assistant

## 2017-06-30 NOTE — Telephone Encounter (Signed)
Informed pt that  Rx was ready for her to pick up at her pharmacy. Also informed her that she needs to schedule an appointment for further refills on medication.

## 2017-07-01 ENCOUNTER — Telehealth: Payer: Self-pay

## 2017-07-01 NOTE — Telephone Encounter (Signed)
Filled out PA for Paroxetine tablets.  Key code is [id number].  Should have answer within 72 business hrs.

## 2017-07-03 ENCOUNTER — Encounter: Payer: Self-pay | Admitting: Physician Assistant

## 2017-07-04 NOTE — Telephone Encounter (Signed)
Patient's medication was approved through 07/04/2018. I called to notify patient and she said this is the wrong patient.  I verified birthdate as given by pharmacy and it matches what was provided by pharmacy.   Call to pharmacy to discuss patient.

## 2017-07-04 NOTE — Telephone Encounter (Signed)
Forwarding these messages to the provider so that she is aware.  Chart shows she saw [place] in December.  Patient may be confused.  Pharmacy verified this is correct patient.

## 2017-07-26 ENCOUNTER — Encounter: Payer: Self-pay | Admitting: Neurology

## 2017-07-26 ENCOUNTER — Ambulatory Visit (INDEPENDENT_AMBULATORY_CARE_PROVIDER_SITE_OTHER): Payer: Medicare Other | Admitting: Neurology

## 2017-07-26 VITALS — BP 143/80 | HR 76 | Ht 64.5 in | Wt 158.8 lb

## 2017-07-26 DIAGNOSIS — G3281 Cerebellar ataxia in diseases classified elsewhere: Secondary | ICD-10-CM

## 2017-07-26 DIAGNOSIS — M25551 Pain in right hip: Secondary | ICD-10-CM

## 2017-07-26 DIAGNOSIS — R269 Unspecified abnormalities of gait and mobility: Secondary | ICD-10-CM | POA: Insufficient documentation

## 2017-07-26 DIAGNOSIS — R531 Weakness: Secondary | ICD-10-CM | POA: Diagnosis not present

## 2017-07-26 NOTE — Progress Notes (Signed)
PATIENT: Tami [name] DOB: 1951/06/29  Chief Complaint  Patient presents with  . Extremity Weakness    She is concerned about weakness in her thigh, hips and legs (right side worse than left).  Symptoms cause her to feel off balance and she reports some falls.  Occasionally, she will also have severe pain in her pelvic floor, groin, leg and hip pain.  [name] PCP    Tenna Delaine D, PA-C     HISTORICAL  Tami [name] is a 67 year old female, seen in refer by her primary care PA Tenna Delaine D, for evaluation of bilateral hip thigh weakness, loss of balance, initial evaluation was on July 26, 2017.  I reviewed and summarized the referring note, she had a history of migraine, anxiety, breast cancer, status post  lumpectomy in 2015, fibromyalgia, hyperlipidemia, supraventricular tachycardia.  She is a retired Radio producer, used to be very active, does has history of chronic low back pain, tends to radiating to her right leg, exacerbated by prolonged standing, walking, her low back pain getting worse since 2017, she noticed in the morning radiating pain to right lateral thigh and leg, numbness tingling, she also describes difficulty sleeping, because of her right leg symptoms, she has to toss and turn, change position frequently.  She also complains of increased bilateral groin area pain, right worse than left, especially after physical activity, she has to take frequent NSAIDs for symptoms control  Sometimes she felt that her legs give out underneath her,  she also complains of frequent nocturia, woke up at least 4 times at nighttime to go to the bathroom, she has urinary urgency, irritable bowel syndromes,  MRI of cervical spine November 2016 showed no significant abnormality.  MRI of lumbar spine in July 2014, showed no significant canal foraminal narrowing  X-ray of right hip showed mild narrowing of the joints, no significant abnormality.  Laboratory evaluation in  November 2018 showed normal CMP, CBC, TSH, C-reactive protein, ESR, rheumatoid factor, ANA, CPK, B12,  REVIEW OF SYSTEMS: Full 14 system review of systems performed and notable only for memory loss, insomnia, sleepiness, headache, numbness, weakness, tremor, anxiety, not enough sleep, decreased energy, easy bruising, feeling hot, flushing, joint pain, achy muscles, allergy, incontinence, palpitation, fatigue  ALLERGIES: Allergies  Allergen Reactions  . Latex Swelling and Rash    Skin swelling   . Sulfonamide Derivatives Anaphylaxis, Shortness Of Breath, Nausea Only and Other (See Comments)    Sweating, headache   . Clarithromycin Nausea Only and Other (See Comments)    Headache   . Crestor [Rosuvastatin] Other (See Comments)    Muscle aches, pain, weakness in lower extremities  . Livalo [Pitavastatin] Other (See Comments)    Leg pain  . Statins Other (See Comments)    Muscle soreness and weakness w/ Rosuvastatin and Zetia.  . Ciprofloxacin Hcl Nausea Only, Palpitations and Other (See Comments)    Headache     HOME MEDICATIONS: Current Outpatient Medications  Medication Sig Dispense Refill  . albuterol (PROVENTIL HFA;VENTOLIN HFA) 108 (90 Base) MCG/ACT inhaler Inhale 2 puffs into the lungs every 4 (four) hours as needed (cough, shortness of breath or wheezing.). 1 Inhaler 3  . amitriptyline (ELAVIL) 25 MG tablet Take 25 mg by mouth at bedtime as needed for sleep. When patient has migraines    . aspirin EC 81 MG tablet Take 81 mg by mouth daily.    . Calcium Carbonate-Vitamin D (CALTRATE 600+D PO) Take by mouth.    . cetirizine (ZYRTEC) 10  MG tablet Take 10 mg by mouth daily.    . Cholecalciferol (VITAMIN D3) 5000 units CAPS Take 10,000 Units by mouth 2 (two) times daily.    . flecainide (TAMBOCOR) 100 MG tablet TAKE ONE-HALF TABLET BY MOUTH TWICE DAILY 90 tablet 2  . fluticasone (FLONASE) 50 MCG/ACT nasal spray Place 2 sprays into both nostrils daily. 16 g 3  . ibuprofen  (ADVIL,MOTRIN) 200 MG tablet Take 200 mg by mouth every 6 (six) hours as needed for headache or moderate pain.    [name] letrozole (FEMARA) 2.5 MG tablet Take 1 tablet (2.5 mg total) by mouth daily. 90 tablet 4  . Meth-Hyo-M Bl-Na Phos-Ph Sal (URIBEL) 118 MG CAPS Take 118 mg by mouth 4 (four) times daily as needed (for bladder). Reported on 09/09/2015  2  . metoprolol succinate (TOPROL-XL) 50 MG 24 hr tablet Take 1 tablet (50 mg total) by mouth daily. Need appointment before future refills 60 tablet 0  . PARoxetine (PAXIL) 20 MG tablet Take 1 tablet (20 mg total) by mouth daily. 90 tablet 1  . vitamin B-12 (CYANOCOBALAMIN) 1000 MCG tablet Take 1,000 mcg by mouth daily.     Current Facility-Administered Medications  Medication Dose Route Frequency Provider Last Rate Last Dose  . 0.9 %  sodium chloride infusion  500 mL Intravenous Continuous Irene Shipper, MD        PAST MEDICAL HISTORY: Past Medical History:  Diagnosis Date  . Anemia    as a child  . Anxiety   . Arthritis   . Asthma   . Atrial fibrillation ([place])   . Breast cancer ([place]) 2015  . Depression with anxiety   . Dysrhythmia    hx AF  . Family history of malignant neoplasm of breast   . Fibromyalgia   . Heart palpitations   . Heartburn   . Hyperlipidemia   . 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 Irregular heartbeat   . Malignant neoplasm of breast (female), unspecified site   . Migraine   . Numbness of arm    Right Arm  . Osteoporosis   . Personal history of chemotherapy 2015   Right Breast Cancer  . Personal history of radiation therapy 2015   Right Breast Cancer  . Pure hypercholesterolemia   . SVT (supraventricular tachycardia) ([place])     PAST SURGICAL HISTORY: Past Surgical History:  Procedure Laterality Date  . 30 Day Monitor  04/10/2009 to 05/23/2009   No End Of Summary Report Atrial Fib,Narrow Complex Tachycardia,Sinus Rhythm   . ABDOMINAL HYSTERECTOMY  1987  . BREAST  LUMPECTOMY Right 2015  . BREAST LUMPECTOMY WITH NEEDLE LOCALIZATION AND AXILLARY SENTINEL LYMPH NODE BX Right 04/02/2014   Procedure: Right Axillary Lymphatic Mapping; Right axillary Sentinal Node Biopsy; Right Partial Mastectomy after wire localization;  Surgeon: Jackolyn Confer, MD;  Location: [facility];  Service: General;  Laterality: Right;  . BREAST SURGERY     Left  . CYSTOSCOPY  04/25/2012   Pt may be a candidate for research anti-incontinence procedure protocol  . Dipyridamole Myoview  05/27/2009   The post stress myocardial perfusion images show a normal pattern of perfusion in all regions.The post stress ejection fraction is 83 %. Normal  myocardial perfusion imaging This is a low risk scan.  . DOPPLER ECHOCARDIOGRAPHY  08/21/2010   Ejecion Fraction =>55% All chambers are normal in size and function No significant change from 2010.  [name] lower Venous Dopplers  04/11/2009  Normal bilateral lower ext venous duplex  . PORTACATH PLACEMENT N/A 04/26/2014   Procedure: ULTRASOUND GUIDED PORT A CATHE INSERTION;  Surgeon: Jackolyn Confer, MD;  Location: [facility];  Service: General;  Laterality: N/A;  . Upper Arterial Dopplers  06/02/2011   Right Side PPG Demonstrates siginificantly diminshed waveforms with the Adson's maneuver which may suggest Thoracic Outlet Syndrone. This is a abnormal thoracic outlet arterial evaluation.    FAMILY HISTORY: Family History  Problem Relation Age of Onset  . Cancer Mother        skin cancer  . Diabetes Sister        Borderline  . Hypertension Sister   . Heart attack Sister   . Breast cancer Sister 25       maternal half-sister; deceased 41  . Diabetes Brother        Borderline; maternal half-brother  . Cancer Maternal Aunt        stomach and throat; heavy smoker and drinker; deceased 42  . Stomach cancer Maternal Aunt   . Cancer Other        breast/ovarian; daughter of mat cousin with unk. primary at 94; pt's 1st cousin-once-removed  . Other Father         accident  . Cancer Cousin 72       unk. primary; daughter of mat aunt with stomach/throat ca  . Colon cancer Neg Hx   . Esophageal cancer Neg Hx   . Rectal cancer Neg Hx     SOCIAL HISTORY:  Social History   Socioeconomic History  . Marital status: Married    Spouse name: Gwyndolyn Saxon   . Number of children: 2  . Years of education: 16  . Highest education level: Bachelor's degree (e.g., BA, AB, BS)  Social Needs  . Financial resource strain: Not on file  . Food insecurity - worry: Not on file  . Food insecurity - inability: Not on file  . Transportation needs - medical: Not on file  . Transportation needs - non-medical: Not on file  Occupational History  . Occupation: Retired  Tobacco Use  . Smoking status: Never Smoker  . Smokeless tobacco: Never Used  Substance and Sexual Activity  . Alcohol use: No  . Drug use: No  . Sexual activity: Yes    Partners: Male    Comment: number of sex partners in the last 12 months 1  Other Topics Concern  . Not on file  Social History Narrative   Exercise: walks on treadmill three times a week for 30-45 minutes at a time      Lives at home with husband.   2 cups caffeine daily.   Right-handed.        PHYSICAL EXAM   Vitals:   07/26/17 1021  BP: (!) 143/80  Pulse: 76  Weight: 158 lb 12 oz (72 kg)  Height: 5' 4.5" (1.638 m)    Not recorded      Body mass index is 26.83 kg/m.  PHYSICAL EXAMNIATION:  Gen: NAD, conversant, well nourised, obese, well groomed                     Cardiovascular: Regular rate rhythm, no peripheral edema, warm, nontender. Eyes: Conjunctivae clear without exudates or hemorrhage Neck: Supple, no carotid bruits. Pulmonary: Clear to auscultation bilaterally   NEUROLOGICAL EXAM:  MENTAL STATUS: Speech:    Speech is normal; fluent and spontaneous with normal comprehension.  Cognition:     Orientation to time, place and  person     Normal recent and remote memory     Normal Attention span and  concentration     Normal Language, naming, repeating,spontaneous speech     Fund of knowledge   CRANIAL NERVES: CN II: Visual fields are full to confrontation. Fundoscopic exam is normal with sharp discs and no vascular changes. Pupils are round equal and briskly reactive to light. CN III, IV, VI: extraocular movement are normal. No ptosis. CN V: Facial sensation is intact to pinprick in all 3 divisions bilaterally. Corneal responses are intact.  CN VII: Face is symmetric with normal eye closure and smile. CN VIII: Hearing is normal to rubbing fingers CN IX, X: Palate elevates symmetrically. Phonation is normal. CN XI: Head turning and shoulder shrug are intact CN XII: Tongue is midline with normal movements and no atrophy.  MOTOR: There is no pronator drift of out-stretched arms. Muscle bulk and tone are normal. Muscle strength is normal.  REFLEXES: Reflexes are 3 and symmetric at the biceps, triceps, knees, and ankles. Plantar responses are flexor.  SENSORY: Intact to light touch, pinprick, positional sensation and vibratory sensation are intact in fingers and toes.  COORDINATION: Rapid alternating movements and fine finger movements are intact. There is no dysmetria on finger-to-nose and heel-knee-shin.    GAIT/STANCE: Posture is normal. Gait is steady with normal steps, base, arm swing, and turning. Heel and toe walking are normal. Tandem gait is normal.  Romberg is absent.   DIAGNOSTIC DATA (LABS, IMAGING, TESTING) - I reviewed patient records, labs, notes, testing and imaging myself where available.   ASSESSMENT AND PLAN  Tami [name] is a 67 y.o. female   Gait abnormality, urinary urgency  Hyperreflexia on examinations  Need to rule out central nervous system etiology,  Proceed with MRI of the brain, MRI of thoracic spine  Right leg radiating pain, bilateral hip pain,  Differentiation diagnoses include bilateral lumbar sacral radiculopathy, versus bilateral hip  pathology  X-ray of bilateral hip    Marcial Pacas, M.D. Ph.D.  [facility] [address], [place], [place] [address] Ph: [phone] Fax: [phone]  CC: Tenna Delaine D, PA-C

## 2017-07-27 ENCOUNTER — Telehealth: Payer: Self-pay | Admitting: Neurology

## 2017-07-27 ENCOUNTER — Other Ambulatory Visit: Payer: Self-pay | Admitting: *Deleted

## 2017-07-27 DIAGNOSIS — M25551 Pain in right hip: Secondary | ICD-10-CM

## 2017-07-27 DIAGNOSIS — M25552 Pain in left hip: Secondary | ICD-10-CM

## 2017-07-27 DIAGNOSIS — R102 Pelvic and perineal pain: Secondary | ICD-10-CM

## 2017-07-27 NOTE — Telephone Encounter (Signed)
Dr. Krista Blue has reviewed the information below.  Patient's x-ray order has been changed in Epic.  Returned call to patient and she was appreciative.

## 2017-07-27 NOTE — Telephone Encounter (Signed)
Pt called she said yesterday when she left the office she went to a bank and when she was in line she experienced a pain in the left groin just like she had experienced in the right. She was able to hold on to a post for support but on the way out the door the leg gave away. She was able to catch onto the wall and door which kept her from falling. Pt said she told Dr Krista Blue yesterday it was only the rt leg but it is the left also. She did not have xray yesterday in hopes Dr Krista Blue would change the order to read for bil hips. Please call to advise

## 2017-07-29 ENCOUNTER — Ambulatory Visit
Admission: RE | Admit: 2017-07-29 | Discharge: 2017-07-29 | Disposition: A | Discharge status: Home / Self Care | Payer: Medicare Other | Source: Ambulatory Visit | Attending: Neurology | Admitting: Neurology

## 2017-07-29 DIAGNOSIS — M25552 Pain in left hip: Principal | ICD-10-CM

## 2017-07-29 DIAGNOSIS — R102 Pelvic and perineal pain: Secondary | ICD-10-CM

## 2017-07-29 DIAGNOSIS — M25551 Pain in right hip: Secondary | ICD-10-CM

## 2017-07-31 ENCOUNTER — Other Ambulatory Visit: Payer: Self-pay | Admitting: Cardiovascular Disease

## 2017-08-09 ENCOUNTER — Ambulatory Visit
Admission: RE | Admit: 2017-08-09 | Discharge: 2017-08-09 | Disposition: A | Discharge status: Home / Self Care | Payer: Medicare Other | Source: Ambulatory Visit | Attending: Neurology | Admitting: Neurology

## 2017-08-09 DIAGNOSIS — R269 Unspecified abnormalities of gait and mobility: Secondary | ICD-10-CM

## 2017-08-09 DIAGNOSIS — R531 Weakness: Secondary | ICD-10-CM

## 2017-08-09 DIAGNOSIS — G3281 Cerebellar ataxia in diseases classified elsewhere: Secondary | ICD-10-CM

## 2017-08-09 DIAGNOSIS — M25551 Pain in right hip: Secondary | ICD-10-CM

## 2017-08-19 ENCOUNTER — Ambulatory Visit (INDEPENDENT_AMBULATORY_CARE_PROVIDER_SITE_OTHER): Payer: Medicare Other | Admitting: Neurology

## 2017-08-19 DIAGNOSIS — M25551 Pain in right hip: Secondary | ICD-10-CM | POA: Diagnosis not present

## 2017-08-19 DIAGNOSIS — M25559 Pain in unspecified hip: Secondary | ICD-10-CM | POA: Diagnosis not present

## 2017-08-19 DIAGNOSIS — G3281 Cerebellar ataxia in diseases classified elsewhere: Secondary | ICD-10-CM

## 2017-08-19 DIAGNOSIS — R531 Weakness: Secondary | ICD-10-CM

## 2017-08-19 DIAGNOSIS — R102 Pelvic and perineal pain: Secondary | ICD-10-CM

## 2017-08-19 NOTE — Progress Notes (Signed)
PATIENT: Tami [name] DOB: 09/21/1950  No chief complaint on file.    HISTORICAL  Tami [name] is a 67 year old female, seen in refer by her primary care PA Tenna Delaine D, for evaluation of bilateral hip thigh weakness, loss of balance, initial evaluation was on July 26, 2017.  I reviewed and summarized the referring note, she had a history of migraine, anxiety, breast cancer, status post  lumpectomy in 2015, fibromyalgia, hyperlipidemia, supraventricular tachycardia.  She is a retired Radio producer, used to be very active, does has history of chronic low back pain, tends to radiating to her right leg, exacerbated by prolonged standing, walking, her low back pain getting worse since 2017, she noticed in the morning radiating pain to right lateral thigh and leg, numbness tingling, she also describes difficulty sleeping, because of her right leg symptoms, she has to toss and turn, change position frequently.  She also complains of increased bilateral groin area pain, right worse than left, especially after physical activity, she has to take frequent NSAIDs for symptoms control  Sometimes she felt that her legs give out underneath her,  she also complains of frequent nocturia, woke up at least 4 times at nighttime to go to the bathroom, she has urinary urgency, irritable bowel syndromes,  MRI of cervical spine November 2016 showed no significant abnormality.  MRI of lumbar spine in July 2014, showed no significant canal foraminal narrowing  X-ray of right hip showed mild narrowing of the joints, no significant abnormality.  Laboratory evaluation in November 2018 showed normal CMP, CBC, TSH, C-reactive protein, ESR, rheumatoid factor, ANA, CPK, B12,  UPDATE Aug 19 2017: I personally reviewed MRI of the brain in February 2019, mild supratentorium small vessel disease, and there was no acute abnormality. MRI of thoracic spine was normal.  X-ray of hip showed marked  sclerotic changes at the pubic symphysis, no significant change or focal lesions at bilateral hip,  EMG/NCS is normal.  REVIEW OF SYSTEMS: Full 14 system review of systems performed and notable only for memory loss, insomnia, sleepiness, headache, numbness, weakness, tremor, anxiety, not enough sleep, decreased energy, easy bruising, feeling hot, flushing, joint pain, achy muscles, allergy, incontinence, palpitation, fatigue  ALLERGIES: Allergies  Allergen Reactions  . Latex Swelling and Rash    Skin swelling   . Sulfonamide Derivatives Anaphylaxis, Shortness Of Breath, Nausea Only and Other (See Comments)    Sweating, headache   . Clarithromycin Nausea Only and Other (See Comments)    Headache   . Crestor [Rosuvastatin] Other (See Comments)    Muscle aches, pain, weakness in lower extremities  . Livalo [Pitavastatin] Other (See Comments)    Leg pain  . Statins Other (See Comments)    Muscle soreness and weakness w/ Rosuvastatin and Zetia.  . Ciprofloxacin Hcl Nausea Only, Palpitations and Other (See Comments)    Headache     HOME MEDICATIONS: Current Outpatient Medications  Medication Sig Dispense Refill  . albuterol (PROVENTIL HFA;VENTOLIN HFA) 108 (90 Base) MCG/ACT inhaler Inhale 2 puffs into the lungs every 4 (four) hours as needed (cough, shortness of breath or wheezing.). 1 Inhaler 3  . amitriptyline (ELAVIL) 25 MG tablet Take 25 mg by mouth at bedtime as needed for sleep. When patient has migraines    . aspirin EC 81 MG tablet Take 81 mg by mouth daily.    . Calcium Carbonate-Vitamin D (CALTRATE 600+D PO) Take by mouth.    . cetirizine (ZYRTEC) 10 MG tablet Take 10 mg by mouth daily.    [name]  Cholecalciferol (VITAMIN D3) 5000 units CAPS Take 10,000 Units by mouth 2 (two) times daily.    . flecainide (TAMBOCOR) 100 MG tablet TAKE ONE-HALF TABLET BY MOUTH TWICE DAILY 90 tablet 2  . fluticasone (FLONASE) 50 MCG/ACT nasal spray Place 2 sprays into both nostrils daily. 16 g 3  .  ibuprofen (ADVIL,MOTRIN) 200 MG tablet Take 200 mg by mouth every 6 (six) hours as needed for headache or moderate pain.    [name] letrozole (FEMARA) 2.5 MG tablet Take 1 tablet (2.5 mg total) by mouth daily. 90 tablet 4  . Meth-Hyo-M Bl-Na Phos-Ph Sal (URIBEL) 118 MG CAPS Take 118 mg by mouth 4 (four) times daily as needed (for bladder). Reported on 09/09/2015  2  . metoprolol succinate (TOPROL-XL) 50 MG 24 hr tablet TAKE 1 TABLET BY MOUTH DAILY. TAKE WITH OR IMMEDIATELY FOLLOWING A MEAL. 90 tablet 2  . PARoxetine (PAXIL) 20 MG tablet Take 1 tablet (20 mg total) by mouth daily. 90 tablet 1  . vitamin B-12 (CYANOCOBALAMIN) 1000 MCG tablet Take 1,000 mcg by mouth daily.     Current Facility-Administered Medications  Medication Dose Route Frequency Provider Last Rate Last Dose  . 0.9 %  sodium chloride infusion  500 mL Intravenous Continuous Irene Shipper, MD        PAST MEDICAL HISTORY: Past Medical History:  Diagnosis Date  . Anemia    as a child  . Anxiety   . Arthritis   . Asthma   . Atrial fibrillation ([place])   . Breast cancer ([place]) 2015  . Depression with anxiety   . Dysrhythmia    hx AF  . Family history of malignant neoplasm of breast   . Fibromyalgia   . Heart palpitations   . Heartburn   . Hyperlipidemia   . 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 Irregular heartbeat   . Malignant neoplasm of breast (female), unspecified site   . Migraine   . Numbness of arm    Right Arm  . Osteoporosis   . Personal history of chemotherapy 2015   Right Breast Cancer  . Personal history of radiation therapy 2015   Right Breast Cancer  . Pure hypercholesterolemia   . SVT (supraventricular tachycardia) ([place])     PAST SURGICAL HISTORY: Past Surgical History:  Procedure Laterality Date  . 30 Day Monitor  04/10/2009 to 05/23/2009   No End Of Summary Report Atrial Fib,Narrow Complex Tachycardia,Sinus Rhythm   . ABDOMINAL HYSTERECTOMY  1987  . BREAST  LUMPECTOMY Right 2015  . BREAST LUMPECTOMY WITH NEEDLE LOCALIZATION AND AXILLARY SENTINEL LYMPH NODE BX Right 04/02/2014   Procedure: Right Axillary Lymphatic Mapping; Right axillary Sentinal Node Biopsy; Right Partial Mastectomy after wire localization;  Surgeon: Jackolyn Confer, MD;  Location: [facility];  Service: General;  Laterality: Right;  . BREAST SURGERY     Left  . CYSTOSCOPY  04/25/2012   Pt may be a candidate for research anti-incontinence procedure protocol  . Dipyridamole Myoview  05/27/2009   The post stress myocardial perfusion images show a normal pattern of perfusion in all regions.The post stress ejection fraction is 83 %. Normal  myocardial perfusion imaging This is a low risk scan.  . DOPPLER ECHOCARDIOGRAPHY  08/21/2010   Ejecion Fraction =>55% All chambers are normal in size and function No significant change from 2010.  [name] lower Venous Dopplers  04/11/2009   Normal bilateral lower ext venous duplex  . PORTACATH PLACEMENT N/A  04/26/2014   Procedure: ULTRASOUND GUIDED PORT A CATHE INSERTION;  Surgeon: Jackolyn Confer, MD;  Location: [facility];  Service: General;  Laterality: N/A;  . Upper Arterial Dopplers  06/02/2011   Right Side PPG Demonstrates siginificantly diminshed waveforms with the Adson's maneuver which may suggest Thoracic Outlet Syndrone. This is a abnormal thoracic outlet arterial evaluation.    FAMILY HISTORY: Family History  Problem Relation Age of Onset  . Cancer Mother        skin cancer  . Diabetes Sister        Borderline  . Hypertension Sister   . Heart attack Sister   . Breast cancer Sister 58       maternal half-sister; deceased 45  . Diabetes Brother        Borderline; maternal half-brother  . Cancer Maternal Aunt        stomach and throat; heavy smoker and drinker; deceased 74  . Stomach cancer Maternal Aunt   . Cancer Other        breast/ovarian; daughter of mat cousin with unk. primary at 59; pt's 1st cousin-once-removed  . Other Father         accident  . Cancer Cousin 72       unk. primary; daughter of mat aunt with stomach/throat ca  . Colon cancer Neg Hx   . Esophageal cancer Neg Hx   . Rectal cancer Neg Hx     SOCIAL HISTORY:  Social History   Socioeconomic History  . Marital status: Married    Spouse name: Gwyndolyn Saxon   . Number of children: 2  . Years of education: 45  . Highest education level: Bachelor's degree (e.g., BA, AB, BS)  Social Needs  . Financial resource strain: Not on file  . Food insecurity - worry: Not on file  . Food insecurity - inability: Not on file  . Transportation needs - medical: Not on file  . Transportation needs - non-medical: Not on file  Occupational History  . Occupation: Retired  Tobacco Use  . Smoking status: Never Smoker  . Smokeless tobacco: Never Used  Substance and Sexual Activity  . Alcohol use: No  . Drug use: No  . Sexual activity: Yes    Partners: Male    Comment: number of sex partners in the last 12 months 1  Other Topics Concern  . Not on file  Social History Narrative   Exercise: walks on treadmill three times a week for 30-45 minutes at a time      Lives at home with husband.   2 cups caffeine daily.   Right-handed.        PHYSICAL EXAM   There were no vitals filed for this visit.  Not recorded      There is no height or weight on file to calculate BMI.  PHYSICAL EXAMNIATION:  Gen: NAD, conversant, well nourised, obese, well groomed                     Cardiovascular: Regular rate rhythm, no peripheral edema, warm, nontender. Eyes: Conjunctivae clear without exudates or hemorrhage Neck: Supple, no carotid bruits. Pulmonary: Clear to auscultation bilaterally   NEUROLOGICAL EXAM:  MENTAL STATUS: Speech:    Speech is normal; fluent and spontaneous with normal comprehension.  Cognition:     Orientation to time, place and person     Normal recent and remote memory     Normal Attention span and concentration     Normal Language, naming,  repeating,spontaneous speech     Fund of knowledge   CRANIAL NERVES: CN II: Visual fields are full to confrontation. Fundoscopic exam is normal with sharp discs and no vascular changes. Pupils are round equal and briskly reactive to light. CN III, IV, VI: extraocular movement are normal. No ptosis. CN V: Facial sensation is intact to pinprick in all 3 divisions bilaterally. Corneal responses are intact.  CN VII: Face is symmetric with normal eye closure and smile. CN VIII: Hearing is normal to rubbing fingers CN IX, X: Palate elevates symmetrically. Phonation is normal. CN XI: Head turning and shoulder shrug are intact CN XII: Tongue is midline with normal movements and no atrophy.  MOTOR: There is no pronator drift of out-stretched arms. Muscle bulk and tone are normal. Muscle strength is normal.  REFLEXES: Reflexes are 3 and symmetric at the biceps, triceps, knees, and ankles. Plantar responses are flexor.  SENSORY: Intact to light touch, pinprick, positional sensation and vibratory sensation are intact in fingers and toes.  COORDINATION: Rapid alternating movements and fine finger movements are intact. There is no dysmetria on finger-to-nose and heel-knee-shin.    GAIT/STANCE: Antalgic, tenderness of pubic symphysis region upon deep palpitation  DIAGNOSTIC DATA (LABS, IMAGING, TESTING) - I reviewed patient records, labs, notes, testing and imaging myself where available.   ASSESSMENT AND PLAN  Tami [name] is a 67 y.o. female   Gait abnormality, urinary urgency, pubic area pain.  Marked sclerotic changes at the pubic symphysis, osteoarthritis changes or osteomyelitis.  MRI of pelvic region w/wo.    Marcial Pacas, M.D. Ph.D.  [facility] [address] [place], [place] [address] Ph: [phone] Fax: [phone]  CC: Tenna Delaine D, PA-C

## 2017-08-19 NOTE — Procedures (Signed)
        Full Name: Tami [name] Gender: Female MRN #: [id number] Date of Birth: 1950-09-22    Visit Date: 08/19/2017 10:57 Age: 67 Years 52 Months Old Examining Physician: Marcial Pacas, MD  Referring Physician: Krista Blue, MD History: 67 year old female, with chronic pelvic pain, gait abnormality due to pain  Summary of the tests:  Nerve conduction study: Right sural, superficial peroneal sensory responses were normal.  Right peroneal to EDB, tibial motor responses were normal.  Electromyography: Selective needle examination of right lower extremity muscles were normal.   Conclusion: This is a normal study.  There is no electrodiagnostic evidence of right lower extremity neuropathy, or right lumbosacral radiculopathy.    ------------------------------- Marcial Pacas, M.D.  [facility] [place], [place] [address] Tel: [phone] Fax: [phone]        [facility]    Nerve / Sites Muscle Latency Ref. Amplitude Ref. Rel Amp Segments Distance Velocity Ref. Area    ms ms mV mV %  cm m/s m/s mVms  R Peroneal - EDB     Ankle EDB 4.0 ?6.5 8.1 ?2.0 100 Ankle - EDB 9   25.9     Fib head EDB 9.9  7.4  91.8 Fib head - Ankle 31 52 ?44 24.9     Pop fossa EDB 11.7  7.3  97.6 Pop fossa - Fib head 10 56 ?44 24.2         Pop fossa - Ankle      R Tibial - AH     Ankle AH 4.4 ?5.8 15.8 ?4.0 100 Ankle - AH 9   37.8     Pop fossa AH 12.5  10.8  68.2 Pop fossa - Ankle 36 44 ?41 35.5         SNC    Nerve / Sites Rec. Site Peak Lat Ref.  Amp Ref. Segments Distance    ms ms V V  cm  R Sural - Ankle (Calf)     Calf Ankle 3.5 ?4.4 9 ?6 Calf - Ankle 14  R Superficial peroneal - Ankle     Lat leg Ankle 3.1 ?4.4 11 ?6 Lat leg - Ankle 14         F  Wave    Nerve F Lat Ref.   ms ms  R Tibial - AH 47.1 ?56.0       EMG full       EMG Summary Table    Spontaneous MUAP Recruitment  Muscle IA Fib PSW Fasc Other Amp Dur. Poly Pattern  R. Tibialis anterior Normal None None  None _______ Normal Normal Normal Normal  R. Tibialis posterior Normal None None None _______ Normal Normal Normal Normal  R. Gastrocnemius (Medial head) Normal None None None _______ Normal Normal Normal Normal  R. Vastus lateralis Normal None None None _______ Normal Normal Normal Normal  R. Peroneus longus Normal None None None _______ Normal Normal Normal Normal

## 2017-09-01 ENCOUNTER — Ambulatory Visit
Admission: RE | Admit: 2017-09-01 | Discharge: 2017-09-01 | Disposition: A | Discharge status: Home / Self Care | Payer: Medicare Other | Source: Ambulatory Visit | Attending: Neurology | Admitting: Neurology

## 2017-09-01 DIAGNOSIS — R102 Pelvic and perineal pain: Secondary | ICD-10-CM

## 2017-09-01 MED ORDER — GADOBENATE DIMEGLUMINE 529 MG/ML IV SOLN
15.0000 mL | Freq: Once | INTRAVENOUS | Status: AC | PRN
  Administered 2017-09-01: 15 mL via INTRAVENOUS

## 2017-09-02 ENCOUNTER — Telehealth: Payer: Self-pay | Admitting: Neurology

## 2017-09-02 DIAGNOSIS — R935 Abnormal findings on diagnostic imaging of other abdominal regions, including retroperitoneum: Secondary | ICD-10-CM

## 2017-09-02 NOTE — Telephone Encounter (Signed)
Please call patient, MRI of the pelvic region showed a small enhancing lesion in the left superior pubic ramus, radiology has suggested bone scan to further evaluate the abnormality, I have ordered Bone scan, also refer her to hematologist.   IMPRESSION: 7 mm enhancing lesion left superior pubic ramus. Metastatic deposit a concern. Bone scan may prove helpful to evaluate for other sites of disease.

## 2017-09-02 NOTE — Telephone Encounter (Signed)
Spoke with Tami [name] and per YY, reviewed below MRI results and rec. for bone scan, hematology referral.  She verbalized understanding of same, sts. has hx. of breast ca, already has an oncologist and has an appt. with him on 09/29/17.  Would like to f/u with him; does not want referral to anyone else.  Will ask that bone scan be done prior to her 3/28 appt. with oncology/fim

## 2017-09-05 NOTE — Telephone Encounter (Signed)
Noted/fim 

## 2017-09-05 NOTE — Telephone Encounter (Signed)
Patient will go to [facility]. 09/15/2017 arrive at 8:45 am . Patient will check in at there radiology Dept. There are no restrictions.   Patient can eat and Drink.  Telephone [phone].  I have talked to patient and went over  all details.

## 2017-09-07 ENCOUNTER — Other Ambulatory Visit: Payer: Self-pay

## 2017-09-07 MED ORDER — OXYCODONE-ACETAMINOPHEN 5-325 MG PO TABS: 1.0000 | ORAL_TABLET | Freq: Four times a day (QID) | ORAL | 0 refills | Status: DC | PRN

## 2017-09-07 MED ORDER — OXYCODONE-ACETAMINOPHEN 5-325 MG PREPACK: 1.0000 | ORAL_TABLET | Freq: Four times a day (QID) | ORAL | 0 refills | Status: DC | PRN

## 2017-09-07 NOTE — Telephone Encounter (Signed)
Camilla with [facility] is calling regarding quantity of oxyCODONE-acetaminophen (PERCOCET) 5-325 mg TABS tablet.

## 2017-09-07 NOTE — Telephone Encounter (Signed)
Patient is having a body scan on 09-15-17. She is having so much discomfort when she sits and wants to know if something can be called in to relieve the pain. [facility] uses [facility] on [address]. Please call and advise.

## 2017-09-07 NOTE — Addendum Note (Signed)
Addended by: Marcial Pacas on: 09/07/2017 04:06 PM   Modules accepted: Orders

## 2017-09-07 NOTE — Telephone Encounter (Signed)
I called pt. I advised her that Dr. Krista Blue has prescribed her oxycodone and sent it to [facility]. Pt verbalized understanding of new RX. Pt had no questions at this time but was encouraged to call back if questions arise.

## 2017-09-08 NOTE — Telephone Encounter (Signed)
I spoke to Dr. Krista Blue. She wishes to keep the RX for percocet at #60. Will call the pharmacy when they reopen at 9:00am.

## 2017-09-08 NOTE — Telephone Encounter (Signed)
I called [facility], spoke to [place], asked her to please keep the RX for percocet as written with #60. She verbalized understanding.

## 2017-09-15 ENCOUNTER — Ambulatory Visit (HOSPITAL_COMMUNITY)
Admission: RE | Admit: 2017-09-15 | Discharge: 2017-09-15 | Disposition: A | Discharge status: Home / Self Care | Payer: Medicare Other | Source: Ambulatory Visit | Attending: Neurology | Admitting: Neurology

## 2017-09-15 DIAGNOSIS — R935 Abnormal findings on diagnostic imaging of other abdominal regions, including retroperitoneum: Secondary | ICD-10-CM | POA: Diagnosis present

## 2017-09-15 DIAGNOSIS — R937 Abnormal findings on diagnostic imaging of other parts of musculoskeletal system: Secondary | ICD-10-CM | POA: Insufficient documentation

## 2017-09-15 DIAGNOSIS — R9349 Abnormal radiologic findings on diagnostic imaging of other urinary organs: Secondary | ICD-10-CM | POA: Diagnosis not present

## 2017-09-15 MED ORDER — TECHNETIUM TC 99M MEDRONATE IV KIT
21.4000 | PACK | Freq: Once | INTRAVENOUS | Status: AC | PRN
  Administered 2017-09-15: 21.4 via INTRAVENOUS

## 2017-09-19 ENCOUNTER — Telehealth: Payer: Self-pay | Admitting: Neurology

## 2017-09-19 NOTE — Telephone Encounter (Signed)
Spoke to patient - she is aware of results.  He has a pending appt w/ Dr. Lindi Adie on 09/29/17.

## 2017-09-19 NOTE — Telephone Encounter (Signed)
Please call patient, bone scan showed no abnormal pubic activity, however, the pubis is obscured by the overlying bladder activity, there is mild degenerative changes at the lower lumbar spine, increased activity noted in the right renal collecting system suggesting hydroureter nephrosis.  Please make sure she has a follow-up appointment with her oncologist,   IMPRESSION: 1. Increased activity noted in the right renal collecting system suggesting hydronephrosis. Right renal atrophy may also be present.  2. Mild increased activity lower lumbar spine, most likely degenerative.  3. No abnormal pubic activity identified however the pubis is obscured by overlying bladder activity.

## 2017-09-29 ENCOUNTER — Telehealth: Payer: Self-pay | Admitting: Hematology and Oncology

## 2017-09-29 ENCOUNTER — Inpatient Hospital Stay: Payer: Medicare Other | Attending: Hematology and Oncology | Admitting: Hematology and Oncology

## 2017-09-29 VITALS — BP 149/83 | HR 61 | Temp 98.2°F | Resp 18 | Ht 64.5 in | Wt 162.0 lb

## 2017-09-29 DIAGNOSIS — Z170 Estrogen receptor positive status [ER+]: Secondary | ICD-10-CM | POA: Insufficient documentation

## 2017-09-29 DIAGNOSIS — R102 Pelvic and perineal pain: Secondary | ICD-10-CM | POA: Diagnosis not present

## 2017-09-29 DIAGNOSIS — C50311 Malignant neoplasm of lower-inner quadrant of right female breast: Secondary | ICD-10-CM | POA: Diagnosis present

## 2017-09-29 DIAGNOSIS — N131 Hydronephrosis with ureteral stricture, not elsewhere classified: Secondary | ICD-10-CM

## 2017-09-29 DIAGNOSIS — Z79811 Long term (current) use of aromatase inhibitors: Secondary | ICD-10-CM | POA: Diagnosis not present

## 2017-09-29 NOTE — Telephone Encounter (Signed)
Gave patient AVs and calendar of upcoming June appointments. Referral sent to [facility] per 3/28 los.

## 2017-09-29 NOTE — Progress Notes (Signed)
Patient Care Team: Donzetta Kohut as PCP - General (Physician Assistant) Terance Ice, MD (Cardiology) Carolan Clines, MD as Attending Physician (Urology) Azucena Fallen, MD as Consulting Physician (Obstetrics and Gynecology) Nicholas Lose, MD as Consulting Physician (Hematology and Oncology)  DIAGNOSIS:  Encounter Diagnosis  Name Primary?  . Malignant neoplasm of lower-inner quadrant of right breast of female, estrogen receptor positive ([place])     SUMMARY OF ONCOLOGIC HISTORY:   Breast cancer of lower-inner quadrant of right female breast ([place])   01/18/2014 Mammogram    U/S Breast: 7 x 7 x 9 mm irregular taller than wide hypoechoic mass with a peripheral echogenic rim in the right breast 4 o'clock position 7 cm from the nipple. No right axillary lymphadenopathy.       02/14/2014 Initial Diagnosis    Breast cancer-right Er 100%, PR 94%, Ki 67: 17%; Her2 Neg ratio 1.74; T1bN0Mo (Clinical Stage 1A)      04/02/2014 Surgery    Right breast lumpectomy: Invasive ductal carcinoma negative for lymph vascular invasion, 1.6 cm, grade 2, 3 SLN negative, reexcision margins benign, ER 100%, PR 94%, HER-2 negative Ki-67 70% T1 C. N0 M0 stage IA: Oncotype DX 29 (19%ROR)      05/07/2014 - 07/09/2014 Chemotherapy    Taxotere Cytoxan adjuvant chemotherapy every 3 weeks x4 cycles (after cycle one hospitalization for pain related to Neulasta and severe gastritis due to steroids)      08/07/2014 - 09/16/2014 Radiation Therapy    Adjuvant radiation therapy      10/04/2014 -  Anti-estrogen oral therapy    Anastrozole 1 mg daily changed to letrozole 09/16/2015 due to hot flashes       CHIEF COMPLIANT: Follow-up to review the bone scan results  INTERVAL HISTORY: Tami [name] is a 67 year old with above-mentioned history of right breast cancer treated with lumpectomy followed by adjuvant chemotherapy and radiation is currently on letrozole.  She could not tolerate anastrozole.  She  is complaining of a lot of pelvic pain for which she underwent a pelvic MRI that revealed a small lesion in the inferior right pubic ramus.  This was further evaluated with a bone scan and is here today to discuss the results.  She continues to have lots of pelvic pain especially in the groin area.  REVIEW OF SYSTEMS:   Constitutional: Denies fevers, chills or abnormal weight loss Eyes: Denies blurriness of vision Ears, nose, mouth, throat, and face: Denies mucositis or sore throat Respiratory: Denies cough, dyspnea or wheezes Cardiovascular: Denies palpitation, chest discomfort Gastrointestinal:  Denies nausea, heartburn or change in bowel habits Skin: Denies abnormal skin rashes Lymphatics: Denies new lymphadenopathy or easy bruising Neurological:Denies numbness, tingling or new weaknesses Behavioral/Psych: Mood is stable, no new changes  Extremities: No lower extremity edema Breast:  denies any pain or lumps or nodules in either breasts All other systems were reviewed with the patient and are negative.  I have reviewed the past medical history, past surgical history, social history and family history with the patient and they are unchanged from previous note.  ALLERGIES:  is allergic to latex; sulfonamide derivatives; clarithromycin; crestor [rosuvastatin]; livalo [pitavastatin]; statins; and ciprofloxacin hcl.  MEDICATIONS:  Current Outpatient Medications  Medication Sig Dispense Refill  . albuterol (PROVENTIL HFA;VENTOLIN HFA) 108 (90 Base) MCG/ACT inhaler Inhale 2 puffs into the lungs every 4 (four) hours as needed (cough, shortness of breath or wheezing.). 1 Inhaler 3  . amitriptyline (ELAVIL) 25 MG tablet Take 25 mg by mouth at bedtime  as needed for sleep. When patient has migraines    . aspirin EC 81 MG tablet Take 81 mg by mouth daily.    . Calcium Carbonate-Vitamin D (CALTRATE 600+D PO) Take by mouth.    . cetirizine (ZYRTEC) 10 MG tablet Take 10 mg by mouth daily.    .  Cholecalciferol (VITAMIN D3) 5000 units CAPS Take 10,000 Units by mouth 2 (two) times daily.    . flecainide (TAMBOCOR) 100 MG tablet TAKE ONE-HALF TABLET BY MOUTH TWICE DAILY 90 tablet 2  . fluticasone (FLONASE) 50 MCG/ACT nasal spray Place 2 sprays into both nostrils daily. 16 g 3  . ibuprofen (ADVIL,MOTRIN) 200 MG tablet Take 200 mg by mouth every 6 (six) hours as needed for headache or moderate pain.    [name] letrozole (FEMARA) 2.5 MG tablet Take 1 tablet (2.5 mg total) by mouth daily. 90 tablet 4  . Meth-Hyo-M Bl-Na Phos-Ph Sal (URIBEL) 118 MG CAPS Take 118 mg by mouth 4 (four) times daily as needed (for bladder). Reported on 09/09/2015  2  . metoprolol succinate (TOPROL-XL) 50 MG 24 hr tablet TAKE 1 TABLET BY MOUTH DAILY. TAKE WITH OR IMMEDIATELY FOLLOWING A MEAL. 90 tablet 2  . oxyCODONE-acetaminophen (PERCOCET/ROXICET) 5-325 MG tablet Take 1 tablet by mouth every 6 (six) hours as needed for severe pain. 60 tablet 0  . PARoxetine (PAXIL) 20 MG tablet Take 1 tablet (20 mg total) by mouth daily. 90 tablet 1  . vitamin B-12 (CYANOCOBALAMIN) 1000 MCG tablet Take 1,000 mcg by mouth daily.     Current Facility-Administered Medications  Medication Dose Route Frequency Provider Last Rate Last Dose  . 0.9 %  sodium chloride infusion  500 mL Intravenous Continuous Tami Shipper, MD        PHYSICAL EXAMINATION: ECOG PERFORMANCE STATUS: 1 - Symptomatic but completely ambulatory  Vitals:   09/29/17 1450  BP: (!) 149/83  Pulse: 61  Resp: 18  Temp: 98.2 F (36.8 C)  SpO2: 99%   Filed Weights   09/29/17 1450  Weight: 162 lb (73.5 kg)    GENERAL:alert, no distress and comfortable SKIN: skin color, texture, turgor are normal, no rashes or significant lesions EYES: normal, Conjunctiva are pink and non-injected, sclera clear OROPHARYNX:no exudate, no erythema and lips, buccal mucosa, and tongue normal  NECK: supple, thyroid normal size, non-tender, without nodularity LYMPH:  no palpable  lymphadenopathy in the cervical, axillary or inguinal LUNGS: clear to auscultation and percussion with normal breathing effort HEART: regular rate & rhythm and no murmurs and no lower extremity edema ABDOMEN:abdomen soft, non-tender and normal bowel sounds MUSCULOSKELETAL:no cyanosis of digits and no clubbing  NEURO: alert & oriented x 3 with fluent speech, no focal motor/sensory deficits EXTREMITIES: No lower extremity edema  LABORATORY DATA:  I have reviewed the data as listed CMP Latest Ref Rng & Units 07/28/2016 04/22/2016 12/16/2015  Glucose 65 - 99 mg/dL 95 82 93  BUN 7 - 25 mg/dL 10 6.4(L) 7  Creatinine 0.50 - 0.99 mg/dL 0.86 0.9 0.77  Sodium 135 - 146 mmol/L 142 142 142  Potassium 3.5 - 5.3 mmol/L 4.5 3.8 4.4  Chloride 98 - 110 mmol/L 107 - 107  CO2 20 - 31 mmol/L _0 Calcium 8.6 - 10.4 mg/dL 9.5 9.8 9.8  Total Protein 6.1 - 8.1 g/dL 6.9 7.9 7.4  Total Bilirubin 0.2 - 1.2 mg/dL 0.4 0.38 0.5  Alkaline Phos 33 - 130 U/L 86 107 96  AST 10 - 35 U/L 20  18 16  ALT 6 - 29 U/L _0 Lab Results  Component Value Date   WBC 4.2 05/09/2017   HGB 12.9 05/09/2017   HCT 37.0 05/09/2017   MCV 90 05/09/2017   PLT 275 05/09/2017   NEUTROABS 2.5 05/09/2017    ASSESSMENT & PLAN:  Breast cancer of lower-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 status post lumpectomy, Oncotype DX recurrence score 29 intermediate risk 19% risk of recurrence with hormonal therapy alone  Chemotherapy summary: Taxotere and Cytoxan 4 started 05/07/2014 completed 07/09/2014 Long-term chemotherapy toxicities: Neuropathy Radiation therapy: Started 08/07/2014 completed 09/16/14 ---------------------------------------------------------------------------- Current treatment: Anastrozole 1 mg daily started 10/04/14 switched to letrozole 09/16/2015 Letrozole Toxicities: 1. Depression: we switched her  treatment from Paxil to Effexor.  2. Hot flashes: Much better on letrozole.  Cognitive dysfunction: We discussed the importance of physical exercise, eating healthy, reading books and puzzles. I encouraged her to participate in cognitive dysfunction clinical trial cc CW [id number] randomizedbetween donepezil and placebo for 24 weeks. Patient is interested in this trial.  Breast Cancer Surveillance: 1. Breast exam 09/29/2017: Normal 2. Mammogram 9/7/2018n abnormalities. Postsurgical changes. Breast Density Category C.  08/24/2017 MRI pelvis: 7 mm enhancing lesion superior left pubic ramus 09/15/2017: Bone scan: No evidence of metastatic disease 08/09/2017: MRI brain: No metastases, mild chronic small vessel ischemic disease  Radiology review: I reviewed the results of all of the scans and tests.  There is no evidence of metastatic disease. I discussed with the patient that the pelvic pain could be related to letrozole therapy.  I instructed her to stop the letrozole for 3 weeks.  If her pain gets better than she will call us and we will switch her to a different antiestrogen therapy.  We could initiate therapy with Aromasin if necessary.  RTC in 3 months for follow-up exam    No orders of the defined types were placed in this encounter.  The patient has a good understanding of the overall plan. she agrees with it. she will call with any problems that may develop before the next visit here.   Harriette Ohara, MD 09/29/17

## 2017-09-29 NOTE — Assessment & Plan Note (Signed)
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 status post lumpectomy, Oncotype DX recurrence score 29 intermediate risk 19% risk of recurrence with hormonal therapy alone  Chemotherapy summary: Taxotere and Cytoxan 4 started 05/07/2014 completed 07/09/2014 Long-term chemotherapy toxicities: Neuropathy Radiation therapy: Started 08/07/2014 completed 09/16/14 ---------------------------------------------------------------------------- Current treatment: Anastrozole 1 mg daily started 10/04/14 switched to letrozole 09/16/2015 Letrozole Toxicities: 1. Depression: we switched her treatment from Paxil to Effexor.  2. Hot flashes: Much better on letrozole.  Cognitive dysfunction: We discussed the importance of physical exercise, eating healthy, reading books and puzzles. I encouraged her to participate in cognitive dysfunction clinical trial cc CW [id number] randomizedbetween donepezil and placebo for 24 weeks. Patient is interested in this trial.  Breast Cancer Surveillance: 1. Breast exam 09/29/2017: Normal 2. Mammogram 9/7/2018n abnormalities. Postsurgical changes. Breast Density Category C.  08/24/2017 MRI pelvis: 7 mm enhancing lesion superior left pubic ramus 09/15/2017: Bone scan: No evidence of metastatic disease 08/09/2017: MRI brain: No metastases, mild chronic small vessel ischemic disease  Radiology review: I reviewed the results of all of the scans and tests.  There is no evidence of metastatic disease.  RTC in one year for follow-up exam

## 2017-10-05 ENCOUNTER — Other Ambulatory Visit: Payer: Self-pay | Admitting: Urology

## 2017-10-05 DIAGNOSIS — N131 Hydronephrosis with ureteral stricture, not elsewhere classified: Secondary | ICD-10-CM

## 2017-10-06 ENCOUNTER — Ambulatory Visit (INDEPENDENT_AMBULATORY_CARE_PROVIDER_SITE_OTHER): Payer: Medicare Other | Admitting: Cardiovascular Disease

## 2017-10-06 ENCOUNTER — Encounter: Payer: Self-pay | Admitting: Cardiovascular Disease

## 2017-10-06 VITALS — BP 134/78 | HR 55 | Ht 64.5 in | Wt 160.0 lb

## 2017-10-06 DIAGNOSIS — I10 Essential (primary) hypertension: Secondary | ICD-10-CM

## 2017-10-06 DIAGNOSIS — R400 Somnolence: Secondary | ICD-10-CM | POA: Diagnosis not present

## 2017-10-06 DIAGNOSIS — I480 Paroxysmal atrial fibrillation: Secondary | ICD-10-CM | POA: Diagnosis not present

## 2017-10-06 DIAGNOSIS — Z79899 Other long term (current) drug therapy: Secondary | ICD-10-CM | POA: Diagnosis not present

## 2017-10-06 DIAGNOSIS — G4700 Insomnia, unspecified: Secondary | ICD-10-CM

## 2017-10-06 DIAGNOSIS — G478 Other sleep disorders: Secondary | ICD-10-CM

## 2017-10-06 DIAGNOSIS — R0683 Snoring: Secondary | ICD-10-CM

## 2017-10-06 DIAGNOSIS — I471 Supraventricular tachycardia: Secondary | ICD-10-CM | POA: Diagnosis not present

## 2017-10-06 DIAGNOSIS — E7849 Other hyperlipidemia: Secondary | ICD-10-CM

## 2017-10-06 NOTE — Progress Notes (Signed)
Patient ID: Tami [name], female   DOB: 1951/01/08, 67 y.o.   MRN: [id number]    HPI: Tami [name] is a 67 year old African American female who is a former patient of Tami [name]. She presents for a 15-monthcardiology evaluation.  Tami [name] a history of palpitations, paroxysmal supraventricular tachycardia as well as paroxysmal atrial fibrillation which have been detected on remote monitoring. She has been treated with low-dose flecainide 50 mg twice a day in addition to metoprolol succinate 50 mg daily. She also has a history of hyperlipidemia in the past has had LDLs in the 145 range but is intolerant to several statins including Crestor. She also states she developed some myalgias secondary to Zetia. When she was last seen by Dr. WRollene Farehe had written her a prescription for livalo 1 mg but she never took this.  She also has continued problems with low back and hip discomfort and has some sciatica issues. She is followed by Tami [name] She also has undergone MRIs of her back.  An echo Doppler study in February 2012 showed normal systolic and diastolic function. She did not have significant valvular pathology. A nuclear perfusion study in 2010 which was normal.  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  I saw her prior to undergoing her lumpectomy and prior to radiation and chemotherapy.  A subsequent 2-D echo Doppler study on 03/28/2014 showed an ejection fraction at 60-65% and was normal.  She tolerated lumpectomy well and completed chemotherapy and radiation and  is on letrozole for planned 7 years.   I last saw her in June 2017.  Since that time, she has felt well from a cardiac standpoint.  She specifically denies chest pain, palpitations, awareness of arrhythmias.  She has  noted some back and leg weakness.  [name]  She states that her sleep is poor.  She typically goes to bed at 9:30 PM but wakes up at 7:30 AM.  She admits to snoring, and frequent nocturia at least 3-5 times per night.  Unaware of any further episodes of rhythm disturbance and has continued to be on flecainide 50 mg twice a day in addition to Toprol-XL 50 mg.  Has anxiety for which she is on Paxil.  She presents for evaluation.  Past Medical History:  Diagnosis Date  . Anemia    as a child  . Anxiety   . Arthritis   . Asthma   . Atrial fibrillation ([place]   . [place]  . Depression with anxiety   . Dysrhythmia    hx AF  . Family history of malignant neoplasm of breast   . Fibromyalgia   . Heart palpitations   . Heartburn   . Hyperlipidemia   . Invasive ductal carcinoma of right breast ([place] 02[place]   [place]  . Irregular heartbeat   . Malignant neoplasm of breast (female), unspecified site   . Migraine   . Numbness of arm    Right Arm  . Osteoporosis   . Personal history of chemotherapy 2015   Right Breast Cancer  . Personal history of radiation therapy 2015   Right Breast Cancer  . Pure hypercholesterolemia   . SVT (supraventricular tachycardia) (HCC)     Past Surgical History:  Procedure Laterality  Date  . 30 Day Monitor  04/10/2009 to 05/23/2009   No End Of Summary Report Atrial Fib,Narrow Complex Tachycardia,Sinus Rhythm   . ABDOMINAL HYSTERECTOMY  1987  . BREAST LUMPECTOMY Right 2015  . BREAST LUMPECTOMY WITH NEEDLE LOCALIZATION AND AXILLARY SENTINEL LYMPH NODE BX Right 04/02/2014   Procedure: Right Axillary Lymphatic Mapping; Right axillary Sentinal Node Biopsy; Right Partial Mastectomy after wire localization;  Surgeon: Jackolyn Confer, MD;  Location: [facility];  Service: General;  Laterality: Right;  . BREAST SURGERY     Left  . CYSTOSCOPY  04/25/2012   Pt may be a candidate for research anti-incontinence  procedure protocol  . Dipyridamole Myoview  05/27/2009   The post stress myocardial perfusion images show a normal pattern of perfusion in all regions.The post stress ejection fraction is 83 %. Normal  myocardial perfusion imaging This is a low risk scan.  . DOPPLER ECHOCARDIOGRAPHY  08/21/2010   Ejecion Fraction =>55% All chambers are normal in size and function No significant change from 2010.  [name] lower Venous Dopplers  04/11/2009   Normal bilateral lower ext venous duplex  . PORTACATH PLACEMENT N/A 04/26/2014   Procedure: ULTRASOUND GUIDED PORT A CATHE INSERTION;  Surgeon: Jackolyn Confer, MD;  Location: [facility];  Service: General;  Laterality: N/A;  . Upper Arterial Dopplers  06/02/2011   Right Side PPG Demonstrates siginificantly diminshed waveforms with the Adson's maneuver which may suggest Thoracic Outlet Syndrone. This is a abnormal thoracic outlet arterial evaluation.    Allergies  Allergen Reactions  . Latex Swelling and Rash    Skin swelling   . Sulfonamide Derivatives Anaphylaxis, Shortness Of Breath, Nausea Only and Other (See Comments)    Sweating, headache   . Clarithromycin Nausea Only and Other (See Comments)    Headache   . Crestor [Rosuvastatin] Other (See Comments)    Muscle aches, pain, weakness in lower extremities  . Livalo [Pitavastatin] Other (See Comments)    Leg pain  . Statins Other (See Comments)    Muscle soreness and weakness w/ Rosuvastatin and Zetia.  . Ciprofloxacin Hcl Nausea Only, Palpitations and Other (See Comments)    Headache     Current Outpatient Medications  Medication Sig Dispense Refill  . albuterol (PROVENTIL HFA;VENTOLIN HFA) 108 (90 Base) MCG/ACT inhaler Inhale 2 puffs into the lungs every 4 (four) hours as needed (cough, shortness of breath or wheezing.). 1 Inhaler 3  . amitriptyline (ELAVIL) 25 MG tablet Take 25 mg by mouth at bedtime as needed for sleep. When patient has migraines    . aspirin EC 81 MG tablet Take 81 mg by mouth  daily.    . Calcium Carbonate-Vitamin D (CALTRATE 600+D PO) Take by mouth.    . cetirizine (ZYRTEC) 10 MG tablet Take 10 mg by mouth daily.    . Cholecalciferol (VITAMIN D3) 5000 units CAPS Take 5,000 Units by mouth 2 (two) times daily.     . flecainide (TAMBOCOR) 100 MG tablet TAKE ONE-HALF TABLET BY MOUTH TWICE DAILY 90 tablet 2  . fluticasone (FLONASE) 50 MCG/ACT nasal spray Place 2 sprays into both nostrils daily. 16 g 3  . ibuprofen (ADVIL,MOTRIN) 200 MG tablet Take 200 mg by mouth every 6 (six) hours as needed for headache or moderate pain.    . Meth-Hyo-M Bl-Na Phos-Ph Sal (URIBEL) 118 MG CAPS Take 118 mg by mouth 4 (four) times daily as needed (for bladder). Reported on 09/09/2015  2  . metoprolol succinate (TOPROL-XL) 50 MG 24 hr tablet TAKE 1  TABLET BY MOUTH DAILY. TAKE WITH OR IMMEDIATELY FOLLOWING A MEAL. 90 tablet 2  . oxyCODONE-acetaminophen (PERCOCET/ROXICET) 5-325 MG tablet Take 1 tablet by mouth every 6 (six) hours as needed for severe pain. 60 tablet 0  . PARoxetine (PAXIL) 20 MG tablet Take 1 tablet (20 mg total) by mouth daily. 90 tablet 1  . letrozole (FEMARA) 2.5 MG tablet Take 1 tablet (2.5 mg total) by mouth daily. (Patient not taking: Reported on 10/06/2017) 90 tablet 4   Current Facility-Administered Medications  Medication Dose Route Frequency Provider Last Rate Last Dose  . 0.9 %  sodium chloride infusion  500 mL Intravenous Continuous Irene Shipper, MD        Social history is notable in that she is married and has 2 children and 3 grandchildren. 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 There is no tobacco or alcohol use.   Family History  Problem Relation Age of Onset  . Cancer Mother        skin cancer  . Diabetes Sister        Borderline  . Hypertension Sister   . Heart attack Sister   . Breast cancer Sister 71       maternal half-sister; deceased 29  . Diabetes Brother        Borderline; maternal half-brother  .  Cancer Maternal Aunt        stomach and throat; heavy smoker and drinker; deceased 41  . Stomach cancer Maternal Aunt   . Cancer Other        breast/ovarian; daughter of mat cousin with unk. primary at 61; pt's 1st cousin-once-removed  . Other Father        accident  . Cancer Cousin 72       unk. primary; daughter of mat aunt with stomach/throat ca  . Colon cancer Neg Hx   . Esophageal cancer Neg Hx   . Rectal cancer Neg Hx     ROS General: Negative; No fevers, chills, or night sweats;  HEENT: Negative; No changes in vision or hearing, sinus congestion, difficulty swallowing Pulmonary: Negative; No cough, wheezing, shortness of breath, hemoptysis Cardiovascular: Negative; No chest pain, presyncope, syncope, palpitations Positive for occasional ankle swelling GI: Negative; No nausea, vomiting, diarrhea, or abdominal pain GU: Negative; No dysuria, hematuria, or difficulty voiding Musculoskeletal: Negative; no myalgias, joint pain, or weakness Hematologic/Oncology: As above, positive for invasive ductal carcinoma of the right breast .  Status post chemotherapy and radiation treatment following lumpectomy Endocrine: Negative; no heat/cold intolerance; no diabetes Neuro: Negative; no changes in balance, headaches Skin: Negative; No rashes or skin lesions Psychiatric: Negative; No behavioral problems, depression Sleep: Nonrestorative sleep, snoring, frequent nocturia, daytime sleepiness, no bruxism, restless legs, hypnogognic hallucinations, no cataplexy Other comprehensive 14 point system review is negative.   PE BP 134/78 (BP Location: Left Arm, Patient Position: Sitting, Cuff Size: Normal)   Pulse (!) 55   Ht 5' 4.5" (1.638 m)   Wt 160 lb (72.6 kg)   LMP 07/06/1985 (Approximate)   BMI 27.04 kg/m    Repeat blood pressure by me was 122/78  Wt Readings from Last 3 Encounters:  10/06/17 160 lb (72.6 kg)  09/29/17 162 lb (73.5 kg)  07/26/17 158 lb 12 oz (72 kg)   General:  Alert, oriented, no distress.  Skin: normal turgor, no rashes, warm and dry HEENT: Normocephalic, atraumatic. Pupils equal round and reactive to light; sclera anicteric; extraocular muscles intact;  Nose  without nasal septal hypertrophy Mouth/Parynx benign; Mallinpatti scale 3 Neck: No JVD, no carotid bruits; normal carotid upstroke Lungs: clear to ausculatation and percussion; no wheezing or rales Chest wall: without tenderness to palpitation Heart: PMI not displaced, RRR, s1 s2 normal, 1/6 systolic murmur, no diastolic murmur, no rubs, gallops, thrills, or heaves Abdomen: soft, nontender; no hepatosplenomehaly, BS+; abdominal aorta nontender and not dilated by palpation. Back: no CVA tenderness Pulses 2+ Musculoskeletal: full range of motion, normal strength, no joint deformities Extremities: no clubbing cyanosis or edema, Homan's sign negative  Neurologic: grossly nonfocal; Cranial nerves grossly wnl Psychologic: Normal mood and affect    ECG (independently read by me): Sinus bradycardia 55 bpm with first-degree AV block.  PR interval 258 ms.  June 2017 ECG (independently read by me): Sinus bradycardia 55 bpm.  First-degree AV block with a PR interval at 250 ms.  Nonspecific ST-T changes.  April 2016 ECG (independently read by me): Normal sinus rhythm at 63 bpm.  First-degree AV block with a PR interval at 254 ms.  QTc interval 49 ms.  Nonspecific T changes.  September 2015 ECG sinus rhythm with first-degree AV block.  Nonspecific T changes V1 through V3.  Prior ECG: Sinus rhythm at 57 beats per minute, with first degree AV block with PR interval 252 ms. QTc interval is normal at 389 ms. Nonspecific T changes.  LABS:  BMP Latest Ref Rng & Units 10/12/2017 07/28/2016 04/22/2016  Glucose 65 - 99 mg/dL 101(H) 95 82  BUN 8 - 27 mg/dL 10 10 6.4(L)  Creatinine 0.57 - 1.00 mg/dL 0.88 0.86 0.9  BUN/Creat Ratio 12 - 28 11(L) - -  Sodium 134 - 144 mmol/L 142 142 142  Potassium 3.5 - 5.2  mmol/L 4.1 4.5 3.8  Chloride 96 - 106 mmol/L 105 107 -  CO2 20 - 29 mmol/L 22 30 24   Calcium 8.7 - 10.3 mg/dL 9.7 9.5 9.8     Hepatic Function Latest Ref Rng & Units 10/12/2017 07/28/2016 04/22/2016  Total Protein 6.0 - 8.5 g/dL 7.2 6.9 7.9  Albumin 3.6 - 4.8 g/dL 4.4 4.1 3.9  AST 0 - 40 IU/L 30 20 18   ALT 0 - 32 IU/L 33(H) 16 12  Alk Phosphatase 39 - 117 IU/L 115 86 107  Total Bilirubin 0.0 - 1.2 mg/dL 0.4 0.4 0.38     CBC Latest Ref Rng & Units 10/12/2017 05/09/2017 04/22/2016  WBC 3.4 - 10.8 x10E3/uL 3.4 4.2 5.6  Hemoglobin 11.1 - 15.9 g/dL 13.1 12.9 13.3  Hematocrit 34.0 - 46.6 % 37.9 37.0 39.8  Platelets 150 - 379 x10E3/uL 243 275 233   Lab Results  Component Value Date   MCV 93 10/12/2017   MCV 90 05/09/2017   MCV 94.0 04/22/2016    Lab Results  Component Value Date   TSH 2.080 10/12/2017   No results found for: HGBA1C   Lipid Panel     Component Value Date/Time   CHOL 248 (H) 10/12/2017 1142   CHOL 237 (H) 06/22/2013 1405   TRIG 108 10/12/2017 1142   TRIG 103 06/22/2013 1405   HDL 72 10/12/2017 1142   HDL 74 06/22/2013 1405   CHOLHDL 3.4 10/12/2017 1142   CHOLHDL 3.2 07/28/2016 1151   VLDL 22 07/28/2016 1151   LDLCALC 154 (H) 10/12/2017 1142   LDLCALC 142 (H) 06/22/2013 1405     BNP No results found for: PROBNP  Lipid Panel     Component Value Date/Time   CHOL 248 (H) 10/12/2017  1142   CHOL 237 (H) 06/22/2013 1405   TRIG 108 10/12/2017 1142   TRIG 103 06/22/2013 1405   HDL 72 10/12/2017 1142   HDL 74 06/22/2013 1405   CHOLHDL 3.4 10/12/2017 1142   CHOLHDL 3.2 07/28/2016 1151   VLDL 22 07/28/2016 1151   LDLCALC 154 (H) 10/12/2017 1142   LDLCALC 142 (H) 06/22/2013 1405     RADIOLOGY: No results found.  IMPRESSION:  1. PAF (paroxysmal atrial fibrillation) ([place])   2. PSVT   3. Essential hypertension   4. Other hyperlipidemia   5. Medication management   6. Frequent nocturnal awakening   7. Snoring   8. Non-restorative sleep   9.  Daytime sleepiness    ASSESSMENT AND PLAN: Tami [name] is a 67 year old African-American female who has a history of PSVT/PAF which has been  controlled with low-dose flecainide 50 twice a day combined with beta blocker therapy. She denies any recent chest pain.  She is maintaining sinus rhythm on ECG and there is evidence for mild first-degree AV block. She is followed by Dr. Lindi Adie for her breast CA.  An echo Doppler study done prior to chemotherapy was entirely normal with an ejection fraction of 60-65%.  Blood pressure today is stable on Toprol-XL 50 mg daily.  Remotely, she has had some issues with mild swelling and is no longer on any as needed diuretic.  Was evaluated by neurology for her back discomfort and weakness.  He also has been recently having poor sleep and I am concerned of potential sleep apnea.  I have recommended a sleep study for further evaluation.  She has not had recent laboratory.  January 2008 lipid studies were elevated with an LDL of 143.  She is not on any lipid-lowering therapy.  A complete set of fasting labs will be obtained.  I will see her in 3-4 months for reevaluation..  Time spent: 25 minutes  Troy Sine, MD, [facility] 10/13/2017 4:29 PM

## 2017-10-06 NOTE — Patient Instructions (Signed)
Medication Instructions:  Your physician recommends that you continue on your current medications as directed. Please refer to the Current Medication list given to you today.  Labwork: Please return for FASTING labs (CMET, CBC, Lipid, TSH)  Our in office lab hours are Monday-Friday 8:00-4:00, closed for lunch 12:45-1:45 pm.  No appointment needed.  Testing/Procedures: Your physician has recommended that you have a sleep study. This test records several body functions during sleep, including: brain activity, eye movement, oxygen and carbon dioxide blood levels, heart rate and rhythm, breathing rate and rhythm, the flow of air through your mouth and nose, snoring, body muscle movements, and chest and belly movement.  Follow-Up: 4 months with Dr. Claiborne Billings  Any Other Special Instructions Will Be Listed Below (If Applicable).     If you need a refill on your cardiac medications before your next appointment, please call your pharmacy.

## 2017-10-13 ENCOUNTER — Encounter: Payer: Self-pay | Admitting: Cardiovascular Disease

## 2017-10-13 LAB — COMPREHENSIVE METABOLIC PANEL
ALT: 33 IU/L — AB (ref 0–32)
AST: 30 IU/L (ref 0–40)
Albumin/Globulin Ratio: 1.6 (ref 1.2–2.2)
Albumin: 4.4 g/dL (ref 3.6–4.8)
Alkaline Phosphatase: 115 IU/L (ref 39–117)
BUN/Creatinine Ratio: 11 — ABNORMAL LOW (ref 12–28)
BUN: 10 mg/dL (ref 8–27)
Bilirubin Total: 0.4 mg/dL (ref 0.0–1.2)
CALCIUM: 9.7 mg/dL (ref 8.7–10.3)
CO2: 22 mmol/L (ref 20–29)
Chloride: 105 mmol/L (ref 96–106)
Creatinine, Ser: 0.88 mg/dL (ref 0.57–1.00)
GFR, EST AFRICAN AMERICAN: 79 mL/min/{1.73_m2} (ref >59)
GFR, EST NON AFRICAN AMERICAN: 68 mL/min/{1.73_m2} (ref >59)
GLUCOSE: 101 mg/dL — AB (ref 65–99)
Globulin, Total: 2.8 g/dL (ref 1.5–4.5)
Potassium: 4.1 mmol/L (ref 3.5–5.2)
Sodium: 142 mmol/L (ref 134–144)
TOTAL PROTEIN: 7.2 g/dL (ref 6.0–8.5)

## 2017-10-13 LAB — CBC
HEMATOCRIT: 37.9 % (ref 34.0–46.6)
Hemoglobin: 13.1 g/dL (ref 11.1–15.9)
MCH: 32 pg (ref 26.6–33.0)
MCHC: 34.6 g/dL (ref 31.5–35.7)
MCV: 93 fL (ref 79–97)
Platelets: 243 10*3/uL (ref 150–379)
RBC: 4.09 x10E6/uL (ref 3.77–5.28)
RDW: 14.7 % (ref 12.3–15.4)
WBC: 3.4 10*3/uL (ref 3.4–10.8)

## 2017-10-13 LAB — LIPID PANEL
CHOL/HDL RATIO: 3.4 ratio (ref 0.0–4.4)
Cholesterol, Total: 248 mg/dL — ABNORMAL HIGH (ref 100–199)
HDL: 72 mg/dL (ref >39)
LDL Calculated: 154 mg/dL — ABNORMAL HIGH (ref 0–99)
Triglycerides: 108 mg/dL (ref 0–149)
VLDL Cholesterol Cal: 22 mg/dL (ref 5–40)

## 2017-10-13 LAB — TSH: TSH: 2.08 u[IU]/mL (ref 0.450–4.500)

## 2017-10-14 ENCOUNTER — Telehealth: Payer: Self-pay | Admitting: *Deleted

## 2017-10-14 NOTE — Telephone Encounter (Signed)
Patient notified of sleep study appointment. Patient asked if her insurance had approved the study. Informed patient  that I was informed by them no PA required. Suggested to her she may want to however contact them if she wants to know how much she will have to pay. Patient given  CPT code [phone] for the insurance to reference.

## 2017-10-14 NOTE — Telephone Encounter (Signed)
-----   Message from Roland Earl sent at 10/06/2017 11:39 AM EDT ----- Regarding: Sleep Study Ordered by Dr. Claiborne Billings

## 2017-10-21 ENCOUNTER — Other Ambulatory Visit: Payer: Self-pay | Admitting: *Deleted

## 2017-10-21 DIAGNOSIS — Z79899 Other long term (current) drug therapy: Secondary | ICD-10-CM

## 2017-10-21 DIAGNOSIS — E7849 Other hyperlipidemia: Secondary | ICD-10-CM

## 2017-10-21 MED ORDER — ROSUVASTATIN CALCIUM 5 MG PO TABS: 5.0000 mg | ORAL_TABLET | Freq: Every day | ORAL | 3 refills | Status: DC

## 2017-10-28 ENCOUNTER — Encounter (HOSPITAL_COMMUNITY)
Admission: RE | Admit: 2017-10-28 | Discharge: 2017-10-28 | Disposition: A | Discharge status: Home / Self Care | Payer: Medicare Other | Source: Ambulatory Visit | Attending: Urology | Admitting: Urology

## 2017-10-28 DIAGNOSIS — N131 Hydronephrosis with ureteral stricture, not elsewhere classified: Secondary | ICD-10-CM | POA: Diagnosis present

## 2017-10-28 MED ORDER — FUROSEMIDE 10 MG/ML IJ SOLN
INTRAMUSCULAR | Status: AC
  Filled 2017-10-28: qty 4

## 2017-10-28 MED ORDER — FUROSEMIDE 10 MG/ML IJ SOLN
36.0000 mg | Freq: Once | INTRAMUSCULAR | Status: AC
  Administered 2017-10-28: 36 mg via INTRAVENOUS

## 2017-10-28 MED ORDER — TECHNETIUM TC 99M MERTIATIDE
5.0000 | Freq: Once | INTRAVENOUS | Status: AC | PRN
  Administered 2017-10-28: 5 via INTRAVENOUS

## 2017-11-06 ENCOUNTER — Ambulatory Visit (HOSPITAL_BASED_OUTPATIENT_CLINIC_OR_DEPARTMENT_OTHER): Payer: Medicare Other | Attending: Cardiovascular Disease | Admitting: Cardiovascular Disease

## 2017-11-06 VITALS — Ht 64.0 in | Wt 160.0 lb

## 2017-11-06 DIAGNOSIS — G4710 Hypersomnia, unspecified: Secondary | ICD-10-CM | POA: Diagnosis not present

## 2017-11-06 DIAGNOSIS — R400 Somnolence: Secondary | ICD-10-CM

## 2017-11-06 DIAGNOSIS — G4736 Sleep related hypoventilation in conditions classified elsewhere: Secondary | ICD-10-CM

## 2017-11-06 DIAGNOSIS — R0683 Snoring: Secondary | ICD-10-CM | POA: Insufficient documentation

## 2017-11-06 DIAGNOSIS — I10 Essential (primary) hypertension: Secondary | ICD-10-CM | POA: Diagnosis not present

## 2017-11-06 DIAGNOSIS — G478 Other sleep disorders: Secondary | ICD-10-CM

## 2017-11-06 DIAGNOSIS — I480 Paroxysmal atrial fibrillation: Secondary | ICD-10-CM | POA: Insufficient documentation

## 2017-11-06 DIAGNOSIS — R0902 Hypoxemia: Secondary | ICD-10-CM | POA: Insufficient documentation

## 2017-11-06 DIAGNOSIS — G4700 Insomnia, unspecified: Secondary | ICD-10-CM

## 2017-11-06 DIAGNOSIS — I471 Supraventricular tachycardia: Secondary | ICD-10-CM

## 2017-11-29 ENCOUNTER — Encounter (HOSPITAL_BASED_OUTPATIENT_CLINIC_OR_DEPARTMENT_OTHER): Payer: Self-pay | Admitting: Cardiovascular Disease

## 2017-11-29 NOTE — Procedures (Signed)
Patient Name: Tami [name], Tami [name] Date: 11/06/2017 Gender: Female D.O.B: 05/17/1951 Age (years): 57 Referring Provider: Shelva Majestic MD, ABSM Height (inches): 64 Interpreting Physician: Shelva Majestic MD, ABSM Weight (lbs): 160 RPSGT: Jorge Ny BMI: 27 MRN: [id number] Neck Size: 14.00 <br> <br> CLINICAL INFORMATION Sleep Study Type: NPSG  Indication for sleep study: Fatigue, Snoring  Epworth Sleepiness Score: 13  SLEEP STUDY TECHNIQUE As per the AASM Manual for the Scoring of Sleep and Associated Events v2.3 (April 2016) with a hypopnea requiring 4% desaturations.  The channels recorded and monitored were frontal, central and occipital EEG, electrooculogram (EOG), submentalis EMG (chin), nasal and oral airflow, thoracic and abdominal wall motion, anterior tibialis EMG, snore microphone, electrocardiogram, and pulse oximetry.  MEDICATIONS     albuterol (PROVENTIL HFA;VENTOLIN HFA) 108 (90 Base) MCG/ACT inhaler             amitriptyline (ELAVIL) 25 MG tablet         aspirin EC 81 MG tablet         Calcium Carbonate-Vitamin D (CALTRATE 600+D PO)         cetirizine (ZYRTEC) 10 MG tablet         Cholecalciferol (VITAMIN D3) 5000 units CAPS         flecainide (TAMBOCOR) 100 MG tablet         fluticasone (FLONASE) 50 MCG/ACT nasal spray         ibuprofen (ADVIL,MOTRIN) 200 MG tablet         letrozole (FEMARA) 2.5 MG tablet         Meth-Hyo-M Bl-Na Phos-Ph Sal (URIBEL) 118 MG CAPS         metoprolol succinate (TOPROL-XL) 50 MG 24 hr tablet         oxyCODONE-acetaminophen (PERCOCET/ROXICET) 5-325 MG tablet         PARoxetine (PAXIL) 20 MG tablet         rosuvastatin (CRESTOR) 5 MG tablet      Medications self-administered by patient taken the night of the study : N/A  SLEEP ARCHITECTURE The study was initiated at 10:37:10 PM and ended at 5:18:39 AM.  Sleep onset time was 113.8 minutes and the sleep efficiency was 50.9%%. The total sleep time was 204.5  minutes. Wake after sleep onset (WASO) was 83.2 minutes  Stage REM latency was N/A minutes.  The patient spent 16.9%% of the night in stage N1 sleep, 83.1%% in stage N2 sleep, 0.0%% in stage N3 and 0.00% in REM.  Alpha intrusion was absent.  Supine sleep was 42.30%.  RESPIRATORY PARAMETERS The overall apnea/hypopnea index (AHI) was 0.3 per hour.  The respiratory disturbance index (RDI) was 12.6/h. There were 1 total apneas, including 1 obstructive, 0 central and 0 mixed apneas. There were 0 hypopneas and 42 RERAs.  The AHI during Stage REM sleep was N/A per hour.  AHI while supine was 0.0 per hour.  The mean oxygen saturation was 95.2%. The minimum SpO2 during sleep was 92.0%.  soft snoring was noted during this study.  CARDIAC DATA The 2 lead EKG demonstrated sinus rhythm, atrial fibrillation. The mean heart rate was 53.0 beats per minute. Other EKG findings include: None.  LEG MOVEMENT DATA The total PLMS were 0 with a resulting PLMS index of 0.0. Associated arousal with leg movement index was 0.0 .  IMPRESSIONS - No significant obstructive sleep apnea (AHI 0.3/h; however, RDI was increased at 12.6/h). The potential severity of sleep disordered breathing may be underestimated since there was  absence of REM sleep on this study. - No significant central sleep apnea occurred during this study (CAI = 0.0/h). - No significant oxygen desaturation during the study (Min O2 92.0%). - Reduced sleep effiiciency at 50.9%. - The arousas index was increased.  - The patient snored with soft snoring volume. - No cardiac abnormalities were noted during this study. - Clinically significant periodic limb movements did not occur during sleep. No significant associated arousals.  DIAGNOSIS - Nocturnal Hypoxemia (327.26 [G47.36 ICD-10]) - Excessive Daytime Sleepiness  RECOMMENDATIONS - Efforts should be made to optimize nasal and oropharyngeal patency.  - REM supression may be contributed by  the patient's pharmocotherapy.  - Consider a f/u evaluation in the future if symptoms progress. - Avoid alcohol, sedatives and other CNS depressants that may worsen sleep apnea and disrupt normal sleep architecture. - Sleep hygiene should be reviewed to assess factors that may improve sleep quality. - Weight management and regular exercise should be initiated or continued if appropriate.  [Electronically signed] 11/29/2017 03:17 PM  Shelva Majestic MD, [facility], ABSM Diplomate, American Board of Sleep Medicine   NPI: [id number]  [place] PH: [phone]   FX: [phone] [place]

## 2017-12-01 ENCOUNTER — Telehealth: Payer: Self-pay | Admitting: *Deleted

## 2017-12-01 NOTE — Progress Notes (Signed)
Patient notified of results.

## 2017-12-01 NOTE — Telephone Encounter (Signed)
Patient notified sleep study shows no OSA. Patient voiced verbal understanding asking when her next appointment is with Dr Claiborne Billings. Information provided to her.

## 2017-12-08 ENCOUNTER — Ambulatory Visit: Payer: Medicare Other | Admitting: Physician Assistant

## 2017-12-12 ENCOUNTER — Ambulatory Visit (INDEPENDENT_AMBULATORY_CARE_PROVIDER_SITE_OTHER): Payer: Medicare Other | Admitting: Physician Assistant

## 2017-12-12 ENCOUNTER — Encounter: Payer: Self-pay | Admitting: Physician Assistant

## 2017-12-12 ENCOUNTER — Other Ambulatory Visit: Payer: Self-pay

## 2017-12-12 VITALS — BP 126/62 | HR 65 | Temp 99.2°F | Resp 18 | Ht 64.0 in | Wt 161.4 lb

## 2017-12-12 DIAGNOSIS — J309 Allergic rhinitis, unspecified: Secondary | ICD-10-CM

## 2017-12-12 DIAGNOSIS — R21 Rash and other nonspecific skin eruption: Secondary | ICD-10-CM | POA: Diagnosis not present

## 2017-12-12 DIAGNOSIS — E663 Overweight: Secondary | ICD-10-CM

## 2017-12-12 DIAGNOSIS — Z23 Encounter for immunization: Secondary | ICD-10-CM | POA: Diagnosis not present

## 2017-12-12 DIAGNOSIS — Z9189 Other specified personal risk factors, not elsewhere classified: Secondary | ICD-10-CM | POA: Diagnosis not present

## 2017-12-12 MED ORDER — ZOSTER VAC RECOMB ADJUVANTED 50 MCG/0.5ML IM SUSR: 0.5000 mL | Freq: Once | INTRAMUSCULAR | 0 refills | Status: AC

## 2017-12-12 MED ORDER — FLUTICASONE PROPIONATE 50 MCG/ACT NA SUSP: 2.0000 | Freq: Every day | NASAL | 3 refills | Status: AC

## 2017-12-12 MED ORDER — CLOTRIMAZOLE 1 % EX CREA: 1.0000 "application " | TOPICAL_CREAM | Freq: Two times a day (BID) | CUTANEOUS | 0 refills | Status: DC

## 2017-12-12 MED ORDER — ZOSTER VAC RECOMB ADJUVANTED 50 MCG/0.5ML IM SUSR: 0.5000 mL | Freq: Once | INTRAMUSCULAR | 0 refills | Status: DC

## 2017-12-12 NOTE — Patient Instructions (Addendum)
For rash, I recommend trying just clotrimazole cream (which I sent to your pharmacy), twice daily for the next 2-4 weeks. You may notice more improvement with this compared to lotrisone. However, if it starts to become more itchy, you can switch back to lotrisone. I also recommend d/c use of new lotion and soap for the next couple weeks to see if this helps in case it is a dermatitis to new products.    Your hepatitis screen was completed on 05/15/2014 and was negative: below are the results.    Ref Range & Units 56yr ago  Hepatitis B Surface Ag NEGATIVE NEGATIVE   HCV Ab NEGATIVE NEGATIVE   Hep A IgM NON REACTIVE NON REACTIVE   Comment: (NOTE)  Effective May 20, 2014, Hepatitis Acute Panel (test code [id number])  will be revised to automatically reflex to the Hepatitis C Viral RNA,  Quantitative, Real-Time PCR assay if the Hepatitis C antibody  screening result is Reactive. This action is being taken to ensure  that the CDC/USPSTF recommended HCV diagnostic algorithm with the  appropriate test reflex needed for accurate interpretation is  followed.   Hep B C IgM NON REACTIVE NON REACTIVE   Comment: (NOTE)  High levels of Hepatitis B Core IgM antibody are detectable  during the acute stage of Hepatitis B. This antibody is used  to differentiate current from past HBV infection.  Performed at [facility]       I have printed an Rx for shingrix, please complete the second dose.   For cholesterol, try to consume a diet that emphasizes intake of vegetables, fruits, and whole grains; including low-fat dairy, poultry, fish (like salmon and tuna), beans, olive oil, and nuts. Limit the amount of sweet, sugar, and red meat in your diet. Continue increasing exercise as tolerated. We calculated your risk of developing an adverse event such as a heart attack or stroke in the next ten years and yours is 10.6%. Remember, you cannot change your age, race, or gender but hopefully it can be  lowered with diet, exercise, and low dose crestor.   Follow up as needed. Thank you for letting me participate in your health and well being.     For future referene, below is some info on PREVENTING RINGWORM To prevent ringworm and other skin infections: ? Do not share clothing, sports equipment, or towels with other people.  ? When at the gym, local pool, or other public areas (including the shower), always wear slippers or sandals.  ? Wash thoroughly with soap and shampoo after any sport involving skin-to-skin contact.  ? Avoid tight-fitting clothing. Change your socks and underwear at least once a day.  ? Keep your skin clean and dry. Always dry yourself completely after bathing.  ? If you have athlete's foot, put your socks on before your underwear so that the infection does not spread to other parts of your body.  ? Take your pet to the vet if it has patches of missing hair or a rash. That could be a sign of a tinea infection.  ? If you or someone in your family has symptoms of ringworm, make sure s/he is treated right away. Otherwise, the infection may spread.        IF you received an x-ray today, you will receive an invoice from [facility]. Please contact [facility] at [phone] with questions or concerns regarding your invoice.   IF you received labwork today, you will receive an invoice from [facility].  Please contact [facility] at [phone] with questions or concerns regarding your invoice.   Our billing staff will not be able to assist you with questions regarding bills from these companies.  You will be contacted with the lab results as soon as they are available. The fastest way to get your results is to activate your My Chart account. Instructions are located on the last page of this paperwork. If you have not heard from [place] regarding the results in 2 weeks, please contact this office.

## 2017-12-12 NOTE — Progress Notes (Signed)
Keyon Winnick  MRN: [id number] DOB: 02-Mar-1951  Subjective:  Tami [name] is a 67 y.o. female seen in office today for a chief complaint of f/u on results and rash involving the bilateral thighs. Rash started on inner left thing 4 days ago, started out as oval with red raised border.  Then noticed some involvement of right inner thigh.  Rash causes no discomfort. Associated symptoms: none. Patient denies: abdominal pain, congestion, crankiness, decrease in energy level, fever, headache, irritability, nausea, sore throat and vomiting. Patient has not had contacts with similar rash. Patient has had new exposures to lotion and soap over the past week. No new exposures to laundry detergents, foods, medications, plants, insects or animals. Has tried lotrisone with relief and improvement in appearance.   Would like to discuss labs work from cardiologist.  Her cholesterol is elevated in 10/2017.  Was started on Crestor 5 mg daily by her cardiologist.  Notes she has had multiple statins in the past and always seems to have muscle aches and joint aches whenever she is on them.  She has recently been seen by physical therapist and neurologist for muscle weakness and pain.  She has experienced much improvement.  She feels much stronger and is able to start exercising now.  Starting Crestor 5 mg, she has noticed some muscle aches return in her leg.  It is tolerable at this time.  In terms of diet, she is eating a heart healthy diet.  She eats mostly [place], chicken, fish, vegetables, and fruits.  She does drink coffee occasionally.  Drinks water as well.  Denies smoking.  Also needs Td vaccine and second Shingrix vaccine.  Would also like refill for Flonase for allergies. Her allergies are very controlled on Zyrtec and Flonase daily.     Review of Systems  Per HPI  Patient Active Problem List   Diagnosis Date Noted  . Pelvic and perineal pain 08/19/2017  . Right hip pain 07/26/2017  . Weakness  07/26/2017  . Gait abnormality 07/26/2017  . Depression 04/23/2016  . Nausea without vomiting 04/23/2016  . Trigger finger, acquired 03/26/2016  . Swelling of both ankles 12/18/2015  . First degree heart block 12/18/2015  . Abdominal pain   . Abnormal LFTs   . Oral candidiasis 05/14/2014  . Dehydration 05/14/2014  . Generalized pain 05/14/2014  . PAF (paroxysmal atrial fibrillation) ([place]) 05/14/2014  . PUD (peptic ulcer disease) 05/14/2014  . Transaminitis 05/14/2014  . Normocytic anemia 05/14/2014  . Epigastric abdominal pain 05/14/2014  . Family history of malignant neoplasm of breast   . Breast cancer of lower-inner quadrant of right female breast ([place]) 02/14/2014  . Hyperlipidemia 05/23/2013  . Caregiver stress 10/23/2012  . Osteoporosis, unspecified 08/24/2012  . DDD (degenerative disc disease), lumbar 08/23/2012  . Female stress incontinence 05/28/2012  . Hydronephrosis 05/28/2012  . Numbness and tingling of right arm and leg 08/23/2011  . Essential hypertension 09/14/2010  . Abdominal pain, epigastric 09/14/2010  . IRRITABLE BOWEL SYNDROME 12/29/2009  . OSTEOARTHRITIS 12/29/2009  . 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 PSVT 07/11/2008  . GERD 06/24/2000  . CANDIDIASIS, ESOPHAGEAL 04/19/1995  . ASTHMA UNSPECIFIED WITH EXACERBATION 04/19/1995    Current Outpatient Medications on File Prior to Visit  Medication Sig Dispense Refill  . albuterol (PROVENTIL HFA;VENTOLIN HFA) 108 (90 Base) MCG/ACT inhaler Inhale 2 puffs into the lungs every 4 (four) hours as needed (cough, shortness of breath or wheezing.). 1 Inhaler 3  . amitriptyline (ELAVIL) 25 MG tablet Take  25 mg by mouth at bedtime as needed for sleep. When patient has migraines    . aspirin EC 81 MG tablet Take 81 mg by mouth daily.    . Calcium Carbonate-Vitamin D (CALTRATE 600+D PO) Take by mouth.    . cetirizine (ZYRTEC) 10 MG tablet Take 10 mg by mouth daily.    . Cholecalciferol (VITAMIN D3) 5000 units CAPS  Take 5,000 Units by mouth 2 (two) times daily.     . flecainide (TAMBOCOR) 100 MG tablet TAKE ONE-HALF TABLET BY MOUTH TWICE DAILY 90 tablet 2  . fluticasone (FLONASE) 50 MCG/ACT nasal spray Place 2 sprays into both nostrils daily. 16 g 3  . ibuprofen (ADVIL,MOTRIN) 200 MG tablet Take 200 mg by mouth every 6 (six) hours as needed for headache or moderate pain.    . Meth-Hyo-M Bl-Na Phos-Ph Sal (URIBEL) 118 MG CAPS Take 118 mg by mouth 4 (four) times daily as needed (for bladder). Reported on 09/09/2015  2  . metoprolol succinate (TOPROL-XL) 50 MG 24 hr tablet TAKE 1 TABLET BY MOUTH DAILY. TAKE WITH OR IMMEDIATELY FOLLOWING A MEAL. 90 tablet 2  . oxyCODONE-acetaminophen (PERCOCET/ROXICET) 5-325 MG tablet Take 1 tablet by mouth every 6 (six) hours as needed for severe pain. 60 tablet 0  . PARoxetine (PAXIL) 20 MG tablet Take 1 tablet (20 mg total) by mouth daily. 90 tablet 1  . rosuvastatin (CRESTOR) 5 MG tablet Take 1 tablet (5 mg total) by mouth daily. 90 tablet 3  . letrozole (FEMARA) 2.5 MG tablet Take 1 tablet (2.5 mg total) by mouth daily. (Patient not taking: Reported on 10/06/2017) 90 tablet 4   Current Facility-Administered Medications on File Prior to Visit  Medication Dose Route Frequency Provider Last Rate Last Dose  . 0.9 %  sodium chloride infusion  500 mL Intravenous Continuous Irene Shipper, MD        Allergies  Allergen Reactions  . Latex Swelling and Rash    Skin swelling   . Sulfonamide Derivatives Anaphylaxis, Shortness Of Breath, Nausea Only and Other (See Comments)    Sweating, headache   . Clarithromycin Nausea Only and Other (See Comments)    Headache   . Crestor [Rosuvastatin] Other (See Comments)    Muscle aches, pain, weakness in lower extremities  . Livalo [Pitavastatin] Other (See Comments)    Leg pain  . Statins Other (See Comments)    Muscle soreness and weakness w/ Rosuvastatin and Zetia.  . Ciprofloxacin Hcl Nausea Only, Palpitations and Other (See Comments)      Headache      Objective:  BP 126/62   Pulse 65   Temp 99.2 F (37.3 C) (Oral)   Resp 18   Ht 5\' 4"  (1.626 m)   Wt 161 lb 6.4 oz (73.2 kg)   LMP 07/06/1985 (Approximate)   SpO2 97%   BMI 27.70 kg/m   Physical Exam  Constitutional: She is oriented to person, place, and time. She appears well-developed and well-nourished. No distress.  HENT:  Head: Normocephalic and atraumatic.  Eyes: Conjunctivae are normal.  Neck: Normal range of motion.  Pulmonary/Chest: Effort normal.  Neurological: She is alert and oriented to person, place, and time.  Skin: Skin is warm and dry. Rash (mildly erythamatous oval plaque on medial aspect of left thigh, small erythematous streak noted on right medial thigh, no TTP. ) noted.  Psychiatric: She has a normal mood and affect.  Vitals reviewed.  BP Readings from Last 3 Encounters:  12/12/17 126/62  10/06/17 134/78  09/29/17 (!) 149/83   Wt Readings from Last 3 Encounters:  12/12/17 161 lb 6.4 oz (73.2 kg)  11/06/17 160 lb (72.6 kg)  10/06/17 160 lb (72.6 kg)     Assessment and Plan :  1. Rash and nonspecific skin eruption Pt's description of what rash looked like before she started lotrisone seems highly suspicious for ringworm. It has improved in appearance with use of lotrisone. Since she is not having pruritis, recommend lotrimin cream BID. Also, rec avoiding use of new products in case this is not ringworm but instead allergic contact dermatitis. Return to clinic if symptoms worsen, do not improve, or as needed.  - clotrimazole (LOTRIMIN) 1 % cream; Apply 1 application topically 2 (two) times daily.  Dispense: 30 g; Refill: 0  2. Allergic rhinitis, unspecified seasonality, unspecified trigger - fluticasone (FLONASE) 50 MCG/ACT nasal spray; Place 2 sprays into both nostrils daily.  Dispense: 16 g; Refill: 3  3. 10 year risk of MI or stroke 7.5% or greater Calculated 10 year risk of ASCVD risk, it is 10.6%. Discussed lifestyle  modifications that can aid in lowering cholesterol. Pt follows a heart healthy diet, which is good. Discussed aspects of diet that she can change to help lower cholesterol. Rec increase exercise as tolerated. Continue crestor 5 mg as tolerated. Plan to repeat lipid panel in 6 mths.   4. Overweight, BMI 25.0-29.9 Discussed diet and exercise. Pt is motivated to continue exercising as tolerated.   5. Need for Td vaccine - Td : Tetanus/diphtheria >7yo Preservative  free  6. Need for shingles vaccine - Zoster Vaccine Adjuvanted [facility]) injection; Inject 0.5 mLs into the muscle once for 1 dose.  Dispense: 0.5 mL; Refill: 0  A total of 25 minutes was spent in the room with the patient, greater than 50% of which was in counseling/coordination of care regarding rash, overweight, cholesterol levels, and ASCVD 10 year risk.   Tenna Delaine PA-C  [facility] 12/12/2017 10:52 AM

## 2017-12-29 ENCOUNTER — Inpatient Hospital Stay: Payer: Medicare Other | Attending: Hematology and Oncology | Admitting: Hematology and Oncology

## 2017-12-29 ENCOUNTER — Telehealth: Payer: Self-pay | Admitting: Hematology and Oncology

## 2017-12-29 DIAGNOSIS — Z170 Estrogen receptor positive status [ER+]: Secondary | ICD-10-CM | POA: Diagnosis not present

## 2017-12-29 DIAGNOSIS — C50311 Malignant neoplasm of lower-inner quadrant of right female breast: Secondary | ICD-10-CM | POA: Diagnosis not present

## 2017-12-29 DIAGNOSIS — Z79811 Long term (current) use of aromatase inhibitors: Secondary | ICD-10-CM | POA: Insufficient documentation

## 2017-12-29 NOTE — Assessment & Plan Note (Signed)
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 status post lumpectomy, Oncotype DX recurrence score 29 intermediate risk 19% risk of recurrence with hormonal therapy alone  Chemotherapy summary: Taxotere and Cytoxan 4 started 05/07/2014 completed 07/09/2014 Long-term chemotherapy toxicities: Neuropathy Radiation therapy: Started 08/07/2014 completed 09/16/14 ---------------------------------------------------------------------------- Current treatment: Anastrozole 1 mg daily started 10/04/14 switched to letrozole 09/16/2015 Letrozole Toxicities: 1. Depression: we switched her treatment from Paxil to Effexor.  2. Hot flashes: Much better on letrozole.  Cognitive dysfunction:  Breast Cancer Surveillance: 1. Breast exam 09/29/2017: Normal 2. Mammogram 9/7/2018n abnormalities. Postsurgical changes. Breast Density Category C.  08/24/2017 MRI pelvis: 7 mm enhancing lesion superior left pubic ramus 09/15/2017: Bone scan: No evidence of metastatic disease 08/09/2017: MRI brain: No metastases, mild chronic small vessel ischemic disease  Pelvic pain: Extensive evaluation did not show any ear etiology.  We switched her from anastrozole to letrozole to see if it makes a difference. 

## 2017-12-29 NOTE — Progress Notes (Signed)
Patient Care Team: Donzetta Kohut as PCP - General (Physician Assistant) Terance Ice, MD (Cardiology) Carolan Clines, MD as Attending Physician (Urology) Azucena Fallen, MD as Consulting Physician (Obstetrics and Gynecology) Nicholas Lose, MD as Consulting Physician (Hematology and Oncology)  DIAGNOSIS:  Encounter Diagnosis  Name Primary?  . Malignant neoplasm of lower-inner quadrant of right breast of female, estrogen receptor positive ([place])     SUMMARY OF ONCOLOGIC HISTORY:   Breast cancer of lower-inner quadrant of right female breast ([place])   01/18/2014 Mammogram    U/S Breast: 7 x 7 x 9 mm irregular taller than wide hypoechoic mass with a peripheral echogenic rim in the right breast 4 o'clock position 7 cm from the nipple. No right axillary lymphadenopathy.       02/14/2014 Initial Diagnosis    Breast cancer-right Er 100%, PR 94%, Ki 67: 17%; Her2 Neg ratio 1.74; T1bN0Mo (Clinical Stage 1A)      04/02/2014 Surgery    Right breast lumpectomy: Invasive ductal carcinoma negative for lymph vascular invasion, 1.6 cm, grade 2, 3 SLN negative, reexcision margins benign, ER 100%, PR 94%, HER-2 negative Ki-67 70% T1 C. N0 M0 stage IA: Oncotype DX 29 (19%ROR)      05/07/2014 - 07/09/2014 Chemotherapy    Taxotere Cytoxan adjuvant chemotherapy every 3 weeks x4 cycles (after cycle one hospitalization for pain related to Neulasta and severe gastritis due to steroids)      08/07/2014 - 09/16/2014 Radiation Therapy    Adjuvant radiation therapy      10/04/2014 -  Anti-estrogen oral therapy    Anastrozole 1 mg daily changed to letrozole 09/16/2015 due to hot flashes       CHIEF COMPLIANT: Follow-up on letrozole therapy  INTERVAL HISTORY: Tami [name] is a 67 year old with above-mentioned history of right breast cancer treated with lumpectomy followed by adjuvant chemotherapy and radiation.  She initially took anastrozole and that later changed to letrozole but either  of these medicines had caused her diffuse body aches and pains as well as pelvic pain.  Once she stopped letrozole the pain had subsided.  She was feeling tremendously well.  Recently she was started on a cholesterol pill and since then she is now experiencing diffuse muscle and joint pains.  She is planning to call her physician to stop the cholesterol medication. She is here for a 7-monthfollow-up. She tells me she is not ready to try any other antiestrogen therapies at this time.  REVIEW OF SYSTEMS:   Constitutional: Diffuse muscle and joint pains Eyes: Denies blurriness of vision Ears, nose, mouth, throat, and face: Denies mucositis or sore throat Respiratory: Denies cough, dyspnea or wheezes Cardiovascular: Denies palpitation, chest discomfort Gastrointestinal:  Denies nausea, heartburn or change in bowel habits Skin: Denies abnormal skin rashes Lymphatics: Denies new lymphadenopathy or easy bruising Neurological:Denies numbness, tingling or new weaknesses, complaining of pelvic pain.  Pelvic MRI was negative. Behavioral/Psych: Mood is stable, no new changes  Extremities: Stiffness and aches and pains Breast:  denies any pain or lumps or nodules in either breasts All other systems were reviewed with the patient and are negative.  I have reviewed the past medical history, past surgical history, social history and family history with the patient and they are unchanged from previous note.  ALLERGIES:  is allergic to latex; sulfonamide derivatives; clarithromycin; crestor [rosuvastatin]; livalo [pitavastatin]; statins; and ciprofloxacin hcl.  MEDICATIONS:  Current Outpatient Medications  Medication Sig Dispense Refill  . albuterol (PROVENTIL HFA;VENTOLIN HFA) 108 (90 Base)  MCG/ACT inhaler Inhale 2 puffs into the lungs every 4 (four) hours as needed (cough, shortness of breath or wheezing.). 1 Inhaler 3  . amitriptyline (ELAVIL) 25 MG tablet Take 25 mg by mouth at bedtime as needed for  sleep. When patient has migraines    . aspirin EC 81 MG tablet Take 81 mg by mouth daily.    . Calcium Carbonate-Vitamin D (CALTRATE 600+D PO) Take by mouth.    . cetirizine (ZYRTEC) 10 MG tablet Take 10 mg by mouth daily.    . Cholecalciferol (VITAMIN D3) 5000 units CAPS Take 5,000 Units by mouth 2 (two) times daily.     . flecainide (TAMBOCOR) 100 MG tablet TAKE ONE-HALF TABLET BY MOUTH TWICE DAILY 90 tablet 2  . fluticasone (FLONASE) 50 MCG/ACT nasal spray Place 2 sprays into both nostrils daily. 16 g 3  . ibuprofen (ADVIL,MOTRIN) 200 MG tablet Take 200 mg by mouth every 6 (six) hours as needed for headache or moderate pain.    . Meth-Hyo-M Bl-Na Phos-Ph Sal (URIBEL) 118 MG CAPS Take 118 mg by mouth 4 (four) times daily as needed (for bladder). Reported on 09/09/2015  2  . metoprolol succinate (TOPROL-XL) 50 MG 24 hr tablet TAKE 1 TABLET BY MOUTH DAILY. TAKE WITH OR IMMEDIATELY FOLLOWING A MEAL. 90 tablet 2  . PARoxetine (PAXIL) 20 MG tablet Take 1 tablet (20 mg total) by mouth daily. 90 tablet 1  . rosuvastatin (CRESTOR) 5 MG tablet Take 1 tablet (5 mg total) by mouth daily. 90 tablet 3   Current Facility-Administered Medications  Medication Dose Route Frequency Provider Last Rate Last Dose  . 0.9 %  sodium chloride infusion  500 mL Intravenous Continuous Irene Shipper, MD        PHYSICAL EXAMINATION: ECOG PERFORMANCE STATUS: 1 - Symptomatic but completely ambulatory  Vitals:   12/29/17 1205  BP: 135/62  Pulse: (!) 58  Resp: 18  Temp: 98.5 F (36.9 C)  SpO2: 100%   Filed Weights   12/29/17 1205  Weight: 163 lb 9.6 oz (74.2 kg)    GENERAL:alert, no distress and comfortable SKIN: skin color, texture, turgor are normal, no rashes or significant lesions EYES: normal, Conjunctiva are pink and non-injected, sclera clear OROPHARYNX:no exudate, no erythema and lips, buccal mucosa, and tongue normal  NECK: supple, thyroid normal size, non-tender, without nodularity LYMPH:  no  palpable lymphadenopathy in the cervical, axillary or inguinal LUNGS: clear to auscultation and percussion with normal breathing effort HEART: regular rate & rhythm and no murmurs and no lower extremity edema ABDOMEN:abdomen soft, non-tender and normal bowel sounds MUSCULOSKELETAL:no cyanosis of digits and no clubbing  NEURO: alert & oriented x 3 with fluent speech, no focal motor/sensory deficits EXTREMITIES: No lower extremity edema  LABORATORY DATA:  I have reviewed the data as listed CMP Latest Ref Rng & Units 10/12/2017 07/28/2016 04/22/2016  Glucose 65 - 99 mg/dL 101(H) 95 82  BUN 8 - 27 mg/dL 10 10 6.4(L)  Creatinine 0.57 - 1.00 mg/dL 0.88 0.86 0.9  Sodium 134 - 144 mmol/L 142 142 142  Potassium 3.5 - 5.2 mmol/L 4.1 4.5 3.8  Chloride 96 - 106 mmol/L 105 107 -  CO2 20 - 29 mmol/L _0 Calcium 8.7 - 10.3 mg/dL 9.7 9.5 9.8  Total Protein 6.0 - 8.5 g/dL 7.2 6.9 7.9  Total Bilirubin 0.0 - 1.2 mg/dL 0.4 0.4 0.38  Alkaline Phos 39 - 117 IU/L 115 86 107  AST 0 - 40 IU/L 30  20 18  ALT 0 - 32 IU/L 33(H) 16 12    Lab Results  Component Value Date   WBC 3.4 10/12/2017   HGB 13.1 10/12/2017   HCT 37.9 10/12/2017   MCV 93 10/12/2017   PLT 243 10/12/2017   NEUTROABS 2.5 05/09/2017    ASSESSMENT & PLAN:  Breast cancer of lower-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 status post lumpectomy, Oncotype DX recurrence score 29 intermediate risk 19% risk of recurrence with hormonal therapy alone  Chemotherapy summary: Taxotere and Cytoxan 4 started 05/07/2014 completed 07/09/2014 Long-term chemotherapy toxicities: Neuropathy Radiation therapy: Started 08/07/2014 completed 09/16/14 ---------------------------------------------------------------------------- Current treatment: Anastrozole 1 mg daily started 10/04/14 switched to letrozole 09/16/2015 Letrozole Toxicities: 1.  Depression: we switched her treatment from Paxil to Effexor.  2. Hot flashes: 3 diffuse muscle aches and pains.  Patient cannot tolerate either anastrozole or letrozole. I discontinue letrozole today. I discussed with her that when she is ready for attempting another antiestrogen therapy, we can try exemestane.  If it is not well covered by her insurance and we can try tamoxifen. She will call us when she is ready for trying a new medication.  At this time because of the muscle aches and pains from cholesterol medicine she is not able to initiate/resume antiestrogen therapy. We can call in for the prescription once she lets [place] know that she is ready to restart antiestrogens  Cognitive dysfunction:  Breast Cancer Surveillance: 1. Breast exam 09/29/2017: Normal 2. Mammogram 9/7/2018n abnormalities. Postsurgical changes. Breast Density Category C.  08/24/2017 MRI pelvis: 7 mm enhancing lesion superior left pubic ramus 09/15/2017: Bone scan: No evidence of metastatic disease 08/09/2017: MRI brain: No metastases, mild chronic small vessel ischemic disease  Return to clinic in 6 months for follow-up No orders of the defined types were placed in this encounter.  The patient has a good understanding of the overall plan. she agrees with it. she will call with any problems that may develop before the next visit here.   Harriette Ohara, MD 12/29/17

## 2017-12-29 NOTE — Telephone Encounter (Signed)
Gave avs and calendar ° °

## 2018-01-03 ENCOUNTER — Other Ambulatory Visit: Payer: Self-pay | Admitting: *Deleted

## 2018-01-03 DIAGNOSIS — Z79899 Other long term (current) drug therapy: Secondary | ICD-10-CM

## 2018-01-03 DIAGNOSIS — E7849 Other hyperlipidemia: Secondary | ICD-10-CM

## 2018-01-10 ENCOUNTER — Other Ambulatory Visit: Payer: Self-pay | Admitting: Cardiovascular Disease

## 2018-01-10 NOTE — Telephone Encounter (Signed)
Rx sent to pharmacy   

## 2018-01-25 LAB — COMPREHENSIVE METABOLIC PANEL
A/G RATIO: 1.6 (ref 1.2–2.2)
ALBUMIN: 4.5 g/dL (ref 3.6–4.8)
ALT: 17 IU/L (ref 0–32)
AST: 21 IU/L (ref 0–40)
Alkaline Phosphatase: 110 IU/L (ref 39–117)
BUN / CREAT RATIO: 9 — AB (ref 12–28)
BUN: 8 mg/dL (ref 8–27)
Bilirubin Total: 0.4 mg/dL (ref 0.0–1.2)
CALCIUM: 9.6 mg/dL (ref 8.7–10.3)
CO2: 22 mmol/L (ref 20–29)
Chloride: 104 mmol/L (ref 96–106)
Creatinine, Ser: 0.89 mg/dL (ref 0.57–1.00)
GFR calc Af Amer: 78 mL/min/{1.73_m2} (ref >59)
GFR calc non Af Amer: 67 mL/min/{1.73_m2} (ref >59)
GLOBULIN, TOTAL: 2.8 g/dL (ref 1.5–4.5)
Glucose: 98 mg/dL (ref 65–99)
POTASSIUM: 4.3 mmol/L (ref 3.5–5.2)
SODIUM: 142 mmol/L (ref 134–144)
Total Protein: 7.3 g/dL (ref 6.0–8.5)

## 2018-01-25 LAB — LIPID PANEL
CHOLESTEROL TOTAL: 172 mg/dL (ref 100–199)
Chol/HDL Ratio: 2.4 ratio (ref 0.0–4.4)
HDL: 71 mg/dL (ref >39)
LDL CALC: 80 mg/dL (ref 0–99)
TRIGLYCERIDES: 106 mg/dL (ref 0–149)
VLDL CHOLESTEROL CAL: 21 mg/dL (ref 5–40)

## 2018-02-06 ENCOUNTER — Ambulatory Visit (INDEPENDENT_AMBULATORY_CARE_PROVIDER_SITE_OTHER): Payer: Medicare Other | Admitting: Cardiovascular Disease

## 2018-02-06 ENCOUNTER — Encounter: Payer: Self-pay | Admitting: Cardiovascular Disease

## 2018-02-06 VITALS — BP 124/70 | HR 60 | Ht 64.0 in | Wt 162.8 lb

## 2018-02-06 DIAGNOSIS — G478 Other sleep disorders: Secondary | ICD-10-CM

## 2018-02-06 DIAGNOSIS — E7849 Other hyperlipidemia: Secondary | ICD-10-CM | POA: Diagnosis not present

## 2018-02-06 DIAGNOSIS — I471 Supraventricular tachycardia: Secondary | ICD-10-CM | POA: Diagnosis not present

## 2018-02-06 DIAGNOSIS — I10 Essential (primary) hypertension: Secondary | ICD-10-CM | POA: Diagnosis not present

## 2018-02-06 DIAGNOSIS — I440 Atrioventricular block, first degree: Secondary | ICD-10-CM

## 2018-02-06 NOTE — Progress Notes (Signed)
Patient ID: Konya Fauble, female   DOB: 1951-03-12, 67 y.o.   MRN: [id number]    HPI: Ms. Ronnika Collett is a 67 year old African American female who is a former patient of Dr. Rollene Fare. She presents for a 4 month cardiology evaluation.  Ms. Andringa has a history of palpitations, paroxysmal supraventricular tachycardia as well as paroxysmal atrial fibrillation which have been detected on remote monitoring. She has been treated with low-dose flecainide 50 mg twice a day in addition to metoprolol succinate 50 mg daily. She also has a history of hyperlipidemia in the past has had LDLs in the 145 range but is intolerant to several statins including Crestor. She also states she developed some myalgias secondary to Zetia. When she was last seen by Dr. Rollene Fare he had written her a prescription for livalo 1 mg but she never took this.  She also has continued problems with low back and hip discomfort and has some sciatica issues. She is followed by Dr. Mayer Camel. She also has undergone MRIs of her back.  An echo Doppler study in February 2012 showed normal systolic and diastolic function. She did not have significant valvular pathology. A nuclear perfusion study in 2010 which was normal.  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  I saw her prior to undergoing her lumpectomy and prior to radiation and chemotherapy.  A subsequent 2-D echo Doppler study on 03/28/2014 showed an ejection fraction at 60-65% and was normal.  She tolerated lumpectomy well and completed chemotherapy and radiation and  is on letrozole for planned 7 years.   I had not seen her since 2017 and last saw her in April 2019.  He done well from a cardiac standpoint and denied any chest pain, palpitations, or palpitations.  I recently saw her,  she was experiencing poor sleep, admitted to snoring and had frequent nocturia.  She was going to bed at 9:30 PM and waking up at 7:30 AM.  She continued to be on flecainide 50 mg twice a day in addition to Toprol-XL 50 mg without recurrent arrhythmia.  Is a history of anxiety and has been [place] a trip to [place] in addition to fluoxetine.  Because of her poor sleep, I referred her for a sleep study.  On Nov 06, 2017 she underwent a diagnostic polysomnogram.  This was notable in that she did not have any significant obstructive sleep apnea overall.  Her AHI was 0.3/h.  However, she had increased arousals and her RDI was increased to 12.6.  She was unable to achieve any REM sleep and it was possible that potential of the severity of sleep disordered breathing may have been underestimated due to the absence of rem sleep on this study.  She did not have any significant oxygen desaturation with a nadir of 92%.  There was markedly reduced sleep efficiency at 50.9%.  There was soft snoring.  Did not have clinically significant periodic limb movements during sleep.    Past Medical History:  Diagnosis Date  . Anemia    as a child  . Anxiety   . Arthritis   . Asthma   . Atrial fibrillation ([place])   . Breast cancer ([place]) 2015  . Depression with anxiety   . Dysrhythmia    hx AF  . Family history of malignant neoplasm of breast   .  Fibromyalgia   . Heart palpitations   . Heartburn   . Hyperlipidemia   . Invasive ductal carcinoma of right breast ([place]) 02/08/14   [place]  . Irregular heartbeat   . Malignant neoplasm of breast (female), unspecified site   . Migraine   . Numbness of arm    Right Arm  . Osteoporosis   . Personal history of chemotherapy 2015   Right Breast Cancer  . Personal history of radiation therapy 2015   Right Breast Cancer  . Pure hypercholesterolemia   . SVT (supraventricular tachycardia) ([facility])     Past Surgical History:  Procedure Laterality Date  . 30 Day Monitor   04/10/2009 to 05/23/2009   No End Of Summary Report Atrial Fib,Narrow Complex Tachycardia,Sinus Rhythm   . ABDOMINAL HYSTERECTOMY  1987  . BREAST LUMPECTOMY Right 2015  . BREAST LUMPECTOMY WITH NEEDLE LOCALIZATION AND AXILLARY SENTINEL LYMPH NODE BX Right 04/02/2014   Procedure: Right Axillary Lymphatic Mapping; Right axillary Sentinal Node Biopsy; Right Partial Mastectomy after wire localization;  Surgeon: Jackolyn Confer, MD;  Location: [facility];  Service: General;  Laterality: Right;  . BREAST SURGERY     Left  . CYSTOSCOPY  04/25/2012   Pt may be a candidate for research anti-incontinence procedure protocol  . Dipyridamole Myoview  05/27/2009   The post stress myocardial perfusion images show a normal pattern of perfusion in all regions.The post stress ejection fraction is 83 %. Normal  myocardial perfusion imaging This is a low risk scan.  . DOPPLER ECHOCARDIOGRAPHY  08/21/2010   Ejecion Fraction =>55% All chambers are normal in size and function No significant change from 2010.  [name] lower Venous Dopplers  04/11/2009   Normal bilateral lower ext venous duplex  . PORTACATH PLACEMENT N/A 04/26/2014   Procedure: ULTRASOUND GUIDED PORT A CATHE INSERTION;  Surgeon: Jackolyn Confer, MD;  Location: [facility];  Service: General;  Laterality: N/A;  . Upper Arterial Dopplers  06/02/2011   Right Side PPG Demonstrates siginificantly diminshed waveforms with the Adson's maneuver which may suggest Thoracic Outlet Syndrone. This is a abnormal thoracic outlet arterial evaluation.    Allergies  Allergen Reactions  . Latex Swelling and Rash    Skin swelling   . Sulfonamide Derivatives Anaphylaxis, Shortness Of Breath, Nausea Only and Other (See Comments)    Sweating, headache   . Clarithromycin Nausea Only and Other (See Comments)    Headache   . Crestor [Rosuvastatin] Other (See Comments)    Muscle aches, pain, weakness in lower extremities  . Livalo [Pitavastatin] Other (See Comments)    Leg pain  .  Statins Other (See Comments)    Muscle soreness and weakness w/ Rosuvastatin and Zetia.  . Ciprofloxacin Hcl Nausea Only, Palpitations and Other (See Comments)    Headache     Current Outpatient Medications  Medication Sig Dispense Refill  . albuterol (PROVENTIL HFA;VENTOLIN HFA) 108 (90 Base) MCG/ACT inhaler Inhale 2 puffs into the lungs every 4 (four) hours as needed (cough, shortness of breath or wheezing.). 1 Inhaler 3  . amitriptyline (ELAVIL) 25 MG tablet Take 25 mg by mouth at bedtime as needed for sleep. When patient has migraines    . aspirin EC 81 MG tablet Take 81 mg by mouth daily.    . Calcium Carbonate-Vitamin D (CALTRATE 600+D PO) Take by mouth.    . cetirizine (ZYRTEC) 10 MG tablet Take 10 mg by mouth daily.    . Cholecalciferol (VITAMIN D3) 5000 units CAPS Take 5,000 Units  by mouth 2 (two) times daily.     . flecainide (TAMBOCOR) 100 MG tablet TAKE ONE-HALF TABLET BY MOUTH TWICE DAILY 90 tablet 2  . fluticasone (FLONASE) 50 MCG/ACT nasal spray Place 2 sprays into both nostrils daily. 16 g 3  . ibuprofen (ADVIL,MOTRIN) 200 MG tablet Take 200 mg by mouth every 6 (six) hours as needed for headache or moderate pain.    . Meth-Hyo-M Bl-Na Phos-Ph Sal (URIBEL) 118 MG CAPS Take 118 mg by mouth 4 (four) times daily as needed (for bladder). Reported on 09/09/2015  2  . metoprolol succinate (TOPROL-XL) 50 MG 24 hr tablet TAKE 1 TABLET BY MOUTH DAILY. TAKE WITH OR IMMEDIATELY FOLLOWING A MEAL. 90 tablet 2  . PARoxetine (PAXIL) 20 MG tablet Take 1 tablet (20 mg total) by mouth daily. 90 tablet 1  . rosuvastatin (CRESTOR) 5 MG tablet Take 1 tablet (5 mg total) by mouth daily. 90 tablet 3   Current Facility-Administered Medications  Medication Dose Route Frequency Provider Last Rate Last Dose  . 0.9 %  sodium chloride infusion  500 mL Intravenous Continuous Irene Shipper, MD        Social history is notable in that she is married and has 2 children and 3 grandchildren. 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 There is no tobacco or alcohol use.   Family History  Problem Relation Age of Onset  . Cancer Mother        skin cancer  . Diabetes Sister        Borderline  . Hypertension Sister   . Heart attack Sister   . Breast cancer Sister 70       maternal half-sister; deceased 33  . Diabetes Brother        Borderline; maternal half-brother  . Cancer Maternal Aunt        stomach and throat; heavy smoker and drinker; deceased 69  . Stomach cancer Maternal Aunt   . Cancer Other        breast/ovarian; daughter of mat cousin with unk. primary at 45; pt's 1st cousin-once-removed  . Other Father        accident  . Cancer Cousin 72       unk. primary; daughter of mat aunt with stomach/throat ca  . Colon cancer Neg Hx   . Esophageal cancer Neg Hx   . Rectal cancer Neg Hx     ROS General: Negative; No fevers, chills, or night sweats;  HEENT: Negative; No changes in vision or hearing, sinus congestion, difficulty swallowing Pulmonary: Negative; No cough, wheezing, shortness of breath, hemoptysis Cardiovascular: Negative; No chest pain, presyncope, syncope, palpitations Positive for occasional ankle swelling GI: Negative; No nausea, vomiting, diarrhea, or abdominal pain GU: Negative; No dysuria, hematuria, or difficulty voiding Musculoskeletal: Negative; no myalgias, joint pain, or weakness Hematologic/Oncology: As above, positive for invasive ductal carcinoma of the right breast .  Status post chemotherapy and radiation treatment following lumpectomy Endocrine: Negative; no heat/cold intolerance; no diabetes Neuro: Negative; no changes in balance, headaches Skin: Negative; No rashes or skin lesions Psychiatric: Negative; No behavioral problems, depression Sleep: Nonrestorative sleep, snoring, frequent nocturia, daytime sleepiness, no bruxism, restless legs, hypnogognic hallucinations, no cataplexy Other comprehensive 14 point  system review is negative.   PE BP 124/70 (BP Location: Left Arm, Patient Position: Sitting)   Pulse 60   Ht _0  (1.626 m)   Wt 162 lb 12.8 oz (73.8 kg)   LMP  07/06/1985 (Approximate)   BMI 27.94 kg/m    Repeat blood pressure by me was 122/68  Wt Readings from Last 3 Encounters:  02/06/18 162 lb 12.8 oz (73.8 kg)  12/29/17 163 lb 9.6 oz (74.2 kg)  12/12/17 161 lb 6.4 oz (73.2 kg)   General: Alert, oriented, no distress.  Skin: normal turgor, no rashes, warm and dry HEENT: Normocephalic, atraumatic. Pupils equal round and reactive to light; sclera anicteric; extraocular muscles intact;  Nose without nasal septal hypertrophy Mouth/Parynx benign; Mallinpatti scale 3 Neck: No JVD, no carotid bruits; normal carotid upstroke Lungs: clear to ausculatation and percussion; no wheezing or rales Chest wall: without tenderness to palpitation Heart: PMI not displaced, RRR, s1 s2 normal, 1/6 systolic murmur, no diastolic murmur, no rubs, gallops, thrills, or heaves Abdomen: soft, nontender; no hepatosplenomehaly, BS+; abdominal aorta nontender and not dilated by palpation. Back: no CVA tenderness Pulses 2+ Musculoskeletal: full range of motion, normal strength, no joint deformities Extremities: no clubbing cyanosis or edema, Homan's sign negative  Neurologic: grossly nonfocal; Cranial nerves grossly wnl Psychologic: Normal mood and affect  ECG (independently read by me): Normal sinus rhythm at 60 bpm.  First-degree AV block, PR interval 236 ms per;  Nonspecific T changes.  April 2019 ECG (independently read by me): Sinus bradycardia 55 bpm with first-degree AV block.  PR interval 258 ms.  June 2017 ECG (independently read by me): Sinus bradycardia 55 bpm.  First-degree AV block with a PR interval at 250 ms.  Nonspecific ST-T changes.  April 2016 ECG (independently read by me): Normal sinus rhythm at 63 bpm.  First-degree AV block with a PR interval at 254 ms.  QTc interval 49 ms.   Nonspecific T changes.  September 2015 ECG sinus rhythm with first-degree AV block.  Nonspecific T changes V1 through V3.  Prior ECG: Sinus rhythm at 57 beats per minute, with first degree AV block with PR interval 252 ms. QTc interval is normal at 389 ms. Nonspecific T changes.  LABS:  BMP Latest Ref Rng & Units 01/24/2018 10/12/2017 07/28/2016  Glucose 65 - 99 mg/dL 98 101(H) 95  BUN 8 - 27 mg/dL _0 Creatinine 0.57 - 1.00 mg/dL 0.89 0.88 0.86  BUN/Creat Ratio 12 - 28 9(L) 11(L) -  Sodium 134 - 144 mmol/L 142 142 142  Potassium 3.5 - 5.2 mmol/L 4.3 4.1 4.5  Chloride 96 - 106 mmol/L 104 105 107  CO2 20 - 29 mmol/L _1 Calcium 8.7 - 10.3 mg/dL 9.6 9.7 9.5     Hepatic Function Latest Ref Rng & Units 01/24/2018 10/12/2017 07/28/2016  Total Protein 6.0 - 8.5 g/dL 7.3 7.2 6.9  Albumin 3.6 - 4.8 g/dL 4.5 4.4 4.1  AST 0 - 40 IU/L _2 ALT 0 - 32 IU/L 17 33(H) 16  Alk Phosphatase 39 - 117 IU/L 110 115 86  Total Bilirubin 0.0 - 1.2 mg/dL 0.4 0.4 0.4     CBC Latest Ref Rng & Units 10/12/2017 05/09/2017 04/22/2016  WBC 3.4 - 10.8 x10E3/uL 3.4 4.2 5.6  Hemoglobin 11.1 - 15.9 g/dL 13.1 12.9 13.3  Hematocrit 34.0 - 46.6 % 37.9 37.0 39.8  Platelets 150 - 379 x10E3/uL 243 275 233   Lab Results  Component Value Date   MCV 93 10/12/2017   MCV 90 05/09/2017   MCV 94.0 04/22/2016    Lab Results  Component Value Date   TSH 2.080 10/12/2017   No results found for: HGBA1C  Lipid Panel     Component Value Date/Time   CHOL 172 01/24/2018 1230   CHOL 237 (H) 06/22/2013 1405   TRIG 106 01/24/2018 1230   TRIG 103 06/22/2013 1405   HDL 71 01/24/2018 1230   HDL 74 06/22/2013 1405   CHOLHDL 2.4 01/24/2018 1230   CHOLHDL 3.2 07/28/2016 1151   VLDL 22 07/28/2016 1151   LDLCALC 80 01/24/2018 1230   LDLCALC 142 (H) 06/22/2013 1405     BNP No results found for: PROBNP  Lipid Panel     Component Value Date/Time   CHOL 172 01/24/2018 1230   CHOL 237 (H) 06/22/2013 1405     TRIG 106 01/24/2018 1230   TRIG 103 06/22/2013 1405   HDL 71 01/24/2018 1230   HDL 74 06/22/2013 1405   CHOLHDL 2.4 01/24/2018 1230   CHOLHDL 3.2 07/28/2016 1151   VLDL 22 07/28/2016 1151   LDLCALC 80 01/24/2018 1230   LDLCALC 142 (H) 06/22/2013 1405     RADIOLOGY: No results found.  IMPRESSION:  No diagnosis found. ASSESSMENT AND PLAN: Ms. Wahler is a 67 year old African-American female who has a history of PSVT/PAF which has been  controlled with low-dose flecainide 50 twice a day combined with beta blocker therapy.  He has been maintaining sinus rhythm and has first-degree AV block PR interval of 236 ms.  Denies any chest pain.  She continues to be followed by Dr. Lindi Adie for her breast CA.  Echo Doppler study has shown an EF of 60 to 65% prior to chemotherapy.  Her blood pressure today is well controlled on her current dose of Toprol-XL 50 mg.  She is now on rosuvastatin 5 mg for mild hyperlipidemia was started after her last office visit and most recent lipid studies were markedly improved from 3 months ago when her LDL was 154 to her current LDL reading at 80.  He denies myalgias.  She has had issues with poor sleep.  I reviewed her sleep study in detail.  She did not have definitive obstructive sleep apnea but did have increased respiratory events arousals with an RDI of 12.6. There is a component of increased upper airway resistance syndrome.  She had reduced sleep efficiency at only 50%.  There was no evidence for rem sleep.  It is possible that some of her psychiatric medications including amitriptyline and Paxil may be contributing to REM suppression.  I discussed with her that is possible may be some of this medication can be weaned off or changed to medication which does not suppress REM sleep.  We discussed improved sleep hygiene.  BMI is 27.94.  At present, there is no indication for CPAP therapy.  If she develops progressive symptomatology follow-up study can be undertaken.  I  will see her in 1 year for reevaluation or sooner if problems arise.  Time spent: 25 minutes  Troy Sine, MD, [facility] 02/06/2018 2:35 PM

## 2018-02-06 NOTE — Patient Instructions (Signed)

## 2018-02-09 ENCOUNTER — Other Ambulatory Visit (INDEPENDENT_AMBULATORY_CARE_PROVIDER_SITE_OTHER): Payer: Medicare Other

## 2018-02-09 DIAGNOSIS — I471 Supraventricular tachycardia: Secondary | ICD-10-CM | POA: Diagnosis not present

## 2018-02-09 DIAGNOSIS — I480 Paroxysmal atrial fibrillation: Secondary | ICD-10-CM | POA: Diagnosis not present

## 2018-02-09 DIAGNOSIS — I10 Essential (primary) hypertension: Secondary | ICD-10-CM | POA: Diagnosis not present

## 2018-02-13 ENCOUNTER — Encounter: Payer: Self-pay | Admitting: Cardiovascular Disease

## 2018-03-03 ENCOUNTER — Other Ambulatory Visit: Payer: Self-pay | Admitting: Hematology and Oncology

## 2018-03-03 DIAGNOSIS — Z853 Personal history of malignant neoplasm of breast: Secondary | ICD-10-CM

## 2018-04-07 ENCOUNTER — Ambulatory Visit
Admission: RE | Admit: 2018-04-07 | Discharge: 2018-04-07 | Disposition: A | Discharge status: Home / Self Care | Payer: Medicare Other | Source: Ambulatory Visit | Attending: Hematology and Oncology | Admitting: Hematology and Oncology

## 2018-04-07 DIAGNOSIS — Z853 Personal history of malignant neoplasm of breast: Secondary | ICD-10-CM

## 2018-06-30 ENCOUNTER — Inpatient Hospital Stay: Payer: Medicare Other | Attending: Hematology and Oncology | Admitting: Hematology and Oncology

## 2018-06-30 ENCOUNTER — Telehealth: Payer: Self-pay | Admitting: Hematology and Oncology

## 2018-06-30 DIAGNOSIS — Z170 Estrogen receptor positive status [ER+]: Secondary | ICD-10-CM | POA: Diagnosis not present

## 2018-06-30 DIAGNOSIS — G620 Drug-induced polyneuropathy: Secondary | ICD-10-CM | POA: Diagnosis not present

## 2018-06-30 DIAGNOSIS — C50311 Malignant neoplasm of lower-inner quadrant of right female breast: Secondary | ICD-10-CM | POA: Diagnosis not present

## 2018-06-30 DIAGNOSIS — Z79811 Long term (current) use of aromatase inhibitors: Secondary | ICD-10-CM | POA: Diagnosis not present

## 2018-06-30 MED ORDER — EXEMESTANE 25 MG PO TABS: 25.0000 mg | ORAL_TABLET | Freq: Every day | ORAL | 0 refills | Status: DC

## 2018-06-30 NOTE — Telephone Encounter (Signed)
Scheduled appt per 12/27 los - gave patient AVS and calender per los.  

## 2018-06-30 NOTE — Assessment & Plan Note (Signed)
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 status post lumpectomy, Oncotype DX recurrence score 29 intermediate risk 19% risk of recurrence with hormonal therapy alone  Chemotherapy summary: Taxotere and Cytoxan 4 started 05/07/2014 completed 07/09/2014 Long-term chemotherapy toxicities: Neuropathy Radiation therapy: Started 08/07/2014 completed 09/16/14 ---------------------------------------------------------------------------- Current treatment:Anastrozole 1 mg daily started 10/04/14 switched to letrozole 09/16/2015, switched to exemestane 12/29/2017 Exemestane toxicities:  Breast Cancer Surveillance: 1. Breast exam3/28/2019: Normal 2. Mammogram9/7/2018nabnormalities. Postsurgical changes. Breast Density Category C.  Return to clinic in 1 year for follow-up

## 2018-06-30 NOTE — Progress Notes (Signed)
Patient Care Team: Donzetta Kohut as PCP - General (Physician Assistant) Terance Ice, MD (Inactive) (Cardiology) Carolan Clines, MD as Attending Physician (Urology) Azucena Fallen, MD as Consulting Physician (Obstetrics and Gynecology) Nicholas Lose, MD as Consulting Physician (Hematology and Oncology)  DIAGNOSIS:  Encounter Diagnosis  Name Primary?  . Malignant neoplasm of lower-inner quadrant of right breast of female, estrogen receptor positive ([place])     SUMMARY OF ONCOLOGIC HISTORY:   Breast cancer of lower-inner quadrant of right female breast ([place])   01/18/2014 Mammogram    U/S Breast: 7 x 7 x 9 mm irregular taller than wide hypoechoic mass with a peripheral echogenic rim in the right breast 4 o'clock position 7 cm from the nipple. No right axillary lymphadenopathy.     02/14/2014 Initial Diagnosis    Breast cancer-right Er 100%, PR 94%, Ki 67: 17%; Her2 Neg ratio 1.74; T1bN0Mo (Clinical Stage 1A)    04/02/2014 Surgery    Right breast lumpectomy: Invasive ductal carcinoma negative for lymph vascular invasion, 1.6 cm, grade 2, 3 SLN negative, reexcision margins benign, ER 100%, PR 94%, HER-2 negative Ki-67 70% T1 C. N0 M0 stage IA: Oncotype DX 29 (19%ROR)    05/07/2014 - 07/09/2014 Chemotherapy    Taxotere Cytoxan adjuvant chemotherapy every 3 weeks x4 cycles (after cycle one hospitalization for pain related to Neulasta and severe gastritis due to steroids)    08/07/2014 - 09/16/2014 Radiation Therapy    Adjuvant radiation therapy    10/04/2014 -  Anti-estrogen oral therapy    Anastrozole 1 mg daily changed to letrozole 09/16/2015 due to hot flashes     CHIEF COMPLIANT: Follow-up to discuss antiestrogen treatment plan  INTERVAL HISTORY: Tami [name] is a 67 year old with above-mentioned history of right breast cancer treated with lumpectomy adjuvant chemotherapy and radiation is currently off antiestrogen therapy because of severe aches and pains and hot  flashes related to antiestrogen therapy.  She is here to talk about switching to exemestane and giving that a trial.  She is complaining of feeling depressed and also lack of energy.  She has been taking half a tablet of Paxil instead of the full tablet.  REVIEW OF SYSTEMS:   Constitutional: Denies fevers, chills or abnormal weight loss Eyes: Denies blurriness of vision Ears, nose, mouth, throat, and face: Denies mucositis or sore throat Respiratory: Denies cough, dyspnea or wheezes Cardiovascular: Denies palpitation, chest discomfort Gastrointestinal:  Denies nausea, heartburn or change in bowel habits Skin: Denies abnormal skin rashes Lymphatics: Denies new lymphadenopathy or easy bruising Neurological:Denies numbness, tingling or new weaknesses Behavioral/Psych: Depression and lack of energy Extremities: No lower extremity edema   All other systems were reviewed with the patient and are negative.  I have reviewed the past medical history, past surgical history, social history and family history with the patient and they are unchanged from previous note.  ALLERGIES:  is allergic to latex; sulfonamide derivatives; clarithromycin; crestor [rosuvastatin]; livalo [pitavastatin]; statins; and ciprofloxacin hcl.  MEDICATIONS:  Current Outpatient Medications  Medication Sig Dispense Refill  . albuterol (PROVENTIL HFA;VENTOLIN HFA) 108 (90 Base) MCG/ACT inhaler Inhale 2 puffs into the lungs every 4 (four) hours as needed (cough, shortness of breath or wheezing.). 1 Inhaler 3  . amitriptyline (ELAVIL) 25 MG tablet Take 25 mg by mouth at bedtime as needed for sleep. When patient has migraines    . aspirin EC 81 MG tablet Take 81 mg by mouth daily.    . Calcium Carbonate-Vitamin D (CALTRATE 600+D PO) Take by  mouth.    . cetirizine (ZYRTEC) 10 MG tablet Take 10 mg by mouth daily.    . Cholecalciferol (VITAMIN D3) 5000 units CAPS Take 5,000 Units by mouth 2 (two) times daily.     . flecainide  (TAMBOCOR) 100 MG tablet TAKE ONE-HALF TABLET BY MOUTH TWICE DAILY 90 tablet 2  . fluticasone (FLONASE) 50 MCG/ACT nasal spray Place 2 sprays into both nostrils daily. 16 g 3  . ibuprofen (ADVIL,MOTRIN) 200 MG tablet Take 200 mg by mouth every 6 (six) hours as needed for headache or moderate pain.    . Meth-Hyo-M Bl-Na Phos-Ph Sal (URIBEL) 118 MG CAPS Take 118 mg by mouth 4 (four) times daily as needed (for bladder). Reported on 09/09/2015  2  . metoprolol succinate (TOPROL-XL) 50 MG 24 hr tablet TAKE 1 TABLET BY MOUTH DAILY. TAKE WITH OR IMMEDIATELY FOLLOWING A MEAL. 90 tablet 2  . PARoxetine (PAXIL) 20 MG tablet Take 1 tablet (20 mg total) by mouth daily. 90 tablet 1  . rosuvastatin (CRESTOR) 5 MG tablet Take 1 tablet (5 mg total) by mouth daily. 90 tablet 3   Current Facility-Administered Medications  Medication Dose Route Frequency Provider Last Rate Last Dose  . 0.9 %  sodium chloride infusion  500 mL Intravenous Continuous Irene Shipper, MD        PHYSICAL EXAMINATION: ECOG PERFORMANCE STATUS: 1 - Symptomatic but completely ambulatory  Vitals:   06/30/18 1208  BP: (!) 149/79  Pulse: (!) 55  Resp: 17  Temp: 98.6 F (37 C)  SpO2: 100%   Filed Weights   06/30/18 1208  Weight: 162 lb 1.6 oz (73.5 kg)    GENERAL:alert, no distress and comfortable SKIN: skin color, texture, turgor are normal, no rashes or significant lesions EYES: normal, Conjunctiva are pink and non-injected, sclera clear OROPHARYNX:no exudate, no erythema and lips, buccal mucosa, and tongue normal  NECK: supple, thyroid normal size, non-tender, without nodularity LYMPH:  no palpable lymphadenopathy in the cervical, axillary or inguinal LUNGS: clear to auscultation and percussion with normal breathing effort HEART: regular rate & rhythm and no murmurs and no lower extremity edema ABDOMEN:abdomen soft, non-tender and normal bowel sounds MUSCULOSKELETAL:no cyanosis of digits and no clubbing  NEURO: alert &  oriented x 3 with fluent speech, no focal motor/sensory deficits EXTREMITIES: No lower extremity edema BREAST: No palpable masses or nodules in either right or left breasts. No palpable axillary supraclavicular or infraclavicular adenopathy no breast tenderness or nipple discharge. (exam performed in the presence of a chaperone)  LABORATORY DATA:  I have reviewed the data as listed CMP Latest Ref Rng & Units 01/24/2018 10/12/2017 07/28/2016  Glucose 65 - 99 mg/dL 98 101(H) 95  BUN 8 - 27 mg/dL 8 10 10   Creatinine 0.57 - 1.00 mg/dL 0.89 0.88 0.86  Sodium 134 - 144 mmol/L 142 142 142  Potassium 3.5 - 5.2 mmol/L 4.3 4.1 4.5  Chloride 96 - 106 mmol/L 104 105 107  CO2 20 - 29 mmol/L 22 22 30   Calcium 8.7 - 10.3 mg/dL 9.6 9.7 9.5  Total Protein 6.0 - 8.5 g/dL 7.3 7.2 6.9  Total Bilirubin 0.0 - 1.2 mg/dL 0.4 0.4 0.4  Alkaline Phos 39 - 117 IU/L 110 115 86  AST 0 - 40 IU/L 21 30 20   ALT 0 - 32 IU/L 17 33(H) 16    Lab Results  Component Value Date   WBC 3.4 10/12/2017   HGB 13.1 10/12/2017   HCT 37.9 10/12/2017   MCV  93 10/12/2017   PLT 243 10/12/2017   NEUTROABS 2.5 05/09/2017    ASSESSMENT & PLAN:  Breast cancer of lower-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 status post lumpectomy, Oncotype DX recurrence score 29 intermediate risk 19% risk of recurrence with hormonal therapy alone  Chemotherapy summary: Taxotere and Cytoxan 4 started 05/07/2014 completed 07/09/2014 Long-term chemotherapy toxicities: Neuropathy Radiation therapy: Started 08/07/2014 completed 09/16/14 ---------------------------------------------------------------------------- Current treatment:Anastrozole 1 mg daily started 10/04/14 switched to letrozole 09/16/2015, switched to exemestane 06/30/2018 Patient took a 64-monthbreak from antiestrogen therapy and she would like to go back to taking it at this  time.  Breast Cancer Surveillance: 1. Breast exam3/28/2019: Normal 2. Mammogram9/7/2018nabnormalities. Postsurgical changes. Breast Density Category C.  Return to clinic in 3 months for toxicity check and for follow-up    No orders of the defined types were placed in this encounter.  The patient has a good understanding of the overall plan. she agrees with it. she will call with any problems that may develop before the next visit here.   VHarriette Ohara MD 06/30/18

## 2018-07-08 ENCOUNTER — Other Ambulatory Visit: Payer: Self-pay | Admitting: Physician Assistant

## 2018-07-08 DIAGNOSIS — F419 Anxiety disorder, unspecified: Secondary | ICD-10-CM

## 2018-07-14 ENCOUNTER — Telehealth: Payer: Self-pay

## 2018-07-14 ENCOUNTER — Other Ambulatory Visit: Payer: Self-pay | Admitting: Hematology and Oncology

## 2018-07-14 DIAGNOSIS — F419 Anxiety disorder, unspecified: Secondary | ICD-10-CM

## 2018-07-14 MED ORDER — PAROXETINE HCL 20 MG PO TABS: 20.0000 mg | ORAL_TABLET | Freq: Every day | ORAL | 0 refills | Status: DC

## 2018-07-14 NOTE — Telephone Encounter (Signed)
Patient called in asking for Paxil 20 mg prescription.  She says that She and Dr. Lindi Adie had discussed this before.  Her PCP has left the practice and now she doesn't have a doctor to get Paxil.  Refill sent into [address] for 90 days until patient can see MD.

## 2018-08-02 ENCOUNTER — Telehealth: Payer: Self-pay

## 2018-08-02 MED ORDER — GABAPENTIN 300 MG PO CAPS: 300.0000 mg | ORAL_CAPSULE | Freq: Every day | ORAL | 1 refills | Status: DC

## 2018-08-02 NOTE — Telephone Encounter (Signed)
Called and spoke with pt regarding her symptoms with exemestane.Pt reports that her muscles and joints hurts so bad, that she is unable to move around. Pt is on her 3rd anti estrogen and this has been the worst side effect that she has had to date. Pt stopped her exemestane for a few days and immediately felt better with her symptoms. She had restarted and her pain came back within 4 days of taking exemestane. She had been off since for 7 days now and is doing much better.  Pt states that letrozole was mostly tolerable than anastrozole and exemestane, with the exception of hot flashes. Suggested that pt get back on letrozole and we can supplement her with gabapentin for hot flashes. Pt is willing to try this out to see if it will reduce the hot flashes.   Discussed with Dr.Gudena and okay to send in script for gabapentin for hot flashes. Will send script to preferred pharmacy. Pt to start letrozole every other night x1-2 weeks. Start gabapentin po nightly, starting today. Pt to call back in 2 weeks and report her symptoms then. Pt verbalized understanding and verbally confirmed instructions on how to take letrozole and gabapentin. No further needs at this time.

## 2018-08-21 ENCOUNTER — Other Ambulatory Visit: Payer: Self-pay | Admitting: Cardiovascular Disease

## 2018-09-14 ENCOUNTER — Other Ambulatory Visit: Payer: Self-pay | Admitting: *Deleted

## 2018-09-14 ENCOUNTER — Telehealth: Payer: Self-pay | Admitting: Hematology and Oncology

## 2018-09-14 MED ORDER — LETROZOLE 2.5 MG PO TABS: 2.5000 mg | ORAL_TABLET | Freq: Every day | ORAL | 0 refills | Status: DC

## 2018-09-14 MED ORDER — GABAPENTIN 300 MG PO CAPS: 300.0000 mg | ORAL_CAPSULE | Freq: Every day | ORAL | 0 refills | Status: DC

## 2018-09-14 NOTE — Telephone Encounter (Signed)
Spoke with patient regarding needing a refill for Letrozole and Gabapentin.  Refill for 90 days will be sent to pt pharmacy.  Pt also stated she wanted to cancel her apt on 09/29/2018 with Dr. Lindi Adie.  Informed pt that Dr. Lindi Adie wants to see her in the next 3 months for a follow up and toxicity check.  Message sent to scheduling to reach out to pt and reschedule apt.  Pt verbalized understanding.

## 2018-09-14 NOTE — Telephone Encounter (Signed)
Scheduled appt per 3/12 sch message - pt aware of appt date and time   

## 2018-09-29 ENCOUNTER — Ambulatory Visit: Payer: Medicare Other | Admitting: Hematology and Oncology

## 2018-10-31 ENCOUNTER — Other Ambulatory Visit: Payer: Self-pay | Admitting: Hematology and Oncology

## 2018-10-31 DIAGNOSIS — F419 Anxiety disorder, unspecified: Secondary | ICD-10-CM

## 2018-11-02 ENCOUNTER — Other Ambulatory Visit: Payer: Self-pay | Admitting: Hematology and Oncology

## 2018-11-02 ENCOUNTER — Other Ambulatory Visit: Payer: Self-pay | Admitting: Cardiovascular Disease

## 2018-11-02 DIAGNOSIS — F419 Anxiety disorder, unspecified: Secondary | ICD-10-CM

## 2018-11-03 NOTE — Telephone Encounter (Signed)
Flecainide refilled.

## 2018-12-12 ENCOUNTER — Telehealth: Payer: Self-pay | Admitting: Hematology and Oncology

## 2018-12-12 NOTE — Assessment & Plan Note (Signed)
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 status post lumpectomy, Oncotype DX recurrence score 29 intermediate risk 19% risk of recurrence with hormonal therapy alone  Chemotherapy summary: Taxotere and Cytoxan 4 started 05/07/2014 completed 07/09/2014 Long-term chemotherapy toxicities: Neuropathy Radiation therapy: Started 08/07/2014 completed 09/16/14 ---------------------------------------------------------------------------- Current treatment:Anastrozole 1 mg daily started 10/04/14 switched to letrozole 09/16/2015, switched to exemestane 06/30/2018 Patient took a 77-monthbreak from antiestrogen therapy and she resumed 06/30/2018.  Exemestane toxicities:  Breast Cancer Surveillance: 1. Breast exam3/28/2019: Normal 2. Mammogram10/4/2019noabnormalities. Postsurgical changes. Breast Density Category B.  Return to clinic in 6 months for toxicity check and for follow-up

## 2018-12-12 NOTE — Telephone Encounter (Signed)
I LEFT a message regarding video visit
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
## 2018-12-19 ENCOUNTER — Inpatient Hospital Stay: Payer: Medicare Other | Attending: Hematology and Oncology | Admitting: Hematology and Oncology

## 2018-12-19 DIAGNOSIS — Z9221 Personal history of antineoplastic chemotherapy: Secondary | ICD-10-CM | POA: Diagnosis not present

## 2018-12-19 DIAGNOSIS — C50311 Malignant neoplasm of lower-inner quadrant of right female breast: Secondary | ICD-10-CM

## 2018-12-19 DIAGNOSIS — Z923 Personal history of irradiation: Secondary | ICD-10-CM | POA: Diagnosis not present

## 2018-12-19 DIAGNOSIS — Z170 Estrogen receptor positive status [ER+]: Secondary | ICD-10-CM

## 2018-12-19 DIAGNOSIS — Z79811 Long term (current) use of aromatase inhibitors: Secondary | ICD-10-CM

## 2018-12-21 ENCOUNTER — Encounter: Payer: Self-pay | Admitting: Physical Therapy

## 2018-12-21 ENCOUNTER — Ambulatory Visit: Payer: Medicare Other | Attending: Hematology and Oncology | Admitting: Physical Therapy

## 2018-12-21 ENCOUNTER — Other Ambulatory Visit: Payer: Self-pay

## 2018-12-21 DIAGNOSIS — L599 Disorder of the skin and subcutaneous tissue related to radiation, unspecified: Secondary | ICD-10-CM | POA: Diagnosis present

## 2018-12-21 DIAGNOSIS — M25611 Stiffness of right shoulder, not elsewhere classified: Secondary | ICD-10-CM | POA: Diagnosis present

## 2018-12-21 DIAGNOSIS — M6281 Muscle weakness (generalized): Secondary | ICD-10-CM

## 2018-12-21 DIAGNOSIS — R293 Abnormal posture: Secondary | ICD-10-CM

## 2018-12-21 DIAGNOSIS — M25511 Pain in right shoulder: Secondary | ICD-10-CM

## 2018-12-21 NOTE — Therapy (Signed)
[place], [place], [address] Phone: [phone]   Fax:  [phone]  Physical Therapy Evaluation  Patient Details  Name: Tami [name] MRN: [id number] Date of Birth: 07/23/1950 Referring Provider (PT): Dr. Lindi Adie   Encounter Date: 12/21/2018  PT End of Session - 12/21/18 1223    Visit Number  1    Number of Visits  9    Date for PT Re-Evaluation  01/19/19    PT Start Time  1000    PT Stop Time  1045    PT Time Calculation (min)  45 min    Activity Tolerance  Patient tolerated treatment well    Behavior During Therapy  [facility] for tasks assessed/performed       Past Medical History:  Diagnosis Date  . Anemia    as a child  . Anxiety   . Arthritis   . Asthma   . Atrial fibrillation ([place])   . Breast cancer ([place]) 2015  . Depression with anxiety   . Dysrhythmia    hx AF  . Family history of malignant neoplasm of breast   . Fibromyalgia   . Heart palpitations   . Heartburn   . Hyperlipidemia   . 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 Irregular heartbeat   . Malignant neoplasm of breast (female), unspecified site   . Migraine   . Numbness of arm    Right Arm  . Osteoporosis   . Personal history of chemotherapy 2015   Right Breast Cancer  . Personal history of radiation therapy 2015   Right Breast Cancer  . Pure hypercholesterolemia   . SVT (supraventricular tachycardia) (HCC)     Past Surgical History:  Procedure Laterality Date  . 30 Day Monitor  04/10/2009 to 05/23/2009   No End Of Summary Report Atrial Fib,Narrow Complex Tachycardia,Sinus Rhythm   . ABDOMINAL HYSTERECTOMY  1987  . BREAST EXCISIONAL BIOPSY Left   . BREAST LUMPECTOMY Right 2015  . BREAST LUMPECTOMY WITH NEEDLE LOCALIZATION AND AXILLARY SENTINEL LYMPH NODE BX Right 04/02/2014   Procedure: Right Axillary Lymphatic Mapping; Right axillary Sentinal Node Biopsy; Right Partial  Mastectomy after wire localization;  Surgeon: Jackolyn Confer, MD;  Location: [facility];  Service: General;  Laterality: Right;  . BREAST SURGERY     Left  . CYSTOSCOPY  04/25/2012   Pt may be a candidate for research anti-incontinence procedure protocol  . Dipyridamole Myoview  05/27/2009   The post stress myocardial perfusion images show a normal pattern of perfusion in all regions.The post stress ejection fraction is 83 %. Normal  myocardial perfusion imaging This is a low risk scan.  . DOPPLER ECHOCARDIOGRAPHY  08/21/2010   Ejecion Fraction =>55% All chambers are normal in size and function No significant change from 2010.  [name] lower Venous Dopplers  04/11/2009   Normal bilateral lower ext venous duplex  . PORTACATH PLACEMENT N/A 04/26/2014   Procedure: ULTRASOUND GUIDED PORT A CATHE INSERTION;  Surgeon: Jackolyn Confer, MD;  Location: [facility];  Service: General;  Laterality: N/A;  . Upper Arterial Dopplers  06/02/2011   Right Side PPG Demonstrates siginificantly diminshed waveforms with the Adson's maneuver which may suggest Thoracic Outlet Syndrone. This is a abnormal thoracic outlet arterial evaluation.    There were no vitals filed for this visit.   Subjective Assessment - 12/21/18 1008    Subjective  Pain in right shoulder that may have started after some  gardening last week. She also struggles with pain in multiple joints    Pertinent History  Right breast cancer diagnosed 02/14/2014, lumpectomy with 3 nodes removed 04/02/2014 followed by chemotherapy with numbness and tingling in hands and feet that did resolve, followed by radiation.  She has not had problems with swelling in her arm in the past. currently on letrozole and has problems with joint pain and constipation  past  h/o back problems that contibute numbness and tingling in right leg,    Currently in Pain?  Yes    Pain Score  9     Pain Location  Chest   pinpoint at coracoid process pressure   Pain Orientation  Right    Pain  Descriptors / Indicators  Sharp    Pain Type  Acute pain    Pain Onset  In the past 7 days    Pain Frequency  Intermittent    Aggravating Factors   pressure to area    Pain Relieving Factors  advil and tylenol    Effect of Pain on Daily Activities  interferes with dressing and bathing         [facility] PT Assessment - 12/21/18 0001      Assessment   Medical Diagnosis  right breast cancer     Referring Provider (PT)  Dr. Lindi Adie    Onset Date/Surgical Date  02/14/14    Hand Dominance  Right      Precautions   Precautions  None      Restrictions   Weight Bearing Restrictions  No      Balance Screen   Has the patient fallen in the past 6 months  No    Has the patient had a decrease in activity level because of a fear of falling?   No    Is the patient reluctant to leave their home because of a fear of falling?   No      Home Environment   Living Environment  Private residence    Living Arrangements  Spouse/significant other   daughter and 28 year old grandson, husbands has cancer also    Available Help at Discharge  Family      Prior Function   Level of Independence  Independent with basic ADLs   husband helps her wash her back, diffuculty with bathing   Vocation  Retired    Leisure  pt likes to sew , cook and bake, gardens at home.  Has a treadmill and walks only once a week. used to walk regularly and wants to return       Cognition   Overall Cognitive Status  Within Functional Limits for tasks assessed      Observation/Other Assessments   Observations  Pt appears to have difficulty with mobility getting up an down from chair and all transitional movments     Quick DASH   59.09      Coordination   Gross Motor Movements are Fluid and Coordinated  No   limited by diffuse joint pain      Functional Tests   Functional tests  Sit to Stand      Sit to Stand   Comments  8 reps im 30 sec.  Pt short of breath legs hurting, back tightening up, pt could not do many more  repetitions.       Posture/Postural Control   Posture/Postural Control  Postural limitations    Postural Limitations  Rounded Shoulders;Forward head;Increased lumbar lordosis;Flexed trunk    Posture  Comments  needs cues to straighten knees in standing       ROM / Strength   AROM / PROM / Strength  AROM;Strength      AROM   AROM Assessment Site  Shoulder    Right/Left Shoulder  Right;Left    Right Shoulder Flexion  145 Degrees   slowly and painful   Right Shoulder ABduction  112 Degrees   slow and pain    Right Shoulder Internal Rotation  55 Degrees    Right Shoulder External Rotation  90 Degrees    Left Shoulder Flexion  160 Degrees    Left Shoulder ABduction  160 Degrees      Strength   Overall Strength  Deficits    Overall Strength Comments  genralized decreased stregnth due to history of back pain and generalized joint pain she thinks is from letrozole       Special Tests    Special Tests  Rotator Cuff Impingement    Rotator Cuff Impingment tests  Neer impingement test;Hawkins- Kennedy test      Neer Impingement test    Findings  Positive    Side  Right      Hawkins-Kennedy test   Findings  Negative    Side  Right      Standardized Balance Assessment   Standardized Balance Assessment  Timed Up and Go Test      Timed Up and Go Test   Normal TUG (seconds)  12.58    TUG Comments  uses arms to push up from chair         LYMPHEDEMA/ONCOLOGY QUESTIONNAIRE - 12/21/18 1036      Type   Cancer Type  right breast       Surgeries   Lumpectomy Date  04/02/14    Sentinel Lymph Node Biopsy Date  04/02/14    Number Lymph Nodes Removed  3      Treatment   Past Chemotherapy Treatment  Yes    Past Radiation Treatment  Yes    Current Hormone Treatment  Yes    Drug Name  letrozole       What other symptoms do you have   Are you Having Heaviness or Tightness  No    Are you having Pain  Yes    Are you having pitting edema  No    Is it Hard or Difficult finding clothes  that fit  No    Do you have infections  No    Is there Decreased scar mobility  No    Stemmer Sign  No      Lymphedema Assessments   Lymphedema Assessments  Upper extremities      Right Upper Extremity Lymphedema   10 cm Proximal to Olecranon Process  31.8 cm    Olecranon Process  27 cm    15 cm Proximal to Ulnar Styloid Process  26.3 cm    Just Proximal to Ulnar Styloid Process  17 cm    Across Hand at [employer]  20 cm    At [employer] of 2nd Digit  6.5 cm      Left Upper Extremity Lymphedema   10 cm Proximal to Olecranon Process  30.3 cm    Olecranon Process  26 cm    15 cm Proximal to Ulnar Styloid Process  24.8 cm    Just Proximal to Ulnar Styloid Process  16.5 cm    Across Hand at [employer]  19.7 cm  At [facility] of 2nd Digit  6.5 cm          Quick Dash - 12/21/18 0001    Open a tight or new jar  Severe difficulty    Do heavy household chores (wash walls, wash floors)  Severe difficulty    Carry a shopping bag or briefcase  Moderate difficulty    Wash your back  Unable    Use a knife to cut food  Mild difficulty    Recreational activities in which you take some force or impact through your arm, shoulder, or hand (golf, hammering, tennis)  Moderate difficulty    During the past week, to what extent has your arm, shoulder or hand problem interfered with your normal social activities with family, friends, neighbors, or groups?  Quite a bit    During the past week, to what extent has your arm, shoulder or hand problem limited your work or other regular daily activities  Quite a bit    Arm, shoulder, or hand pain.  Severe    Tingling (pins and needles) in your arm, shoulder, or hand  None    Difficulty Sleeping  Moderate difficulty    DASH Score  59.09 %        Objective measurements completed on examination: See above findings.                   PT Long Term Goals - 12/21/18 1051      PT LONG TERM GOAL #1   Title  Right anterior shoulder pain  willdecrease to 5/10    Baseline  9/10    Time  4    Period  Weeks    Status  New      PT LONG TERM GOAL #2   Title  pt will be Independent in a home exercise program for shoulder range of motion and strength    Time  4    Period  Weeks    Status  New      PT LONG TERM GOAL #3   Title  Pt derease quick dash scoreto < 30  indicating an improvment in UE function    Baseline  59.09    Time  4    Period  Weeks    Status  New      PT LONG TERM GOAL #4   Title  Pt will decrease  TUG to < 10 seconds demonstrating and improvement in general mobility    Baseline  12.58 sec on 06/22/2019    Time  4    Period  Weeks    Status  New      PT LONG TERM GOAL #5   Title  Pt will verbalize lymphedema risk reduction practices and have decrease in left arm circumference at 15 cm proximal to the olecranon to 31 cm    Baseline  31.8    Time  4    Period  Weeks    Status  New             Plan - 12/21/18 1224    Clinical Impression Statement  Pt presents with exquisite tenderness with palpation at site of pec minor insertion at right coracoid process with limited shoulder range of  motion and strength as a result.  She also has tightness and trigger points in surrounding shoulder muscles. She has increased swelling in right arm that could possibly be to the acute inflammatory process of her shoulder. She would benefit from a  trial of iontophoresis to this area. She also has generalized pain and decreased functional mobility from previous back pain and possibly as a medication side effect    Personal Factors and Comorbidities  Comorbidity 3+    Comorbidities  previous breast surgery with lymph node removal, radiation and hormone treatment with side effect of joint pain, past back pain    Examination-Activity Limitations  Bathing;Dressing;Transfers;Locomotion Level    Stability/Clinical Decision Making  Evolving/Moderate complexity   pt with new onset of shoulder pain /swelling that is  progressing   Clinical Decision Making  Moderate    Rehab Potential  Good    PT Frequency  2x / week    PT Duration  4 weeks    PT Treatment/Interventions  ADLs/Self Care Home Management;Iontophoresis 4mg /ml Dexamethasone;Therapeutic exercise;Neuromuscular re-education;Patient/family education;Orthotic Fit/Training;Manual techniques;Manual lymph drainage;Compression bandaging;Passive range of motion;Taping;Joint Manipulations    PT Next Visit Plan  check to see if order for ionto is signed.  Begin with meeks decompression for anterior chest stretch, progress with scapular mobility exercises, soft tissue work, Materials engineer.  monitor right arm for lymphedema and teach risk reduction practices. Teach core strengthening and LE stretnghening for general home strenth program, encourage walking to help with back and joint pain    Consulted and Agree with Plan of Care  Patient       Patient will benefit from skilled therapeutic intervention in order to improve the following deficits and impairments:  Pain, Postural dysfunction, Impaired UE functional use, Increased fascial restricitons, Decreased strength, Decreased knowledge of use of DME, Decreased activity tolerance, Decreased knowledge of precautions, Decreased range of motion, Increased edema  Visit Diagnosis: 1. Acute pain of right shoulder   2. Stiffness of right shoulder joint   3. Muscle weakness (generalized)   4. Abnormal posture   5. Disorder of the skin and subcutaneous tissue related to radiation, unspecified        Problem List Patient Active Problem List   Diagnosis Date Noted  . Pelvic and perineal pain 08/19/2017  . Right hip pain 07/26/2017  . Weakness 07/26/2017  . Gait abnormality 07/26/2017  . Depression 04/23/2016  . Nausea without vomiting 04/23/2016  . Trigger finger, acquired 03/26/2016  . Swelling of both ankles 12/18/2015  . First degree heart block 12/18/2015  . Abdominal pain   . Abnormal LFTs   . Oral candidiasis  05/14/2014  . Dehydration 05/14/2014  . Generalized pain 05/14/2014  . PAF (paroxysmal atrial fibrillation) ([place]) 05/14/2014  . PUD (peptic ulcer disease) 05/14/2014  . Transaminitis 05/14/2014  . Normocytic anemia 05/14/2014  . Epigastric abdominal pain 05/14/2014  . Family history of malignant neoplasm of breast   . Breast cancer of lower-inner quadrant of right female breast ([place]) 02/14/2014  . Hyperlipidemia 05/23/2013  . Caregiver stress 10/23/2012  . Osteoporosis, unspecified 08/24/2012  . DDD (degenerative disc disease), lumbar 08/23/2012  . Female stress incontinence 05/28/2012  . Hydronephrosis 05/28/2012  . Numbness and tingling of right arm and leg 08/23/2011  . Essential hypertension 09/14/2010  . Abdominal pain, epigastric 09/14/2010  . IRRITABLE BOWEL SYNDROME 12/29/2009  . OSTEOARTHRITIS 12/29/2009  . 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 PSVT 07/11/2008  . GERD 06/24/2000  . CANDIDIASIS, ESOPHAGEAL 04/19/1995  . ASTHMA UNSPECIFIED WITH EXACERBATION 04/19/1995   Donato Heinz. Owens Shark PT  Norwood Levo 12/21/2018, 5:41 PM  [place], [place], [address] Phone: [phone]   Fax:  [phone]  Name: Tami [name] MRN: [id number]  Date of Birth: 08-Jan-1951

## 2018-12-23 ENCOUNTER — Other Ambulatory Visit: Payer: Self-pay | Admitting: Hematology and Oncology

## 2018-12-23 ENCOUNTER — Other Ambulatory Visit: Payer: Self-pay | Admitting: Cardiovascular Disease

## 2018-12-27 ENCOUNTER — Other Ambulatory Visit: Payer: Self-pay

## 2018-12-27 ENCOUNTER — Ambulatory Visit: Payer: Medicare Other

## 2018-12-27 DIAGNOSIS — M25611 Stiffness of right shoulder, not elsewhere classified: Secondary | ICD-10-CM

## 2018-12-27 DIAGNOSIS — L599 Disorder of the skin and subcutaneous tissue related to radiation, unspecified: Secondary | ICD-10-CM

## 2018-12-27 DIAGNOSIS — M25511 Pain in right shoulder: Secondary | ICD-10-CM | POA: Diagnosis not present

## 2018-12-27 DIAGNOSIS — M6281 Muscle weakness (generalized): Secondary | ICD-10-CM

## 2018-12-27 DIAGNOSIS — R293 Abnormal posture: Secondary | ICD-10-CM

## 2018-12-27 NOTE — Patient Instructions (Addendum)
1. Decompression Exercise     Cancer Rehab [phone]    Lie on back on firm surface, knees bent, feet flat, arms turned up, out to sides, backs of hands down. Time _5-15__ minutes. Surface: floor   2. Shoulder Press    Start in Decompression Exercise position. Press shoulders downward towards supporting surface. Hold __2-3__ seconds while counting out loud. Repeat _3-5___ times. Do _1-2___ times per day.   3. Head Press    Bring cervical spine (neck) into neutral position (by either tucking the chin towards the chest or tilting the chin upward). Feel weight on back of head. Press head downward into supporting surface.    Hold _2-3__ seconds. Repeat _3-5__ times. Do _1-2__ times per day.   4. Leg Lengthener    Straighten one leg. Pull toes AND forefoot toward knee, extend heel. Lengthen leg by pulling pelvis away from ribs. Hold _2-3__ seconds. Relax. Repeat __4-6__ times. Do other leg.  Surface: floor   5. Leg Press    Straighten one leg down to floor keeping leg aligned with hip. Pull toes AND forefoot toward knee; extend heel.  Press entire leg downward (as if pressing leg into sandy beach). DO NOT BEND KNEE. Hold _2-3__ seconds. Do __4-6__ times. Repeat with other leg.   IONTOPHORESIS PATIENT PRECAUTIONS & CONTRAINDICATIONS:  . Redness under one or both electrodes can occur.  This characterized by a uniform redness that usually disappears within 12 hours of treatment. . Small pinhead size blisters may result in response to the drug.  Contact your physician if the problem persists more than 24 hours. . On rare occasions, iontophoresis therapy can result in temporary skin reactions such as rash, inflammation, irritation or burns.  The skin reactions may be the result of individual sensitivity to the ionic solution used, the condition of the skin at the start of treatment, reaction to the materials in the electrodes, allergies or sensitivity to dexamethasone, or a poor  connection between the patch and your skin.  Discontinue using iontophoresis if you have any of these reactions and report to your therapist. . Remove the Patch or electrodes if you have any undue sensation of pain or burning during the treatment and report discomfort to your therapist. . Tell your Therapist if you have had known adverse reactions to the application of electrical current. . If using the Patch, the LED light will turn off when treatment is complete and the patch can be removed.  Approximate treatment time is 1-3 hours.  Remove the patch when light goes off or after 6 hours. . The Patch can be worn during normal activity, however excessive motion where the electrodes have been placed can cause poor contact between the skin and the electrode or uneven electrical current resulting in greater risk of skin irritation. [name] Keep out of the reach of children.   . DO NOT use if you have a cardiac pacemaker or any other electrically sensitive implanted device. . DO NOT use if you have a known sensitivity to dexamethasone. . DO NOT use during Magnetic Resonance Imaging (MRI). . DO NOT use over broken or compromised skin (e.g. sunburn, cuts, or acne) due to the increased risk of skin reaction. . DO NOT SHAVE over the area to be treated:  To establish good contact between the Patch and the skin, excessive hair may be clipped. . DO NOT place the Patch or electrodes on or over your eyes, directly over your heart, or brain. . DO NOT reuse the  Patch or electrodes as this may cause burns to occur.

## 2018-12-27 NOTE — Therapy (Signed)
[place], [place], [address] Phone: [phone]   Fax:  [phone]  Physical Therapy Treatment  Patient Details  Name: Tami [name] MRN: [id number] Date of Birth: 09-03-1950 Referring Provider (PT): Dr. Lindi Adie   Encounter Date: 12/27/2018  PT End of Session - 12/27/18 0958    Visit Number  2    Number of Visits  9    Date for PT Re-Evaluation  01/19/19    PT Start Time  0902    PT Stop Time  0952    PT Time Calculation (min)  50 min    Activity Tolerance  Patient tolerated treatment well    Behavior During Therapy  [facility] for tasks assessed/performed       Past Medical History:  Diagnosis Date  . Anemia    as a child  . Anxiety   . Arthritis   . Asthma   . Atrial fibrillation ([place])   . Breast cancer ([place]) 2015  . Depression with anxiety   . Dysrhythmia    hx AF  . Family history of malignant neoplasm of breast   . Fibromyalgia   . Heart palpitations   . Heartburn   . Hyperlipidemia   . 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 Irregular heartbeat   . Malignant neoplasm of breast (female), unspecified site   . Migraine   . Numbness of arm    Right Arm  . Osteoporosis   . Personal history of chemotherapy 2015   Right Breast Cancer  . Personal history of radiation therapy 2015   Right Breast Cancer  . Pure hypercholesterolemia   . SVT (supraventricular tachycardia) (HCC)     Past Surgical History:  Procedure Laterality Date  . 30 Day Monitor  04/10/2009 to 05/23/2009   No End Of Summary Report Atrial Fib,Narrow Complex Tachycardia,Sinus Rhythm   . ABDOMINAL HYSTERECTOMY  1987  . BREAST EXCISIONAL BIOPSY Left   . BREAST LUMPECTOMY Right 2015  . BREAST LUMPECTOMY WITH NEEDLE LOCALIZATION AND AXILLARY SENTINEL LYMPH NODE BX Right 04/02/2014   Procedure: Right Axillary Lymphatic Mapping; Right axillary Sentinal Node Biopsy; Right Partial  Mastectomy after wire localization;  Surgeon: Jackolyn Confer, MD;  Location: [facility];  Service: General;  Laterality: Right;  . BREAST SURGERY     Left  . CYSTOSCOPY  04/25/2012   Pt may be a candidate for research anti-incontinence procedure protocol  . Dipyridamole Myoview  05/27/2009   The post stress myocardial perfusion images show a normal pattern of perfusion in all regions.The post stress ejection fraction is 83 %. Normal  myocardial perfusion imaging This is a low risk scan.  . DOPPLER ECHOCARDIOGRAPHY  08/21/2010   Ejecion Fraction =>55% All chambers are normal in size and function No significant change from 2010.  [name] lower Venous Dopplers  04/11/2009   Normal bilateral lower ext venous duplex  . PORTACATH PLACEMENT N/A 04/26/2014   Procedure: ULTRASOUND GUIDED PORT A CATHE INSERTION;  Surgeon: Jackolyn Confer, MD;  Location[facility];  Service: General;  Laterality: N/A;  . Upper Arterial Dopplers  06/02/2011   Right Side PPG Demonstrates siginificantly diminshed waveforms with the Adson's maneuver which may suggest Thoracic Outlet Syndrone. This is a abnormal thoracic outlet arterial evaluation.    There were no vitals filed for this visit.  Subjective Assessment - 12/27/18 0905    Subjective  I forgot to mention to Helene Kelp last time that I also  have been having inner thigh pain after being up for about an hour. My Rt shoulder this morning is just achy.    Pertinent History  Right breast cancer diagnosed 02/14/2014, lumpectomy with 3 nodes removed 04/02/2014 followed by chemotherapy with numbness and tingling in hands and feet that did resolve, followed by radiation.  She has not had problems with swelling in her arm in the past. currently on letrozole and has problems with joint pain and constipation  past  h/o back problems that contibute numbness and tingling in right leg,    Currently in Pain?  Yes    Pain Score  1     Pain Location  Shoulder    Pain Orientation  Right    Pain  Descriptors / Indicators  Aching    Pain Type  Chronic pain    Pain Onset  More than a month ago    Pain Frequency  Intermittent    Aggravating Factors   reaching high; lifting    Pain Relieving Factors  rest, tylenol or advil                       [facility] Adult PT Treatment/Exercise - 12/27/18 0001      Exercises   Exercises  Other Exercises    Other Exercises   Meeks Decompression exercises in supine 5x each with 5 sec holds with pt returing hterapist demo for all      Modalities   Modalities  Iontophoresis      Iontophoresis   Type of Iontophoresis  Dexamethasone    Location  Rt anterior shoulder     Dose  4 mg/mL    Time  4-6 hr wear, pt instructed to remove then      Manual Therapy   Manual Therapy  Soft tissue mobilization;Myofascial release;Passive ROM    Soft tissue mobilization  To Rt anterior shoulder and chest area to decrease tight tissue    Myofascial Release  To Rt UE at axilla and UE pulling throughout ROM    Passive ROM  To Rt shoulder into flexion, abduction and D2 to tolerance             PT Education - 12/27/18 0911    Education Details  Meeks Decompression exercises and precautions and contraindications for iontophoresis    Person(s) Educated  Patient    Methods  Explanation;Demonstration;Handout    Comprehension  Verbalized understanding;Returned demonstration          PT Long Term Goals - 12/21/18 1051      PT LONG TERM GOAL #1   Title  Right anterior shoulder pain willdecrease to 5/10    Baseline  9/10    Time  4    Period  Weeks    Status  New      PT LONG TERM GOAL #2   Title  pt will be Independent in a home exercise program for shoulder range of motion and strength    Time  4    Period  Weeks    Status  New      PT LONG TERM GOAL #3   Title  Pt derease quick dash scoreto < 30  indicating an improvment in UE function    Baseline  59.09    Time  4    Period  Weeks    Status  New      PT LONG TERM GOAL #4    Title  Pt will decrease  TUG to < 10 seconds demonstrating and improvement in general mobility    Baseline  12.58 sec on 06/22/2019    Time  4    Period  Weeks    Status  New      PT LONG TERM GOAL #5   Title  Pt will verbalize lymphedema risk reduction practices and have decrease in left arm circumference at 15 cm proximal to the olecranon to 31 cm    Baseline  31.8    Time  4    Period  Weeks    Status  New            Plan - 12/27/18 0900    Clinical Impression Statement  Pt continues to present with extreme tenderness to palpation at Rt anerior shoulder and chest, especially at coracoid process. Trial of iontophoresis to this area today with instructions of when to remove and precautions/contraindications.  Pt verbalized understanding all. Also instructed and issued handout for Meeks Demopression exericses which pt tolerated well. She had trouble initally with relaxing for manual therapy and P/ROM but htis did improve by end of session as did her P/ROM.    Personal Factors and Comorbidities  Comorbidity 3+    Comorbidities  previous breast surgery with lymph node removal, radiation and hormone treatment with side effect of joint pain, past back pain    Examination-Activity Limitations  Bathing;Dressing;Transfers;Locomotion Level    Stability/Clinical Decision Making  Evolving/Moderate complexity    Rehab Potential  Good    PT Frequency  2x / week    PT Duration  4 weeks    PT Treatment/Interventions  ADLs/Self Care Home Management;Iontophoresis 4mg /ml Dexamethasone;Therapeutic exercise;Neuromuscular re-education;Patient/family education;Orthotic Fit/Training;Manual techniques;Manual lymph drainage;Compression bandaging;Passive range of motion;Taping;Joint Manipulations    PT Next Visit Plan  Ionto ok? Cont if so. Review MEeks decompression prn and progress with scapular mobility, cont soft tissue work and P/ROM to Rt upper quadrant; monitor Rt UE for lymphedmea and teach risk  reduction practices. Teach core and LE strength for general HEP, encourage walking to help with back and joint pain    PT Home Exercise Plan  Meeks, when to remove ionto    Consulted and Agree with Plan of Care  Patient       Patient will benefit from skilled therapeutic intervention in order to improve the following deficits and impairments:  Pain, Postural dysfunction, Impaired UE functional use, Increased fascial restricitons, Decreased strength, Decreased knowledge of use of DME, Decreased activity tolerance, Decreased knowledge of precautions, Decreased range of motion, Increased edema  Visit Diagnosis: 1. Acute pain of right shoulder   2. Stiffness of right shoulder joint   3. Muscle weakness (generalized)   4. Abnormal posture   5. Disorder of the skin and subcutaneous tissue related to radiation, unspecified        Problem List Patient Active Problem List   Diagnosis Date Noted  . Pelvic and perineal pain 08/19/2017  . Right hip pain 07/26/2017  . Weakness 07/26/2017  . Gait abnormality 07/26/2017  . Depression 04/23/2016  . Nausea without vomiting 04/23/2016  . Trigger finger, acquired 03/26/2016  . Swelling of both ankles 12/18/2015  . First degree heart block 12/18/2015  . Abdominal pain   . Abnormal LFTs   . Oral candidiasis 05/14/2014  . Dehydration 05/14/2014  . Generalized pain 05/14/2014  . PAF (paroxysmal atrial fibrillation) ([place]) 05/14/2014  . PUD (peptic ulcer disease) 05/14/2014  . Transaminitis 05/14/2014  . Normocytic anemia 05/14/2014  . Epigastric abdominal  pain 05/14/2014  . Family history of malignant neoplasm of breast   . Breast cancer of lower-inner quadrant of right female breast ([place]) 02/14/2014  . Hyperlipidemia 05/23/2013  . Caregiver stress 10/23/2012  . Osteoporosis, unspecified 08/24/2012  . DDD (degenerative disc disease), lumbar 08/23/2012  . Female stress incontinence 05/28/2012  . Hydronephrosis 05/28/2012  . Numbness and  tingling of right arm and leg 08/23/2011  . Essential hypertension 09/14/2010  . Abdominal pain, epigastric 09/14/2010  . IRRITABLE BOWEL SYNDROME 12/29/2009  . OSTEOARTHRITIS 12/29/2009  . 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 PSVT 07/11/2008  . GERD 06/24/2000  . CANDIDIASIS, ESOPHAGEAL 04/19/1995  . ASTHMA UNSPECIFIED WITH EXACERBATION 04/19/1995    Otelia Limes, PTA 12/27/2018, 10:04 AM  [place], [place], [address] Phone: [phone]   Fax:  [phone]  Name: Tami [name] MRN: [id number] Date of Birth: 1951-06-22

## 2018-12-28 ENCOUNTER — Other Ambulatory Visit: Payer: Self-pay | Admitting: Cardiovascular Disease

## 2018-12-28 ENCOUNTER — Ambulatory Visit: Payer: Medicare Other | Admitting: Physical Therapy

## 2018-12-28 ENCOUNTER — Other Ambulatory Visit: Payer: Self-pay

## 2019-01-02 ENCOUNTER — Encounter: Payer: Self-pay | Admitting: Physical Therapy

## 2019-01-02 ENCOUNTER — Other Ambulatory Visit: Payer: Self-pay

## 2019-01-02 ENCOUNTER — Ambulatory Visit: Payer: Medicare Other | Admitting: Physical Therapy

## 2019-01-02 DIAGNOSIS — R293 Abnormal posture: Secondary | ICD-10-CM

## 2019-01-02 DIAGNOSIS — M25611 Stiffness of right shoulder, not elsewhere classified: Secondary | ICD-10-CM

## 2019-01-02 DIAGNOSIS — M25511 Pain in right shoulder: Secondary | ICD-10-CM

## 2019-01-02 DIAGNOSIS — M6281 Muscle weakness (generalized): Secondary | ICD-10-CM

## 2019-01-02 DIAGNOSIS — L599 Disorder of the skin and subcutaneous tissue related to radiation, unspecified: Secondary | ICD-10-CM

## 2019-01-02 NOTE — Therapy (Signed)
[place], [place], [address] Phone: [phone]   Fax:  [phone]  Physical Therapy Treatment  Patient Details  Name: Tami [name] MRN: [id number] Date of Birth: May 07, 1951 Referring Provider (PT): Dr. Lindi Adie   Encounter Date: 01/02/2019  PT End of Session - 01/02/19 1648    Visit Number  3    Date for PT Re-Evaluation  01/19/19    PT Start Time  7412    PT Stop Time  1615    PT Time Calculation (min)  44 min    Activity Tolerance  Patient tolerated treatment well    Behavior During Therapy  [facility] for tasks assessed/performed       Past Medical History:  Diagnosis Date  . Anemia    as a child  . Anxiety   . Arthritis   . Asthma   . Atrial fibrillation ([place])   . Breast cancer ([place]) 2015  . Depression with anxiety   . Dysrhythmia    hx AF  . Family history of malignant neoplasm of breast   . Fibromyalgia   . Heart palpitations   . Heartburn   . Hyperlipidemia   . 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 Irregular heartbeat   . Malignant neoplasm of breast (female), unspecified site   . Migraine   . Numbness of arm    Right Arm  . Osteoporosis   . Personal history of chemotherapy 2015   Right Breast Cancer  . Personal history of radiation therapy 2015   Right Breast Cancer  . Pure hypercholesterolemia   . SVT (supraventricular tachycardia) (HCC)     Past Surgical History:  Procedure Laterality Date  . 30 Day Monitor  04/10/2009 to 05/23/2009   No End Of Summary Report Atrial Fib,Narrow Complex Tachycardia,Sinus Rhythm   . ABDOMINAL HYSTERECTOMY  1987  . BREAST EXCISIONAL BIOPSY Left   . BREAST LUMPECTOMY Right 2015  . BREAST LUMPECTOMY WITH NEEDLE LOCALIZATION AND AXILLARY SENTINEL LYMPH NODE BX Right 04/02/2014   Procedure: Right Axillary Lymphatic Mapping; Right axillary Sentinal Node Biopsy; Right Partial Mastectomy after wire  localization;  Surgeon: Jackolyn Confer, MD;  Location: [facility];  Service: General;  Laterality: Right;  . BREAST SURGERY     Left  . CYSTOSCOPY  04/25/2012   Pt may be a candidate for research anti-incontinence procedure protocol  . Dipyridamole Myoview  05/27/2009   The post stress myocardial perfusion images show a normal pattern of perfusion in all regions.The post stress ejection fraction is 83 %. Normal  myocardial perfusion imaging This is a low risk scan.  . DOPPLER ECHOCARDIOGRAPHY  08/21/2010   Ejecion Fraction =>55% All chambers are normal in size and function No significant change from 2010.  [name] lower Venous Dopplers  04/11/2009   Normal bilateral lower ext venous duplex  . PORTACATH PLACEMENT N/A 04/26/2014   Procedure: ULTRASOUND GUIDED PORT A CATHE INSERTION;  Surgeon: Jackolyn Confer, MD;  Location[facility];  Service: General;  Laterality: N/A;  . Upper Arterial Dopplers  06/02/2011   Right Side PPG Demonstrates siginificantly diminshed waveforms with the Adson's maneuver which may suggest Thoracic Outlet Syndrone. This is a abnormal thoracic outlet arterial evaluation.    There were no vitals filed for this visit.  Subjective Assessment - 01/02/19 1536    Subjective  Pt states she is feeling better. She has been doing her Meeks decompression but next time because he mattress  is too soft.  She can't say the ionto helped her.  She has an area of scabbed skin on front of her chest from the adhesive from the ionto patch    Pertinent History  Right breast cancer diagnosed 02/14/2014, lumpectomy with 3 nodes removed 04/02/2014 followed by chemotherapy with numbness and tingling in hands and feet that did resolve, followed by radiation.  She has not had problems with swelling in her arm in the past. currently on letrozole and has problems with joint pain and constipation  past  h/o back problems that contibute numbness and tingling in right leg,    Currently in Pain?  Yes    Pain Score  3      Pain Location  Shoulder    Pain Orientation  Right    Pain Descriptors / Indicators  Aching    Pain Type  Chronic pain    Pain Radiating Towards  none    Pain Onset  More than a month ago    Pain Frequency  Intermittent    Aggravating Factors   using her arm    Pain Relieving Factors  rest                       [facility] Adult PT Treatment/Exercise - 01/02/19 0001      Exercises   Exercises  Shoulder;Lumbar      Lumbar Exercises: Seated   Other Seated Lumbar Exercises  neck, upper thoracic, and "cat/cow" stretches      Lumbar Exercises: Supine   Other Supine Lumbar Exercises  lower trunk rotation with arms in goal post position       Shoulder Exercises: Supine   Protraction  AAROM;Right;Left;5 reps   with dowel    External Rotation  AROM;Right;10 reps    External Rotation Limitations  no pain or difficulty     Flexion  AAROM;Right;Left;5 reps   with dowel      Shoulder Exercises: Sidelying   ABduction  AROM;Right;5 reps    Other Sidelying Exercises  small circles 5 reps in each direction       Manual Therapy   Manual Therapy  Joint mobilization;Soft tissue mobilization;Myofascial release;Scapular mobilization;Manual Traction    Joint Mobilization  to [facility] joint and clavicle, distraction to [facility] joint     Soft tissue mobilization  with pt in left sidelying and with thick massage cream to posterior shoulder and upper trap.  Pt VERY tender especailly at posterior axilla near latts tendon     Myofascial Release  to SCM muscle , posterior shoulder and axilla     Scapular Mobilization  inferior glide and mobililizations in multiple directions     Passive ROM  To Rt shoulder into flexion, abduction and D2 to tolerance                  PT Long Term Goals - 12/21/18 1051      PT LONG TERM GOAL #1   Title  Right anterior shoulder pain willdecrease to 5/10    Baseline  9/10    Time  4    Period  Weeks    Status  New      PT LONG TERM GOAL #2   Title   pt will be Independent in a home exercise program for shoulder range of motion and strength    Time  4    Period  Weeks    Status  New      PT LONG  TERM GOAL #3   Title  Pt derease quick dash scoreto < 30  indicating an improvment in UE function    Baseline  59.09    Time  4    Period  Weeks    Status  New      PT LONG TERM GOAL #4   Title  Pt will decrease  TUG to < 10 seconds demonstrating and improvement in general mobility    Baseline  12.58 sec on 06/22/2019    Time  4    Period  Weeks    Status  New      PT LONG TERM GOAL #5   Title  Pt will verbalize lymphedema risk reduction practices and have decrease in left arm circumference at 15 cm proximal to the olecranon to 31 cm    Baseline  31.8    Time  4    Period  Weeks    Status  New            Plan - 01/02/19 1649    Clinical Impression Statement  Pt persists with extreme tenderness and tightness at anterior neck ( SCM) all posterior shoulder and scapulare muscles with decreased tolerance to palapation.  Tried to work on mobility with AAROM with dowel and lower trunk rotation to facilitate stretch in chest and latts, but pt still with pain. Reinforced that her pain did not mean that she was having tissue injury and that motion and mobility would utimately benefit.  Pt reported that she had shoulder pain when she left therapy today    Comorbidities  previous breast surgery with lymph node removal, radiation and hormone treatment with side effect of joint pain, past back pain    PT Treatment/Interventions  ADLs/Self Care Home Management;Iontophoresis 4mg /ml Dexamethasone;Therapeutic exercise;Neuromuscular re-education;Patient/family education;Orthotic Fit/Training;Manual techniques;Manual lymph drainage;Compression bandaging;Passive range of motion;Taping;Joint Manipulations    PT Next Visit Plan  Ionto ok? Cont if so. Review MEeks decompression prn and progress with scapular mobility, cont soft tissue work and P/ROM to Rt  upper quadrant; monitor Rt UE for lymphedmea and teach risk reduction practices. Teach core and LE strength for general HEP, encourage walking to help with back and joint pain    Consulted and Agree with Plan of Care  Patient       Patient will benefit from skilled therapeutic intervention in order to improve the following deficits and impairments:  Pain, Postural dysfunction, Impaired UE functional use, Increased fascial restricitons, Decreased strength, Decreased knowledge of use of DME, Decreased activity tolerance, Decreased knowledge of precautions, Decreased range of motion, Increased edema  Visit Diagnosis: 1. Acute pain of right shoulder   2. Stiffness of right shoulder joint   3. Muscle weakness (generalized)   4. Abnormal posture   5. Disorder of the skin and subcutaneous tissue related to radiation, unspecified        Problem List Patient Active Problem List   Diagnosis Date Noted  . Pelvic and perineal pain 08/19/2017  . Right hip pain 07/26/2017  . Weakness 07/26/2017  . Gait abnormality 07/26/2017  . Depression 04/23/2016  . Nausea without vomiting 04/23/2016  . Trigger finger, acquired 03/26/2016  . Swelling of both ankles 12/18/2015  . First degree heart block 12/18/2015  . Abdominal pain   . Abnormal LFTs   . Oral candidiasis 05/14/2014  . Dehydration 05/14/2014  . Generalized pain 05/14/2014  . PAF (paroxysmal atrial fibrillation) ([place]) 05/14/2014  . PUD (peptic ulcer disease) 05/14/2014  . Transaminitis 05/14/2014  .  Normocytic anemia 05/14/2014  . Epigastric abdominal pain 05/14/2014  . Family history of malignant neoplasm of breast   . Breast cancer of lower-inner quadrant of right female breast ([place]) 02/14/2014  . Hyperlipidemia 05/23/2013  . Caregiver stress 10/23/2012  . Osteoporosis, unspecified 08/24/2012  . DDD (degenerative disc disease), lumbar 08/23/2012  . Female stress incontinence 05/28/2012  . Hydronephrosis 05/28/2012  . Numbness and  tingling of right arm and leg 08/23/2011  . Essential hypertension 09/14/2010  . Abdominal pain, epigastric 09/14/2010  . IRRITABLE BOWEL SYNDROME 12/29/2009  . OSTEOARTHRITIS 12/29/2009  . 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 PSVT 07/11/2008  . GERD 06/24/2000  . CANDIDIASIS, ESOPHAGEAL 04/19/1995  . ASTHMA UNSPECIFIED WITH EXACERBATION 04/19/1995   Donato Heinz. Owens Shark PT  Norwood Levo 01/02/2019, 4:56 PM  [place], [place], [address] Phone: [phone]   Fax:  [phone]  Name: Tami [name] MRN: [id number] Date of Birth: Dec 01, 1950

## 2019-01-04 ENCOUNTER — Other Ambulatory Visit: Payer: Self-pay

## 2019-01-04 ENCOUNTER — Ambulatory Visit: Payer: Medicare Other | Attending: Hematology and Oncology | Admitting: Physical Therapy

## 2019-01-04 DIAGNOSIS — M6281 Muscle weakness (generalized): Secondary | ICD-10-CM | POA: Insufficient documentation

## 2019-01-04 DIAGNOSIS — M25511 Pain in right shoulder: Secondary | ICD-10-CM | POA: Diagnosis not present

## 2019-01-04 DIAGNOSIS — M25611 Stiffness of right shoulder, not elsewhere classified: Secondary | ICD-10-CM | POA: Diagnosis present

## 2019-01-04 DIAGNOSIS — L599 Disorder of the skin and subcutaneous tissue related to radiation, unspecified: Secondary | ICD-10-CM | POA: Insufficient documentation

## 2019-01-04 DIAGNOSIS — R293 Abnormal posture: Secondary | ICD-10-CM | POA: Diagnosis present

## 2019-01-04 NOTE — Therapy (Signed)
[place], [place], [address] Phone: [phone]   Fax:  [phone]  Physical Therapy Treatment  Patient Details  Name: Tami [name] MRN: [id number] Date of Birth: May 11, 1951 Referring Provider (PT): Dr. Lindi Adie   Encounter Date: 01/04/2019  PT End of Session - 01/04/19 1623    Visit Number  4    Date for PT Re-Evaluation  01/19/19    PT Start Time  1530    PT Stop Time  1610    PT Time Calculation (min)  40 min    Activity Tolerance  Patient tolerated treatment well    Behavior During Therapy  [facility] for tasks assessed/performed       Past Medical History:  Diagnosis Date  . Anemia    as a child  . Anxiety   . Arthritis   . Asthma   . Atrial fibrillation ([place])   . Breast cancer ([place]) 2015  . Depression with anxiety   . Dysrhythmia    hx AF  . Family history of malignant neoplasm of breast   . Fibromyalgia   . Heart palpitations   . Heartburn   . Hyperlipidemia   . 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 Irregular heartbeat   . Malignant neoplasm of breast (female), unspecified site   . Migraine   . Numbness of arm    Right Arm  . Osteoporosis   . Personal history of chemotherapy 2015   Right Breast Cancer  . Personal history of radiation therapy 2015   Right Breast Cancer  . Pure hypercholesterolemia   . SVT (supraventricular tachycardia) (HCC)     Past Surgical History:  Procedure Laterality Date  . 30 Day Monitor  04/10/2009 to 05/23/2009   No End Of Summary Report Atrial Fib,Narrow Complex Tachycardia,Sinus Rhythm   . ABDOMINAL HYSTERECTOMY  1987  . BREAST EXCISIONAL BIOPSY Left   . BREAST LUMPECTOMY Right 2015  . BREAST LUMPECTOMY WITH NEEDLE LOCALIZATION AND AXILLARY SENTINEL LYMPH NODE BX Right 04/02/2014   Procedure: Right Axillary Lymphatic Mapping; Right axillary Sentinal Node Biopsy; Right Partial Mastectomy after wire  localization;  Surgeon: Jackolyn Confer, MD;  Location: [facility];  Service: General;  Laterality: Right;  . BREAST SURGERY     Left  . CYSTOSCOPY  04/25/2012   Pt may be a candidate for research anti-incontinence procedure protocol  . Dipyridamole Myoview  05/27/2009   The post stress myocardial perfusion images show a normal pattern of perfusion in all regions.The post stress ejection fraction is 83 %. Normal  myocardial perfusion imaging This is a low risk scan.  . DOPPLER ECHOCARDIOGRAPHY  08/21/2010   Ejecion Fraction =>55% All chambers are normal in size and function No significant change from 2010.  [name] lower Venous Dopplers  04/11/2009   Normal bilateral lower ext venous duplex  . PORTACATH PLACEMENT N/A 04/26/2014   Procedure: ULTRASOUND GUIDED PORT A CATHE INSERTION;  Surgeon: Jackolyn Confer, MD;  Location[facility];  Service: General;  Laterality: N/A;  . Upper Arterial Dopplers  06/02/2011   Right Side PPG Demonstrates siginificantly diminshed waveforms with the Adson's maneuver which may suggest Thoracic Outlet Syndrone. This is a abnormal thoracic outlet arterial evaluation.    There were no vitals filed for this visit.  Subjective Assessment - 01/04/19 1532    Subjective  Pt states that she is feeling better.  She says she is still having shoulder pain  Pertinent History  Right breast cancer diagnosed 02/14/2014, lumpectomy with 3 nodes removed 04/02/2014 followed by chemotherapy with numbness and tingling in hands and feet that did resolve, followed by radiation.  She has not had problems with swelling in her arm in the past. currently on letrozole and has problems with joint pain and constipation  past  h/o back problems that contibute numbness and tingling in right leg,    Currently in Pain?  Yes    Pain Score  3     Pain Location  Shoulder    Pain Orientation  Right    Pain Descriptors / Indicators  Aching    Pain Type  Chronic pain         [facility] PT Assessment - 01/04/19  0001      AROM   Right Shoulder Flexion  150 Degrees                   [facility] Adult PT Treatment/Exercise - 01/04/19 0001      Lumbar Exercises: Standing   Other Standing Lumbar Exercises  standing with back against the wall and pelvic tilts for lumbar mobility       Shoulder Exercises: Seated   Other Seated Exercises  neck and upper thoracic range of motion in sitting       Shoulder Exercises: Standing   Row  Strengthening;Right;Left;10 reps;Theraband    Theraband Level (Shoulder Row)  Level 1 (Yellow)    Row Limitations  varied stances and bilaterally and alternating with cues for posture and core activation       Shoulder Exercises: Pulleys   Flexion  2 minutes    Scaption  2 minutes      Shoulder Exercises: Therapy Ball   Other Therapy Ball Exercises  soft roller up the wall for scapular movement       Shoulder Exercises: ROM/Strengthening   Wall Wash  with each arm to 12, return to center then same to 11, 10 9 oclock with cirdumduciton to top pt instructed to do this for home program              PT Education - 01/04/19 1620    Education Details  wall washes and therband standing rows with yellow theraband    Person(s) Educated  Patient    Methods  Explanation;Demonstration    Comprehension  Verbalized understanding;Returned demonstration          PT Long Term Goals - 01/04/19 1624      PT LONG TERM GOAL #1   Title  Right anterior shoulder pain willdecrease to 5/10    Baseline  9/10, pt states pain is 3/10 today    Status  Achieved      PT LONG TERM GOAL #2   Title  pt will be Independent in a home exercise program for shoulder range of motion and strength    Time  4    Period  Weeks    Status  On-going      PT LONG TERM GOAL #3   Title  Pt derease quick dash scoreto < 30  indicating an improvment in UE function    Baseline  59.09    Period  Weeks    Status  On-going      PT LONG TERM GOAL #4   Title  Pt will decrease  TUG to < 10  seconds demonstrating and improvement in general mobility    Baseline  12.58 sec on 12/21/2018    Period  Weeks    Status  On-going      PT LONG TERM GOAL #5   Title  Pt will verbalize lymphedema risk reduction practices and have decrease in left arm circumference at 15 cm proximal to the olecranon to 31 cm    Baseline  31.8    Time  4    Period  Weeks    Status  On-going            Plan - 01/04/19 1621    Clinical Impression Statement  Pt is feeling better overall. Focus today was on active movement and instruction in wall washes for home program as she is going to her daughters for a week    Comorbidities  previous breast surgery with lymph node removal, radiation and hormone treatment with side effect of joint pain, past back pain    PT Frequency  2x / week    PT Duration  4 weeks    PT Treatment/Interventions  ADLs/Self Care Home Management;Iontophoresis 4mg /ml Dexamethasone;Therapeutic exercise;Neuromuscular re-education;Patient/family education;Orthotic Fit/Training;Manual techniques;Manual lymph drainage;Compression bandaging;Passive range of motion;Taping;Joint Manipulations    PT Next Visit Plan  Reassess arm circumference and address lymphedema educaiton Progress with scapular mobility, active exercise and strengthening  cont soft tissue work and P/ROM to Rt upper quadrant; monitor Rt UE for lymphedmea and teach risk reduction practices. Teach core and LE strength for general HEP, encourage walking to help with back and joint pain    Consulted and Agree with Plan of Care  Patient       Patient will benefit from skilled therapeutic intervention in order to improve the following deficits and impairments:  Pain, Postural dysfunction, Impaired UE functional use, Increased fascial restricitons, Decreased strength, Decreased knowledge of use of DME, Decreased activity tolerance, Decreased knowledge of precautions, Decreased range of motion, Increased edema  Visit Diagnosis: 1.  Acute pain of right shoulder   2. Stiffness of right shoulder joint   3. Muscle weakness (generalized)   4. Abnormal posture   5. Disorder of the skin and subcutaneous tissue related to radiation, unspecified        Problem List Patient Active Problem List   Diagnosis Date Noted  . Pelvic and perineal pain 08/19/2017  . Right hip pain 07/26/2017  . Weakness 07/26/2017  . Gait abnormality 07/26/2017  . Depression 04/23/2016  . Nausea without vomiting 04/23/2016  . Trigger finger, acquired 03/26/2016  . Swelling of both ankles 12/18/2015  . First degree heart block 12/18/2015  . Abdominal pain   . Abnormal LFTs   . Oral candidiasis 05/14/2014  . Dehydration 05/14/2014  . Generalized pain 05/14/2014  . PAF (paroxysmal atrial fibrillation) ([place]) 05/14/2014  . PUD (peptic ulcer disease) 05/14/2014  . Transaminitis 05/14/2014  . Normocytic anemia 05/14/2014  . Epigastric abdominal pain 05/14/2014  . Family history of malignant neoplasm of breast   . Breast cancer of lower-inner quadrant of right female breast ([place]) 02/14/2014  . Hyperlipidemia 05/23/2013  . Caregiver stress 10/23/2012  . Osteoporosis, unspecified 08/24/2012  . DDD (degenerative disc disease), lumbar 08/23/2012  . Female stress incontinence 05/28/2012  . Hydronephrosis 05/28/2012  . Numbness and tingling of right arm and leg 08/23/2011  . Essential hypertension 09/14/2010  . Abdominal pain, epigastric 09/14/2010  . IRRITABLE BOWEL SYNDROME 12/29/2009  . OSTEOARTHRITIS 12/29/2009  . 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 PSVT 07/11/2008  . GERD 06/24/2000  . CANDIDIASIS, ESOPHAGEAL 04/19/1995  . ASTHMA UNSPECIFIED WITH EXACERBATION 04/19/1995   Donato Heinz. Owens Shark, PT  Norwood Levo 01/04/2019, 4:27 PM  [place], [place], [address] Phone: [phone]   Fax:  [phone]  Name: Tami [name] MRN: [id number] Date of  Birth: 1951/06/30

## 2019-01-09 ENCOUNTER — Encounter: Payer: Medicare Other | Admitting: Physical Therapy

## 2019-01-11 ENCOUNTER — Encounter: Payer: Medicare Other | Admitting: Physical Therapy

## 2019-01-15 ENCOUNTER — Ambulatory Visit: Payer: Medicare Other | Admitting: Physical Therapy

## 2019-01-15 ENCOUNTER — Other Ambulatory Visit: Payer: Self-pay

## 2019-01-15 ENCOUNTER — Encounter: Payer: Self-pay | Admitting: Physical Therapy

## 2019-01-15 DIAGNOSIS — M6281 Muscle weakness (generalized): Secondary | ICD-10-CM

## 2019-01-15 DIAGNOSIS — R293 Abnormal posture: Secondary | ICD-10-CM

## 2019-01-15 DIAGNOSIS — L599 Disorder of the skin and subcutaneous tissue related to radiation, unspecified: Secondary | ICD-10-CM

## 2019-01-15 DIAGNOSIS — M25511 Pain in right shoulder: Secondary | ICD-10-CM | POA: Diagnosis not present

## 2019-01-15 NOTE — Patient Instructions (Signed)

## 2019-01-15 NOTE — Therapy (Signed)
[place], [place], [address] Phone: [phone]   Fax:  [phone]  Physical Therapy Treatment  Patient Details  Name: Tami [name] MRN: [id number] Date of Birth: 1950/11/11 Referring Provider (PT): Dr. Lindi Adie   Encounter Date: 01/15/2019  PT End of Session - 01/15/19 1429    Visit Number  5    Number of Visits  9    Date for PT Re-Evaluation  01/19/19    PT Start Time  1332    PT Stop Time  1415    PT Time Calculation (min)  43 min    Activity Tolerance  Patient tolerated treatment well    Behavior During Therapy  [facility] for tasks assessed/performed       Past Medical History:  Diagnosis Date  . Anemia    as a child  . Anxiety   . Arthritis   . Asthma   . Atrial fibrillation ([place])   . Breast cancer ([place]) 2015  . Depression with anxiety   . Dysrhythmia    hx AF  . Family history of malignant neoplasm of breast   . Fibromyalgia   . Heart palpitations   . Heartburn   . Hyperlipidemia   . 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 Irregular heartbeat   . Malignant neoplasm of breast (female), unspecified site   . Migraine   . Numbness of arm    Right Arm  . Osteoporosis   . Personal history of chemotherapy 2015   Right Breast Cancer  . Personal history of radiation therapy 2015   Right Breast Cancer  . Pure hypercholesterolemia   . SVT (supraventricular tachycardia) (HCC)     Past Surgical History:  Procedure Laterality Date  . 30 Day Monitor  04/10/2009 to 05/23/2009   No End Of Summary Report Atrial Fib,Narrow Complex Tachycardia,Sinus Rhythm   . ABDOMINAL HYSTERECTOMY  1987  . BREAST EXCISIONAL BIOPSY Left   . BREAST LUMPECTOMY Right 2015  . BREAST LUMPECTOMY WITH NEEDLE LOCALIZATION AND AXILLARY SENTINEL LYMPH NODE BX Right 04/02/2014   Procedure: Right Axillary Lymphatic Mapping; Right axillary Sentinal Node Biopsy; Right Partial  Mastectomy after wire localization;  Surgeon: Jackolyn Confer, MD;  Location: [facility];  Service: General;  Laterality: Right;  . BREAST SURGERY     Left  . CYSTOSCOPY  04/25/2012   Pt may be a candidate for research anti-incontinence procedure protocol  . Dipyridamole Myoview  05/27/2009   The post stress myocardial perfusion images show a normal pattern of perfusion in all regions.The post stress ejection fraction is 83 %. Normal  myocardial perfusion imaging This is a low risk scan.  . DOPPLER ECHOCARDIOGRAPHY  08/21/2010   Ejecion Fraction =>55% All chambers are normal in size and function No significant change from 2010.  [name] lower Venous Dopplers  04/11/2009   Normal bilateral lower ext venous duplex  . PORTACATH PLACEMENT N/A 04/26/2014   Procedure: ULTRASOUND GUIDED PORT A CATHE INSERTION;  Surgeon: Jackolyn Confer, MD;  [facility];  Service: General;  Laterality: N/A;  . Upper Arterial Dopplers  06/02/2011   Right Side PPG Demonstrates siginificantly diminshed waveforms with the Adson's maneuver which may suggest Thoracic Outlet Syndrone. This is a abnormal thoracic outlet arterial evaluation.    There were no vitals filed for this visit.  Subjective Assessment - 01/15/19 1334    Subjective  I did all the exercises that Helene Kelp showed me and I  am doing much better. I was able to go up and down the steps everytime I wanted to.    Pertinent History  Right breast cancer diagnosed 02/14/2014, lumpectomy with 3 nodes removed 04/02/2014 followed by chemotherapy with numbness and tingling in hands and feet that did resolve, followed by radiation.  She has not had problems with swelling in her arm in the past. currently on letrozole and has problems with joint pain and constipation  past  h/o back problems that contibute numbness and tingling in right leg,    Currently in Pain?  No/denies    Pain Score  0-No pain         [facility] PT Assessment - 01/15/19 0001      AROM   Right Shoulder  Flexion  142 Degrees    Right Shoulder ABduction  142 Degrees        LYMPHEDEMA/ONCOLOGY QUESTIONNAIRE - 01/15/19 1335      Right Upper Extremity Lymphedema   10 cm Proximal to Olecranon Process  33.2 cm    Olecranon Process  27.4 cm    15 cm Proximal to Ulnar Styloid Process  25.8 cm    Just Proximal to Ulnar Styloid Process  17 cm    Across Hand at [employer]  20.1 cm    At [place] of 2nd Digit  6.9 cm                [facility] Adult PT Treatment/Exercise - 01/15/19 0001      Knee/Hip Exercises: Stretches   Other Knee/Hip Stretches  Instructed pt in a variety of hip adductor stretches since pt reports having spasms in this area often. She was much more tight on the right than the left. In supine hooklying allowing knees to fall out to sides x 1 - 2 min. Pt felt a good stretch with this and reports it got better the longer she held the stretch.       Manual Therapy   Manual Therapy  Manual Lymphatic Drainage (MLD);Soft tissue mobilization;Passive ROM    Soft tissue mobilization  while performing PROM to R shoulder began STM to lateral border of scapula and inferior axilla in area of trigger point and tightness, pt very tender here    Manual Lymphatic Drainage (MLD)  instructed pt in MLD for R UE in supine, issued handout and had pt return demonstrate the following: 5 diaphragmatic breaths, left axillary nodes and establishment of  interaxillary pathway, right inguinal nodes and establishment of axillo inguinal pathway then R UE working proximal to distal then retracing all steps. Provided verbal and tactile cues throughout for correct sequence and hand technique.    Passive ROM  to R shoulder in direction of flexion and abduction with prolonged holds                  PT Long Term Goals - 01/04/19 1624      PT LONG TERM GOAL #1   Title  Right anterior shoulder pain willdecrease to 5/10    Baseline  9/10, pt states pain is 3/10 today    Status  Achieved      PT  LONG TERM GOAL #2   Title  pt will be Independent in a home exercise program for shoulder range of motion and strength    Time  4    Period  Weeks    Status  On-going      PT LONG TERM GOAL #3   Title  Pt  derease quick dash scoreto < 30  indicating an improvment in UE function    Baseline  59.09    Period  Weeks    Status  On-going      PT LONG TERM GOAL #4   Title  Pt will decrease  TUG to < 10 seconds demonstrating and improvement in general mobility    Baseline  12.58 sec on 12/21/2018    Period  Weeks    Status  On-going      PT LONG TERM GOAL #5   Title  Pt will verbalize lymphedema risk reduction practices and have decrease in left arm circumference at 15 cm proximal to the olecranon to 31 cm    Baseline  31.8    Time  4    Period  Weeks    Status  On-going            Plan - 01/15/19 1429    Clinical Impression Statement  Reassessed pt's curcumferential measurements today and her R upper arm had increased since last session and was more than 2 cm greater than her left though pt did not feel like her arm was swelling. Instructed her in self MLD for her R UE and had her return demonstrate. Sent Rx to her dr for a compression sleeve. Instructed pt in hip adductor stretch since pt has increased tightness with this. Continued with PROM to R shoulder. Pt has signifant tightness and trigger points at lateral border of scapula and inferior axilla and would benefit from continued soft tissue mobilization to this area.    Stability/Clinical Decision Making  Evolving/Moderate complexity    Rehab Potential  Good    PT Frequency  2x / week    PT Duration  4 weeks    PT Treatment/Interventions  ADLs/Self Care Home Management;Iontophoresis 4mg /ml Dexamethasone;Therapeutic exercise;Neuromuscular re-education;Patient/family education;Orthotic Fit/Training;Manual techniques;Manual lymph drainage;Compression bandaging;Passive range of motion;Taping;Joint Manipulations    PT Next Visit Plan   assess indep with self MLD, check for signed Rx, continue STM to R lateral border of scapula and inferior axilla,  Progress with scapular mobility, active exercise and strengthening  cont soft tissue work and P/ROM to Rt upper quadrant; monitor Rt UE for lymphedmea and teach risk reduction practices. Teach core and LE strength for general HEP, encourage walking to help with back and joint pain    Consulted and Agree with Plan of Care  Patient       Patient will benefit from skilled therapeutic intervention in order to improve the following deficits and impairments:  Pain, Postural dysfunction, Impaired UE functional use, Increased fascial restricitons, Decreased strength, Decreased knowledge of use of DME, Decreased activity tolerance, Decreased knowledge of precautions, Decreased range of motion, Increased edema  Visit Diagnosis: 1. Acute pain of right shoulder   2. Muscle weakness (generalized)   3. Abnormal posture   4. Disorder of the skin and subcutaneous tissue related to radiation, unspecified        Problem List Patient Active Problem List   Diagnosis Date Noted  . Pelvic and perineal pain 08/19/2017  . Right hip pain 07/26/2017  . Weakness 07/26/2017  . Gait abnormality 07/26/2017  . Depression 04/23/2016  . Nausea without vomiting 04/23/2016  . Trigger finger, acquired 03/26/2016  . Swelling of both ankles 12/18/2015  . First degree heart block 12/18/2015  . Abdominal pain   . Abnormal LFTs   . Oral candidiasis 05/14/2014  . Dehydration 05/14/2014  . Generalized pain 05/14/2014  . PAF (paroxysmal atrial  fibrillation) ([place]) 05/14/2014  . PUD (peptic ulcer disease) 05/14/2014  . Transaminitis 05/14/2014  . Normocytic anemia 05/14/2014  . Epigastric abdominal pain 05/14/2014  . Family history of malignant neoplasm of breast   . Breast cancer of lower-inner quadrant of right female breast ([place]) 02/14/2014  . Hyperlipidemia 05/23/2013  . Caregiver stress 10/23/2012  .  Osteoporosis, unspecified 08/24/2012  . DDD (degenerative disc disease), lumbar 08/23/2012  . Female stress incontinence 05/28/2012  . Hydronephrosis 05/28/2012  . Numbness and tingling of right arm and leg 08/23/2011  . Essential hypertension 09/14/2010  . Abdominal pain, epigastric 09/14/2010  . IRRITABLE BOWEL SYNDROME 12/29/2009  . OSTEOARTHRITIS 12/29/2009  . 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 PSVT 07/11/2008  . GERD 06/24/2000  . CANDIDIASIS, ESOPHAGEAL 04/19/1995  . ASTHMA UNSPECIFIED WITH EXACERBATION 04/19/1995    Allyson Sabal [facility] 01/15/2019, 2:34 PM  [place], [place], [address] Phone: [phone]   Fax:  [phone]  Name: Tami [name] MRN: [id number] Date of Birth: 05-Mar-1951  Manus Gunning, PT 01/15/19 2:34 PM

## 2019-01-17 ENCOUNTER — Encounter: Payer: Self-pay | Admitting: Physical Therapy

## 2019-01-17 ENCOUNTER — Other Ambulatory Visit: Payer: Self-pay

## 2019-01-17 ENCOUNTER — Ambulatory Visit: Payer: Medicare Other | Admitting: Physical Therapy

## 2019-01-17 DIAGNOSIS — L599 Disorder of the skin and subcutaneous tissue related to radiation, unspecified: Secondary | ICD-10-CM

## 2019-01-17 DIAGNOSIS — M25511 Pain in right shoulder: Secondary | ICD-10-CM

## 2019-01-17 DIAGNOSIS — R293 Abnormal posture: Secondary | ICD-10-CM

## 2019-01-17 DIAGNOSIS — M25611 Stiffness of right shoulder, not elsewhere classified: Secondary | ICD-10-CM

## 2019-01-17 DIAGNOSIS — M6281 Muscle weakness (generalized): Secondary | ICD-10-CM

## 2019-01-17 NOTE — Therapy (Signed)
[place], [place], [address] Phone: [phone]   Fax:  [phone]  Physical Therapy Treatment  Patient Details  Name: Tami [name] MRN: [id number] Date of Birth: March 09, 1951 Referring Provider (PT): Dr. Lindi Adie   Encounter Date: 01/17/2019  PT End of Session - 01/17/19 1639    Visit Number  6    Number of Visits  17    Date for PT Re-Evaluation  02/19/19    PT Start Time  1330    PT Stop Time  1415    PT Time Calculation (min)  45 min    Activity Tolerance  Patient tolerated treatment well    Behavior During Therapy  [facility] for tasks assessed/performed       Past Medical History:  Diagnosis Date  . Anemia    as a child  . Anxiety   . Arthritis   . Asthma   . Atrial fibrillation ([place])   . Breast cancer ([facility]) 2015  . Depression with anxiety   . Dysrhythmia    hx AF  . Family history of malignant neoplasm of breast   . Fibromyalgia   . Heart palpitations   . Heartburn   . Hyperlipidemia   . 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 Irregular heartbeat   . Malignant neoplasm of breast (female), unspecified site   . Migraine   . Numbness of arm    Right Arm  . Osteoporosis   . Personal history of chemotherapy 2015   Right Breast Cancer  . Personal history of radiation therapy 2015   Right Breast Cancer  . Pure hypercholesterolemia   . SVT (supraventricular tachycardia) (HCC)     Past Surgical History:  Procedure Laterality Date  . 30 Day Monitor  04/10/2009 to 05/23/2009   No End Of Summary Report Atrial Fib,Narrow Complex Tachycardia,Sinus Rhythm   . ABDOMINAL HYSTERECTOMY  1987  . BREAST EXCISIONAL BIOPSY Left   . BREAST LUMPECTOMY Right 2015  . BREAST LUMPECTOMY WITH NEEDLE LOCALIZATION AND AXILLARY SENTINEL LYMPH NODE BX Right 04/02/2014   Procedure: Right Axillary Lymphatic Mapping; Right axillary Sentinal Node Biopsy; Right Partial  Mastectomy after wire localization;  Surgeon: Jackolyn Confer, MD;  Location: [place];  Service: General;  Laterality: Right;  . BREAST SURGERY     Left  . CYSTOSCOPY  04/25/2012   Pt may be a candidate for research anti-incontinence procedure protocol  . Dipyridamole Myoview  05/27/2009   The post stress myocardial perfusion images show a normal pattern of perfusion in all regions.The post stress ejection fraction is 83 %. Normal  myocardial perfusion imaging This is a low risk scan.  . DOPPLER ECHOCARDIOGRAPHY  08/21/2010   Ejecion Fraction =>55% All chambers are normal in size and function No significant change from 2010.  [name] lower Venous Dopplers  04/11/2009   Normal bilateral lower ext venous duplex  . PORTACATH PLACEMENT N/A 04/26/2014   Procedure: ULTRASOUND GUIDED PORT A CATHE INSERTION;  Surgeon: Jackolyn Confer, MD;  [facility];  Service: General;  Laterality: N/A;  . Upper Arterial Dopplers  06/02/2011   Right Side PPG Demonstrates siginificantly diminshed waveforms with the Adson's maneuver which may suggest Thoracic Outlet Syndrone. This is a abnormal thoracic outlet arterial evaluation.    There were no vitals filed for this visit.  Subjective Assessment - 01/17/19 1340    Subjective  Pt states that she is having a little bit of pain  in anterior shoulder today.  She says that she usually gets the pain in her shoulders if she sleeps on that side.  We discuseed pt getting a compression sleeve and glove to be paid for by [employer]    Pertinent History  Right breast cancer diagnosed 02/14/2014, lumpectomy with 3 nodes removed 04/02/2014 followed by chemotherapy with numbness and tingling in hands and feet that did resolve, followed by radiation.  She has not had problems with swelling in her arm in the past. currently on letrozole and has problems with joint pain and constipation  past  h/o back problems that contibute numbness and tingling in right leg,    Currently in Pain?  Yes     Pain Score  4     Pain Location  Chest    Pain Orientation  Right    Pain Descriptors / Indicators  Aching    Pain Type  Chronic pain    Pain Onset  More than a month ago    Pain Frequency  Intermittent         [facility] PT Assessment - 01/17/19 0001      Assessment   Medical Diagnosis  right breast cancer     Referring Provider (PT)  Dr. Lindi Adie    Onset Date/Surgical Date  02/14/14      Prior Function   Level of Independence  Independent with basic ADLs   husband helps her wash her back, diffuculty with bathing     Observation/Other Assessments   Quick DASH   36.36      Timed Up and Go Test   Normal TUG (seconds)  10.75        LYMPHEDEMA/ONCOLOGY QUESTIONNAIRE - 01/17/19 1345      Right Upper Extremity Lymphedema   10 cm Proximal to Olecranon Process  32.5 cm        Quick Dash - 01/17/19 0001    Open a tight or new jar  Severe difficulty    Carry a shopping bag or briefcase  Mild difficulty    Wash your back  Unable    Use a knife to cut food  Mild difficulty    Recreational activities in which you take some force or impact through your arm, shoulder, or hand (golf, hammering, tennis)  Mild difficulty    During the past week, to what extent has your arm, shoulder or hand problem interfered with your normal social activities with family, friends, neighbors, or groups?  Modererately    During the past week, to what extent has your arm, shoulder or hand problem limited your work or other regular daily activities  Modererately    Arm, shoulder, or hand pain.  Moderate    Tingling (pins and needles) in your arm, shoulder, or hand  Mild    Difficulty Sleeping  No difficulty    DASH Score  36.36 %             [facility] Adult PT Treatment/Exercise - 01/17/19 0001      Exercises   Exercises  Shoulder;Lumbar;Knee/Hip      Knee/Hip Exercises: Sidelying   Hip ABduction  AROM;Right;Left;5 reps    Clams  AROM of both legs with 5 reps     Other Sidelying Knee/Hip  Exercises  small circles of  each leg raised a few inches       Shoulder Exercises: Sidelying   External Rotation  AROM;Both;5 reps    Flexion  AROM;Right;Left;5 reps    ABduction  AROM;Right;Left;Both  Other Sidelying Exercises  small circles 5 reps in each direction                   PT Long Term Goals - 01/17/19 1428      PT LONG TERM GOAL #1   Title  Right anterior shoulder pain will decrease to 5/10    Baseline  9/10, pt states pain is 3/10 today    Status  Achieved      PT LONG TERM GOAL #2   Title  pt will be Independent in a home exercise program for shoulder range of motion and strength    Time  4    Period  Weeks    Status  On-going      PT LONG TERM GOAL #3   Title  Pt derease quick dash scoreto < 30  indicating an improvment in UE function    Baseline  59.09 at eval, 36.36 on 01/17/2019    Time  4    Status  On-going      PT LONG TERM GOAL #4   Title  Pt will decrease  TUG to < 10 seconds demonstrating and improvement in general mobility    Baseline  12.58 sec on 12/21/2018, 10.75 on 01/17/2019    Time  4    Period  Weeks    Status  On-going      PT LONG TERM GOAL #5   Title  Pt will verbalize lymphedema risk reduction practices and have decrease in left arm circumference at 15 cm proximal to the olecranon to 31 cm    Baseline  31.8 at eval, 33.5 on 7/15    Time  4    Period  Weeks    Status  On-going            Plan - 01/17/19 1640    Clinical Impression Statement  Reassessment done for goals today.  Pt still has some increased in right upper arm compared to eval, with pain in right anterior chest and shoulder.  She has trigger points to these areas but does not respond to manual work as it tends to cause her more pain.  Focused on active exercise today and pt is doing well with that. Renewal sent for more sessions as pt is still improving and has potential for more improvement to meet goals.    Comorbidities  previous breast surgery with  lymph node removal, radiation and hormone treatment with side effect of joint pain, past back pain    Examination-Activity Limitations  Bathing;Dressing;Transfers;Locomotion Level    PT Treatment/Interventions  ADLs/Self Care Home Management;Iontophoresis 4mg /ml Dexamethasone;Therapeutic exercise;Neuromuscular re-education;Patient/family education;Orthotic Fit/Training;Manual techniques;Manual lymph drainage;Compression bandaging;Passive range of motion;Taping;Joint Manipulations    PT Next Visit Plan  assess indep with self MLD, check for signed Rx, continue STM to R lateral border of scapula and inferior axilla,  Progress with scapular mobility, active exercise and strengthening  cont soft tissue work and P/ROM to Rt upper quadrant; monitor Rt UE for lymphedmea and teach risk reduction practices. Teach core and LE strength for general HEP, encourage walking to help with back and joint pain    Consulted and Agree with Plan of Care  Patient       Patient will benefit from skilled therapeutic intervention in order to improve the following deficits and impairments:  Pain, Postural dysfunction, Impaired UE functional use, Increased fascial restricitons, Decreased strength, Decreased knowledge of use of DME, Decreased activity tolerance, Decreased knowledge of precautions, Decreased range of  motion, Increased edema  Visit Diagnosis: 1. Acute pain of right shoulder   2. Muscle weakness (generalized)   3. Abnormal posture   4. Disorder of the skin and subcutaneous tissue related to radiation, unspecified   5. Stiffness of right shoulder joint        Problem List Patient Active Problem List   Diagnosis Date Noted  . Pelvic and perineal pain 08/19/2017  . Right hip pain 07/26/2017  . Weakness 07/26/2017  . Gait abnormality 07/26/2017  . Depression 04/23/2016  . Nausea without vomiting 04/23/2016  . Trigger finger, acquired 03/26/2016  . Swelling of both ankles 12/18/2015  . First degree heart  block 12/18/2015  . Abdominal pain   . Abnormal LFTs   . Oral candidiasis 05/14/2014  . Dehydration 05/14/2014  . Generalized pain 05/14/2014  . PAF (paroxysmal atrial fibrillation) ([place]) 05/14/2014  . PUD (peptic ulcer disease) 05/14/2014  . Transaminitis 05/14/2014  . Normocytic anemia 05/14/2014  . Epigastric abdominal pain 05/14/2014  . Family history of malignant neoplasm of breast   . Breast cancer of lower-inner quadrant of right female breast ([place]) 02/14/2014  . Hyperlipidemia 05/23/2013  . Caregiver stress 10/23/2012  . Osteoporosis, unspecified 08/24/2012  . DDD (degenerative disc disease), lumbar 08/23/2012  . Female stress incontinence 05/28/2012  . Hydronephrosis 05/28/2012  . Numbness and tingling of right arm and leg 08/23/2011  . Essential hypertension 09/14/2010  . Abdominal pain, epigastric 09/14/2010  . IRRITABLE BOWEL SYNDROME 12/29/2009  . OSTEOARTHRITIS 12/29/2009  . 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 PSVT 07/11/2008  . GERD 06/24/2000  . CANDIDIASIS, ESOPHAGEAL 04/19/1995  . ASTHMA UNSPECIFIED WITH EXACERBATION 04/19/1995   Donato Heinz. Owens Shark PT  Norwood Levo 01/17/2019, 4:45 PM  [place], [place], [address] Phone: [phone]   Fax:  [phone]  Name: Tami [name] MRN: [id number] Date of Birth: [birth year]

## 2019-01-17 NOTE — Patient Instructions (Signed)
Pt given hand written handout for these exercises as Epic is slow today    Pelvic tilts Sidelying arm abduction and small circles  Sidelying leg abduction and small circles  Supine 4 part straight leg raises

## 2019-01-22 ENCOUNTER — Ambulatory Visit: Payer: Medicare Other | Admitting: Physical Therapy

## 2019-01-22 ENCOUNTER — Other Ambulatory Visit: Payer: Self-pay

## 2019-01-22 ENCOUNTER — Encounter: Payer: Self-pay | Admitting: Physical Therapy

## 2019-01-22 DIAGNOSIS — M25511 Pain in right shoulder: Secondary | ICD-10-CM

## 2019-01-22 DIAGNOSIS — R293 Abnormal posture: Secondary | ICD-10-CM

## 2019-01-22 DIAGNOSIS — M6281 Muscle weakness (generalized): Secondary | ICD-10-CM

## 2019-01-22 DIAGNOSIS — L599 Disorder of the skin and subcutaneous tissue related to radiation, unspecified: Secondary | ICD-10-CM

## 2019-01-22 NOTE — Therapy (Addendum)
[place], [place], [address] Phone: [phone]   Fax:  [phone]  Physical Therapy Treatment  Patient Details  Name: Tami [name] MRN: [id number] Date of Birth: [birth year] Referring Provider (PT): Dr. Lindi Adie   Encounter Date: 01/22/2019  PT End of Session - 01/22/19 1427    Visit Number  7    Number of Visits  17    Date for PT Re-Evaluation  02/19/19    PT Start Time  1330    PT Stop Time  1410    PT Time Calculation (min)  40 min    Activity Tolerance  Patient tolerated treatment well    Behavior During Therapy  [facility] for tasks assessed/performed       Past Medical History:  Diagnosis Date  . Anemia    as a child  . Anxiety   . Arthritis   . Asthma   . Atrial fibrillation ([place])   . Breast cancer ([place]) 2015  . Depression with anxiety   . Dysrhythmia    hx AF  . Family history of malignant neoplasm of breast   . Fibromyalgia   . Heart palpitations   . Heartburn   . Hyperlipidemia   . 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 Irregular heartbeat   . Malignant neoplasm of breast (female), unspecified site   . Migraine   . Numbness of arm    Right Arm  . Osteoporosis   . Personal history of chemotherapy 2015   Right Breast Cancer  . Personal history of radiation therapy 2015   Right Breast Cancer  . Pure hypercholesterolemia   . SVT (supraventricular tachycardia) (HCC)     Past Surgical History:  Procedure Laterality Date  . 30 Day Monitor  04/10/2009 to 05/23/2009   No End Of Summary Report Atrial Fib,Narrow Complex Tachycardia,Sinus Rhythm   . ABDOMINAL HYSTERECTOMY  1987  . BREAST EXCISIONAL BIOPSY Left   . BREAST LUMPECTOMY Right 2015  . BREAST LUMPECTOMY WITH NEEDLE LOCALIZATION AND AXILLARY SENTINEL LYMPH NODE BX Right 04/02/2014   Procedure: Right Axillary Lymphatic Mapping; Right axillary Sentinal Node Biopsy; Right Partial  Mastectomy after wire localization;  Surgeon: Jackolyn Confer, MD;  Location: [place];  Service: General;  Laterality: Right;  . BREAST SURGERY     Left  . CYSTOSCOPY  04/25/2012   Pt may be a candidate for research anti-incontinence procedure protocol  . Dipyridamole Myoview  05/27/2009   The post stress myocardial perfusion images show a normal pattern of perfusion in all regions.The post stress ejection fraction is 83 %. Normal  myocardial perfusion imaging This is a low risk scan.  . DOPPLER ECHOCARDIOGRAPHY  08/21/2010   Ejecion Fraction =>55% All chambers are normal in size and function No significant change from 2010.  [name] lower Venous Dopplers  04/11/2009   Normal bilateral lower ext venous duplex  . PORTACATH PLACEMENT N/A 04/26/2014   Procedure: ULTRASOUND GUIDED PORT A CATHE INSERTION;  Surgeon: Jackolyn Confer, MD;  [facility];  Service: General;  Laterality: N/A;  . Upper Arterial Dopplers  06/02/2011   Right Side PPG Demonstrates siginificantly diminshed waveforms with the Adson's maneuver which may suggest Thoracic Outlet Syndrone. This is a abnormal thoracic outlet arterial evaluation.    There were no vitals filed for this visit.  Subjective Assessment - 01/22/19 1425    Subjective  Pt reports she is doing home exercise and has some questions  about how to do the MLD    Pertinent History  Right breast cancer diagnosed 02/14/2014, lumpectomy with 3 nodes removed 04/02/2014 followed by chemotherapy with numbness and tingling in hands and feet that did resolve, followed by radiation.  She has not had problems with swelling in her arm in the past. currently on letrozole and has problems with joint pain and constipation  past  h/o back problems that contibute numbness and tingling in right leg,    Currently in Pain?  No/denies   she did have some pain at right lateral chest to palpation                      [facility] Adult PT Treatment/Exercise - 01/22/19 1401       Exercises   Exercises  Other Exercises    Other Exercises   answered quetions about home exercise program. Pt states she is doing well and thinks the sidelying exercise will be helpful       Lumbar Exercises: Standing   Other Standing Lumbar Exercises  hinge exercise with hands on wall and and also with 2 hands on armrest of chair of bed to reach back with hips to stretch through back       Manual Therapy   Manual Therapy  Edema management    Manual therapy comments  pt was fitted for compression sleeve and glove to be paid for by [employer] today     Edema Management  provided small dotted foam to wear inside bra at right lateral chest and gave information to get a more permanent swell spot from compression guru if pt finds it helpful.     Manual Lymphatic Drainage (MLD)  instructed pt in MLD for R UE in supine, issued handout and had pt return demonstrate the following: 5 diaphragmatic breaths, left axillary nodes and establishment of  interaxillary pathway, right inguinal nodes and establishment of axillo inguinal pathway then R UE working proximal to distal then retracing all steps. Provided verbal and tactile cues throughout for correct sequence and hand technique.                  PT Long Term Goals - 01/17/19 1428      PT LONG TERM GOAL #1   Title  Right anterior shoulder pain will decrease to 5/10    Baseline  9/10, pt states pain is 3/10 today    Status  Achieved      PT LONG TERM GOAL #2   Title  pt will be Independent in a home exercise program for shoulder range of motion and strength    Time  4    Period  Weeks    Status  On-going      PT LONG TERM GOAL #3   Title  Pt derease quick dash scoreto < 30  indicating an improvment in UE function    Baseline  59.09 at eval, 36.36 on 01/17/2019    Time  4    Status  On-going      PT LONG TERM GOAL #4   Title  Pt will decrease  TUG to < 10 seconds demonstrating and improvement in general mobility    Baseline  12.58 sec  on 12/21/2018, 10.75 on 01/17/2019    Time  4    Period  Weeks    Status  On-going      PT LONG TERM GOAL #5   Title  Pt will verbalize lymphedema risk reduction practices  and have decrease in left arm circumference at 15 cm proximal to the olecranon to 31 cm    Baseline  31.8 at eval, 33.5 on 7/15    Time  4    Period  Weeks    Status  On-going            Plan - 01/22/19 1428    Clinical Impression Statement  Pt is doing much better. All questions about MLD were answered.  She wants to try it on her own for a couple weeks and will come back when her sleeve arrives to make sure it fits ok    Comorbidities  previous breast surgery with lymph node removal, radiation and hormone treatment with side effect of joint pain, past back pain    Examination-Activity Limitations  Bathing;Dressing;Transfers;Locomotion Level    Rehab Potential  Good    PT Duration  4 weeks    PT Treatment/Interventions  ADLs/Self Care Home Management;Iontophoresis 12m/ml Dexamethasone;Therapeutic exercise;Neuromuscular re-education;Patient/family education;Orthotic Fit/Training;Manual techniques;Manual lymph drainage;Compression bandaging;Passive range of motion;Taping;Joint Manipulations    PT Next Visit Plan  check sleeve reassess.  progress treatment if needed or discharge    Consulted and Agree with Plan of Care  Patient       Patient will benefit from skilled therapeutic intervention in order to improve the following deficits and impairments:  Pain, Postural dysfunction, Impaired UE functional use, Increased fascial restricitons, Decreased strength, Decreased knowledge of use of DME, Decreased activity tolerance, Decreased knowledge of precautions, Decreased range of motion, Increased edema  Visit Diagnosis: 1. Acute pain of right shoulder   2. Muscle weakness (generalized)   3. Abnormal posture   4. Disorder of the skin and subcutaneous tissue related to radiation, unspecified   5. Stiffness of right  shoulder joint        Problem List Patient Active Problem List   Diagnosis Date Noted  . Pelvic and perineal pain 08/19/2017  . Right hip pain 07/26/2017  . Weakness 07/26/2017  . Gait abnormality 07/26/2017  . Depression 04/23/2016  . Nausea without vomiting 04/23/2016  . Trigger finger, acquired 03/26/2016  . Swelling of both ankles 12/18/2015  . First degree heart block 12/18/2015  . Abdominal pain   . Abnormal LFTs   . Oral candidiasis 05/14/2014  . Dehydration 05/14/2014  . Generalized pain 05/14/2014  . PAF (paroxysmal atrial fibrillation) ([place] 05/14/2014  . PUD (peptic ulcer disease) 05/14/2014  . Transaminitis 05/14/2014  . Normocytic anemia 05/14/2014  . Epigastric abdominal pain 05/14/2014  . Family history of malignant neoplasm of breast   . Breast cancer of lower-inner quadrant of right female breast ([place] 02/14/2014  . Hyperlipidemia 05/23/2013  . Caregiver stress 10/23/2012  . Osteoporosis, unspecified 08/24/2012  . DDD (degenerative disc disease), lumbar 08/23/2012  . Female stress incontinence 05/28/2012  . Hydronephrosis 05/28/2012  . Numbness and tingling of right arm and leg 08/23/2011  . Essential hypertension 09/14/2010  . Abdominal pain, epigastric 09/14/2010  . IRRITABLE BOWEL SYNDROME 12/29/2009  . OSTEOARTHRITIS 12/29/2009  . 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 PSVT 07/11/2008  . GERD 06/24/2000  . CANDIDIASIS, ESOPHAGEAL 04/19/1995  . ASTHMA UNSPECIFIED WITH EXACERBATION 04/19/1995   TDonato Heinz BOwens SharkPT  BNorwood Levo7/20/2020, 2:31 PM  [place] [address]Phone: [phone]  Fax:  [phone] Name: PKyndall AmeroMRN: [id number] of Birth: [birth year] PHYSICAL THERAPY DISCHARGE SUMMARY  Visits from Start of Care: 7  Current functional level related to goals / functional outcomes: unknown   Remaining deficits: unknown    Education / Equipment: Home exercise, self care  Plan: Patient agrees to discharge.  Patient goals were partially met. Patient is being discharged due to not returning since the last visit.  ?????    Maudry Diego, PT 08/06/19 10:51 AM

## 2019-01-24 ENCOUNTER — Ambulatory Visit: Payer: Medicare Other | Admitting: Physical Therapy

## 2019-02-14 ENCOUNTER — Other Ambulatory Visit: Payer: Self-pay | Admitting: Hematology and Oncology

## 2019-03-09 ENCOUNTER — Other Ambulatory Visit: Payer: Self-pay | Admitting: Hematology and Oncology

## 2019-03-09 DIAGNOSIS — Z1231 Encounter for screening mammogram for malignant neoplasm of breast: Secondary | ICD-10-CM

## 2019-03-09 DIAGNOSIS — Z853 Personal history of malignant neoplasm of breast: Secondary | ICD-10-CM

## 2019-03-30 ENCOUNTER — Telehealth: Payer: Self-pay | Admitting: *Deleted

## 2019-03-30 ENCOUNTER — Encounter: Payer: Self-pay | Admitting: *Deleted

## 2019-03-30 NOTE — Progress Notes (Signed)
Received fax from [facility] for oncology clearance for pt to have a tooth extracted.  Paperwork completed and sent back to [phone].

## 2019-03-30 NOTE — Telephone Encounter (Signed)
Received call from pt stating she was needing oral surgery to be preformed and put under anesthesia to have a tooth extracted.  Pt states oral surgeon at [facility] is needing Dr. Geralyn Flash approval to proceed with tx. Per Dr. Lindi Adie pt okay to have oral surgery done under anesthesia.   RN placed call to [facility] [phone]) and spoke with the receptionist who requested our direct fax number to send information for Dr. Lindi Adie to fill out.  Fax number and direct phone number provided.

## 2019-04-03 ENCOUNTER — Telehealth: Payer: Self-pay

## 2019-04-03 ENCOUNTER — Other Ambulatory Visit: Payer: Self-pay | Admitting: Cardiovascular Disease

## 2019-04-03 NOTE — Telephone Encounter (Signed)
Request for surgical clearance:  1. What type of surgery is being performed? EXTRACTION OF ONE TOOTH   2. When is this surgery scheduled? TBD   3. What type of clearance is required (medical clearance vs. Pharmacy clearance to hold med vs. Both)? MEDICAL  4. Are there any medications that need to be held prior to surgery and how long? NO   5. Practice name and name of physician performing surgery? [facility]   6. What is your office phone number [phone]    7.   What is your office fax number [phone]  8.   Anesthesia type (None, local, MAC, general) ? GENERAL  NOTE: WE DO NOT SUGGEST THE PATIENT HOLD ANY MEDICATIONS;THIS IS UP TO THE PRESCRIBING DOCTOR.

## 2019-04-03 NOTE — Telephone Encounter (Signed)
   Primary Cardiologist: Shelva Majestic, MD  Chart reviewed as part of pre-operative protocol coverage. Simple dental extractions are considered low risk procedures per guidelines and generally do not require any specific cardiac clearance. It is also generally accepted that for simple extractions and dental cleanings, there is no need to interrupt blood thinner therapy.   SBE prophylaxis is not required for the patient.  I will route this recommendation to the requesting party via Epic fax function and remove from pre-op pool.  Please call with questions.  Ledora Bottcher, PA 04/03/2019, 12:39 PM

## 2019-04-10 ENCOUNTER — Ambulatory Visit
Admission: RE | Admit: 2019-04-10 | Discharge: 2019-04-10 | Disposition: A | Discharge status: Home / Self Care | Payer: Medicare Other | Source: Ambulatory Visit | Attending: Hematology and Oncology | Admitting: Hematology and Oncology

## 2019-04-10 ENCOUNTER — Other Ambulatory Visit: Payer: Self-pay

## 2019-04-10 DIAGNOSIS — Z1231 Encounter for screening mammogram for malignant neoplasm of breast: Secondary | ICD-10-CM

## 2019-04-10 DIAGNOSIS — Z853 Personal history of malignant neoplasm of breast: Secondary | ICD-10-CM

## 2019-05-08 NOTE — Progress Notes (Signed)
Cardiology Office Note   Date:  05/09/2019   ID:  Tami [name], DOB [birth year], MRN [id number]  PCP:  Patient, No Pcp Per  Cardiologist:  Shelva Majestic, MD EP: None  Chief Complaint  Patient presents with   Follow-up    paroxysmal atrial fibrillation      History of Present Illness: Tami [name] is a 68 y.o. female with a PMH of paroxysmal atrial fibrillation, SVT, breast cancer s/p lumpectomy/radiation/chemo, who presents for routine follow-up.  She was last evaluated by cardiology at an outpatient visit with Dr. Claiborne Billings 02/2018, at which time she was maintaining sinus rhythm with flecainide for PAF/SVT and her BP and cholesterol were well controlled. Her last echo in 2015 showed EF 60-65% without significant valvular abnormalities.   She presents today for routine follow-up. She reports doing well from a cardiac standpoint. She had an isolated episode of palpitations ~1 month ago with a racing heart beat and lightheadedness after she forgot to take her medications that day. The episode resolved in 30 minutes after taking her meds. Otherwise no complaints of chest pain, SOB, DOE, dizziness, lightheadedness, or syncope. Her blood pressure is elevated today but she does not monitor this at home. She does report an affinity for salt. She is not eager to start a new medication and would like to trial dietary/lifestyle modifications to lower BP at this time.     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   Lower Inner Quadrant   Irregular heartbeat    Malignant neoplasm of breast (female), unspecified site    Migraine     Numbness of arm    Right Arm   Osteoporosis    Personal history of chemotherapy 2015   Right Breast Cancer   Personal history of radiation therapy 2015   Right Breast Cancer   Pure hypercholesterolemia    SVT (supraventricular tachycardia) ([place])     Past Surgical History:  Procedure Laterality Date   30 Day Monitor  04/10/2009 to 05/23/2009   No End Of Summary Report Atrial Fib,Narrow Complex Tachycardia,Sinus Rhythm    ABDOMINAL HYSTERECTOMY  1987   BREAST EXCISIONAL BIOPSY Left    BREAST LUMPECTOMY Right 2015   BREAST LUMPECTOMY WITH NEEDLE LOCALIZATION AND AXILLARY SENTINEL LYMPH NODE BX Right 04/02/2014   Procedure: Right Axillary Lymphatic Mapping; Right axillary Sentinal Node Biopsy; Right Partial Mastectomy after wire localization;  Surgeon: Jackolyn Confer, MD;  Location: [facility];  Service: General;  Laterality: Right;   BREAST SURGERY     Left   CYSTOSCOPY  04/25/2012   Pt may be a candidate for research anti-incontinence procedure protocol   Dipyridamole Myoview  05/27/2009   The post stress myocardial perfusion images show a normal pattern of perfusion in all regions.The post stress ejection fraction is 83 %. Normal  myocardial perfusion imaging This is a low risk scan.   DOPPLER ECHOCARDIOGRAPHY  08/21/2010   Ejecion Fraction =>55% All chambers are normal in size and function No significant change from 2010.  lower Venous Dopplers  04/11/2009   Normal bilateral lower ext venous duplex   PORTACATH PLACEMENT N/A 04/26/2014   Procedure: ULTRASOUND GUIDED PORT A CATHE INSERTION;  Surgeon: Jackolyn Confer, MD;  Location: [facility];  Service: General;  Laterality: N/A;   Upper Arterial Dopplers  06/02/2011   Right Side PPG Demonstrates siginificantly diminshed waveforms with the Adson's maneuver which may suggest Thoracic Outlet Syndrone. This is a abnormal thoracic outlet arterial evaluation.     Current Outpatient Medications  Medication Sig Dispense Refill    albuterol (PROVENTIL HFA;VENTOLIN HFA) 108 (90 Base) MCG/ACT inhaler Inhale 2 puffs into the lungs every 4 (four) hours as needed (cough, shortness of breath or wheezing.). 1 Inhaler 3   amitriptyline (ELAVIL) 25 MG tablet Take 25 mg by mouth at bedtime as needed for sleep. When patient has migraines     aspirin EC 81 MG tablet Take 81 mg by mouth daily.     Calcium Carbonate-Vitamin D (CALTRATE 600+D PO) Take by mouth.     cetirizine (ZYRTEC) 10 MG tablet Take 10 mg by mouth daily.     chlorhexidine (PERIDEX) 0.12 % solution 15 mLs 2 (two) times daily.     Cholecalciferol (VITAMIN D3) 5000 units CAPS Take 5,000 Units by mouth 2 (two) times daily.      flecainide (TAMBOCOR) 100 MG tablet Take 0.5 tablets (50 mg total) by mouth 2 (two) times daily. 90 tablet 3   fluticasone (FLONASE) 50 MCG/ACT nasal spray Place 2 sprays into both nostrils daily. 16 g 3   ibuprofen (ADVIL,MOTRIN) 200 MG tablet Take 200 mg by mouth every 6 (six) hours as needed for headache or moderate pain.     letrozole (FEMARA) 2.5 MG tablet Take 1 tablet by mouth once daily 90 tablet 1   Meth-Hyo-M Bl-Na Phos-Ph Sal (URIBEL) 118 MG CAPS Take 118 mg by mouth 4 (four) times daily as needed (for bladder). Reported on 09/09/2015  2   metoprolol succinate (TOPROL-XL) 50 MG 24 hr tablet TAKE 1 TABLET BY MOUTH DAILY. TAKE WITH OR IMMEDIATELY FOLLOWING A MEAL. 90 tablet 2   PARoxetine (PAXIL) 20 MG tablet Take 1 tablet by mouth once daily 90 tablet 0   rosuvastatin (CRESTOR) 5 MG tablet Take 1 tablet (5 mg total) by mouth daily. 90 tablet 3   Current Facility-Administered Medications  Medication Dose Route Frequency Provider Last Rate Last Dose   0.9 %  sodium chloride infusion  500 mL Intravenous Continuous Irene Shipper, MD        Allergies:   Latex, Sulfonamide derivatives, Clarithromycin, Crestor [rosuvastatin], Livalo [pitavastatin], Statins, and Ciprofloxacin hcl    Social History:  The patient  reports that she  has never smoked. She has never used smokeless tobacco. She reports that she does not drink alcohol or use drugs.   Family History:  The patient's family history includes Breast cancer (age of onset: 101) in her sister; Cancer in her maternal aunt, mother, and another family member; Cancer (age of onset: 40) in her cousin; Diabetes in her brother and sister; Heart attack in her sister; Hypertension in her sister; Other in her father; Stomach cancer in her maternal aunt.    ROS:  Please see the history of present illness.   Otherwise, review of systems are positive for none.   All other systems are reviewed and negative.    PHYSICAL EXAM: VS:  BP (!) 144/88    Pulse (!) 53    Temp 97.7 F (36.5 C)  Ht 5' 3.4" (1.61 m)    Wt 160 lb 12.8 oz (72.9 kg)    LMP 07/06/1985 (Approximate)    BMI 28.13 kg/m  , BMI Body mass index is 28.13 kg/m. GEN: Well nourished, well developed, in no acute distress HEENT: sclera anicteric Neck: no JVD, carotid bruits, or masses Cardiac: RRR; no murmurs, rubs, or gallops, no edema  Respiratory:  clear to auscultation bilaterally, normal work of breathing GI: soft, nontender, nondistended, + BS MS: no deformity or atrophy Skin: warm and dry, no rash Neuro:  Strength and sensation are intact Psych: euthymic mood, full affect   EKG:  EKG is ordered today. The ekg ordered today demonstrates sinus bradycardia with 1st degree AV block, rate 53 bpm, no STE/D, no TWI, QTc 429; no significant change from previous   Recent Labs: No results found for requested labs within last 8760 hours.    Lipid Panel    Component Value Date/Time   CHOL 172 01/24/2018 1230   CHOL 237 (H) 06/22/2013 1405   TRIG 106 01/24/2018 1230   TRIG 103 06/22/2013 1405   HDL 71 01/24/2018 1230   HDL 74 06/22/2013 1405   CHOLHDL 2.4 01/24/2018 1230   CHOLHDL 3.2 07/28/2016 1151   VLDL 22 07/28/2016 1151   LDLCALC 80 01/24/2018 1230   LDLCALC 142 (H) 06/22/2013 1405      Wt  Readings from Last 3 Encounters:  05/09/19 160 lb 12.8 oz (72.9 kg)  06/30/18 162 lb 1.6 oz (73.5 kg)  02/06/18 162 lb 12.8 oz (73.8 kg)      Other studies Reviewed: Additional studies/ records that were reviewed today include:   Echocardiogram 2015: Study Conclusions   - Left ventricle: The cavity size was normal. Wall thickness was  normal. Systolic function was normal. The estimated ejection  fraction was in the range of 60% to 65%.     ASSESSMENT AND PLAN:  1. Paroxysmal atrial fibrillation: maintaining sinus rhythm.  - Continue flecainide for rhythm control - Continue metoprolol for rate control - Not on anticoagulation as she has been maintaining NSR, though if atrial fibrillation reoccurred would initiate apixaban 5mg  BID  2. Paroxysmal SVT:  maintaining sinus rhythm. No complaints of palpitations. Isolated episode of tachypalpitations after forgetting her medications one day.  - Continue flecainide for rhythm control - Continue metoprolol for rate control  3. Elevated blood pressure: BP 144/88 today. Patient is not eager to start a new medication for this and metoprolol unable to be uptitrated due to bradycardia. She does have an affinity for salt which may be contributing - Encouraged low sodium diet - Patient instructed to keep a log of her blood pressure and notify the office if persistently >130/80 as additional medications may be warranted at that time.   4. HLD: LDL 80 01/2018 - Will check FLP/LFTs today - Continue crestor    Current medicines are reviewed at length with the patient today.  The patient does not have concerns regarding medicines.  The following changes have been made:  no change  Labs/ tests ordered today include:   Orders Placed This Encounter  Procedures   Comprehensive metabolic panel   Lipid panel   EKG 12-Lead     Disposition:   FU with Dr. Linus Salmons in 1 year.  Signed, Abigail Butts, PA-C  05/09/2019 11:50 AM

## 2019-05-09 ENCOUNTER — Other Ambulatory Visit: Payer: Self-pay

## 2019-05-09 ENCOUNTER — Encounter: Payer: Self-pay | Admitting: Medical

## 2019-05-09 ENCOUNTER — Ambulatory Visit (INDEPENDENT_AMBULATORY_CARE_PROVIDER_SITE_OTHER): Payer: Medicare Other | Admitting: Medical

## 2019-05-09 VITALS — BP 144/88 | HR 53 | Temp 97.7°F | Ht 63.4 in | Wt 160.8 lb

## 2019-05-09 DIAGNOSIS — I480 Paroxysmal atrial fibrillation: Secondary | ICD-10-CM | POA: Diagnosis not present

## 2019-05-09 DIAGNOSIS — R030 Elevated blood-pressure reading, without diagnosis of hypertension: Secondary | ICD-10-CM | POA: Diagnosis not present

## 2019-05-09 DIAGNOSIS — E785 Hyperlipidemia, unspecified: Secondary | ICD-10-CM

## 2019-05-09 DIAGNOSIS — I471 Supraventricular tachycardia: Secondary | ICD-10-CM

## 2019-05-09 DIAGNOSIS — Z79899 Other long term (current) drug therapy: Secondary | ICD-10-CM | POA: Diagnosis not present

## 2019-05-09 MED ORDER — ROSUVASTATIN CALCIUM 5 MG PO TABS: 5.0000 mg | ORAL_TABLET | Freq: Every day | ORAL | 3 refills | Status: DC

## 2019-05-09 MED ORDER — FLECAINIDE ACETATE 100 MG PO TABS: 50.0000 mg | ORAL_TABLET | Freq: Two times a day (BID) | ORAL | 3 refills | Status: DC

## 2019-05-09 NOTE — Patient Instructions (Signed)
Medication Instructions:  The current medical regimen is effective;  continue present plan and medications as directed. Please refer to the Current Medication list given to you today.  *If you need a refill on your cardiac medications before your next appointment, please call your pharmacy*  Lab Work: FASTING LIPID PANEL AND CMET TODAY If you have labs (blood work) drawn today and your tests are completely normal, you will receive your results only by:  [place] (if you have MyChart) OR  A paper copy in the mail If you have any lab test that is abnormal or we need to change your treatment, we will call you to review the res  Follow-Up: At [facility], you and your health needs are our priority.  As part of our continuing mission to provide you with exceptional heart care, we have created designated Provider Care Teams.  These Care Teams include your primary Cardiologist (physician) and Advanced Practice Providers (APPs -  Physician Assistants and Nurse Practitioners) who all work together to provide you with the care you need, when you need it.  Your next appointment:   12 months  The format for your next appointment:   In Person  Provider:   You may see Shelva Majestic, MD  Roby Lofts, PA-C or one of the following Advanced Practice Providers on your designated Care Team:  Almyra Deforest, PA-C  Fabian Sharp, [place] or Roby Lofts, [place]  Other Instructions PLEASE FOLLOW SALTY 6 HANDOUT  PLEASE FOLLOW LOW SODIUM DIET    Low-Sodium Eating Plan Sodium, which is an element that makes up salt, helps you maintain a healthy balance of fluids in your body. Too much sodium can increase your blood pressure and cause fluid and waste to be held in your body. Your health care provider or dietitian may recommend following this plan if you have high blood pressure (hypertension), kidney disease, liver disease, or heart failure. Eating less sodium can help lower your blood pressure, reduce  swelling, and protect your heart, liver, and kidneys. What are tips for following this plan? General guidelines  Most people on this plan should limit their sodium intake to 1,500-2,000 mg (milligrams) of sodium each day. Reading food labels   The Nutrition Facts label lists the amount of sodium in one serving of the food. If you eat more than one serving, you must multiply the listed amount of sodium by the number of servings.  Choose foods with less than 140 mg of sodium per serving.  Avoid foods with 300 mg of sodium or more per serving. Shopping  Look for lower-sodium products, often labeled as "low-sodium" or "no salt added."  Always check the sodium content even if foods are labeled as "unsalted" or "no salt added".  Buy fresh foods. ? Avoid canned foods and premade or frozen meals. ? Avoid canned, cured, or processed meats  Buy breads that have less than 80 mg of sodium per slice. Cooking  Eat more home-cooked food and less restaurant, buffet, and fast food.  Avoid adding salt when cooking. Use salt-free seasonings or herbs instead of table salt or sea salt. Check with your health care provider or pharmacist before using salt substitutes.  Cook with plant-based oils, such as canola, sunflower, or olive oil. Meal planning  When eating at a restaurant, ask that your food be prepared with less salt or no salt, if possible.  Avoid foods that contain MSG (monosodium glutamate). MSG is sometimes added to [place] food, bouillon, and some canned foods.  What foods are recommended? The items listed may not be a complete list. Talk with your dietitian about what dietary choices are best for you. Grains Low-sodium cereals, including oats, puffed wheat and rice, and shredded wheat. Low-sodium crackers. Unsalted rice. Unsalted pasta. Low-sodium bread. Whole-grain breads and whole-grain pasta. Vegetables Fresh or frozen vegetables. "No salt added" canned vegetables. "No salt added"  tomato sauce and paste. Low-sodium or reduced-sodium tomato and vegetable juice. Fruits Fresh, frozen, or canned fruit. Fruit juice. Meats and other protein foods Fresh or frozen (no salt added) meat, poultry, seafood, and fish. Low-sodium canned tuna and salmon. Unsalted nuts. Dried peas, beans, and lentils without added salt. Unsalted canned beans. Eggs. Unsalted nut butters. Dairy Milk. Soy milk. Cheese that is naturally low in sodium, such as ricotta cheese, fresh mozzarella, or Swiss cheese Low-sodium or reduced-sodium cheese. Cream cheese. Yogurt. Fats and oils Unsalted butter. Unsalted margarine with no trans fat. Vegetable oils such as canola or olive oils. Seasonings and other foods Fresh and dried herbs and spices. Salt-free seasonings. Low-sodium mustard and ketchup. Sodium-free salad dressing. Sodium-free light mayonnaise. Fresh or refrigerated horseradish. Lemon juice. Vinegar. Homemade, reduced-sodium, or low-sodium soups. Unsalted popcorn and pretzels. Low-salt or salt-free chips. What foods are not recommended? The items listed may not be a complete list. Talk with your dietitian about what dietary choices are best for you. Grains Instant hot cereals. Bread stuffing, pancake, and biscuit mixes. Croutons. Seasoned rice or pasta mixes. Noodle soup cups. Boxed or frozen macaroni and cheese. Regular salted crackers. Self-rising flour. Vegetables Sauerkraut, pickled vegetables, and relishes. Olives. [place] fries. Onion rings. Regular canned vegetables (not low-sodium or reduced-sodium). Regular canned tomato sauce and paste (not low-sodium or reduced-sodium). Regular tomato and vegetable juice (not low-sodium or reduced-sodium). Frozen vegetables in sauces. Meats and other protein foods Meat or fish that is salted, canned, smoked, spiced, or pickled. Bacon, ham, sausage, hotdogs, corned beef, chipped beef, packaged lunch meats, salt pork, jerky, pickled herring, anchovies, regular canned  tuna, sardines, salted nuts. Dairy Processed cheese and cheese spreads. Cheese curds. Blue cheese. Feta cheese. String cheese. Regular cottage cheese. Buttermilk. Canned milk. Fats and oils Salted butter. Regular margarine. Ghee. Bacon fat. Seasonings and other foods Onion salt, garlic salt, seasoned salt, table salt, and sea salt. Canned and packaged gravies. Worcestershire sauce. Tartar sauce. Barbecue sauce. Teriyaki sauce. Soy sauce, including reduced-sodium. Steak sauce. Fish sauce. Oyster sauce. Cocktail sauce. Horseradish that you find on the shelf. Regular ketchup and mustard. Meat flavorings and tenderizers. Bouillon cubes. Hot sauce and Tabasco sauce. Premade or packaged marinades. Premade or packaged taco seasonings. Relishes. Regular salad dressings. Salsa. Potato and tortilla chips. Corn chips and puffs. Salted popcorn and pretzels. Canned or dried soups. Pizza. Frozen entrees and pot pies. Summary  Eating less sodium can help lower your blood pressure, reduce swelling, and protect your heart, liver, and kidneys.  Most people on this plan should limit their sodium intake to 1,500-2,000 mg (milligrams) of sodium each day.  Canned, boxed, and frozen foods are high in sodium. Restaurant foods, fast foods, and pizza are also very high in sodium. You also get sodium by adding salt to food.  Try to cook at home, eat more fresh fruits and vegetables, and eat less fast food, canned, processed, or prepared foods. This information is not intended to replace advice given to you by your health care provider. Make sure you discuss any questions you have with your health care provider. Document Released: 12/11/2001 Document Revised: 06/03/2017  Document Reviewed: 06/14/2016 Elsevier Patient Education  [employer].

## 2019-05-10 LAB — COMPREHENSIVE METABOLIC PANEL
ALT: 23 IU/L (ref 0–32)
AST: 24 IU/L (ref 0–40)
Albumin/Globulin Ratio: 1.6 (ref 1.2–2.2)
Albumin: 4.6 g/dL (ref 3.8–4.8)
Alkaline Phosphatase: 114 IU/L (ref 39–117)
BUN/Creatinine Ratio: 8 — ABNORMAL LOW (ref 12–28)
BUN: 7 mg/dL — ABNORMAL LOW (ref 8–27)
Bilirubin Total: 0.5 mg/dL (ref 0.0–1.2)
CO2: 21 mmol/L (ref 20–29)
Calcium: 9.6 mg/dL (ref 8.7–10.3)
Chloride: 108 mmol/L — ABNORMAL HIGH (ref 96–106)
Creatinine, Ser: 0.89 mg/dL (ref 0.57–1.00)
GFR calc Af Amer: 76 mL/min/{1.73_m2} (ref >59)
GFR calc non Af Amer: 66 mL/min/{1.73_m2} (ref >59)
Globulin, Total: 2.9 g/dL (ref 1.5–4.5)
Glucose: 87 mg/dL (ref 65–99)
Potassium: 4.3 mmol/L (ref 3.5–5.2)
Sodium: 145 mmol/L — ABNORMAL HIGH (ref 134–144)
Total Protein: 7.5 g/dL (ref 6.0–8.5)

## 2019-05-10 LAB — LIPID PANEL
Chol/HDL Ratio: 2.4 ratio (ref 0.0–4.4)
Cholesterol, Total: 156 mg/dL (ref 100–199)
HDL: 65 mg/dL (ref >39)
LDL Chol Calc (NIH): 73 mg/dL (ref 0–99)
Triglycerides: 102 mg/dL (ref 0–149)
VLDL Cholesterol Cal: 18 mg/dL (ref 5–40)

## 2019-05-17 ENCOUNTER — Telehealth: Payer: Self-pay

## 2019-05-17 NOTE — Telephone Encounter (Addendum)
Called patient to go over lab results, could not leave a voice message due to the patient's voicemail box is full. Will try calling patient again and will also mail out a letter with the comments from the provider  ----- Message from Abigail Butts, PA-C sent at 05/11/2019 12:46 PM EST ----- Please notify the patient that her kidney function and liver function appears stable from last year. Her sodium is mildly elevated and she should lower her salt intake as we discussed at her visit 05/09/2019. Her cholesterol levels continue to be well controlled. She should continue taking crestor (rosuvastatin) as prescribed. Thank you!

## 2019-06-03 ENCOUNTER — Other Ambulatory Visit: Payer: Self-pay | Admitting: Hematology and Oncology

## 2019-06-03 DIAGNOSIS — F419 Anxiety disorder, unspecified: Secondary | ICD-10-CM

## 2019-06-21 ENCOUNTER — Telehealth: Payer: Self-pay | Admitting: Internal Medicine

## 2019-06-21 NOTE — Telephone Encounter (Signed)
Pt needs to know the period of time that she took zantac or ranitidine. She thinks that it was long time ago when she was seen Dr. Sharlett Iles. Pls call her with this information.

## 2019-06-22 NOTE — Telephone Encounter (Signed)
Sent patient a mychart message telling I could not find any mention of Zantac or Pepcid in her chart, only Nexium

## 2019-07-03 ENCOUNTER — Telehealth: Payer: Self-pay | Admitting: Internal Medicine

## 2019-07-03 NOTE — Telephone Encounter (Signed)
The pt has not been seen in the office for over 10 years and would like advise for reflux.  I explained that because we have not seen her for this issue she should call her PCP or if she thinks she needs to be seen sooner than the appt we provided for her with Janett Billow she could she urgent care.  She agreed and will keep appt with Janett Billow and follow up with urgent care or PCP

## 2019-07-03 NOTE — Telephone Encounter (Addendum)
Patient called to schedule appt. With Dr. Henrene Pastor is experiencing severe reflux, she says sometimes it's so bad it goes through all her airways and she can't breath. Gave her follow up with Janett Billow for it was the soonest appt available. Is requesting to speak with nurse please advise.

## 2019-07-04 ENCOUNTER — Telehealth: Payer: Self-pay

## 2019-07-04 MED ORDER — ESOMEPRAZOLE MAGNESIUM 20 MG PO CPDR: 20.0000 mg | DELAYED_RELEASE_CAPSULE | Freq: Every day | ORAL | 1 refills | Status: DC

## 2019-07-04 NOTE — Telephone Encounter (Signed)
Pt called to report she is having "a flare up with my acid reflux."  Pt is unable to get into GI doctor or PCP until February.  Pt experiencing tenderness in esophagus, acid taste in mouth when reflux is active.    Pt with h/o GERD and PUD. Pt has currently starting changing diet to bland diet.  RN reviewed with MD.  MD recommendations for Nexium and follow up with GI.  Pt voiced understanding and appreciation.

## 2019-07-20 ENCOUNTER — Ambulatory Visit: Payer: Medicare Other | Admitting: Gastroenterology

## 2019-08-06 ENCOUNTER — Ambulatory Visit: Payer: Medicare Other | Admitting: Internal Medicine

## 2019-08-26 ENCOUNTER — Ambulatory Visit: Payer: TRICARE For Life (TFL)

## 2019-08-26 ENCOUNTER — Ambulatory Visit: Payer: TRICARE For Life (TFL) | Attending: Internal Medicine

## 2019-08-26 DIAGNOSIS — Z23 Encounter for immunization: Secondary | ICD-10-CM | POA: Insufficient documentation

## 2019-08-26 NOTE — Progress Notes (Signed)
   Covid-19 Vaccination Clinic  Name:  Shylie Goetze    MRN: [id number] DOB: 06-Dec-1950  08/26/2019  Ms. Iseman was observed post Covid-19 immunization for 30 minutes based on pre-vaccination screening without incidence. She was provided with Vaccine Information Sheet and instruction to access the V-Safe system.   Ms. Ketchem was instructed to call 911 with any severe reactions post vaccine: [name] Difficulty breathing  . Swelling of your face and throat  . A fast heartbeat  . A bad rash all over your body  . Dizziness and weakness    Immunizations Administered    Name Date Dose VIS Date Route   Pfizer COVID-19 Vaccine 08/26/2019  1:52 PM 0.3 mL 06/15/2019 Intramuscular   Manufacturer: [place]   Lot: [id number]   [place]: [id number]

## 2019-08-28 ENCOUNTER — Telehealth: Payer: Self-pay | Admitting: Hematology and Oncology

## 2019-08-28 ENCOUNTER — Other Ambulatory Visit: Payer: Self-pay | Admitting: Hematology and Oncology

## 2019-08-28 NOTE — Telephone Encounter (Signed)
Scheduled 5/3 appt per 2/23 sch msg. Pt did not answer and I was not able to leave a voicemail because her mailbox was full. I mailed a reminder letter and calendar.

## 2019-08-29 ENCOUNTER — Other Ambulatory Visit: Payer: Self-pay | Admitting: Hematology and Oncology

## 2019-08-29 ENCOUNTER — Telehealth: Payer: Self-pay | Admitting: *Deleted

## 2019-08-29 MED ORDER — NYSTATIN 100000 UNIT/GM EX POWD: 1.0000 "application " | Freq: Three times a day (TID) | CUTANEOUS | 2 refills | Status: DC

## 2019-08-29 MED ORDER — FLUCONAZOLE 100 MG PO TABS: 100.0000 mg | ORAL_TABLET | Freq: Every day | ORAL | 0 refills | Status: DC

## 2019-08-29 NOTE — Telephone Encounter (Signed)
Received call from pt stating she is experiencing a red rash under her right breast.  Pt states initially she tried to use neosporin and A&D ointment but the rash has become worse.  Pt states she has gained some weight and she sweats more under her breasts due to the weight gain.  Per MD pt to use Nystatin powder three times a day as needed.  Pt also to take Diflucan 100 mg daily x7 days.  Prescriptions sent to pharmacy on file.  Pt educated to call the office if symptoms progress, pt verbalized understanding.

## 2019-09-19 ENCOUNTER — Ambulatory Visit: Payer: TRICARE For Life (TFL) | Attending: Internal Medicine

## 2019-09-19 DIAGNOSIS — Z23 Encounter for immunization: Secondary | ICD-10-CM

## 2019-09-19 NOTE — Progress Notes (Signed)
   Covid-19 Vaccination Clinic  Name:  Tami [name]    MRN: [id number] DOB: [birth year]  09/19/2019  [name] was observed post Covid-19 immunization for 30 minutes based on pre-vaccination screening without incident. She was provided with Vaccine Information Sheet and instruction to access the V-Safe system.   [name] was instructed to call 911 with any severe reactions post vaccine: [name] Difficulty breathing  . Swelling of face and throat  . A fast heartbeat  . A bad rash all over body  . Dizziness and weakness   Immunizations Administered    Name Date Dose VIS Date Route   Pfizer COVID-19 Vaccine 09/19/2019 10:08 AM 0.3 mL 06/15/2019 Intramuscular   Manufacturer: [place]   Lot: [id number]   [place]: [id number]

## 2019-09-20 ENCOUNTER — Other Ambulatory Visit: Payer: Self-pay | Admitting: Hematology and Oncology

## 2019-09-20 DIAGNOSIS — F419 Anxiety disorder, unspecified: Secondary | ICD-10-CM

## 2019-11-05 ENCOUNTER — Inpatient Hospital Stay: Payer: TRICARE For Life (TFL) | Admitting: Hematology and Oncology

## 2019-11-05 NOTE — Assessment & Plan Note (Deleted)
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 status post lumpectomy, Oncotype DX recurrence score 29 intermediate risk 19% risk of recurrence with hormonal therapy alone  Chemotherapy summary: Taxotere and Cytoxan 4 started 05/07/2014 completed 07/09/2014 Long-term chemotherapy toxicities: Neuropathy Radiation therapy: Started 08/07/2014 completed 09/16/14 ---------------------------------------------------------------------------- Current treatment:Anastrozole 1 mg daily started 10/04/14 switched to letrozole 09/16/2015, switched to exemestane12/27/2019 Patient took a 66-monthbreak from antiestrogen therapy and she resumed 06/30/2018.  Currently on half a tablet daily  Letrozole toxicities:  1. Hair loss 2. Constipation 3. Body pains  Breast Cancer Surveillance: 1. Breast exam3/28/2019: Normal 2. Mammogram10/4/2019noabnormalities. Postsurgical changes. Breast Density Category B.  Emotional issues in relation to her daughter's custody battle for her son Breast redness: Resolved after treatment with Diflucan and nystatin powder.  Return to clinic in 1 year for follow-up

## 2019-11-11 NOTE — Progress Notes (Signed)
Patient Care Team: Patient, No Pcp Per as PCP - General (General Practice) Troy Sine, MD as PCP - Cardiology (Cardiology) Terance Ice, MD (Inactive) (Cardiology) Carolan Clines, MD (Inactive) as Attending Physician (Urology) Azucena Fallen, MD as Consulting Physician (Obstetrics and Gynecology) Nicholas Lose, MD as Consulting Physician (Hematology and Oncology)  DIAGNOSIS:    ICD-10-CM   1. Malignant neoplasm of lower-inner quadrant of right breast of female, estrogen receptor positive ([place])  C50.311    Z17.0     SUMMARY OF ONCOLOGIC HISTORY: Oncology History  Breast cancer of lower-inner quadrant of right female breast ([place])  01/18/2014 Mammogram   U/S Breast: 7 x 7 x 9 mm irregular taller than wide hypoechoic mass with a peripheral echogenic rim in the right breast 4 o'clock position 7 cm from the nipple. No right axillary lymphadenopathy.    02/14/2014 Initial Diagnosis   Breast cancer-right Er 100%, PR 94%, Ki 67: 17%; Her2 Neg ratio 1.74; T1bN0Mo (Clinical Stage 1A)   04/02/2014 Surgery   Right breast lumpectomy: Invasive ductal carcinoma negative for lymph vascular invasion, 1.6 cm, grade 2, 3 SLN negative, reexcision margins benign, ER 100%, PR 94%, HER-2 negative Ki-67 70% T1 C. N0 M0 stage IA: Oncotype DX 29 (19%ROR)   05/07/2014 - 07/09/2014 Chemotherapy   Taxotere Cytoxan adjuvant chemotherapy every 3 weeks x4 cycles (after cycle one hospitalization for pain related to Neulasta and severe gastritis due to steroids)   08/07/2014 - 09/16/2014 Radiation Therapy   Adjuvant radiation therapy   10/04/2014 -  Anti-estrogen oral therapy   Anastrozole 1 mg daily changed to letrozole 09/16/2015 due to hot flashes switched to exemestane 07/02/18 due to myalgias switched back to letrozole 08/02/18 due to severe joint stiffness     CHIEF COMPLIANT: Follow-up pf right breast cancer on letrozole therapy  INTERVAL HISTORY: Tami [name] is a 69 y.o. with above-mentioned  history of right breast cancer treated with lumpectomy, adjuvant chemotherapy, radiation, and who is currently on antiestrogen therapy with letrozole. Mammogram on 04/10/19 showed no evidence of malignancy bilaterally. She presents to the clinic today for follow-up.   She denies any lumps or nodules in the breast.  However the breasts are very tender.  ALLERGIES:  is allergic to latex; sulfonamide derivatives; clarithromycin; crestor [rosuvastatin]; livalo [pitavastatin]; statins; and ciprofloxacin hcl.  MEDICATIONS:  Current Outpatient Medications  Medication Sig Dispense Refill  . albuterol (PROVENTIL HFA;VENTOLIN HFA) 108 (90 Base) MCG/ACT inhaler Inhale 2 puffs into the lungs every 4 (four) hours as needed (cough, shortness of breath or wheezing.). 1 Inhaler 3  . amitriptyline (ELAVIL) 25 MG tablet Take 25 mg by mouth at bedtime as needed for sleep. When patient has migraines    . aspirin EC 81 MG tablet Take 81 mg by mouth daily.    . Calcium Carbonate-Vitamin D (CALTRATE 600+D PO) Take by mouth.    . cetirizine (ZYRTEC) 10 MG tablet Take 10 mg by mouth daily.    . chlorhexidine (PERIDEX) 0.12 % solution 15 mLs 2 (two) times daily.    . Cholecalciferol (VITAMIN D3) 5000 units CAPS Take 5,000 Units by mouth 2 (two) times daily.     [name] esomeprazole (NEXIUM) 20 MG capsule Take 1 capsule (20 mg total) by mouth daily at 12 noon. 30 capsule 1  . flecainide (TAMBOCOR) 100 MG tablet Take 0.5 tablets (50 mg total) by mouth 2 (two) times daily. 90 tablet 3  . fluconazole (DIFLUCAN) 100 MG tablet Take 1 tablet (100 mg total) by mouth  daily. Take one tablet daily by mouth for 7 days. 7 tablet 0  . fluticasone (FLONASE) 50 MCG/ACT nasal spray Place 2 sprays into both nostrils daily. 16 g 3  . ibuprofen (ADVIL,MOTRIN) 200 MG tablet Take 200 mg by mouth every 6 (six) hours as needed for headache or moderate pain.    [name] letrozole (FEMARA) 2.5 MG tablet Take 1 tablet by mouth once daily 90 tablet 0  .  Meth-Hyo-M Bl-Na Phos-Ph Sal (URIBEL) 118 MG CAPS Take 118 mg by mouth 4 (four) times daily as needed (for bladder). Reported on 09/09/2015  2  . metoprolol succinate (TOPROL-XL) 50 MG 24 hr tablet TAKE 1 TABLET BY MOUTH DAILY. TAKE WITH OR IMMEDIATELY FOLLOWING A MEAL. 90 tablet 2  . nystatin (MYCOSTATIN/NYSTOP) powder Apply 1 application topically 3 (three) times daily. 15 g 2  . PARoxetine (PAXIL) 20 MG tablet Take 1 tablet by mouth once daily 90 tablet 0  . rosuvastatin (CRESTOR) 5 MG tablet Take 1 tablet (5 mg total) by mouth daily. 90 tablet 3   Current Facility-Administered Medications  Medication Dose Route Frequency Provider Last Rate Last Admin  . 0.9 %  sodium chloride infusion  500 mL Intravenous Continuous Irene Shipper, MD        PHYSICAL EXAMINATION: ECOG PERFORMANCE STATUS: 1 - Symptomatic but completely ambulatory  Vitals:   11/12/19 1157  BP: 134/75  Pulse: (!) 59  Resp: 18  Temp: 98.7 F (37.1 C)  SpO2: 99%   Filed Weights   11/12/19 1157  Weight: 161 lb 8 oz (73.3 kg)    BREAST: No palpable masses or nodules in either right or left breasts. No palpable axillary supraclavicular or infraclavicular adenopathy no breast tenderness or nipple discharge. (exam performed in the presence of a chaperone)  LABORATORY DATA:  I have reviewed the data as listed CMP Latest Ref Rng & Units 05/09/2019 01/24/2018 10/12/2017  Glucose 65 - 99 mg/dL 87 98 101(H)  BUN 8 - 27 mg/dL 7(L) 8 10  Creatinine 0.57 - 1.00 mg/dL 0.89 0.89 0.88  Sodium 134 - 144 mmol/L 145(H) 142 142  Potassium 3.5 - 5.2 mmol/L 4.3 4.3 4.1  Chloride 96 - 106 mmol/L 108(H) 104 105  CO2 20 - 29 mmol/L 21 22 22   Calcium 8.7 - 10.3 mg/dL 9.6 9.6 9.7  Total Protein 6.0 - 8.5 g/dL 7.5 7.3 7.2  Total Bilirubin 0.0 - 1.2 mg/dL 0.5 0.4 0.4  Alkaline Phos 39 - 117 IU/L 114 110 115  AST 0 - 40 IU/L 24 21 30   ALT 0 - 32 IU/L 23 17 33(H)    Lab Results  Component Value Date   WBC 3.4 10/12/2017   HGB 13.1  10/12/2017   HCT 37.9 10/12/2017   MCV 93 10/12/2017   PLT 243 10/12/2017   NEUTROABS 2.5 05/09/2017    ASSESSMENT & PLAN:  Breast cancer of lower-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 status post lumpectomy, Oncotype DX recurrence score 29 intermediate risk 19% risk of recurrence with hormonal therapy alone  Chemotherapy summary: Taxotere and Cytoxan 4 started 05/07/2014 completed 07/09/2014 Long-term chemotherapy toxicities: Neuropathy Radiation therapy: Started 08/07/2014 completed 09/16/14 ---------------------------------------------------------------------------- Current treatment:Anastrozole 1 mg daily started 10/04/14 switched to letrozole 09/16/2015, switched to exemestane12/27/2019 Patient took a 47-monthbreak from antiestrogen therapy and she resumed 06/30/2018.  We tried a half a tablet but because of breaking unevenly  she decided to stay on the 1 tablet a day.  Letrozole toxicities:  1. Hair loss 2. Constipation 3. Body pains: Especially along her thighs.  She is getting physical therapy.  Recommended taking half tab daily.  Breast Cancer Surveillance: 1. Breast exam5/04/2020: Benign, extremely tender to palpation but no lumps or nodules. 2. Mammogram10/12/2018: Benign. Postsurgical changes. Breast Density Category B.  Return to clinic in 1 year for follow-up  No orders of the defined types were placed in this encounter.  The patient has a good understanding of the overall plan. she agrees with it. she will call with any problems that may develop before the next visit here.  Total time spent: 20 mins including face to face time and time spent for planning, charting and coordination of care  Nicholas Lose, MD 11/12/2019  I, Cloyde Reams Dorshimer, am acting as scribe for Dr. Nicholas Lose.  I have reviewed the above documentation for accuracy and completeness,  and I agree with the above.

## 2019-11-12 ENCOUNTER — Inpatient Hospital Stay: Payer: Medicare PPO | Attending: Hematology and Oncology | Admitting: Hematology and Oncology

## 2019-11-12 ENCOUNTER — Other Ambulatory Visit: Payer: Self-pay

## 2019-11-12 DIAGNOSIS — Z170 Estrogen receptor positive status [ER+]: Secondary | ICD-10-CM | POA: Diagnosis not present

## 2019-11-12 DIAGNOSIS — Z9221 Personal history of antineoplastic chemotherapy: Secondary | ICD-10-CM | POA: Insufficient documentation

## 2019-11-12 DIAGNOSIS — Z79811 Long term (current) use of aromatase inhibitors: Secondary | ICD-10-CM | POA: Diagnosis not present

## 2019-11-12 DIAGNOSIS — C50311 Malignant neoplasm of lower-inner quadrant of right female breast: Secondary | ICD-10-CM | POA: Diagnosis present

## 2019-11-12 DIAGNOSIS — Z923 Personal history of irradiation: Secondary | ICD-10-CM | POA: Diagnosis not present

## 2019-11-12 MED ORDER — LETROZOLE 2.5 MG PO TABS: 2.5000 mg | ORAL_TABLET | Freq: Every day | ORAL | 3 refills | Status: DC

## 2019-11-12 NOTE — Assessment & Plan Note (Signed)
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 status post lumpectomy, Oncotype DX recurrence score 29 intermediate risk 19% risk of recurrence with hormonal therapy alone  Chemotherapy summary: Taxotere and Cytoxan 4 started 05/07/2014 completed 07/09/2014 Long-term chemotherapy toxicities: Neuropathy Radiation therapy: Started 08/07/2014 completed 09/16/14 ---------------------------------------------------------------------------- Current treatment:Anastrozole 1 mg daily started 10/04/14 switched to letrozole 09/16/2015, switched to exemestane12/27/2019 Patient took a 17-monthbreak from antiestrogen therapy and she resumed 06/30/2018.  Letrozole toxicities:  1. Hair loss 2. Constipation 3. Body pains  Recommended taking half tab daily.  Breast Cancer Surveillance: 1. Breast exam5/04/2020: Benign 2. Mammogram10/12/2018: Benign. Postsurgical changes. Breast Density Category B.

## 2019-11-13 ENCOUNTER — Telehealth: Payer: Self-pay | Admitting: Hematology and Oncology

## 2019-11-13 NOTE — Telephone Encounter (Signed)
Scheduled per 05/10 los, patient has been called and notified.

## 2020-01-10 ENCOUNTER — Other Ambulatory Visit: Payer: Self-pay | Admitting: Hematology and Oncology

## 2020-01-10 DIAGNOSIS — F419 Anxiety disorder, unspecified: Secondary | ICD-10-CM

## 2020-03-11 ENCOUNTER — Other Ambulatory Visit: Payer: Self-pay | Admitting: Cardiovascular Disease

## 2020-03-28 ENCOUNTER — Other Ambulatory Visit: Payer: Self-pay | Admitting: Hematology and Oncology

## 2020-03-28 DIAGNOSIS — Z9889 Other specified postprocedural states: Secondary | ICD-10-CM

## 2020-04-17 ENCOUNTER — Other Ambulatory Visit: Payer: Self-pay

## 2020-04-17 ENCOUNTER — Ambulatory Visit
Admission: RE | Admit: 2020-04-17 | Discharge: 2020-04-17 | Disposition: A | Discharge status: Home / Self Care | Payer: TRICARE For Life (TFL) | Source: Ambulatory Visit | Attending: Hematology and Oncology | Admitting: Hematology and Oncology

## 2020-04-17 DIAGNOSIS — N644 Mastodynia: Secondary | ICD-10-CM | POA: Diagnosis not present

## 2020-04-17 DIAGNOSIS — Z9889 Other specified postprocedural states: Secondary | ICD-10-CM

## 2020-05-02 ENCOUNTER — Telehealth: Payer: Self-pay | Admitting: *Deleted

## 2020-05-02 NOTE — Telephone Encounter (Signed)
Tami [name] states she would like to taper off or stop Letrozole. She has been taking it 5-6 years. She has been having pain in extremities due to Letrozole and Crestor. States she and Dr Lindi Adie have discussed this at last appt. Please advise

## 2020-05-05 ENCOUNTER — Other Ambulatory Visit: Payer: Self-pay

## 2020-05-05 ENCOUNTER — Telehealth: Payer: Self-pay | Admitting: *Deleted

## 2020-05-05 ENCOUNTER — Ambulatory Visit
Admission: RE | Admit: 2020-05-05 | Discharge: 2020-05-05 | Disposition: A | Discharge status: Home / Self Care | Payer: TRICARE For Life (TFL) | Source: Ambulatory Visit | Attending: Emergency Medicine | Admitting: Emergency Medicine

## 2020-05-05 VITALS — BP 138/78 | HR 63 | Temp 98.4°F | Resp 15 | Ht 63.0 in | Wt 161.6 lb

## 2020-05-05 DIAGNOSIS — J069 Acute upper respiratory infection, unspecified: Secondary | ICD-10-CM | POA: Diagnosis not present

## 2020-05-05 MED ORDER — ALBUTEROL SULFATE HFA 108 (90 BASE) MCG/ACT IN AERS: 2.0000 | INHALATION_SPRAY | Freq: Four times a day (QID) | RESPIRATORY_TRACT | 2 refills | Status: AC | PRN

## 2020-05-05 MED ORDER — AEROCHAMBER PLUS FLO-VU MEDIUM MISC: 1.0000 | Freq: Once | 0 refills | Status: AC

## 2020-05-05 MED ORDER — HYDROCOD POLST-CPM POLST ER 10-8 MG/5ML PO SUER
5.0000 mL | Freq: Every evening | ORAL | 0 refills | Status: DC | PRN
Start: 2020-05-05 — End: 2020-05-05

## 2020-05-05 MED ORDER — BENZONATATE 100 MG PO CAPS: 100.0000 mg | ORAL_CAPSULE | Freq: Three times a day (TID) | ORAL | 0 refills | Status: DC

## 2020-05-05 MED ORDER — HYDROCOD POLST-CPM POLST ER 10-8 MG/5ML PO SUER
5.0000 mL | Freq: Every evening | ORAL | 0 refills | Status: DC | PRN
Start: 2020-05-05 — End: 2020-06-02

## 2020-05-05 NOTE — ED Provider Notes (Signed)
[facility]    CSN: [id number] Arrival date & time: 05/05/20  1660      History   Chief Complaint Chief Complaint  Patient presents with  . Cough    HPI Tami [name] is a 69 y.o. female  Subjective:   Tami [name] is a 69 y.o. female here for evaluation of a cough.  The cough is without wheezing, dyspnea or hemoptysis, productive of green/yellow sputum, chest is painful during coughing and is aggravated by nothing. Onset of symptoms was 2 days ago, gradually improving since that time.  Associated symptoms include sputum production. Patient does have a history of asthma. Patient has not had recent travel. Patient does not have a history of smoking. Patient  has not had a previous chest x-ray. Patient has not had a PPD done.  Does note environmental allergies; neighbors have been burning trees recently. The following portions of the patient's history were reviewed and updated as appropriate: allergies, current medications, past family history, past medical history, past social history, past surgical history and problem list.     Past Medical History:  Diagnosis Date  . Anemia    as a child  . Anxiety   . Arthritis   . Asthma   . Atrial fibrillation ([place])   . Breast cancer ([place]) 2015  . Depression with anxiety   . Dysrhythmia    hx AF  . Family history of malignant neoplasm of breast   . Fibromyalgia   . Heart palpitations   . Heartburn   . Hyperlipidemia   . Invasive ductal carcinoma of right breast ([place]) 02/08/14   Lower Inner Quadrant  . Irregular heartbeat   . Malignant neoplasm of breast (female), unspecified site   . Migraine   . Numbness of arm    Right Arm  . Osteoporosis   . Personal history of chemotherapy 2015   Right Breast Cancer  . Personal history of radiation therapy 2015   Right Breast Cancer  . Pure hypercholesterolemia   . SVT (supraventricular tachycardia) [facility])     Patient Active Problem List   Diagnosis Date Noted  .  Pelvic and perineal pain 08/19/2017  . Right hip pain 07/26/2017  . Weakness 07/26/2017  . Gait abnormality 07/26/2017  . Depression 04/23/2016  . Nausea without vomiting 04/23/2016  . Trigger finger, acquired 03/26/2016  . Swelling of both ankles 12/18/2015  . First degree heart block 12/18/2015  . Abdominal pain   . Abnormal LFTs   . Oral candidiasis 05/14/2014  . Dehydration 05/14/2014  . Generalized pain 05/14/2014  . PAF (paroxysmal atrial fibrillation) ([place]) 05/14/2014  . PUD (peptic ulcer disease) 05/14/2014  . Transaminitis 05/14/2014  . Normocytic anemia 05/14/2014  . Epigastric abdominal pain 05/14/2014  . Family history of malignant neoplasm of breast   . Breast cancer of lower-inner quadrant of right female breast ([place]) 02/14/2014  . Hyperlipidemia 05/23/2013  . Caregiver stress 10/23/2012  . Osteoporosis, unspecified 08/24/2012  . DDD (degenerative disc disease), lumbar 08/23/2012  . Female stress incontinence 05/28/2012  . Hydronephrosis 05/28/2012  . Numbness and tingling of right arm and leg 08/23/2011  . Essential hypertension 09/14/2010  . Abdominal pain, epigastric 09/14/2010  . IRRITABLE BOWEL SYNDROME 12/29/2009  . OSTEOARTHRITIS 12/29/2009  . 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 PSVT 07/11/2008  . GERD 06/24/2000  . CANDIDIASIS, ESOPHAGEAL 04/19/1995  . ASTHMA UNSPECIFIED WITH EXACERBATION 04/19/1995    Past Surgical History:  Procedure Laterality Date  . 30 Day Monitor  04/10/2009 to 05/23/2009   No End Of Summary Report Atrial Fib,Narrow Complex Tachycardia,Sinus Rhythm   . ABDOMINAL HYSTERECTOMY  1987  . BREAST EXCISIONAL BIOPSY Left   . BREAST LUMPECTOMY Right 2015  . BREAST LUMPECTOMY WITH NEEDLE LOCALIZATION AND AXILLARY SENTINEL LYMPH NODE BX Right 04/02/2014   Procedure: Right Axillary Lymphatic Mapping; Right axillary Sentinal Node Biopsy; Right Partial Mastectomy after wire localization;  Surgeon: Jackolyn Confer, MD;  Location: [facility];   Service: General;  Laterality: Right;  . BREAST SURGERY     Left  . CYSTOSCOPY  04/25/2012   Pt may be a candidate for research anti-incontinence procedure protocol  . Dipyridamole Myoview  05/27/2009   The post stress myocardial perfusion images show a normal pattern of perfusion in all regions.The post stress ejection fraction is 83 %. Normal  myocardial perfusion imaging This is a low risk scan.  . DOPPLER ECHOCARDIOGRAPHY  08/21/2010   Ejecion Fraction =>55% All chambers are normal in size and function No significant change from 2010.  [name] lower Venous Dopplers  04/11/2009   Normal bilateral lower ext venous duplex  . PORTACATH PLACEMENT N/A 04/26/2014   Procedure: ULTRASOUND GUIDED PORT A CATHE INSERTION;  Surgeon: Jackolyn Confer, MD;  Location: [facility];  Service: General;  Laterality: N/A;  . Upper Arterial Dopplers  06/02/2011   Right Side PPG Demonstrates siginificantly diminshed waveforms with the Adson's maneuver which may suggest Thoracic Outlet Syndrone. This is a abnormal thoracic outlet arterial evaluation.    OB History   No obstetric history on file.      Home Medications    Prior to Admission medications   Medication Sig Start Date End Date Taking? Authorizing Provider  aspirin EC 81 MG tablet Take 81 mg by mouth daily.   Yes [provider]  Calcium Carbonate-Vitamin D (CALTRATE 600+D PO) Take by mouth.   Yes [provider]  cetirizine (ZYRTEC) 10 MG tablet Take 10 mg by mouth daily.   Yes [provider]  Cholecalciferol (VITAMIN D3) 5000 units CAPS Take 5,000 Units by mouth 2 (two) times daily.    Yes [provider]  flecainide (TAMBOCOR) 100 MG tablet Take 0.5 tablets (50 mg total) by mouth 2 (two) times daily. 05/09/19  Yes Kroeger, Daleen Snook M., PA-C  ibuprofen (ADVIL,MOTRIN) 200 MG tablet Take 200 mg by mouth every 6 (six) hours as needed for headache or moderate pain.   Yes [provider]  letrozole (FEMARA) 2.5 MG  tablet Take 1 tablet (2.5 mg total) by mouth daily. 11/12/19  Yes Nicholas Lose, MD  Meth-Hyo-M [place] Hatter Phos-Ph Sal (URIBEL) 118 MG CAPS Take 118 mg by mouth 4 (four) times daily as needed (for bladder). Reported on 09/09/2015 03/22/14  Yes [provider]  metoprolol succinate (TOPROL-XL) 50 MG 24 hr tablet TAKE 1 TABLET BY MOUTH DAILY. TAKE WITH OR IMMEDIATELY FOLLOWING A MEAL. 03/12/20  Yes Troy Sine, MD  PARoxetine (PAXIL) 20 MG tablet Take 1 tablet by mouth once daily 01/11/20  Yes Nicholas Lose, MD  rosuvastatin (CRESTOR) 5 MG tablet Take 1 tablet (5 mg total) by mouth daily. 05/09/19  Yes Kroeger, Daleen Snook M., PA-C  albuterol (VENTOLIN HFA) 108 (90 Base) MCG/ACT inhaler Inhale 2 puffs into the lungs every 6 (six) hours as needed for wheezing or shortness of breath. 05/05/20   Hall-Potvin, [place], PA-C  benzonatate (TESSALON) 100 MG capsule Take 1 capsule (100 mg total) by mouth every 8 (eight) hours. 05/05/20   Hall-Potvin, [place], PA-C  chlorpheniramine-HYDROcodone (TUSSIONEX PENNKINETIC ER) 10-8 MG/5ML SUER Take 5 mLs by mouth at bedtime as needed for cough. 05/05/20   Hall-Potvin, [place], PA-C  fluconazole (DIFLUCAN) 100 MG tablet Take 1 tablet (100 mg total) by mouth daily. Take one tablet daily by mouth for 7 days. 08/29/19   Nicholas Lose, MD  fluticasone (FLONASE) 50 MCG/ACT nasal spray Place 2 sprays into both nostrils daily. 12/12/17   Tenna Delaine D, PA-C  nystatin (MYCOSTATIN/NYSTOP) powder Apply 1 application topically 3 (three) times daily. 08/29/19   Nicholas Lose, MD  Spacer/Aero-Holding Chambers (AEROCHAMBER PLUS FLO-VU MEDIUM) MISC 1 each by Other route once for 1 dose. 05/05/20 05/05/20  Hall-Potvin, [place], PA-C  esomeprazole (NEXIUM) 20 MG capsule Take 1 capsule (20 mg total) by mouth daily at 12 noon. 07/04/19 05/05/20  Nicholas Lose, MD    Family History Family History  Problem Relation Age of Onset  . Cancer Mother        skin cancer  . Diabetes Sister         Borderline  . Hypertension Sister   . Heart attack Sister   . Breast cancer Sister 69       maternal half-sister; deceased 83  . Diabetes Brother        Borderline; maternal half-brother  . Cancer Maternal Aunt        stomach and throat; heavy smoker and drinker; deceased 48  . Stomach cancer Maternal Aunt   . Cancer Other        breast/ovarian; daughter of mat cousin with unk. primary at 32; pt's 1st cousin-once-removed  . Other Father        accident  . Cancer Cousin 72       unk. primary; daughter of mat aunt with stomach/throat ca  . Colon cancer Neg Hx   . Esophageal cancer Neg Hx   . Rectal cancer Neg Hx     Social History Social History   Tobacco Use  . Smoking status: Never Smoker  . Smokeless tobacco: Never Used  Vaping Use  . Vaping Use: Never used  Substance Use Topics  . Alcohol use: No  . Drug use: No     Allergies   Latex, Sulfonamide derivatives, Clarithromycin, Crestor [rosuvastatin], Livalo [pitavastatin], Statins, and Ciprofloxacin hcl   Review of Systems Review of Systems  Constitutional: Negative for fatigue and fever.  HENT: Positive for congestion, postnasal drip and sore throat. Negative for dental problem, ear pain, sinus pain, trouble swallowing and voice change.   Eyes: Negative for pain, redness and visual disturbance.  Respiratory: Positive for cough. Negative for shortness of breath.   Cardiovascular: Negative for chest pain and palpitations.  Gastrointestinal: Negative for abdominal pain, diarrhea and vomiting.  Musculoskeletal: Negative for arthralgias and myalgias.  Skin: Negative for rash and wound.  Neurological: Negative for syncope and headaches.     Physical Exam Triage Vital Signs ED Triage Vitals  Enc Vitals Group     BP      Pulse      Resp      Temp      Temp src      SpO2      Weight      Height      Head Circumference      Peak Flow      Pain Score      Pain Loc      Pain Edu?      Excl. in [place]?    No  data  found.  Updated Vital Signs BP 138/78 (BP Location: Left Arm)   Pulse 63   Temp 98.4 F (36.9 C)   Resp 15   Ht 5\' 3"  (1.6 m)   Wt 161 lb 9.6 oz (73.3 kg)   LMP 07/06/1985 (Approximate)   SpO2 98%   BMI 28.63 kg/m   Visual Acuity Right Eye Distance:   Left Eye Distance:   Bilateral Distance:    Right Eye Near:   Left Eye Near:    Bilateral Near:     Physical Exam Constitutional:      General: She is not in acute distress.    Appearance: She is not ill-appearing or diaphoretic.  HENT:     Head: Normocephalic and atraumatic.     Mouth/Throat:     Mouth: Mucous membranes are moist.     Pharynx: Oropharynx is clear. No oropharyngeal exudate or posterior oropharyngeal erythema.  Eyes:     General: No scleral icterus.    Conjunctiva/sclera: Conjunctivae normal.     Pupils: Pupils are equal, round, and reactive to light.  Neck:     Comments: Trachea midline, negative JVD Cardiovascular:     Rate and Rhythm: Normal rate and regular rhythm.     Heart sounds: No murmur heard.  No gallop.   Pulmonary:     Effort: Pulmonary effort is normal. No respiratory distress.     Breath sounds: No wheezing, rhonchi or rales.  Musculoskeletal:     Cervical back: Neck supple. No tenderness.  Lymphadenopathy:     Cervical: No cervical adenopathy.  Skin:    Capillary Refill: Capillary refill takes less than 2 seconds.     Coloration: Skin is not jaundiced or pale.     Findings: No rash.  Neurological:     General: No focal deficit present.     Mental Status: She is alert and oriented to person, place, and time.      UC Treatments / Results  Labs (all labs ordered are listed, but only abnormal results are displayed) Labs Reviewed  NOVEL CORONAVIRUS, NAA    EKG   Radiology No results found.  Procedures Procedures (including critical care time)  Medications Ordered in UC Medications - No data to display  Initial Impression / Assessment and Plan / UC Course  I  have reviewed the triage vital signs and the nursing notes.  Pertinent labs & imaging results that were available during my care of the patient were reviewed by me and considered in my medical decision making (see chart for details).     Patient afebrile, nontoxic, with SpO2 98%.  Covid PCR pending.  Patient to quarantine until results are back.  We will treat supportively as outlined below.  Return precautions discussed, patient verbalized understanding and is agreeable to plan. Final Clinical Impressions(s) / UC Diagnoses   Final diagnoses:  URI with cough and congestion     Discharge Instructions     Tessalon for cough. Start flonase, atrovent nasal spray for nasal congestion/drainage. You can use over the counter nasal saline rinse such as neti pot for nasal congestion. Keep hydrated, your urine should be clear to pale yellow in color. Tylenol/motrin for fever and pain. Monitor for any worsening of symptoms, chest pain, shortness of breath, wheezing, swelling of the throat, go to the emergency department for further evaluation needed.     ED Prescriptions    Medication Sig Dispense Auth. Provider   benzonatate (TESSALON) 100 MG capsule Take 1 capsule (100 mg  total) by mouth every 8 (eight) hours. 21 capsule Hall-Potvin, [place], PA-C   albuterol (VENTOLIN HFA) 108 (90 Base) MCG/ACT inhaler Inhale 2 puffs into the lungs every 6 (six) hours as needed for wheezing or shortness of breath. 8 g Hall-Potvin, [place], PA-C   Spacer/Aero-Holding Chambers (AEROCHAMBER PLUS FLO-VU MEDIUM) MISC 1 each by Other route once for 1 dose. 1 each Hall-Potvin, [place], PA-C   chlorpheniramine-HYDROcodone (TUSSIONEX PENNKINETIC ER) 10-8 MG/5ML SUER Take 5 mLs by mouth at bedtime as needed for cough. 70 mL Hall-Potvin, [place], PA-C     PDMP not reviewed this encounter.   Neldon [place], [place] 05/05/20 [id number]

## 2020-05-05 NOTE — ED Triage Notes (Signed)
Patient c/o cough,sore throat, sneeze, and headache since Saturday night.   Patient stated cough has progressively became worst.   Patient has taken OTC cough and cold medication w/o any relief of symptoms.   Patient denies any SOB, fever.

## 2020-05-05 NOTE — Telephone Encounter (Signed)
LM with husband that it is OK for her to take Letrozole every other day

## 2020-05-05 NOTE — Discharge Instructions (Addendum)

## 2020-05-07 LAB — NOVEL CORONAVIRUS, NAA: SARS-CoV-2, NAA: NOT DETECTED

## 2020-05-07 LAB — SARS-COV-2, NAA 2 DAY TAT

## 2020-05-09 ENCOUNTER — Ambulatory Visit: Payer: TRICARE For Life (TFL) | Admitting: Internal Medicine

## 2020-05-23 DIAGNOSIS — M25511 Pain in right shoulder: Secondary | ICD-10-CM | POA: Diagnosis not present

## 2020-05-23 DIAGNOSIS — Z79899 Other long term (current) drug therapy: Secondary | ICD-10-CM | POA: Diagnosis not present

## 2020-05-23 DIAGNOSIS — M545 Low back pain, unspecified: Secondary | ICD-10-CM | POA: Diagnosis not present

## 2020-05-23 DIAGNOSIS — M25552 Pain in left hip: Secondary | ICD-10-CM | POA: Diagnosis not present

## 2020-05-23 DIAGNOSIS — E559 Vitamin D deficiency, unspecified: Secondary | ICD-10-CM | POA: Diagnosis not present

## 2020-05-23 DIAGNOSIS — R5383 Other fatigue: Secondary | ICD-10-CM | POA: Diagnosis not present

## 2020-05-23 DIAGNOSIS — M160 Bilateral primary osteoarthritis of hip: Secondary | ICD-10-CM | POA: Diagnosis not present

## 2020-05-23 DIAGNOSIS — M25551 Pain in right hip: Secondary | ICD-10-CM | POA: Diagnosis not present

## 2020-05-23 DIAGNOSIS — M5136 Other intervertebral disc degeneration, lumbar region: Secondary | ICD-10-CM | POA: Diagnosis not present

## 2020-05-26 ENCOUNTER — Telehealth: Payer: Self-pay | Admitting: Cardiovascular Disease

## 2020-05-26 ENCOUNTER — Ambulatory Visit (INDEPENDENT_AMBULATORY_CARE_PROVIDER_SITE_OTHER): Payer: Medicare PPO | Admitting: Physician Assistant

## 2020-05-26 ENCOUNTER — Encounter: Payer: Self-pay | Admitting: Physician Assistant

## 2020-05-26 ENCOUNTER — Other Ambulatory Visit (INDEPENDENT_AMBULATORY_CARE_PROVIDER_SITE_OTHER): Payer: Medicare PPO

## 2020-05-26 VITALS — BP 174/100 | HR 60 | Ht 62.75 in | Wt 161.5 lb

## 2020-05-26 DIAGNOSIS — K529 Noninfective gastroenteritis and colitis, unspecified: Secondary | ICD-10-CM

## 2020-05-26 DIAGNOSIS — Z8719 Personal history of other diseases of the digestive system: Secondary | ICD-10-CM

## 2020-05-26 DIAGNOSIS — K589 Irritable bowel syndrome without diarrhea: Secondary | ICD-10-CM

## 2020-05-26 DIAGNOSIS — R140 Abdominal distension (gaseous): Secondary | ICD-10-CM

## 2020-05-26 LAB — BASIC METABOLIC PANEL
BUN: 13 mg/dL (ref 6–23)
CO2: 29 mEq/L (ref 19–32)
Calcium: 9.6 mg/dL (ref 8.4–10.5)
Chloride: 105 mEq/L (ref 96–112)
Creatinine, Ser: 0.86 mg/dL (ref 0.40–1.20)
GFR: 68.52 mL/min (ref >60.00)
Glucose, Bld: 98 mg/dL (ref 70–99)
Potassium: 3.9 mEq/L (ref 3.5–5.1)
Sodium: 140 mEq/L (ref 135–145)

## 2020-05-26 MED ORDER — HYOSCYAMINE SULFATE 0.125 MG SL SUBL: 0.1250 mg | SUBLINGUAL_TABLET | SUBLINGUAL | 4 refills | Status: AC | PRN

## 2020-05-26 NOTE — Telephone Encounter (Signed)
Spoke with pt, she seems very anxious on the phone, she reports that lately she feels like her heart is switching back and forth. When this happens she reports she feels lightheaded. She is also having times of her heart beating very fast and she will feel faint. She reports this has been going on for several months now and today her bp was 174/100 but her heart rate was low and she was told to cut her metoprolol in 1/2. She was encouraged to go to the nearest place to get an EKG when she is having both of these feelings so we can tell what rhythm she is in. Follow up scheduled with dr Claiborne Billings next week. Patient voiced understanding to go to the ER if she were to have a change in symptoms or if they worsen.

## 2020-05-26 NOTE — Telephone Encounter (Signed)
Patient c/o Palpitations:  High priority if patient c/o lightheadedness, shortness of breath, or chest pain  How long have you had palpitations/irregular HR/ Afib? Are you having the symptoms now? Occurring for 3-4 months, is having symptoms now   1) Are you currently experiencing lightheadedness, SOB or CP? Lightheadedness   2) Do you have a history of afib (atrial fibrillation) or irregular heart rhythm? Yes  3) Have you checked your BP or HR? (document readings if available): 174/100 (states may be the reason for lightheadedness)   4) Are you experiencing any other symptoms? Excessive Fatigue   Diva is calling stating her irregular HR has progressively been getting worse over the past few months. She states she also has symptoms of SOB and excessive fatigue and some lightheadedness. Curtisha's PCP advised her yesterday that her HR was slow averaging at 71 yesterday, so she cut her metoprolol dosage in half. Zahrah is now taking 25 MG's of metoprolol daily. Please advise.

## 2020-05-26 NOTE — Progress Notes (Signed)
Subjective:    Patient ID: Tami [name], female    DOB: [birth year], 69 y.o.   MRN: [id number]  HPI Tami [name] is a pleasant 69 year old African-American female, established with Dr. Henrene Pastor who comes in today with complaints of gas and bloating and associated abdominal distention as well as alternating bowel habits. Last office visit was in 2015, and she had colonoscopy in 2018 for surveillance and this was negative with exception of internal hemorrhoids. Patient has history of atrial fibrillation, PSVT, hypertension, prior peptic ulcer disease, history of breast cancer, osteoarthritis and asthma. She has history of IBS and chronic GERD. She says over the past couple of years she has been having increasing issues with fairly chronic gas and bloating and more recently has had abdominal distention that does not seem to resolve. She says she is full and uncomfortable and is propping herself up to sleep due to abdominal discomfort/distention. She is having a lot of belching and burping as well as passage of flatus. Again symptoms worse over the past few months. She is using Pepcid as needed for GERD and does get occasional heartburn. On a daily basis. Bowels continue to be alternating in nature constipation and diarrhea seems to have more problems with frequent stools than constipation, no melena or hematochezia. She says she frequently will feel like she is not completely evacuating her bowel and will have a bowel movement and then have to go back to the bowel through shortly thereafter. She also mentions very infrequent episodes of what sounds like rectal spasm with pain that starts in the sacral area rectum and sometimes will radiate into the inner leg. She is lactose intolerant, generally avoids lactose though she does use flavored creamer in her coffee, no artificial sweeteners. No regular carbonates.  Review of Systems Pertinent positive and negative review of systems were noted in the above HPI  section.  All other review of systems was otherwise negative.  Outpatient Encounter Medications as of 05/26/2020  Medication Sig   albuterol (VENTOLIN HFA) 108 (90 Base) MCG/ACT inhaler Inhale 2 puffs into the lungs every 6 (six) hours as needed for wheezing or shortness of breath.   aspirin EC 81 MG tablet Take 81 mg by mouth daily.   benzonatate (TESSALON) 100 MG capsule Take 1 capsule (100 mg total) by mouth every 8 (eight) hours. (Patient taking differently: Take 100 mg by mouth as needed. )   Calcium Carbonate-Vitamin D (CALTRATE 600+D PO) Take by mouth.   cetirizine (ZYRTEC) 10 MG tablet Take 10 mg by mouth daily.   Cholecalciferol (VITAMIN D3) 25 MCG (1000 UT) CAPS Take 1 capsule by mouth daily.   flecainide (TAMBOCOR) 100 MG tablet Take 0.5 tablets (50 mg total) by mouth 2 (two) times daily.   fluticasone (FLONASE) 50 MCG/ACT nasal spray Place 2 sprays into both nostrils daily.   ibuprofen (ADVIL,MOTRIN) 200 MG tablet Take 200 mg by mouth every 6 (six) hours as needed for headache or moderate pain.   letrozole (FEMARA) 2.5 MG tablet Take 1 tablet (2.5 mg total) by mouth daily.   Meth-Hyo-M Bl-Na Phos-Ph Sal (URIBEL) 118 MG CAPS Take 118 mg by mouth as needed (for bladder). Reported on 09/09/2015   metoprolol tartrate (LOPRESSOR) 100 MG tablet Take 25 mg by mouth daily.   PARoxetine (PAXIL) 20 MG tablet Take 1 tablet by mouth once daily   rosuvastatin (CRESTOR) 5 MG tablet Take 1 tablet (5 mg total) by mouth daily. (Patient taking differently: Take 2.5 mg by mouth daily. )  chlorpheniramine-HYDROcodone (TUSSIONEX PENNKINETIC ER) 10-8 MG/5ML SUER Take 5 mLs by mouth at bedtime as needed for cough. (Patient not taking: Reported on 05/26/2020)   fluconazole (DIFLUCAN) 100 MG tablet Take 1 tablet (100 mg total) by mouth daily. Take one tablet daily by mouth for 7 days. (Patient not taking: Reported on 05/26/2020)   hyoscyamine (LEVSIN SL) 0.125 MG SL tablet Place 1 tablet  (0.125 mg total) under the tongue every 4 (four) hours as needed (Abdominal Pain, spasm, IBS).   [DISCONTINUED] Cholecalciferol (VITAMIN D3) 5000 units CAPS Take 5,000 Units by mouth 2 (two) times daily.    [DISCONTINUED] esomeprazole (NEXIUM) 20 MG capsule Take 1 capsule (20 mg total) by mouth daily at 12 noon.   [DISCONTINUED] metoprolol succinate (TOPROL-XL) 50 MG 24 hr tablet TAKE 1 TABLET BY MOUTH DAILY. TAKE WITH OR IMMEDIATELY FOLLOWING A MEAL.   [DISCONTINUED] nystatin (MYCOSTATIN/NYSTOP) powder Apply 1 application topically 3 (three) times daily.   Facility-Administered Encounter Medications as of 05/26/2020  Medication   0.9 %  sodium chloride infusion   Allergies  Allergen Reactions   Latex Swelling and Rash    Skin swelling    Sulfonamide Derivatives Anaphylaxis, Shortness Of Breath, Nausea Only and Other (See Comments)    Sweating, headache    Clarithromycin Nausea Only and Other (See Comments)    Headache    Crestor [Rosuvastatin] Other (See Comments)    Muscle aches, pain, weakness in lower extremities   Livalo [Pitavastatin] Other (See Comments)    Leg pain   Statins Other (See Comments)    Muscle soreness and weakness w/ Rosuvastatin and Zetia.   Ciprofloxacin Hcl Nausea Only, Palpitations and Other (See Comments)    Headache    Patient Active Problem List   Diagnosis Date Noted   Pelvic and perineal pain 08/19/2017   Right hip pain 07/26/2017   Weakness 07/26/2017   Gait abnormality 07/26/2017   Depression 04/23/2016   Nausea without vomiting 04/23/2016   Trigger finger, acquired 03/26/2016   Swelling of both ankles 12/18/2015   First degree heart block 12/18/2015   Abdominal pain    Abnormal LFTs    Oral candidiasis 05/14/2014   Dehydration 05/14/2014   Generalized pain 05/14/2014   PAF (paroxysmal atrial fibrillation) ([place]) 05/14/2014   PUD (peptic ulcer disease) 05/14/2014   Transaminitis 05/14/2014   Normocytic anemia  05/14/2014   Epigastric abdominal pain 05/14/2014   Family history of malignant neoplasm of breast    Breast cancer of lower-inner quadrant of right female breast ([place]) 02/14/2014   Hyperlipidemia 05/23/2013   Caregiver stress 10/23/2012   Osteoporosis, unspecified 08/24/2012   DDD (degenerative disc disease), lumbar 08/23/2012   Female stress incontinence 05/28/2012   Hydronephrosis 05/28/2012   Numbness and tingling of right arm and leg 08/23/2011   Essential hypertension 09/14/2010   Abdominal pain, epigastric 09/14/2010   IRRITABLE BOWEL SYNDROME 12/29/2009   OSTEOARTHRITIS 12/29/2009   CARCINOMA IN SITU, 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   PSVT 07/11/2008   GERD 06/24/2000   CANDIDIASIS, ESOPHAGEAL 04/19/1995   ASTHMA UNSPECIFIED WITH EXACERBATION 04/19/1995   Social History   Socioeconomic History   Marital status: Married    Spouse name: Gwyndolyn Saxon    Number of children: 2   Years of education: 16   Highest education level: Bachelor's degree (e.g., BA, AB, BS)  Occupational History   Occupation: Retired  Tobacco Use   Smoking status: Never Smoker   Smokeless tobacco: Never Used  Scientific laboratory technician Use: Never used  Substance and Sexual Activity   Alcohol use: No   Drug use: No   Sexual activity: Yes    Partners: Male    Comment: number of sex partners in the last 12 months 1  Other Topics Concern   Not on file  Social History Narrative   Exercise: walks on treadmill three times a week for 30-45 minutes at a time      Lives at home with husband.   2 cups caffeine daily.   Right-handed.      Social Determinants of Health   Financial Resource Strain:    Difficulty of Paying Living Expenses: Not on file  Food Insecurity:    Worried About Charity fundraiser in the Last Year: Not on file   [employer] of Food in the Last Year: Not on file  Transportation Needs:    Lack of Transportation (Medical): Not on file   Lack of Transportation  (Non-Medical): Not on file  Physical Activity:    Days of Exercise per Week: Not on file   Minutes of Exercise per Session: Not on file  Stress:    Feeling of Stress : Not on file  Social Connections:    Frequency of Communication with Friends and Family: Not on file   Frequency of Social Gatherings with Friends and Family: Not on file   Attends Religious Services: Not on file   Active Member of Clubs or Organizations: Not on file   Attends Archivist Meetings: Not on file   Marital Status: Not on file  Intimate Partner Violence:    Fear of Current or Ex-Partner: Not on file   Emotionally Abused: Not on file   Physically Abused: Not on file   Sexually Abused: Not on file    Ms. Norman's family history includes Anemia in her daughter; Breast cancer (age of onset: 75) in her sister; Cancer in her cousin; Diabetes in her brother and sister; Heart attack in her sister; Hypertension in her sister; Other in her father; Skin cancer in her mother; Stomach cancer in her maternal aunt; Throat cancer in her maternal aunt.      Objective:    Vitals:   05/26/20 0958  BP: (!) 174/100  Pulse: 60    Physical Exam; Well-developed well-nourished older African-American female in no acute distress, pleasant height, Weight, 161 BMI 28.4, ambulating with a cane  HEENT; nontraumatic normocephalic, EOMI, PE RR LA, sclera anicteric. Oropharynx; not examined Neck; supple, no JVD Cardiovascular; regular rate and rhythm with S1-S2, no murmur rub or gallop Pulmonary; Clear bilaterally Abdomen; soft, no focal tenderness , some fullness in the upper abdomen no palpable mass or hepatosplenomegaly, bowel sounds are active to hyperactive Rectal; not done today Skin; benign exam, no jaundice rash or appreciable lesions Extremities; no clubbing cyanosis or edema skin warm and dry Neuro/Psych; alert and oriented x4, grossly nonfocal mood and affect appropriate       Assessment &  Plan:   #55  69 year old African-American female with history of IBS with increased issues with abdominal gas and bloating and now with fairly persistent abdominal distention over the past couple of months which is new. Rule out SIBO. Rule out other intra abdominal pathology/ascites  #2 GERD-stable with as needed famotidine use #3 colon cancer surveillance-up-to-date with negative colonoscopy 2018 4. Internal hemorrhoids 5. Osteoarthritis 6. History of breast cancer 7. History of atrial fibrillation and PSVT 8. Asthma 9. 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 Breath testing for SIBO Refill Levsin sublingual every 4-6 hours for as needed use Avoid artificial sweeteners, continue lactose avoidance. Low gas diet Further recommendations pending findings of CT and breath testing.  Tishawna Larouche Genia Harold PA-C 05/26/2020   Cc: No ref. provider found

## 2020-05-26 NOTE — Patient Instructions (Signed)
If you are age 70 or older, your body mass index should be between 23-30. Your Body mass index is 28.84 kg/m. If this is out of the aforementioned range listed, please consider follow up with your Primary Care Provider.  If you are age 70 or younger, your body mass index should be between 19-25. Your Body mass index is 28.84 kg/m. If this is out of the aformentioned range listed, please consider follow up with your Primary Care Provider.   You have been scheduled for a CT scan of the abdomen and pelvis at [facility], 1st floor Radiology. You are scheduled on 06/05/20  at 8:30 am. You should arrive 15 minutes prior to your appointment time for registration.  Please pick up 2 bottles of contrast from [place] at least 3 days prior to your scan. The solution may taste better if refrigerated, but do NOT add ice or any other liquid to this solution. Shake well before drinking.   Please follow the written instructions below on the day of your exam:   1) Do not eat anything after 4:30 am (4 hours prior to your test)   2) Drink 1 bottle of contrast @ 6:30 am (2 hours prior to your exam)  Remember to shake well before drinking and do NOT pour over ice.     Drink 1 bottle of contrast @ 7:30 am (1 hour prior to your exam)   You may take any medications as prescribed with a small amount of water, if necessary. If you take any of the following medications: METFORMIN, GLUCOPHAGE, GLUCOVANCE, AVANDAMET, RIOMET, FORTAMET, [place] MET, JANUMET, GLUMETZA or METAGLIP, you MAY be asked to HOLD this medication 48 hours AFTER the exam.   The purpose of you drinking the oral contrast is to aid in the visualization of your intestinal tract. The contrast solution may cause some diarrhea. Depending on your individual set of symptoms, you may also receive an intravenous injection of x-ray contrast/dye. Plan on being at [facility] for 45 minutes or longer, depending on the type of exam you are having performed.    If you have any questions regarding your exam or if you need to reschedule, you may call [facility] at [phone] between the hours of 8:00 am and 5:00 pm, Monday-Friday.   You have been given a testing kit to check for small intestine bacterial overgrowth (SIBO) which is completed by a company named [facility]. Make sure to return your test in the mail using the return mailing label given to you along with the kit. Your demographic and insurance information have already been sent to the company and they should be in contact with you over the next week regarding this test. [facility] will collect an upfront charge of $99.74 for commercial insurance plans and $209.74 is you are paying cash. Make sure to discuss with [facility] PRIOR to having the test if they have gotten informatoin from your insurance company as to how much your testing will cost out of pocket, if any. Please keep in mind that you will be getting a call from phone number [phone] or a similar number. If you do not hear from them within this time frame, please call our office at [phone].   Your provider has requested that you go to the basement level for lab work before leaving today. Press "B" on the elevator. The lab is located at the first door on the left as you exit the elevator.  START Hyoscyamine dissolve 1  tablet on the tongue every 4-6 hours as needed for Abdominal Pain/Spasms/IBS.  Follow up pending the results of your CT

## 2020-05-26 NOTE — Progress Notes (Signed)
Assessment and plan noted ?

## 2020-06-02 ENCOUNTER — Encounter: Payer: Self-pay | Admitting: Cardiovascular Disease

## 2020-06-02 ENCOUNTER — Ambulatory Visit (INDEPENDENT_AMBULATORY_CARE_PROVIDER_SITE_OTHER): Payer: Medicare PPO | Admitting: Cardiovascular Disease

## 2020-06-02 ENCOUNTER — Other Ambulatory Visit: Payer: Self-pay

## 2020-06-02 DIAGNOSIS — I471 Supraventricular tachycardia: Secondary | ICD-10-CM | POA: Diagnosis not present

## 2020-06-02 DIAGNOSIS — I10 Essential (primary) hypertension: Secondary | ICD-10-CM

## 2020-06-02 DIAGNOSIS — E785 Hyperlipidemia, unspecified: Secondary | ICD-10-CM | POA: Diagnosis not present

## 2020-06-02 DIAGNOSIS — Z853 Personal history of malignant neoplasm of breast: Secondary | ICD-10-CM

## 2020-06-02 DIAGNOSIS — G478 Other sleep disorders: Secondary | ICD-10-CM | POA: Diagnosis not present

## 2020-06-02 DIAGNOSIS — I440 Atrioventricular block, first degree: Secondary | ICD-10-CM | POA: Diagnosis not present

## 2020-06-02 DIAGNOSIS — I480 Paroxysmal atrial fibrillation: Secondary | ICD-10-CM | POA: Diagnosis not present

## 2020-06-02 MED ORDER — METOPROLOL TARTRATE 25 MG PO TABS: ORAL_TABLET | ORAL | 11 refills | Status: DC

## 2020-06-02 MED ORDER — METOPROLOL SUCCINATE ER 25 MG PO TB24: 25.0000 mg | ORAL_TABLET | Freq: Every day | ORAL | 1 refills | Status: DC

## 2020-06-02 NOTE — Progress Notes (Signed)
Patient ID: Tami [name], female   DOB: [birth year], 69 y.o.   MRN: [id number]    HPI: [name] is a 69 year old African American female who is a former patient of Dr. Rollene Fare. She presents for a 4 month cardiology evaluation.  [name] has a history of palpitations, paroxysmal supraventricular tachycardia as well as paroxysmal atrial fibrillation which have been detected on remote monitoring. She has been treated with low-dose flecainide 50 mg twice a day in addition to metoprolol succinate 50 mg daily. She also has a history of hyperlipidemia in the past has had LDLs in the 145 range but is intolerant to several statins including Crestor. She also states she developed some myalgias secondary to Zetia. When she was last seen by Dr. Rollene Fare he had written her a prescription for livalo 1 mg but she never took this.  She also has continued problems with low back and hip discomfort and has some sciatica issues. She is followed by Dr. Mayer Camel. She also has undergone MRIs of her back.  An echo Doppler study in February 2012 showed normal systolic and diastolic function. She did not have significant valvular pathology. A nuclear perfusion study in 2010 which was normal.  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  I saw her prior to undergoing her lumpectomy and prior to radiation and chemotherapy.  A subsequent 2-D echo Doppler study on 03/28/2014 showed an ejection fraction at 60-65% and was normal.  She tolerated lumpectomy well and completed chemotherapy and radiation and  is on letrozole for planned 7 years.   I had not seen her since 2017 and last saw her in April 2019.  He done well from a cardiac standpoint and denied any chest pain, palpitations, or palpitations.  I recently saw her,  she was experiencing poor sleep, admitted to snoring and had frequent nocturia.  She was going to bed at 9:30 PM and waking up at 7:30 AM.  She continued to be on flecainide 50 mg twice a day in addition to Toprol-XL 50 mg without recurrent arrhythmia.  Is a history of anxiety and has been on a trip to [place] in addition to fluoxetine.  Because of her poor sleep, I referred her for a sleep study.  On Nov 06, 2017 she underwent a diagnostic polysomnogram.  This was notable in that she did not have any significant obstructive sleep apnea overall.  Her AHI was 0.3/h.  However, she had increased arousals and her RDI was increased to 12.6.  She was unable to achieve any REM sleep and it was possible that potential of the severity of sleep disordered breathing may have been underestimated due to the absence of REM sleep on this study.  She did not have any significant oxygen desaturation with a nadir of 92%.  There was markedly reduced sleep efficiency at 50.9%.  There was soft snoring. She did not have clinically significant periodic limb movements during sleep.  I have not seen her since her last evaluation with me in August 2019 at which time she was maintaining sinus rhythm with flecainide for her PAF/SVT and her blood pressure and lipids were controlled.  She was evaluated by Roby Lofts, PA-C in November 2020 and remained stable from a cardiac standpoint.  She did experience an isolated episode of palpitations 1 month  previously which occurred after she had forgotten to take her medication.  Presently, she has felt well.  She does note some occasional short-lived palpitations which seem to be precipitated by stress.  She has only been taking metoprolol tartrate 25 mg once a day.  She is followed by Dr. Shelia Media.  She is on low-dose rosuvastatin at 5 mg.  She continues to be on flecainide 50 mg which she takes only twice a day.  She is unaware of breakthrough AF.  She had undergone recent laboratory at  [facility].  Hemoglobin 12.9, hematocrit was 38.0.  Creatinine was normal at 0.9.  LFTs were normal.  Ferritin was 82.  TSH 1.07.  B12 1514.  Vitamin D 96.  She has continued to be followed by Dr. Lindi Adie for her remote breast cancer which has been stable.  She presents for reevaluation.    Past Medical History:  Diagnosis Date  . Anemia    as a child  . Anxiety   . Arthritis   . Asthma   . Atrial fibrillation ([place])   . Breast cancer ([place]) 2015  . Depression with anxiety   . Dysrhythmia    hx AF  . Family history of malignant neoplasm of breast   . Fibromyalgia   . Heart palpitations   . Heartburn   . Hyperlipidemia   . Invasive ductal carcinoma of right breast ([place]) 02/08/14   [place]  . Irregular heartbeat   . Malignant neoplasm of breast (female), unspecified site   . Migraine   . Numbness of arm    Right Arm  . Osteopenia   . Osteoporosis   . Personal history of chemotherapy 2015   Right Breast Cancer  . Personal history of radiation therapy 2015   Right Breast Cancer  . Pure hypercholesterolemia   . SVT (supraventricular tachycardia) (H[facility])     Past Surgical History:  Procedure Laterality Date  . 30 Day Monitor  04/10/2009 to 05/23/2009   No End Of Summary Report Atrial Fib,Narrow Complex Tachycardia,Sinus Rhythm   . ABDOMINAL HYSTERECTOMY  1987  . BREAST EXCISIONAL BIOPSY Left   . BREAST LUMPECTOMY Right 2015  . BREAST LUMPECTOMY WITH NEEDLE LOCALIZATION AND AXILLARY SENTINEL LYMPH NODE BX Right 04/02/2014   Procedure: Right Axillary Lymphatic Mapping; Right axillary Sentinal Node Biopsy; Right Partial Mastectomy after wire localization;  Surgeon: Jackolyn Confer, MD;  Location: [facility];  Service: General;  Laterality: Right;  . BREAST SURGERY     Left  . CYSTOSCOPY  04/25/2012   Pt may be a candidate for research anti-incontinence procedure protocol  . Dipyridamole Myoview  05/27/2009   The post stress myocardial perfusion images show  a normal pattern of perfusion in all regions.The post stress ejection fraction is 83 %. Normal  myocardial perfusion imaging This is a low risk scan.  . DOPPLER ECHOCARDIOGRAPHY  08/21/2010   Ejecion Fraction =>55% All chambers are normal in size and function No significant change from 2010.  [name] lower Venous Dopplers  04/11/2009   Normal bilateral lower ext venous duplex  . PORTACATH PLACEMENT N/A 04/26/2014   Procedure: ULTRASOUND GUIDED PORT A CATHE INSERTION;  Surgeon: Jackolyn Confer, MD;  Location: [facility];  Service: General;  Laterality: N/A;  . Upper Arterial Dopplers  06/02/2011   Right Side PPG Demonstrates siginificantly diminshed waveforms with the Adson's maneuver which may suggest Thoracic Outlet Syndrone. This is a abnormal thoracic outlet arterial evaluation.    Allergies  Allergen Reactions  .  Latex Swelling and Rash    Skin swelling   . Sulfonamide Derivatives Anaphylaxis, Shortness Of Breath, Nausea Only and Other (See Comments)    Sweating, headache   . Clarithromycin Nausea Only and Other (See Comments)    Headache   . Crestor [Rosuvastatin] Other (See Comments)    Muscle aches, pain, weakness in lower extremities  . Livalo [Pitavastatin] Other (See Comments)    Leg pain  . Statins Other (See Comments)    Muscle soreness and weakness w/ Rosuvastatin and Zetia.  . Ciprofloxacin Hcl Nausea Only, Palpitations and Other (See Comments)    Headache     Current Outpatient Medications  Medication Sig Dispense Refill  . albuterol (VENTOLIN HFA) 108 (90 Base) MCG/ACT inhaler Inhale 2 puffs into the lungs every 6 (six) hours as needed for wheezing or shortness of breath. 8 g 2  . aspirin EC 81 MG tablet Take 81 mg by mouth daily.    . benzonatate (TESSALON) 100 MG capsule Take 1 capsule (100 mg total) by mouth every 8 (eight) hours. (Patient taking differently: Take 100 mg by mouth as needed. ) 21 capsule 0  . Calcium Carbonate-Vitamin D (CALTRATE 600+D PO) Take by mouth.    .  cetirizine (ZYRTEC) 10 MG tablet Take 10 mg by mouth daily.    . Cholecalciferol (VITAMIN D3) 25 MCG (1000 UT) CAPS Take 1 capsule by mouth daily.    . flecainide (TAMBOCOR) 100 MG tablet Take 0.5 tablets (50 mg total) by mouth 2 (two) times daily. 90 tablet 3  . fluticasone (FLONASE) 50 MCG/ACT nasal spray Place 2 sprays into both nostrils daily. 16 g 3  . hyoscyamine (LEVSIN SL) 0.125 MG SL tablet Place 1 tablet (0.125 mg total) under the tongue every 4 (four) hours as needed (Abdominal Pain, spasm, IBS). 60 tablet 4  . ibuprofen (ADVIL,MOTRIN) 200 MG tablet Take 200 mg by mouth every 6 (six) hours as needed for headache or moderate pain.    [name] letrozole (FEMARA) 2.5 MG tablet Take 1 tablet (2.5 mg total) by mouth daily. 90 tablet 3  . Meth-Hyo-M Bl-Na Phos-Ph Sal (URIBEL) 118 MG CAPS Take 118 mg by mouth as needed (for bladder). Reported on 09/09/2015  2  . PARoxetine (PAXIL) 20 MG tablet Take 1 tablet by mouth once daily 90 tablet 0  . metoprolol succinate (TOPROL XL) 25 MG 24 hr tablet Take 1 tablet (25 mg total) by mouth daily. 90 tablet 1  . metoprolol tartrate (LOPRESSOR) 25 MG tablet Take 1 tablet (25 mg) as needed for palpitations 30 tablet 11  . rosuvastatin (CRESTOR) 5 MG tablet Take 2.5 mg by mouth daily. (Patient not taking: Reported on 06/02/2020)     Current Facility-Administered Medications  Medication Dose Route Frequency Provider Last Rate Last Admin  . 0.9 %  sodium chloride infusion  500 mL Intravenous Continuous Irene Shipper, MD        Social history is notable in that she is married and has 2 children and 3 grandchildren. 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 There is no tobacco or alcohol use.   Family History  Problem Relation Age of Onset  . Skin cancer Mother   . Diabetes Sister        Borderline  . Hypertension Sister   . Heart attack Sister   . Breast cancer Sister 72       maternal half-sister; deceased 70  .  Diabetes Brother  Borderline; maternal half-brother  . Stomach cancer Maternal Aunt   . Throat cancer Maternal Aunt        stomach and throat; heavy smoker and drinker; deceased 73  . Cancer Cousin        breast/ovarian; daughter of mat cousin with unk. primary at 29; pt's 1st cousin-once-removed  . Other Father        accident  . Anemia Daughter        IDA  . Colon cancer Neg Hx   . Esophageal cancer Neg Hx   . Rectal cancer Neg Hx     ROS General: Negative; No fevers, chills, or night sweats;  HEENT: Negative; No changes in vision or hearing, sinus congestion, difficulty swallowing Pulmonary: Negative; No cough, wheezing, shortness of breath, hemoptysis Cardiovascular: See HPI Positive for occasional ankle swelling GI: Negative; No nausea, vomiting, diarrhea, or abdominal pain GU: Negative; No dysuria, hematuria, or difficulty voiding Musculoskeletal: Negative; no myalgias, joint pain, or weakness Hematologic/Oncology: As above, positive for invasive ductal carcinoma of the right breast .  Status post chemotherapy and radiation treatment following lumpectomy Endocrine: Negative; no heat/cold intolerance; no diabetes Neuro: Negative; no changes in balance, headaches Skin: Negative; No rashes or skin lesions Psychiatric: Negative; No behavioral problems, depression Sleep: Nonrestorative sleep, snoring, frequent nocturia, daytime sleepiness, no bruxism, restless legs, hypnogognic hallucinations, no cataplexy Other comprehensive 14 point system review is negative.   PE BP (!) 164/80   Pulse (!) 57   Ht 5' 2"  (1.575 m)   Wt 159 lb 12.8 oz (72.5 kg)   LMP 07/06/1985 (Approximate)   BMI 29.23 kg/m    Repeat blood pressure by me was elevated at 152/80  Wt Readings from Last 3 Encounters:  06/02/20 159 lb 12.8 oz (72.5 kg)  05/26/20 161 lb 8 oz (73.3 kg)  05/05/20 161 lb 9.6 oz (73.3 kg)   General: Alert, oriented, no distress.  Skin: normal turgor, no rashes, warm  and dry HEENT: Normocephalic, atraumatic. Pupils equal round and reactive to light; sclera anicteric; extraocular muscles intact;  Nose without nasal septal hypertrophy Mouth/Parynx benign; Mallinpatti scale 3 Neck: No JVD, no carotid bruits; normal carotid upstroke Lungs: clear to ausculatation and percussion; no wheezing or rales Chest wall: without tenderness to palpitation Heart: PMI not displaced, RRR, s1 s2 normal, 1/6 systolic murmur, no diastolic murmur, no rubs, gallops, thrills, or heaves Abdomen: soft, nontender; no hepatosplenomehaly, BS+; abdominal aorta nontender and not dilated by palpation. Back: no CVA tenderness Pulses 2+ Musculoskeletal: full range of motion, normal strength, no joint deformities Extremities: no clubbing cyanosis or edema, Homan's sign negative  Neurologic: grossly nonfocal; Cranial nerves grossly wnl Psychologic: Normal mood and affect   ECG (independently read by me): ECG (independently read by me): Sinus bradycardia at 57 bpm with first-degree AV block, PR interval 262 ms.  No ectopy.  Nonspecific T changes.  August 2019 ECG (independently read by me): Normal sinus rhythm at 60 bpm.  First-degree AV block, PR interval 236 ms per;  Nonspecific T changes.  April 2019 ECG (independently read by me): Sinus bradycardia 55 bpm with first-degree AV block.  PR interval 258 ms.  June 2017 ECG (independently read by me): Sinus bradycardia 55 bpm.  First-degree AV block with a PR interval at 250 ms.  Nonspecific ST-T changes.  April 2016 ECG (independently read by me): Normal sinus rhythm at 63 bpm.  First-degree AV block with a PR interval at 254 ms.  QTc interval 49 ms.  Nonspecific  T changes.  September 2015 ECG sinus rhythm with first-degree AV block.  Nonspecific T changes V1 through V3.  Prior ECG: Sinus rhythm at 57 beats per minute, with first degree AV block with PR interval 252 ms. QTc interval is normal at 389 ms. Nonspecific T  changes.  LABS:  BMP Latest Ref Rng & Units 05/26/2020 05/09/2019 01/24/2018  Glucose 70 - 99 mg/dL 98 87 98  BUN 6 - 23 mg/dL 13 7(L) 8  Creatinine 0.40 - 1.20 mg/dL 0.86 0.89 0.89  BUN/Creat Ratio 12 - 28 - 8(L) 9(L)  Sodium 135 - 145 mEq/L 140 145(H) 142  Potassium 3.5 - 5.1 mEq/L 3.9 4.3 4.3  Chloride 96 - 112 mEq/L 105 108(H) 104  CO2 19 - 32 mEq/L 29 21 22   Calcium 8.4 - 10.5 mg/dL 9.6 9.6 9.6     Hepatic Function Latest Ref Rng & Units 05/09/2019 01/24/2018 10/12/2017  Total Protein 6.0 - 8.5 g/dL 7.5 7.3 7.2  Albumin 3.8 - 4.8 g/dL 4.6 4.5 4.4  AST 0 - 40 IU/L 24 21 30   ALT 0 - 32 IU/L 23 17 33(H)  Alk Phosphatase 39 - 117 IU/L 114 110 115  Total Bilirubin 0.0 - 1.2 mg/dL 0.5 0.4 0.4     CBC Latest Ref Rng & Units 10/12/2017 05/09/2017 04/22/2016  WBC 3.4 - 10.8 x10E3/uL 3.4 4.2 5.6  Hemoglobin 11.1 - 15.9 g/dL 13.1 12.9 13.3  Hematocrit 34.0 - 46.6 % 37.9 37.0 39.8  Platelets 150 - 379 x10E3/uL 243 275 233   Lab Results  Component Value Date   MCV 93 10/12/2017   MCV 90 05/09/2017   MCV 94.0 04/22/2016    Lab Results  Component Value Date   TSH 2.080 10/12/2017   No results found for: HGBA1C   Lipid Panel     Component Value Date/Time   CHOL 156 05/09/2019 1204   CHOL 237 (H) 06/22/2013 1405   TRIG 102 05/09/2019 1204   TRIG 103 06/22/2013 1405   HDL 65 05/09/2019 1204   HDL 74 06/22/2013 1405   CHOLHDL 2.4 05/09/2019 1204   CHOLHDL 3.2 07/28/2016 1151   VLDL 22 07/28/2016 1151   LDLCALC 73 05/09/2019 1204   LDLCALC 142 (H) 06/22/2013 1405     BNP No results found for: PROBNP  Lipid Panel     Component Value Date/Time   CHOL 156 05/09/2019 1204   CHOL 237 (H) 06/22/2013 1405   TRIG 102 05/09/2019 1204   TRIG 103 06/22/2013 1405   HDL 65 05/09/2019 1204   HDL 74 06/22/2013 1405   CHOLHDL 2.4 05/09/2019 1204   CHOLHDL 3.2 07/28/2016 1151   VLDL 22 07/28/2016 1151   LDLCALC 73 05/09/2019 1204   LDLCALC 142 (H) 06/22/2013 1405      RADIOLOGY: No results found.  IMPRESSION:  1. PAF (paroxysmal atrial fibrillation) ([place])   2. PSVT   3. Essential hypertension   4. Hyperlipidemia, unspecified hyperlipidemia type   5. First degree heart block   6. UARS (upper airway resistance syndrome)   7. History of breast cancer    ASSESSMENT AND PLAN: Ms. Janowicz is a 53 -year-old African-American female who has a history of PSVT/PAF which has been  controlled with low-dose flecainide 50 twice a day combined with beta blocker therapy.  When I last saw her in 2019 she was maintaining sinus rhythm and had stable first-degree AV block.  Presently, she continues to be in sinus rhythm but over the past year  she has noticed some stress mediated short lived palpitations.  Apparently she has only been taking metoprolol tartrate 25 mg daily rather than twice a day.  I have suggested she change this to metoprolol succinate 25 mg which will be long lasting and prevent potential nadir of beta-blockade dosing.  She was also advised to continue the metoprolol tartrate and take this medication on a as needed basis if she continues to experience palpitations intermittently.  If these occur more frequently her dose of metoprolol succinate can be further titrated.  I reviewed her recent laboratory done at [facility].  Her blood pressure today is elevated and we will see with the change to metoprolol succinate has on her blood pressure.  She has a history of hyperlipidemia with remote LDLs at 154 which had improved with low-dose rosuvastatin down to 73 in November 2020.  She continues to have stable first-degree AV block.  Her QTc interval is normal on flecainide.  She is followed by Dr. Lindi Adie for her remote breast cancer which has been stable.  On her prior sleep study she had increased upper airway resistance syndrome, but during that evaluation she was unable to achieve any REM sleep and the overall AHI may have been underestimated if  she had achieved REM sleep.  Some of her psychiatric medication may have suppressed REM sleep.  Presently she believes she is sleeping well.  She was advised to monitor blood pressure closely at home.  She will contact our office if her blood pressure continues to be elevated.  I discussed with her the most recent hypertensive guidelines with her target BP less than 130/80.  I will see her in 3 to 4 months for reevaluation or sooner as needed.   Troy Sine, MD, [facility] 06/04/2020 12:03 PM

## 2020-06-02 NOTE — Patient Instructions (Signed)
Medication Instructions:  BEGIN taking long-acting metoprolol (succinate) 25 mg daily Take you short-acting metoprolol (tartrate) 1 tablet (25 mg) as needed for breakthrough palpitations. Continue to monitor your heart rate and blood pressure daily. *If you need a refill on your cardiac medications before your next appointment, please call your pharmacy*   Lab Work: We will contact your PCP for a copy of your recent blood work If you have labs (blood work) drawn today and your tests are completely normal, you will receive your results only by:  [place] (if you have MyChart) OR  A paper copy in the mail If you have any lab test that is abnormal or we need to change your treatment, we will call you to review the results.   Testing/Procedures: None ordered   Follow-Up: At [facility], you and your health needs are our priority.  As part of our continuing mission to provide you with exceptional heart care, we have created designated Provider Care Teams.  These Care Teams include your primary Cardiologist (physician) and Advanced Practice Providers (APPs -  Physician Assistants and Nurse Practitioners) who all work together to provide you with the care you need, when you need it.  We recommend signing up for the patient portal called "MyChart".  Sign up information is provided on this After Visit Summary.  MyChart is used to connect with patients for Virtual Visits (Telemedicine).  Patients are able to view lab/test results, encounter notes, upcoming appointments, etc.  Non-urgent messages can be sent to your provider as well.   To learn more about what you can do with MyChart, go to [email or web address].    Your next appointment:   3-4 month(s)  The format for your next appointment:   In Person  Provider:   Shelva Majestic, MD   Other Instructions None

## 2020-06-04 ENCOUNTER — Encounter: Payer: Self-pay | Admitting: Cardiovascular Disease

## 2020-06-05 ENCOUNTER — Other Ambulatory Visit: Payer: Self-pay

## 2020-06-05 ENCOUNTER — Encounter (HOSPITAL_COMMUNITY): Payer: Self-pay

## 2020-06-05 ENCOUNTER — Ambulatory Visit (HOSPITAL_COMMUNITY)
Admission: RE | Admit: 2020-06-05 | Discharge: 2020-06-05 | Disposition: A | Discharge status: Home / Self Care | Payer: Medicare PPO | Source: Ambulatory Visit | Attending: Physician Assistant | Admitting: Physician Assistant

## 2020-06-05 DIAGNOSIS — K589 Irritable bowel syndrome without diarrhea: Secondary | ICD-10-CM | POA: Diagnosis not present

## 2020-06-05 DIAGNOSIS — Z8719 Personal history of other diseases of the digestive system: Secondary | ICD-10-CM | POA: Diagnosis not present

## 2020-06-05 DIAGNOSIS — K529 Noninfective gastroenteritis and colitis, unspecified: Secondary | ICD-10-CM | POA: Insufficient documentation

## 2020-06-05 DIAGNOSIS — R140 Abdominal distension (gaseous): Secondary | ICD-10-CM | POA: Diagnosis not present

## 2020-06-05 DIAGNOSIS — R109 Unspecified abdominal pain: Secondary | ICD-10-CM | POA: Diagnosis not present

## 2020-06-05 MED ORDER — IOHEXOL 300 MG/ML  SOLN
100.0000 mL | Freq: Once | INTRAMUSCULAR | Status: AC | PRN
  Administered 2020-06-05: 100 mL via INTRAVENOUS

## 2020-06-16 ENCOUNTER — Other Ambulatory Visit: Payer: Self-pay | Admitting: Medical

## 2020-07-23 ENCOUNTER — Other Ambulatory Visit: Payer: Self-pay | Admitting: Hematology and Oncology

## 2020-07-23 DIAGNOSIS — F419 Anxiety disorder, unspecified: Secondary | ICD-10-CM

## 2020-08-28 DIAGNOSIS — E7800 Pure hypercholesterolemia, unspecified: Secondary | ICD-10-CM | POA: Diagnosis not present

## 2020-08-28 DIAGNOSIS — R7309 Other abnormal glucose: Secondary | ICD-10-CM | POA: Diagnosis not present

## 2020-08-28 DIAGNOSIS — I4891 Unspecified atrial fibrillation: Secondary | ICD-10-CM | POA: Diagnosis not present

## 2020-08-28 DIAGNOSIS — R5383 Other fatigue: Secondary | ICD-10-CM | POA: Diagnosis not present

## 2020-09-04 DIAGNOSIS — R0989 Other specified symptoms and signs involving the circulatory and respiratory systems: Secondary | ICD-10-CM | POA: Diagnosis not present

## 2020-09-04 DIAGNOSIS — Z9889 Other specified postprocedural states: Secondary | ICD-10-CM | POA: Diagnosis not present

## 2020-09-04 DIAGNOSIS — E7800 Pure hypercholesterolemia, unspecified: Secondary | ICD-10-CM | POA: Diagnosis not present

## 2020-09-04 DIAGNOSIS — I471 Supraventricular tachycardia: Secondary | ICD-10-CM | POA: Diagnosis not present

## 2020-09-10 ENCOUNTER — Other Ambulatory Visit: Payer: Self-pay | Admitting: Cardiovascular Disease

## 2020-10-06 ENCOUNTER — Encounter: Payer: Self-pay | Admitting: Cardiovascular Disease

## 2020-10-06 ENCOUNTER — Ambulatory Visit (INDEPENDENT_AMBULATORY_CARE_PROVIDER_SITE_OTHER): Payer: Medicare PPO | Admitting: Cardiovascular Disease

## 2020-10-06 ENCOUNTER — Other Ambulatory Visit: Payer: Self-pay

## 2020-10-06 VITALS — BP 140/68 | HR 59 | Ht 64.0 in | Wt 161.2 lb

## 2020-10-06 DIAGNOSIS — I480 Paroxysmal atrial fibrillation: Secondary | ICD-10-CM

## 2020-10-06 DIAGNOSIS — I10 Essential (primary) hypertension: Secondary | ICD-10-CM

## 2020-10-06 DIAGNOSIS — I471 Supraventricular tachycardia, unspecified: Secondary | ICD-10-CM

## 2020-10-06 DIAGNOSIS — I440 Atrioventricular block, first degree: Secondary | ICD-10-CM | POA: Diagnosis not present

## 2020-10-06 DIAGNOSIS — E785 Hyperlipidemia, unspecified: Secondary | ICD-10-CM

## 2020-10-06 DIAGNOSIS — Z853 Personal history of malignant neoplasm of breast: Secondary | ICD-10-CM

## 2020-10-06 NOTE — Patient Instructions (Signed)
Medication Instructions:  The current medical regimen is effective;  continue present plan and medications.  *If you need a refill on your cardiac medications before your next appointment, please call your pharmacy*   Lab Work: CMET, LIPID in May- come fasting, nothing to eat or drink, no lab appointment needed  If you have labs (blood work) drawn today and your tests are completely normal, you will receive your results only by: [name] MyChart Message (if you have MyChart) OR . A paper copy in the mail If you have any lab test that is abnormal or we need to change your treatment, we will call you to review the results.   Follow-Up: At [facility], you and your health needs are our priority.  As part of our continuing mission to provide you with exceptional heart care, we have created designated Provider Care Teams.  These Care Teams include your primary Cardiologist (physician) and Advanced Practice Providers (APPs -  Physician Assistants and Nurse Practitioners) who all work together to provide you with the care you need, when you need it.  We recommend signing up for the patient portal called "MyChart".  Sign up information is provided on this After Visit Summary.  MyChart is used to connect with patients for Virtual Visits (Telemedicine).  Patients are able to view lab/test results, encounter notes, upcoming appointments, etc.  Non-urgent messages can be sent to your provider as well.   To learn more about what you can do with MyChart, go to [email or web address].    Your next appointment:   6 month(s)  The format for your next appointment:   In Person  Provider:   Shelva Majestic, MD

## 2020-10-06 NOTE — Progress Notes (Signed)
Patient ID: Tami [name], female   DOB: [birth year], 70 y.o.   MRN: [id number]    HPI: Tami [name] is a 70 year old African American female who is a former patient of Dr. Rollene Fare. She presents for a 6 month cardiology evaluation.  Tami [name] has a history of palpitations, paroxysmal supraventricular tachycardia as well as paroxysmal atrial fibrillation which have been detected on remote monitoring. She has been treated with low-dose flecainide 50 mg twice a day in addition to metoprolol succinate 50 mg daily. She also has a history of hyperlipidemia in the past has had LDLs in the 145 range but is intolerant to several statins including Crestor. She also states she developed some myalgias secondary to Zetia. When she was last seen by Dr. Rollene Fare he had written her a prescription for livalo 1 mg but she never took this.  She also has continued problems with low back and hip discomfort and has some sciatica issues. She is followed by Dr. Mayer Camel. She also has undergone MRIs of her back.  An echo Doppler study in February 2012 showed normal systolic and diastolic function. She did not have significant valvular pathology. A nuclear perfusion study in 2010 which was normal.  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  I saw her prior to undergoing her lumpectomy and prior to radiation and chemotherapy.  A subsequent 2-D echo Doppler study on 03/28/2014 showed an ejection fraction at 60-65% and was normal.  She tolerated lumpectomy well and completed chemotherapy and radiation and  is on letrozole for planned 7 years.   I had not seen her since 2017 and last saw her in April 2019.  He done well from a cardiac standpoint and denied any chest pain, palpitations, or palpitations.  I recently saw her,  she was experiencing poor sleep, admitted to snoring and had frequent nocturia.  She was going to bed at 9:30 PM and waking up at 7:30 AM.  She continued to be on flecainide 50 mg twice a day in addition to Toprol-XL 50 mg without recurrent arrhythmia.  Is a history of anxiety and has been on a trip to [place] in addition to fluoxetine.  Because of her poor sleep, I referred her for a sleep study.  On Nov 06, 2017 she underwent a diagnostic polysomnogram.  This was notable in that she did not have any significant obstructive sleep apnea overall.  Her AHI was 0.3/h.  However, she had increased arousals and her RDI was increased to 12.6.  She was unable to achieve any REM sleep and it was possible that potential of the severity of sleep disordered breathing may have been underestimated due to the absence of REM sleep on this study.  She did not have any significant oxygen desaturation with a nadir of 92%.  There was markedly reduced sleep efficiency at 50.9%.  There was soft snoring. She did not have clinically significant periodic limb movements during sleep.  I have not seen her since her last evaluation with me in August 2019 at which time she was maintaining sinus rhythm with flecainide for her PAF/SVT and her blood pressure and lipids were controlled.  She was evaluated by Roby Lofts, PA-C in November 2020 and remained stable from a cardiac standpoint.  She did experience an isolated episode of palpitations 1 month  previously which occurred after she had forgotten to take her medication.  I last saw her in November 2021 at which time she continued to feel well.  She was experiencing occasional short-lived palpitations which seem to be precipitated by stress.  She was only  taking metoprolol tartrate 25 mg once a day.  She is followed by Dr. Shelia Media.  She is on low-dose rosuvastatin at 5 mg.  She continued to be on flecainide 50 mg twice a day.  She was unaware of breakthrough AF.  She had undergone recent  [facility]aboratory [facility].  Hemoglobin 12.9, hematocrit was 38.0.  Creatinine was normal at 0.9.  LFTs were normal.  Ferritin was 82.  TSH 1.07.  B12 1514.  Vitamin D 96.  She has continued to be followed by Dr. Lindi Adie for her remote breast cancer which has been stable.    Since I last saw her, she stopped taking rosuvastatin and has now been on Livalo 2 mg for the past 6 weeks.  She is unaware of any recurrent atrial fibrillation.  She has not had recent laboratory since Livalo initiation.  She denies any chest pain, PND orthopnea.  She presents for evaluation.    Past Medical History:  Diagnosis Date  . Anemia    as a child  . Anxiety   . Arthritis   . Asthma   . Atrial fibrillation ([place])   . Breast cancer ([place]) 2015  . Depression with anxiety   . Dysrhythmia    hx AF  . Family history of malignant neoplasm of breast   . Fibromyalgia   . Heart palpitations   . Heartburn   . Hyperlipidemia   . Invasive ductal carcinoma of right breast ([place]) 02/08/14   [place]  . Irregular heartbeat   . Malignant neoplasm of breast (female), unspecified site   . Migraine   . Numbness of arm    Right Arm  . Osteopenia   . Osteoporosis   . Personal history of chemotherapy 2015   Right Breast Cancer  . Personal history of radiation therapy 2015   Right Breast Cancer  . Pure hypercholesterolemia   . SVT (supraventricular tachycardia) ([facility])     Past Surgical History:  Procedure Laterality Date  . 30 Day Monitor  04/10/2009 to 05/23/2009   No End Of Summary Report Atrial Fib,Narrow Complex Tachycardia,Sinus Rhythm   . ABDOMINAL HYSTERECTOMY  1987  . BREAST EXCISIONAL BIOPSY Left   . BREAST LUMPECTOMY Right 2015  . BREAST LUMPECTOMY WITH NEEDLE LOCALIZATION AND AXILLARY SENTINEL LYMPH NODE BX Right 04/02/2014   Procedure: Right Axillary Lymphatic Mapping; Right axillary Sentinal Node Biopsy; Right Partial Mastectomy after wire localization;  Surgeon: Jackolyn Confer,  MD;  Location: [facility];  Service: General;  Laterality: Right;  . BREAST SURGERY     Left  . CYSTOSCOPY  04/25/2012   Pt may be a candidate for research anti-incontinence procedure protocol  . Dipyridamole Myoview  05/27/2009   The post stress myocardial perfusion images show a normal pattern of perfusion in all regions.The post stress ejection fraction is 83 %. Normal  myocardial perfusion imaging This is a low risk scan.  . DOPPLER ECHOCARDIOGRAPHY  08/21/2010   Ejecion Fraction =>55% All chambers are normal in size and function No significant change from 2010.  [name] lower Venous Dopplers  04/11/2009   Normal bilateral lower ext venous duplex  . PORTACATH PLACEMENT N/A 04/26/2014   Procedure: ULTRASOUND GUIDED PORT A CATHE INSERTION;  Surgeon: Jackolyn Confer, MD;  Location: [facility];  Service: General;  Laterality: N/A;  . Upper Arterial Dopplers  06/02/2011   Right Side PPG Demonstrates siginificantly diminshed waveforms with the Adson's maneuver which may suggest Thoracic Outlet Syndrone. This is a abnormal thoracic outlet arterial evaluation.    Allergies  Allergen Reactions  . Latex Swelling and Rash    Skin swelling   . Sulfonamide Derivatives Anaphylaxis, Shortness Of Breath, Nausea Only and Other (See Comments)    Sweating, headache   . Clarithromycin Nausea Only and Other (See Comments)    Headache   . Crestor [Rosuvastatin] Other (See Comments)    Muscle aches, pain, weakness in lower extremities  . Statins Other (See Comments)    Muscle soreness and weakness w/ Rosuvastatin and Zetia.  . Ciprofloxacin Hcl Nausea Only, Palpitations and Other (See Comments)    Headache     Current Outpatient Medications  Medication Sig Dispense Refill  . albuterol (VENTOLIN HFA) 108 (90 Base) MCG/ACT inhaler Inhale 2 puffs into the lungs every 6 (six) hours as needed for wheezing or shortness of breath. 8 g 2  . aspirin EC 81 MG tablet Take 81 mg by mouth daily.    . benzonatate (TESSALON) 100  MG capsule Take 1 capsule (100 mg total) by mouth every 8 (eight) hours. (Patient taking differently: Take 100 mg by mouth as needed.) 21 capsule 0  . Calcium Carbonate-Vitamin D (CALTRATE 600+D PO) Take by mouth.    . cetirizine (ZYRTEC) 10 MG tablet Take 10 mg by mouth daily.    . Cholecalciferol (VITAMIN D3) 25 MCG (1000 UT) CAPS Take 1 capsule by mouth daily.    . flecainide (TAMBOCOR) 100 MG tablet Take 0.5 tablets (50 mg total) by mouth 2 (two) times daily. 90 tablet 3  . fluticasone (FLONASE) 50 MCG/ACT nasal spray Place 2 sprays into both nostrils daily. 16 g 3  . hyoscyamine (LEVSIN SL) 0.125 MG SL tablet Place 1 tablet (0.125 mg total) under the tongue every 4 (four) hours as needed (Abdominal Pain, spasm, IBS). 60 tablet 4  . ibuprofen (ADVIL,MOTRIN) 200 MG tablet Take 200 mg by mouth every 6 (six) hours as needed for headache or moderate pain.    . Meth-Hyo-M Bl-Na Phos-Ph Sal (URIBEL) 118 MG CAPS Take 118 mg by mouth as needed (for bladder). Reported on 09/09/2015  2  . metoprolol succinate (TOPROL XL) 25 MG 24 hr tablet Take 1 tablet (25 mg total) by mouth daily. 90 tablet 1  . metoprolol tartrate (LOPRESSOR) 25 MG tablet Take 1 tablet (25 mg) as needed for palpitations 30 tablet 11  . PARoxetine (PAXIL) 20 MG tablet Take 1 tablet by mouth once daily 90 tablet 2  . Pitavastatin Calcium (LIVALO) 2 MG TABS Take 2 mg by mouth daily.     Current Facility-Administered Medications  Medication Dose Route Frequency Provider Last Rate Last Admin  . 0.9 %  sodium chloride infusion  500 mL Intravenous Continuous Irene Shipper, MD        Social history is notable in that she is married and has 2 children and 3 grandchildren. 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 There is no tobacco or alcohol use.   Family History  Problem Relation Age of Onset  . Skin cancer Mother   . Diabetes Sister        Borderline  . Hypertension Sister   . Heart attack  Sister   .  Breast cancer Sister 51       maternal half-sister; deceased 75  . Diabetes Brother        Borderline; maternal half-brother  . Stomach cancer Maternal Aunt   . Throat cancer Maternal Aunt        stomach and throat; heavy smoker and drinker; deceased 82  . Cancer Cousin        breast/ovarian; daughter of mat cousin with unk. primary at 49; pt's 1st cousin-once-removed  . Other Father        accident  . Anemia Daughter        IDA  . Colon cancer Neg Hx   . Esophageal cancer Neg Hx   . Rectal cancer Neg Hx     ROS General: Negative; No fevers, chills, or night sweats;  HEENT: Negative; No changes in vision or hearing, sinus congestion, difficulty swallowing Pulmonary: Negative; No cough, wheezing, shortness of breath, hemoptysis Cardiovascular: See HPI Positive for occasional ankle swelling GI: Negative; No nausea, vomiting, diarrhea, or abdominal pain GU: Negative; No dysuria, hematuria, or difficulty voiding Musculoskeletal: Negative; no myalgias, joint pain, or weakness Hematologic/Oncology: As above, positive for invasive ductal carcinoma of the right breast .  Status post chemotherapy and radiation treatment following lumpectomy Endocrine: Negative; no heat/cold intolerance; no diabetes Neuro: Negative; no changes in balance, headaches Skin: Negative; No rashes or skin lesions Psychiatric: Negative; No behavioral problems, depression Sleep: Nonrestorative sleep, snoring, frequent nocturia, daytime sleepiness, no bruxism, restless legs, hypnogognic hallucinations, no cataplexy Other comprehensive 14 point system review is negative.   PE BP 140/68 (BP Location: Left Arm, Patient Position: Sitting)   Pulse (!) 59   Ht _0  (1.626 m)   Wt 161 lb 3.2 oz (73.1 kg)   LMP 07/06/1985 (Approximate)   SpO2 99%   BMI 27.67 kg/m    Repeat blood pressure by me was 118/70  Wt Readings from Last 3 Encounters:  10/06/20 161 lb 3.2 oz (73.1 kg)  06/02/20 159 lb 12.8 oz  (72.5 kg)  05/26/20 161 lb 8 oz (73.3 kg)   General: Alert, oriented, no distress.  Skin: normal turgor, no rashes, warm and dry HEENT: Normocephalic, atraumatic. Pupils equal round and reactive to light; sclera anicteric; extraocular muscles intact;  Nose without nasal septal hypertrophy Mouth/Parynx benign; Mallinpatti scale 3 Neck: No JVD, no carotid bruits; normal carotid upstroke Lungs: clear to ausculatation and percussion; no wheezing or rales Chest wall: without tenderness to palpitation Heart: PMI not displaced, RRR, s1 s2 normal, 1/6 systolic murmur, no diastolic murmur, no rubs, gallops, thrills, or heaves Abdomen: soft, nontender; no hepatosplenomehaly, BS+; abdominal aorta nontender and not dilated by palpation. Back: no CVA tenderness Pulses 2+ Musculoskeletal: full range of motion, normal strength, no joint deformities Extremities: no clubbing cyanosis or edema, Homan's sign negative  Neurologic: grossly nonfocal; Cranial nerves grossly wnl Psychologic: Normal mood and affect   ECG (independently read by me): Sinus bradycardia at 59, 1st degree AV block;  PR 250 msec,   November 2021 ECG (independently read by me):  Sinus bradycardia at 57 bpm with first-degree AV block, PR interval 262 ms.  No ectopy.  Nonspecific T changes.  August 2019 ECG (independently read by me): Normal sinus rhythm at 60 bpm.  First-degree AV block, PR interval 236 ms per;  Nonspecific T changes.  April 2019 ECG (independently read by me): Sinus bradycardia 55 bpm with first-degree AV block.  PR interval 258 ms.  June 2017 ECG (independently read by me): Sinus  bradycardia 55 bpm.  First-degree AV block with a PR interval at 250 ms.  Nonspecific ST-T changes.  April 2016 ECG (independently read by me): Normal sinus rhythm at 63 bpm.  First-degree AV block with a PR interval at 254 ms.  QTc interval 49 ms.  Nonspecific T changes.  September 2015 ECG sinus rhythm with first-degree AV block.   Nonspecific T changes V1 through V3.  Prior ECG: Sinus rhythm at 57 beats per minute, with first degree AV block with PR interval 252 ms. QTc interval is normal at 389 ms. Nonspecific T changes.  LABS:  BMP Latest Ref Rng & Units 05/26/2020 05/09/2019 01/24/2018  Glucose 70 - 99 mg/dL 98 87 98  BUN 6 - 23 mg/dL 13 7(L) 8  Creatinine 0.40 - 1.20 mg/dL 0.86 0.89 0.89  BUN/Creat Ratio 12 - 28 - 8(L) 9(L)  Sodium 135 - 145 mEq/L 140 145(H) 142  Potassium 3.5 - 5.1 mEq/L 3.9 4.3 4.3  Chloride 96 - 112 mEq/L 105 108(H) 104  CO2 19 - 32 mEq/L _0 Calcium 8.4 - 10.5 mg/dL 9.6 9.6 9.6     Hepatic Function Latest Ref Rng & Units 05/09/2019 01/24/2018 10/12/2017  Total Protein 6.0 - 8.5 g/dL 7.5 7.3 7.2  Albumin 3.8 - 4.8 g/dL 4.6 4.5 4.4  AST 0 - 40 IU/L _1 ALT 0 - 32 IU/L 23 17 33(H)  Alk Phosphatase 39 - 117 IU/L 114 110 115  Total Bilirubin 0.0 - 1.2 mg/dL 0.5 0.4 0.4     CBC Latest Ref Rng & Units 10/12/2017 05/09/2017 04/22/2016  WBC 3.4 - 10.8 x10E3/uL 3.4 4.2 5.6  Hemoglobin 11.1 - 15.9 g/dL 13.1 12.9 13.3  Hematocrit 34.0 - 46.6 % 37.9 37.0 39.8  Platelets 150 - 379 x10E3/uL 243 275 233   Lab Results  Component Value Date   MCV 93 10/12/2017   MCV 90 05/09/2017   MCV 94.0 04/22/2016    Lab Results  Component Value Date   TSH 2.080 10/12/2017   No results found for: HGBA1C   Lipid Panel     Component Value Date/Time   CHOL 156 05/09/2019 1204   CHOL 237 (H) 06/22/2013 1405   TRIG 102 05/09/2019 1204   TRIG 103 06/22/2013 1405   HDL 65 05/09/2019 1204   HDL 74 06/22/2013 1405   CHOLHDL 2.4 05/09/2019 1204   CHOLHDL 3.2 07/28/2016 1151   VLDL 22 07/28/2016 1151   LDLCALC 73 05/09/2019 1204   LDLCALC 142 (H) 06/22/2013 1405     BNP No results found for: PROBNP  Lipid Panel     Component Value Date/Time   CHOL 156 05/09/2019 1204   CHOL 237 (H) 06/22/2013 1405   TRIG 102 05/09/2019 1204   TRIG 103 06/22/2013 1405   HDL 65 05/09/2019 1204    HDL 74 06/22/2013 1405   CHOLHDL 2.4 05/09/2019 1204   CHOLHDL 3.2 07/28/2016 1151   VLDL 22 07/28/2016 1151   LDLCALC 73 05/09/2019 1204   LDLCALC 142 (H) 06/22/2013 1405     RADIOLOGY: No results found.  IMPRESSION:  1. Hyperlipidemia, unspecified hyperlipidemia type   2. PAF (paroxysmal atrial fibrillation) ([place])   3. PSVT   4. First degree heart block   5. Essential hypertension   6. History of breast cancer    ASSESSMENT AND PLAN: Tami [name] is a 73 -year-old African-American female who has a history of PSVT/PAF which has been  controlled with low-dose flecainide  50 twice a day combined with beta blocker therapy.  When I saw her in 2019 she was maintaining sinus rhythm and had stable first-degree AV block.  Presently, she continues to feel well and has not had any recurrent atrial fibrillation episodes.  She continues to be on flecainide 50 mg twice a day, metoprolol succinate 25 mg daily and has a prescription for metoprolol tartrate to take as needed.  She developed issues with rosuvastatin which was discontinued and for the past 6 weeks she has been on Livalo 2 mg.  She has tolerated this well.  She has not had recent laboratory.  I have recommended she undergo a comprehensive metabolic panel and lipid studies.  Her ECG today is stable and shows sinus rhythm with normal QTc interval on flecainide.  She continues to have first-degree AV block which is stable.  She will be following up with Dr. Sonny Dandy in May for her remote breast cancer which has been stable and Dr. Scarlette Shorts.  I will contact her regarding her laboratory.  His LDL cholesterol is still increased Zetia will be added to her Livalo regimen.  I will see her in 6 months for reevaluation.   Troy Sine, MD, [facility] 10/11/2020 5:11 PM

## 2020-10-11 ENCOUNTER — Encounter: Payer: Self-pay | Admitting: Cardiovascular Disease

## 2020-10-15 ENCOUNTER — Ambulatory Visit: Payer: Medicare PPO | Admitting: Internal Medicine

## 2020-10-21 ENCOUNTER — Telehealth: Payer: Self-pay | Admitting: Cardiovascular Disease

## 2020-10-21 MED ORDER — LIVALO 2 MG PO TABS
2.0000 mg | ORAL_TABLET | Freq: Every day | ORAL | 3 refills | Status: DC
Start: 2020-10-21 — End: 2020-10-31

## 2020-10-21 NOTE — Telephone Encounter (Signed)
Spoke to pt and confirmed refill for Livalo 2mg .  Refill sent to pt's pharmacy.

## 2020-10-21 NOTE — Telephone Encounter (Signed)
*  STAT* If patient is at the pharmacy, call can be transferred to refill team.   1. Which medications need to be refilled? (please list name of each medication and dose if known) Lavor   2. Which pharmacy/location (including street and city if local pharmacy) is medication to be sent to? [facility] [place]   3. Do they need a 30 day or 90 day supply? Not sure would like 90

## 2020-10-30 ENCOUNTER — Telehealth: Payer: Self-pay | Admitting: Cardiovascular Disease

## 2020-10-30 NOTE — Telephone Encounter (Signed)
Spoke with patient who states that she needs prior authorization for her Livalo medication. Advised patient that I would forward message.   Gave patient healthwell foundation as below for patient to apply for that as well.   Advised patient to call back to office with any issues, questions, or concerns. Patient verbalized understanding.

## 2020-10-30 NOTE — Telephone Encounter (Signed)
Pt c/o medication issue:  1. Name of Medication:Pitavastatin Calcium (LIVALO) 2 MG TABS  2. How are you currently taking this medication (dosage and times per day)? Out of medication for a week.   3. Are you having a reaction (difficulty breathing--STAT)? no  4. What is your medication issue? Patient needs assistance paying for this medication. She states that it is over $1000. This is the only statin that she can take that does not cause a huge amount of pain and she needs to take a statin. Please advise.

## 2020-10-30 NOTE — Telephone Encounter (Signed)
  Patient Assistance:  The [employer] Well foundation offers assistance to help pay for medication copays.  They will cover copays for all cholesterol lowering meds, including statins, fibrates, omega-3 oils, ezetimibe, Repatha, Praluent, Nexletol, Nexlizet.  The cards are usually good for $2,500 or 12 months, whichever comes first. Go to healthwellfoundation.org Click on "Apply Now" Answer questions as to whom is applying (patient or representative) Your disease fund will be "hypercholesterolemia - Medicare access" They will ask questions about finances and which medications you are taking for cholesterol When you submit, the approval is usually within minutes.  You will need to print the card information from the site You will need to show this information to your pharmacy, they will bill your Medicare Part D plan first -then bill Health Well --for the copay.   You can also call them at [phone], although the hold times can be quite long.   Thank you for choosing [facility]

## 2020-10-31 MED ORDER — ZYPITAMAG 2 MG PO TABS: 2.0000 mg | ORAL_TABLET | Freq: Every day | ORAL | 1 refills | Status: DC

## 2020-10-31 NOTE — Telephone Encounter (Signed)
I faxed over a savings card to [place]. I called patient to let her know that I faxed over the saving card. She should get a free 30 day trial and then $10/month after that. Provided her with instructions on how to get another savings card incase pharmacy did not have fax since the web-site does not require info.

## 2020-10-31 NOTE — Telephone Encounter (Signed)
**Note De-Identified Reyann Troop Obfuscation** Livalo PA started through covermymeds. Key: [id number]

## 2020-10-31 NOTE — Telephone Encounter (Addendum)
**Note De-Identified Lashawndra Lampkins Obfuscation** Per message received from Covermymeds as follows: Randol Kern Key: [id number] - PA Case ID: [id number] Outcome: Denied today Before the requested drug can be approved, step therapy is required (trying other medicines first before stepping up to a drug that costs more). The following coverage rules must be met before the requested drug can be approved: has tried or cannot use both of the following: Zypitamag AND ezetimibe. The information we have about your case says you have already tried ezetimibe.  Drug: Livalo 2MG tablets Form: Administrator, sports PA Form  Per advisement from [place], [place]: I removed Pitavastatin/Livalo from the pts med list and added Pitavastatin/Zypitamag 2 mg and e-scribed to [place] to fill #90 with 1 refill.  I called [place] and was advised that the pts ins is covering [place] and the pts cost is $80/90 day supply.  I called the pt and made her aware of this medication change and why it was made. She is aware to contact Dr Evette Georges office at [facility] is she develops any unpleasant side effects after switching to this medication.  She thanked me for calling her to discuss and verbalized understanding to all information given.

## 2020-10-31 NOTE — Telephone Encounter (Signed)
From [facility] Via LPN

## 2020-10-31 NOTE — Telephone Encounter (Signed)
They are both pitavastatin, just different salt forms. They are considered pharmaceutical alternatives of each other. Ok to change rx. Dose would be the same

## 2020-11-10 NOTE — Progress Notes (Incomplete)
Patient Care Team: Janie Morning, DO as PCP - General (Family Medicine) Troy Sine, MD as PCP - Cardiology (Cardiology) Terance Ice, MD (Inactive) (Cardiology) Carolan Clines, MD (Inactive) as Attending Physician (Urology) Azucena Fallen, MD as Consulting Physician (Obstetrics and Gynecology) Nicholas Lose, MD as Consulting Physician (Hematology and Oncology)  DIAGNOSIS: No diagnosis found.  SUMMARY OF ONCOLOGIC HISTORY: Oncology History  Breast cancer of lower-inner quadrant of right female breast ([place])  01/18/2014 Mammogram   U/S Breast: 7 x 7 x 9 mm irregular taller than wide hypoechoic mass with a peripheral echogenic rim in the right breast 4 o'clock position 7 cm from the nipple. No right axillary lymphadenopathy.    02/14/2014 Initial Diagnosis   Breast cancer-right Er 100%, PR 94%, Ki 67: 17%; Her2 Neg ratio 1.74; T1bN0Mo (Clinical Stage 1A)   04/02/2014 Surgery   Right breast lumpectomy: Invasive ductal carcinoma negative for lymph vascular invasion, 1.6 cm, grade 2, 3 SLN negative, reexcision margins benign, ER 100%, PR 94%, HER-2 negative Ki-67 70% T1 C. N0 M0 stage IA: Oncotype DX 29 (19%ROR)   05/07/2014 - 07/09/2014 Chemotherapy   Taxotere Cytoxan adjuvant chemotherapy every 3 weeks x4 cycles (after cycle one hospitalization for pain related to Neulasta and severe gastritis due to steroids)   08/07/2014 - 09/16/2014 Radiation Therapy   Adjuvant radiation therapy   10/04/2014 -  Anti-estrogen oral therapy   Anastrozole 1 mg daily changed to letrozole 09/16/2015 due to hot flashes switched to exemestane 07/02/18 due to myalgias switched back to letrozole 08/02/18 due to severe joint stiffness     CHIEF COMPLIANT: Follow-up of right breast cancer on letrozole therapy  INTERVAL HISTORY: Tami [name] is a 70 y.o. with above-mentioned history of right breast cancer treated with lumpectomy, adjuvant chemotherapy, radiation, and who is currently on antiestrogen  therapy with letrozole. Mammogram on 04/17/20 showed no evidence of malignancy bilaterally.She presents to the clinic today for follow-up.   ALLERGIES:  is allergic to latex, sulfonamide derivatives, clarithromycin, crestor [rosuvastatin], statins, and ciprofloxacin hcl.  MEDICATIONS:  Current Outpatient Medications  Medication Sig Dispense Refill  . albuterol (VENTOLIN HFA) 108 (90 Base) MCG/ACT inhaler Inhale 2 puffs into the lungs every 6 (six) hours as needed for wheezing or shortness of breath. 8 g 2  . aspirin EC 81 MG tablet Take 81 mg by mouth daily.    . benzonatate (TESSALON) 100 MG capsule Take 1 capsule (100 mg total) by mouth every 8 (eight) hours. (Patient taking differently: Take 100 mg by mouth as needed.) 21 capsule 0  . Calcium Carbonate-Vitamin D (CALTRATE 600+D PO) Take by mouth.    . cetirizine (ZYRTEC) 10 MG tablet Take 10 mg by mouth daily.    . Cholecalciferol (VITAMIN D3) 25 MCG (1000 UT) CAPS Take 1 capsule by mouth daily.    . flecainide (TAMBOCOR) 100 MG tablet Take 0.5 tablets (50 mg total) by mouth 2 (two) times daily. 90 tablet 3  . fluticasone (FLONASE) 50 MCG/ACT nasal spray Place 2 sprays into both nostrils daily. 16 g 3  . hyoscyamine (LEVSIN SL) 0.125 MG SL tablet Place 1 tablet (0.125 mg total) under the tongue every 4 (four) hours as needed (Abdominal Pain, spasm, IBS). 60 tablet 4  . ibuprofen (ADVIL,MOTRIN) 200 MG tablet Take 200 mg by mouth every 6 (six) hours as needed for headache or moderate pain.    . Meth-Hyo-M Bl-Na Phos-Ph Sal (URIBEL) 118 MG CAPS Take 118 mg by mouth as needed (for bladder). Reported on  09/09/2015  2  . metoprolol succinate (TOPROL XL) 25 MG 24 hr tablet Take 1 tablet (25 mg total) by mouth daily. 90 tablet 1  . metoprolol tartrate (LOPRESSOR) 25 MG tablet Take 1 tablet (25 mg) as needed for palpitations 30 tablet 11  . PARoxetine (PAXIL) 20 MG tablet Take 1 tablet by mouth once daily 90 tablet 2  . Pitavastatin Magnesium  (ZYPITAMAG) 2 MG TABS Take 2 mg by mouth daily. 90 tablet 1   Current Facility-Administered Medications  Medication Dose Route Frequency Provider Last Rate Last Admin  . 0.9 %  sodium chloride infusion  500 mL Intravenous Continuous Irene Shipper, MD        PHYSICAL EXAMINATION: ECOG PERFORMANCE STATUS: {CHL ONC ECOG VZ:[id number]}  There were no vitals filed for this visit. There were no vitals filed for this visit.  BREAST:*** No palpable masses or nodules in either right or left breasts. No palpable axillary supraclavicular or infraclavicular adenopathy no breast tenderness or nipple discharge. (exam performed in the presence of a chaperone)  LABORATORY DATA:  I have reviewed the data as listed CMP Latest Ref Rng & Units 05/26/2020 05/09/2019 01/24/2018  Glucose 70 - 99 mg/dL 98 87 98  BUN 6 - 23 mg/dL 13 7(L) 8  Creatinine 0.40 - 1.20 mg/dL 0.86 0.89 0.89  Sodium 135 - 145 mEq/L 140 145(H) 142  Potassium 3.5 - 5.1 mEq/L 3.9 4.3 4.3  Chloride 96 - 112 mEq/L 105 108(H) 104  CO2 19 - 32 mEq/L _0 Calcium 8.4 - 10.5 mg/dL 9.6 9.6 9.6  Total Protein 6.0 - 8.5 g/dL - 7.5 7.3  Total Bilirubin 0.0 - 1.2 mg/dL - 0.5 0.4  Alkaline Phos 39 - 117 IU/L - 114 110  AST 0 - 40 IU/L - 24 21  ALT 0 - 32 IU/L - 23 17    Lab Results  Component Value Date   WBC 3.4 10/12/2017   HGB 13.1 10/12/2017   HCT 37.9 10/12/2017   MCV 93 10/12/2017   PLT 243 10/12/2017   NEUTROABS 2.5 05/09/2017    ASSESSMENT & PLAN:  No problem-specific Assessment & Plan notes found for this encounter.    No orders of the defined types were placed in this encounter.  The patient has a good understanding of the overall plan. she agrees with it. she will call with any problems that may develop before the next visit here.  Total time spent: *** mins including face to face time and time spent for planning, charting and coordination of care  Rulon Eisenmenger, MD, MPH 11/10/2020  I, Cloyde Reams Dorshimer, am  acting as scribe for Dr. Nicholas Lose.  {insert scribe attestation}

## 2020-11-11 ENCOUNTER — Other Ambulatory Visit (HOSPITAL_COMMUNITY): Payer: Self-pay | Admitting: *Deleted

## 2020-11-11 ENCOUNTER — Encounter: Payer: Self-pay | Admitting: Internal Medicine

## 2020-11-11 ENCOUNTER — Ambulatory Visit (INDEPENDENT_AMBULATORY_CARE_PROVIDER_SITE_OTHER): Payer: Medicare PPO | Admitting: Internal Medicine

## 2020-11-11 ENCOUNTER — Inpatient Hospital Stay: Payer: Medicare PPO | Attending: Hematology and Oncology | Admitting: Hematology and Oncology

## 2020-11-11 VITALS — BP 146/74 | HR 58 | Ht 64.0 in | Wt 161.1 lb

## 2020-11-11 DIAGNOSIS — R140 Abdominal distension (gaseous): Secondary | ICD-10-CM

## 2020-11-11 DIAGNOSIS — R1312 Dysphagia, oropharyngeal phase: Secondary | ICD-10-CM

## 2020-11-11 DIAGNOSIS — R1319 Other dysphagia: Secondary | ICD-10-CM | POA: Diagnosis not present

## 2020-11-11 DIAGNOSIS — K219 Gastro-esophageal reflux disease without esophagitis: Secondary | ICD-10-CM

## 2020-11-11 DIAGNOSIS — R1310 Dysphagia, unspecified: Secondary | ICD-10-CM

## 2020-11-11 MED ORDER — ESOMEPRAZOLE MAGNESIUM 40 MG PO CPDR: 40.0000 mg | DELAYED_RELEASE_CAPSULE | Freq: Every day | ORAL | 3 refills | Status: DC

## 2020-11-11 NOTE — Assessment & Plan Note (Deleted)
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 status post lumpectomy, Oncotype DX recurrence score 29 intermediate risk 19% risk of recurrence with hormonal therapy alone  Chemotherapy summary: Taxotere and Cytoxan 4 started 05/07/2014 completed 07/09/2014 Long-term chemotherapy toxicities: Neuropathy Radiation therapy: Started 08/07/2014 completed 09/16/14 ---------------------------------------------------------------------------- Current treatment:Anastrozole 1 mg daily started 10/04/14 switched to letrozole 09/16/2015, switched to exemestane12/27/2019 Patient took a 23-monthbreak from antiestrogen therapy and sheresumed 06/30/2018.  We tried a half a tablet but because of breaking unevenly she decided to stay on the 1 tablet a day.  Letrozoletoxicities:  1. Hair loss 2. Constipation 3. Body pains: Especially along her thighs.  She is getting physical therapy.  Recommended taking half tab daily.  Breast Cancer Surveillance: 1. Breast exam5/04/2021: Benign, extremely tender to palpation but no lumps or nodules. 2. Mammogram10/14/2021: Benign. Postsurgical changes. Breast Density CategoryB. 3.  CT CAP 04/05/2020: Benign.  Return to clinic in 1 year for follow-up

## 2020-11-11 NOTE — Patient Instructions (Addendum)
  We have sent the following medications to your pharmacy for you to pick up at your convenience:  Nexium  You have been scheduled for a modified barium swallow on 11/18/2020 at 11:30am. Please arrive 15 minutes prior to your test for registration. You will go to [facility] (1st Floor) for your appointment. Should you need to cancel or reschedule your appointment, please contact [phone] [facility] [place]) or [phone] [place]). _____________________________________________________________________ A Modified Barium Swallow Study, or MBS, is a special x-ray that is taken to check swallowing skills. It is carried out by a Stage manager and a Psychologist, clinical (SLP). During this test, yourmouth, throat, and esophagus, a muscular tube which connects your mouth to your stomach, is checked. The test will help you, your doctor, and the SLP plan what types of foods and liquids are easier for you to swallow. The SLP will also identify positions and ways to help you swallow more easily and safely. What will happen during an MBS? You will be taken to an x-ray room and seated comfortably. You will be asked to swallow small amounts of food and liquid mixed with barium. Barium is a liquid or paste that allows images of your mouth, throat and esophagus to be seen on x-ray. The x-ray captures moving images of the food you are swallowing as it travels from your mouth through your throat and into your esophagus. This test helps identify whether food or liquid is entering your lungs (aspiration). The test also shows which part of your mouth or throat lacks strength or coordination to move the food or liquid in the right direction. This test typically takes 30 minutes to 1 hour to complete. __________________________________________________________  Dennis Bast have been scheduled for an endoscopy. Please follow written instructions given to you at your visit today. If you use inhalers (even only as needed),  please bring them with you on the day of your procedure.

## 2020-11-11 NOTE — Progress Notes (Signed)
HISTORY OF PRESENT ILLNESS:  Tami [name] is a 70 y.o. female with multiple medical problems as listed below.  She has a history of GERD complicated by peptic stricture for which she underwent remote upper endoscopy with esophageal dilation with Dr. Sharlett Iles (August 2008, [address]).  She also has a history of abdominal bloating and constipation.  Patient has had multiple colonoscopies.  Last examination September 2018 was normal.  Repeat exam in 10 years recommended.  Patient presents today with complaints of active reflux and swallowing difficulties.  About 6 months ago she describes an episode of severe reflux with burning regurgitation followed by what sounds like laryngospasm.  Couple months later consuming water, she felt like "it went down the wrong pipe".  Again, she describes what sounds like laryngospasm.  1 additional episode about drinking tea.  In addition to this issue, she has been having back problems with pyrosis.  2 months ago she was placed on Nexium 20 mg daily.  This has helped.  She also describes intermittent dysphagia to solids and pills.  GI review of systems is otherwise remarkable for constipation for which she takes fiber and bloating for which she takes probiotic.  CT scan of the abdomen pelvis June 05, 2020 to evaluate abdominal pain was unremarkable.  She was seen by the GI physician assistant at that time.  Blood work from November shows normal basic metabolic panel  REVIEW OF SYSTEMS:  All non-GI ROS negative unless otherwise stated in the HPI except for visual change, hearing problems, excessive thirst  Past Medical History:  Diagnosis Date  . Anemia    as a child  . Anxiety   . Arthritis   . Asthma   . Atrial fibrillation ([place])   . Breast cancer ([place]) 2015  . Depression with anxiety   . Dysrhythmia    hx AF  . Family history of malignant neoplasm of breast   . Fibromyalgia   . Heart palpitations   . Heartburn   . Hyperlipidemia   . Invasive  ductal carcinoma of right breast ([place]) 02/08/14   Lower Inner Quadrant  . Irregular heartbeat   . Malignant neoplasm of breast (female), unspecified site   . Migraine   . Numbness of arm    Right Arm  . Osteopenia   . Osteoporosis   . Personal history of chemotherapy 2015   Right Breast Cancer  . Personal history of radiation therapy 2015   Right Breast Cancer  . Prediabetes   . Pure hypercholesterolemia   . SVT (supraventricular tachycardia) ([facility])     Past Surgical History:  Procedure Laterality Date  . 30 Day Monitor  04/10/2009 to 05/23/2009   No End Of Summary Report Atrial Fib,Narrow Complex Tachycardia,Sinus Rhythm   . ABDOMINAL HYSTERECTOMY  1987  . BREAST EXCISIONAL BIOPSY Left   . BREAST LUMPECTOMY Right 2015  . BREAST LUMPECTOMY WITH NEEDLE LOCALIZATION AND AXILLARY SENTINEL LYMPH NODE BX Right 04/02/2014   Procedure: Right Axillary Lymphatic Mapping; Right axillary Sentinal Node Biopsy; Right Partial Mastectomy after wire localization;  Surgeon: Jackolyn Confer, MD;  Location: [facility];  Service: General;  Laterality: Right;  . BREAST SURGERY     Left  . CYSTOSCOPY  04/25/2012   Pt may be a candidate for research anti-incontinence procedure protocol  . Dipyridamole Myoview  05/27/2009   The post stress myocardial perfusion images show a normal pattern of perfusion in all regions.The post stress ejection fraction is 83 %. Normal  myocardial perfusion imaging  This is a low risk scan.  . DOPPLER ECHOCARDIOGRAPHY  08/21/2010   Ejecion Fraction =>55% All chambers are normal in size and function No significant change from 2010.  [name] lower Venous Dopplers  04/11/2009   Normal bilateral lower ext venous duplex  . PORTACATH PLACEMENT N/A 04/26/2014   Procedure: ULTRASOUND GUIDED PORT A CATHE INSERTION;  Surgeon: Jackolyn Confer, MD;  Location: [facility];  Service: General;  Laterality: N/A;  . Upper Arterial Dopplers  06/02/2011   Right Side PPG Demonstrates siginificantly diminshed  waveforms with the Adson's maneuver which may suggest Thoracic Outlet Syndrone. This is a abnormal thoracic outlet arterial evaluation.    Social History Tami [name]  reports that she has never smoked. She has never used smokeless tobacco. She reports that she does not drink alcohol and does not use drugs.  family history includes Anemia in her daughter; Breast cancer (age of onset: 3) in her sister; Cancer in her cousin; Diabetes in her brother and sister; Heart attack in her sister; Hypertension in her sister; Other in her father; Skin cancer in her mother; Stomach cancer in her maternal aunt; Throat cancer in her maternal aunt.  Allergies  Allergen Reactions  . Latex Swelling and Rash    Skin swelling   . Sulfonamide Derivatives Anaphylaxis, Shortness Of Breath, Nausea Only and Other (See Comments)    Sweating, headache   . Clarithromycin Nausea Only and Other (See Comments)    Headache   . Crestor [Rosuvastatin] Other (See Comments)    Muscle aches, pain, weakness in lower extremities  . Statins Other (See Comments)    Muscle soreness and weakness w/ Rosuvastatin and Zetia.  . Ciprofloxacin Hcl Nausea Only, Palpitations and Other (See Comments)    Headache        PHYSICAL EXAMINATION: Vital signs: BP (!) 146/74 (BP Location: Left Arm, Patient Position: Sitting, Cuff Size: Normal)   Pulse (!) 58   Ht 5\' 4"  (1.626 m)   Wt 161 lb 2 oz (73.1 kg)   LMP 07/06/1985 (Approximate)   BMI 27.66 kg/m   Constitutional: generally well-appearing, no acute distress.  Uses a cane to ambulate Psychiatric: alert and oriented x3, cooperative Eyes: extraocular movements intact, anicteric, conjunctiva pink Mouth: oral pharynx moist, no lesions Neck: supple no lymphadenopathy Cardiovascular: heart regular rate and rhythm, no murmur Lungs: clear to auscultation bilaterally Abdomen: soft, nontender, nondistended, no obvious ascites, no peritoneal signs, normal bowel sounds, no  organomegaly Rectal: Omitted Extremities: no clubbing, cyanosis, or lower extremity edema bilaterally Skin: no lesions on visible extremities Neuro: No focal deficits.  Cranial nerves intact  ASSESSMENT:  1.  GERD.  Active symptoms.  Improved with low-dose PPI 2.  Episode of reflux as well as 2 episodes of oropharyngeal dysphagia to liquids with probable laryngospasm. 3.  Intermittent solid food and pill dysphagia 4.  History of peptic stricture requiring dilation 5.  Normal colonoscopy 2018 6.  Multiple medical problems   PLAN:  1.  Reflux precautions 2.  Increase Nexium to 40 mg daily.  Medication risks reviewed.  Prescribed. 3.  Schedule modified barium swallow with speech pathology.  Rule out oropharyngeal dysphagia with aspiration.  If so, speech pathology will provide recommendations. 4.  Schedule upper endoscopy with probable esophageal dilation.  Patient is high risk given her comorbidities.The nature of the procedure, as well as the risks, benefits, and alternatives were carefully and thoroughly reviewed with the patient. Ample time for discussion and questions allowed. The patient understood, was satisfied, and  agreed to proceed. 5.  Routine colonoscopy 2028 6.  Ongoing general medical care with Dr. Theda Sers

## 2020-11-18 ENCOUNTER — Ambulatory Visit (HOSPITAL_COMMUNITY)
Admission: RE | Admit: 2020-11-18 | Discharge: 2020-11-18 | Disposition: A | Discharge status: Home / Self Care | Payer: Medicare PPO | Source: Ambulatory Visit | Attending: Internal Medicine | Admitting: Internal Medicine

## 2020-11-18 ENCOUNTER — Other Ambulatory Visit: Payer: Self-pay

## 2020-11-18 DIAGNOSIS — R140 Abdominal distension (gaseous): Secondary | ICD-10-CM

## 2020-11-18 DIAGNOSIS — K219 Gastro-esophageal reflux disease without esophagitis: Secondary | ICD-10-CM

## 2020-11-18 DIAGNOSIS — R1310 Dysphagia, unspecified: Secondary | ICD-10-CM | POA: Insufficient documentation

## 2020-11-18 DIAGNOSIS — R1319 Other dysphagia: Secondary | ICD-10-CM | POA: Insufficient documentation

## 2020-11-18 DIAGNOSIS — R059 Cough, unspecified: Secondary | ICD-10-CM | POA: Diagnosis not present

## 2020-11-18 DIAGNOSIS — R1312 Dysphagia, oropharyngeal phase: Secondary | ICD-10-CM | POA: Insufficient documentation

## 2020-11-18 NOTE — Progress Notes (Signed)
Modified Barium Swallow Progress Note  Patient Details  Name: Tami [name] MRN: [id number] Date of Birth: 1951/04/06  Today's Date: 11/18/2020  Modified Barium Swallow completed.  Full report located under Chart Review in the Imaging Section.  Brief recommendations include the following:  Clinical Impression  Pt's oral-motor exam was unremarkable re: strength and ROM. Subjective measure of vocal quality was grossly intact with pt report of frequent hoarseness. Oral and pharyngeal phases were both within normal limits. ROM and strenth of pharyngeal structures were adequate for tongue base retraction, hyolaryngeal elevation, epiglottic deflection without laryngeal penetration, aspiration or residue. MBS does not diagnose below the level of the UES however esophagus scanned with pill hesitating mid esophagus but transited with addtional thin barium. Education provided for compensatory strategies for GERD. Recommend regular texture, thin liquids and follow up with ENT to further evaluate laryngeal structures and function. Suspect possible paradoxical vocal cord dysfunction and provided her information re: outpatient SLP who treats pt's if they have a diagnosis of PVCD .   Swallow Evaluation Recommendations   Recommended Consults: Consider ENT evaluation   SLP Diet Recommendations: Regular solids;Thin liquid   Liquid Administration via: Cup;Straw   Medication Administration: Whole meds with liquid   Supervision: Patient able to self feed       Postural Changes: Remain semi-upright after after feeds/meals (Comment);Seated upright at 90 degrees   Oral Care Recommendations: Oral care BID        Houston Siren 11/18/2020,12:52 PM   Orbie Pyo Colvin Caroli.Ed Risk analyst [phone] Office [phone]

## 2020-12-02 ENCOUNTER — Encounter: Payer: Self-pay | Admitting: *Deleted

## 2020-12-02 ENCOUNTER — Other Ambulatory Visit: Payer: Self-pay | Admitting: Cardiovascular Disease

## 2020-12-05 DIAGNOSIS — E785 Hyperlipidemia, unspecified: Secondary | ICD-10-CM | POA: Diagnosis not present

## 2020-12-05 LAB — LIPID PANEL
Chol/HDL Ratio: 3 ratio (ref 0.0–4.4)
Cholesterol, Total: 208 mg/dL — ABNORMAL HIGH (ref 100–199)
HDL: 70 mg/dL (ref >39)
LDL Chol Calc (NIH): 117 mg/dL — ABNORMAL HIGH (ref 0–99)
Triglycerides: 120 mg/dL (ref 0–149)
VLDL Cholesterol Cal: 21 mg/dL (ref 5–40)

## 2020-12-05 LAB — COMPREHENSIVE METABOLIC PANEL
ALT: 19 IU/L (ref 0–32)
AST: 21 IU/L (ref 0–40)
Albumin/Globulin Ratio: 1.8 (ref 1.2–2.2)
Albumin: 4.6 g/dL (ref 3.8–4.8)
Alkaline Phosphatase: 112 IU/L (ref 44–121)
BUN/Creatinine Ratio: 9 — ABNORMAL LOW (ref 12–28)
BUN: 8 mg/dL (ref 8–27)
Bilirubin Total: 0.3 mg/dL (ref 0.0–1.2)
CO2: 24 mmol/L (ref 20–29)
Calcium: 9.7 mg/dL (ref 8.7–10.3)
Chloride: 105 mmol/L (ref 96–106)
Creatinine, Ser: 0.89 mg/dL (ref 0.57–1.00)
Globulin, Total: 2.5 g/dL (ref 1.5–4.5)
Glucose: 100 mg/dL — ABNORMAL HIGH (ref 65–99)
Potassium: 4.1 mmol/L (ref 3.5–5.2)
Sodium: 143 mmol/L (ref 134–144)
Total Protein: 7.1 g/dL (ref 6.0–8.5)
eGFR: 70 mL/min/{1.73_m2} (ref >59)

## 2020-12-12 ENCOUNTER — Encounter: Payer: Medicare PPO | Admitting: Internal Medicine

## 2020-12-15 DIAGNOSIS — H52203 Unspecified astigmatism, bilateral: Secondary | ICD-10-CM | POA: Diagnosis not present

## 2020-12-15 DIAGNOSIS — H26493 Other secondary cataract, bilateral: Secondary | ICD-10-CM | POA: Diagnosis not present

## 2020-12-26 ENCOUNTER — Other Ambulatory Visit: Payer: Self-pay

## 2020-12-26 ENCOUNTER — Encounter: Payer: Self-pay | Admitting: Internal Medicine

## 2020-12-26 ENCOUNTER — Ambulatory Visit (AMBULATORY_SURGERY_CENTER): Payer: Medicare PPO | Admitting: Internal Medicine

## 2020-12-26 VITALS — BP 170/82 | HR 54 | Temp 96.6°F | Resp 19 | Ht 64.0 in | Wt 161.0 lb

## 2020-12-26 DIAGNOSIS — R1319 Other dysphagia: Secondary | ICD-10-CM

## 2020-12-26 DIAGNOSIS — K219 Gastro-esophageal reflux disease without esophagitis: Secondary | ICD-10-CM | POA: Diagnosis not present

## 2020-12-26 MED ORDER — SODIUM CHLORIDE 0.9 % IV SOLN: 500.0000 mL | Freq: Once | INTRAVENOUS | Status: DC

## 2020-12-26 NOTE — Op Note (Signed)
[facility] Patient Name: Tami [name] Procedure Date: 12/26/2020 3:58 PM MRN: [id number] Endoscopist: Docia Chuck. Henrene Pastor , MD Age: 70 Referring MD:  Date of Birth: 08-Oct-1950 Gender: Female Account #: [id number] Procedure:                Upper GI endoscopy Indications:              Dysphagia, reflux established. Symptoms improved                            after increasing Nexium at time of office visit.                            Now for endoscopy Medicines:                Monitored Anesthesia Care Procedure:                Pre-Anesthesia Assessment:                           - Prior to the procedure, a History and Physical                            was performed, and patient medications and                            allergies were reviewed. The patient's tolerance of                            previous anesthesia was also reviewed. The risks                            and benefits of the procedure and the sedation                            options and risks were discussed with the patient.                            All questions were answered, and informed consent                            was obtained. Prior Anticoagulants: The patient has                            taken no previous anticoagulant or antiplatelet                            agents. ASA Grade Assessment: II - A patient with                            mild systemic disease. After reviewing the risks                            and benefits, the patient was deemed in  satisfactory condition to undergo the procedure.                           After obtaining informed consent, the endoscope was                            passed under direct vision. Throughout the                            procedure, the patient's blood pressure, pulse, and                            oxygen saturations were monitored continuously. The                            Endoscope was introduced through the  mouth, and                            advanced to the second part of duodenum. The upper                            GI endoscopy was accomplished without difficulty.                            The patient tolerated the procedure well. Scope In: Scope Out: Findings:                 The esophagus was normal.                           The stomach was normal.                           The examined duodenum was normal.                           The cardia and gastric fundus were normal on                            retroflexion. Complications:            No immediate complications. Estimated Blood Loss:     Estimated blood loss: none. Impression:               1. GERD                           2. Normal EGD. Recommendation:           - Patient has a contact number available for                            emergencies. The signs and symptoms of potential                            delayed complications were discussed with the  patient. Return to normal activities tomorrow.                            Written discharge instructions were provided to the                            patient.                           - Resume previous diet.                           - Continue present medications. Docia Chuck. Henrene Pastor, MD 12/26/2020 4:25:51 PM This report has been signed electronically.

## 2020-12-26 NOTE — Patient Instructions (Signed)
YOU HAD AN ENDOSCOPIC PROCEDURE TODAY AT THE [facility]:   Refer to the procedure report that was given to you for any specific questions about what was found during the examination.  If the procedure report does not answer your questions, please call your gastroenterologist to clarify.  If you requested that your care partner not be given the details of your procedure findings, then the procedure report has been included in a sealed envelope for you to review at your convenience later.  YOU SHOULD EXPECT: Some feelings of bloating in the abdomen. Passage of more gas than usual.  Walking can help get rid of the air that was put into your GI tract during the procedure and reduce the bloating. If you had a lower endoscopy (such as a colonoscopy or flexible sigmoidoscopy) you may notice spotting of blood in your stool or on the toilet paper. If you underwent a bowel prep for your procedure, you may not have a normal bowel movement for a few days.  Please Note:  You might notice some irritation and congestion in your nose or some drainage.  This is from the oxygen used during your procedure.  There is no need for concern and it should clear up in a day or so.  SYMPTOMS TO REPORT IMMEDIATELY:    Following upper endoscopy (EGD)  Vomiting of blood or coffee ground material  New chest pain or pain under the shoulder blades  Painful or persistently difficult swallowing  New shortness of breath  Fever of 100F or higher  Black, tarry-looking stools  For urgent or emergent issues, a gastroenterologist can be reached at any hour by calling [phone]. Do not use MyChart messaging for urgent concerns.    DIET:  We do recommend a small meal at first, but then you may proceed to your regular diet.  Drink plenty of fluids but you should avoid alcoholic beverages for 24 hours.  ACTIVITY:  You should plan to take it easy for the rest of today and you should NOT DRIVE or use heavy machinery  until tomorrow (because of the sedation medicines used during the test).    FOLLOW UP: Our staff will call the number listed on your records 48-72 hours following your procedure to check on you and address any questions or concerns that you may have regarding the information given to you following your procedure. If we do not reach you, we will leave a message.  We will attempt to reach you two times.  During this call, we will ask if you have developed any symptoms of COVID 19. If you develop any symptoms (ie: fever, flu-like symptoms, shortness of breath, cough etc.) before then, please call [phone].  If you test positive for Covid 19 in the 2 weeks post procedure, please call and report this information to us.    If any biopsies were taken you will be contacted by phone or by letter within the next 1-3 weeks.  Please call us at [phone] if you have not heard about the biopsies in 3 weeks.    SIGNATURES/CONFIDENTIALITY: You and/or your care partner have signed paperwork which will be entered into your electronic medical record.  These signatures attest to the fact that that the information above on your After Visit Summary has been reviewed and is understood.  Full responsibility of the confidentiality of this discharge information lies with you and/or your care-partner. 

## 2020-12-26 NOTE — Progress Notes (Signed)
PT taken to PACU. Monitors in place. VSS. Report given to RN. 

## 2020-12-26 NOTE — Progress Notes (Signed)
Medical history reviewed with no changes noted. VS assessed by C.W 

## 2020-12-30 ENCOUNTER — Telehealth: Payer: Self-pay | Admitting: *Deleted

## 2020-12-30 ENCOUNTER — Telehealth: Payer: Self-pay

## 2020-12-30 NOTE — Telephone Encounter (Signed)
No answer, no permission to leave a message, B.Aryam Zhan RN.

## 2020-12-30 NOTE — Telephone Encounter (Signed)
Attempted 2nd f/u phone call. No answer. Left message.  °

## 2020-12-31 ENCOUNTER — Telehealth: Payer: Self-pay | Admitting: Cardiovascular Disease

## 2020-12-31 ENCOUNTER — Telehealth: Payer: Self-pay

## 2020-12-31 DIAGNOSIS — H26491 Other secondary cataract, right eye: Secondary | ICD-10-CM | POA: Diagnosis not present

## 2020-12-31 NOTE — Telephone Encounter (Signed)
Patient returning call for lab results. 

## 2020-12-31 NOTE — Telephone Encounter (Signed)
Please see updated encounter  

## 2020-12-31 NOTE — Telephone Encounter (Signed)
Pt updated with lab results and MD's recommendations. Pt state she believes the increase in LDL is the result of changing medication and wanted Dr. Claiborne Billings to be aware that she has since started back on Crestor 5 mg. Pt want to hold off on starting Zetia to see if it helps but wondering if Dr. Claiborne Billings would like her to increase Crestor dose.   Troy Sine, MD  12/30/2020 11:33 AM EDT      Glucose 100; lipid studies increased from 1 year ago with total cholesterol now 208 and LDL cholesterol 117.  Recommend adding Zetia 10 mg to her Livalo formore optimal LDL lowering.

## 2021-01-07 DIAGNOSIS — H26492 Other secondary cataract, left eye: Secondary | ICD-10-CM | POA: Diagnosis not present

## 2021-01-08 NOTE — Telephone Encounter (Signed)
Is patient still on Livalo or is she on Crestor.  If she is on Crestor and seems to tolerate this she can try increasing to 10 mg.  If she is on Livalo and not on Crestor I would add Zetia 10 mg

## 2021-01-13 MED ORDER — ROSUVASTATIN CALCIUM 10 MG PO TABS: 10.0000 mg | ORAL_TABLET | Freq: Every day | ORAL | 3 refills | Status: DC

## 2021-01-13 NOTE — Telephone Encounter (Signed)
Spoke with pt and advised of MD's recommendations. Pt confirmed again that she is not taking Livalo and only taking crestor 5 mg. Pt agreeable to increasing dose to 10 mg and new orders placed.

## 2021-03-19 DIAGNOSIS — Z23 Encounter for immunization: Secondary | ICD-10-CM | POA: Diagnosis not present

## 2021-05-04 ENCOUNTER — Other Ambulatory Visit: Payer: Self-pay | Admitting: Hematology and Oncology

## 2021-05-04 DIAGNOSIS — F419 Anxiety disorder, unspecified: Secondary | ICD-10-CM

## 2021-05-13 ENCOUNTER — Other Ambulatory Visit: Payer: Self-pay | Admitting: Hematology and Oncology

## 2021-05-13 DIAGNOSIS — Z1231 Encounter for screening mammogram for malignant neoplasm of breast: Secondary | ICD-10-CM

## 2021-05-14 ENCOUNTER — Ambulatory Visit: Payer: Medicare PPO

## 2021-05-18 ENCOUNTER — Ambulatory Visit (INDEPENDENT_AMBULATORY_CARE_PROVIDER_SITE_OTHER): Payer: Medicare PPO | Admitting: Cardiovascular Disease

## 2021-05-18 ENCOUNTER — Encounter: Payer: Self-pay | Admitting: Cardiovascular Disease

## 2021-05-18 ENCOUNTER — Other Ambulatory Visit: Payer: Self-pay

## 2021-05-18 DIAGNOSIS — I480 Paroxysmal atrial fibrillation: Secondary | ICD-10-CM

## 2021-05-18 DIAGNOSIS — E7800 Pure hypercholesterolemia, unspecified: Secondary | ICD-10-CM

## 2021-05-18 DIAGNOSIS — I440 Atrioventricular block, first degree: Secondary | ICD-10-CM

## 2021-05-18 DIAGNOSIS — I10 Essential (primary) hypertension: Secondary | ICD-10-CM

## 2021-05-18 DIAGNOSIS — Z853 Personal history of malignant neoplasm of breast: Secondary | ICD-10-CM | POA: Diagnosis not present

## 2021-05-18 DIAGNOSIS — K219 Gastro-esophageal reflux disease without esophagitis: Secondary | ICD-10-CM | POA: Diagnosis not present

## 2021-05-18 DIAGNOSIS — R0789 Other chest pain: Secondary | ICD-10-CM

## 2021-05-18 DIAGNOSIS — E785 Hyperlipidemia, unspecified: Secondary | ICD-10-CM

## 2021-05-18 MED ORDER — AMLODIPINE BESYLATE 5 MG PO TABS: 5.0000 mg | ORAL_TABLET | Freq: Every day | ORAL | 3 refills | Status: DC

## 2021-05-18 NOTE — Patient Instructions (Signed)
Medication Instructions:  Start taking amlodipine 5 mg daily.   Continue all other medications.  *If you need a refill on your cardiac medications before your next appointment, please call your pharmacy*   Lab Work: Get fasting blood work (cmet, cbc, lipids) If you have labs (blood work) drawn today and your tests are completely normal, you will receive your results only by: [place] (if you have MyChart) OR A paper copy in the mail If you have any lab test that is abnormal or we need to change your treatment, we will call you to review the results.   Testing/Procedures:   Your cardiac CT will be scheduled at one of the below locations:   [facility] [address] [place], [place] [address] [phone]   If scheduled at [facility], please arrive at the [facility] main entrance (entrance A) of [facility] 30 minutes prior to test start time. You can use the FREE valet parking offered at the main entrance (encouraged to control the heart rate for the test) Proceed to the [facility] Radiology Department (first floor) to check-in and test prep.   Please follow these instructions carefully (unless otherwise directed):   On the Night Before the Test: Be sure to Drink plenty of water. Do not consume any caffeinated/decaffeinated beverages or chocolate 12 hours prior to your test. Do not take any antihistamines 12 hours prior to your test.   On the Day of the Test: Drink plenty of water until 1 hour prior to the test. Do not eat any food 4 hours prior to the test. You may take your regular medications prior to the test.  Take metoprolol tartrate 25 mg two hours prior to test. FEMALES- please wear underwire-free bra if available, avoid dresses & tight clothing       After the Test: Drink plenty of water. After receiving IV contrast, you may experience a mild flushed feeling. This is normal. On occasion, you may experience a mild rash  up to 24 hours after the test. This is not dangerous. If this occurs, you can take Benadryl 25 mg and increase your fluid intake. If you experience trouble breathing, this can be serious. If it is severe call 911 IMMEDIATELY. If it is mild, please call our office.  Please allow 2-4 weeks for scheduling of routine cardiac CTs. Some insurance companies require a pre-authorization which may delay scheduling of this test.   For non-scheduling related questions, please contact the cardiac imaging nurse navigator should you have any questions/concerns: Marchia Bond, Cardiac Imaging Nurse Navigator Gordy Clement, Cardiac Imaging Nurse Navigator [facility] Direct Office Dial: [phone]   For scheduling needs, including cancellations and rescheduling, please call [place], [phone].    Follow-Up: At [facility] you and your health needs are our priority.  As part of our continuing mission to provide you with exceptional heart care, we have created designated Provider Care Teams.  These Care Teams include your primary Cardiologist (physician) and Advanced Practice Providers (APPs -  Physician Assistants and Nurse Practitioners) who all work together to provide you with the care you need, when you need it.  We recommend signing up for the patient portal called "MyChart".  Sign up information is provided on this After Visit Summary.  MyChart is used to connect with patients for Virtual Visits (Telemedicine).  Patients are able to view lab/test results, encounter notes, upcoming appointments, etc.  Non-urgent messages can be sent to your  provider as well.   To learn more about what you can do with MyChart, go to [email or web address].    Your next appointment:    Follow up after test  The format for your next appointment:   In Person  Provider:   Shelva Majestic, MD

## 2021-05-18 NOTE — Progress Notes (Signed)
Patient ID: Tami [name], female   DOB: [birth year], 70 y.o.   MRN: [id number]    HPI: [name] is a 70 year old African American female who is a former patient of Dr. Rollene Fare. She presents for a 7  month cardiology evaluation.  [name] has a history of palpitations, paroxysmal supraventricular tachycardia as well as paroxysmal atrial fibrillation which have been detected on remote monitoring. She has been treated with low-dose flecainide 50 mg twice a day in addition to metoprolol succinate 50 mg daily. She also has a history of hyperlipidemia in the past has had LDLs in the 145 range but is intolerant to several statins including Crestor. She also states she developed some myalgias secondary to Zetia. When she was last seen by Dr. Rollene Fare he had written her a prescription for livalo 1 mg but she never took this.  She also has continued problems with low back and hip discomfort and has some sciatica issues. She is followed by Dr. Mayer Camel. She also has undergone MRIs of her back.  An echo Doppler study in February 2012 showed normal systolic and diastolic function. She did not have significant valvular pathology. A nuclear perfusion study in 2010 which was normal.  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  I saw her prior to undergoing her lumpectomy and prior to radiation and chemotherapy.  A subsequent 2-D echo Doppler study on 03/28/2014 showed an ejection fraction at 60-65% and was normal.  She tolerated lumpectomy well and completed chemotherapy and radiation and  is on letrozole for planned 7 years.   I had not seen her since 2017 and last saw her in April 2019.  He done well from a cardiac standpoint and denied any chest pain, palpitations, or palpitations.  I recently saw her,  she was experiencing poor sleep, admitted to snoring and had frequent nocturia.  She was going to bed at 9:30 PM and waking up at 7:30 AM.  She continued to be on flecainide 50 mg twice a day in addition to Toprol-XL 50 mg without recurrent arrhythmia.  Is a history of anxiety and has been on a trip to [place] in addition to fluoxetine.  Because of her poor sleep, I referred her for a sleep study.  On Nov 06, 2017 she underwent a diagnostic polysomnogram.  This was notable in that she did not have any significant obstructive sleep apnea overall.  Her AHI was 0.3/h.  However, she had increased arousals and her RDI was increased to 12.6.  She was unable to achieve any REM sleep and it was possible that potential of the severity of sleep disordered breathing may have been underestimated due to the absence of REM sleep on this study.  She did not have any significant oxygen desaturation with a nadir of 92%.  There was markedly reduced sleep efficiency at 50.9%.  There was soft snoring. She did not have clinically significant periodic limb movements during sleep.  I have not seen her since her last evaluation with me in August 2019 at which time she was maintaining sinus rhythm with flecainide for her PAF/SVT and her blood pressure and lipids were controlled.  She was evaluated by Roby Lofts, PA-C in November 2020 and remained stable from a cardiac standpoint.  She did experience an isolated episode of palpitations 1  month previously which occurred after she had forgotten to take her medication.  I last saw her in November 2021 at which time she continued to feel well.  She was experiencing occasional short-lived palpitations which seem to be precipitated by stress.  She was only  taking metoprolol tartrate 25 mg once a day.  She is followed by Dr. Shelia Media.  She is on low-dose rosuvastatin at 5 mg.  She continued to be on flecainide 50 mg twice a day.  She was unaware of breakthrough AF.  She had undergone recent  laboratory at [facility].  Hemoglobin 12.9, hematocrit was 38.0.  Creatinine was normal at 0.9.  LFTs were normal.  Ferritin was 82.  TSH 1.07.  B12 1514.  Vitamin D 96.  She has continued to be followed by Dr. Lindi Adie for her remote breast cancer which has been stable.    I last saw her in April 2022 and since her prior evaluation she had sstopped taking rosuvastatin and was now been on Livalo 2 mg for the past 6 weeks.  She was unaware of any recurrent atrial fibrillation.  She has not had recent laboratory since Livalo initiation.  She denies any chest pain, PND orthopnea.   I recommended that she undergo follow-up laboratory.  She was to follow-up with Dr. Payton Mccallum for her remote breast cancer and Dr. Henrene Pastor from a GI standpoint.  At Dr. Blanch Media most recent evaluation Nexium was increased to 40 mg daily and she was advised on reflux precautions.  She was scheduled to undergo modified barium swallow to rule out oropharyngeal dysphagia with aspiration and he felt she ultimately would need upper endoscopy with probable esophageal dilatation.  Presently, [name] feels well.  She does experience left chest wall tenderness Exertionally.  Laboratory in June 2022 showed a total cholesterol 208 with LDL cholesterol 117.  She apparently is back on her rosuvastatin at 10 mg.  She continues to be on flecainide 150 mg twice a day without recurrent AF and has been taking metoprolol succinate 25 mg daily.  She presents for evaluation.  Past Medical History:  Diagnosis Date   Allergy    Anemia    as a child   Anxiety    Arthritis    Asthma    Atrial fibrillation ([place])    Breast cancer ([place]) 2015   Cataract    Depression with anxiety    Dysrhythmia    hx AF   Family history of malignant neoplasm of breast    Fibromyalgia    GERD (gastroesophageal reflux disease)    Heart palpitations    Heartburn    Hyperlipidemia    Invasive ductal carcinoma of right breast ([place]) 02/08/2014   Lower  Inner Quadrant   Irregular heartbeat    Malignant neoplasm of breast (female), unspecified site    Migraine    Numbness of arm    Right Arm   Osteopenia    Osteoporosis    Personal history of chemotherapy 2015   Right Breast Cancer   Personal history of radiation therapy 2015   Right Breast Cancer   Prediabetes    Pure hypercholesterolemia    SVT (supraventricular tachycardia) ([place])     Past Surgical History:  Procedure Laterality Date   30 Day Monitor  04/10/2009 to 05/23/2009   No End Of Summary Report Atrial Fib,Narrow Complex Tachycardia,Sinus Rhythm    ABDOMINAL HYSTERECTOMY  1987   BREAST EXCISIONAL BIOPSY Left    BREAST LUMPECTOMY Right 2015  BREAST LUMPECTOMY WITH NEEDLE LOCALIZATION AND AXILLARY SENTINEL LYMPH NODE BX Right 04/02/2014   Procedure: Right Axillary Lymphatic Mapping; Right axillary Sentinal Node Biopsy; Right Partial Mastectomy after wire localization;  Surgeon: Jackolyn Confer, MD;  Location: [facility];  Service: General;  Laterality: Right;   BREAST SURGERY     Left   CYSTOSCOPY  04/25/2012   Pt may be a candidate for research anti-incontinence procedure protocol   Dipyridamole Myoview  05/27/2009   The post stress myocardial perfusion images show a normal pattern of perfusion in all regions.The post stress ejection fraction is 83 %. Normal  myocardial perfusion imaging This is a low risk scan.   DOPPLER ECHOCARDIOGRAPHY  08/21/2010   Ejecion Fraction =>55% All chambers are normal in size and function No significant change from 2010.   lower Venous Dopplers  04/11/2009   Normal bilateral lower ext venous duplex   PORTACATH PLACEMENT N/A 04/26/2014   Procedure: ULTRASOUND GUIDED PORT A CATHE INSERTION;  Surgeon: Jackolyn Confer, MD;  Location: [facility];  Service: General;  Laterality: N/A;   Upper Arterial Dopplers  06/02/2011   Right Side PPG Demonstrates siginificantly diminshed waveforms with the Adson's maneuver which may suggest Thoracic Outlet Syndrone.  This is a abnormal thoracic outlet arterial evaluation.    Allergies  Allergen Reactions   Latex Swelling and Rash    Skin swelling    Sulfonamide Derivatives Anaphylaxis, Shortness Of Breath, Nausea Only and Other (See Comments)    Sweating, headache    Clarithromycin Nausea Only and Other (See Comments)    Headache    Crestor [Rosuvastatin] Other (See Comments)    Muscle aches, pain, weakness in lower extremities   Statins Other (See Comments)    Muscle soreness and weakness w/ Rosuvastatin and Zetia.   Ciprofloxacin Hcl Nausea Only, Palpitations and Other (See Comments)    Headache     Current Outpatient Medications  Medication Sig Dispense Refill   albuterol (VENTOLIN HFA) 108 (90 Base) MCG/ACT inhaler Inhale 2 puffs into the lungs every 6 (six) hours as needed for wheezing or shortness of breath. 8 g 2   amLODipine (NORVASC) 5 MG tablet Take 1 tablet (5 mg total) by mouth daily. 90 tablet 3   aspirin EC 81 MG tablet Take 81 mg by mouth daily.     Calcium Carbonate-Vitamin D (CALTRATE 600+D PO) Take by mouth.     cetirizine (ZYRTEC) 10 MG tablet Take 10 mg by mouth daily.     Cholecalciferol (VITAMIN D3) 25 MCG (1000 UT) CAPS Take 1 capsule by mouth daily.     esomeprazole (NEXIUM) 40 MG capsule Take 1 capsule (40 mg total) by mouth daily at 12 noon. 90 capsule 3   flecainide (TAMBOCOR) 100 MG tablet Take 0.5 tablets (50 mg total) by mouth 2 (two) times daily. 90 tablet 3   fluticasone (FLONASE) 50 MCG/ACT nasal spray Place 2 sprays into both nostrils daily. 16 g 3   hyoscyamine (LEVSIN SL) 0.125 MG SL tablet Place 1 tablet (0.125 mg total) under the tongue every 4 (four) hours as needed (Abdominal Pain, spasm, IBS). 60 tablet 4   Meth-Hyo-M Bl-Na Phos-Ph Sal (URIBEL) 118 MG CAPS Take 118 mg by mouth as needed (for bladder). Reported on 09/09/2015  2   metoprolol succinate (TOPROL-XL) 25 MG 24 hr tablet Take 1 tablet by mouth once daily 90 tablet 3   metoprolol tartrate (LOPRESSOR)  25 MG tablet Take 1 tablet (25 mg) as needed for palpitations 30 tablet 11  PARoxetine (PAXIL) 20 MG tablet Take 1 tablet by mouth once daily 90 tablet 0   rosuvastatin (CRESTOR) 10 MG tablet Take 1 tablet (10 mg total) by mouth daily. 90 tablet 3   ibuprofen (ADVIL,MOTRIN) 200 MG tablet Take 200 mg by mouth every 6 (six) hours as needed for headache or moderate pain. (Patient not taking: Reported on 05/18/2021)     No current facility-administered medications for this visit.    Social history is notable in that she is married and has 2 children and 3 grandchildren. 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 There is no tobacco or alcohol use.   Family History  Problem Relation Age of Onset   Skin cancer Mother    Diabetes Sister        Borderline   Hypertension Sister    Heart attack Sister    Breast cancer Sister 55       maternal half-sister; deceased 19   Diabetes Brother        Borderline; maternal half-brother   Stomach cancer Maternal Aunt    Throat cancer Maternal Aunt        stomach and throat; heavy smoker and drinker; deceased 58   Cancer Cousin        breast/ovarian; daughter of mat cousin with unk. primary at 80; pt's 1st cousin-once-removed   Other Father        accident   Anemia Daughter        IDA   Colon cancer Neg Hx    Esophageal cancer Neg Hx    Rectal cancer Neg Hx     ROS General: Negative; No fevers, chills, or night sweats;  HEENT: Negative; No changes in vision or hearing, sinus congestion, difficulty swallowing Pulmonary: Negative; No cough, wheezing, shortness of breath, hemoptysis Cardiovascular: See HPI Positive for occasional ankle swelling GI: Negative; No nausea, vomiting, diarrhea, or abdominal pain GU: Negative; No dysuria, hematuria, or difficulty voiding Musculoskeletal: Negative; no myalgias, joint pain, or weakness Hematologic/Oncology: As above, positive for invasive ductal carcinoma of the  right breast .  Status post chemotherapy and radiation treatment following lumpectomy Endocrine: Negative; no heat/cold intolerance; no diabetes Neuro: Negative; no changes in balance, headaches Skin: Negative; No rashes or skin lesions Psychiatric: Negative; No behavioral problems, depression Sleep: Nonrestorative sleep, snoring, frequent nocturia, daytime sleepiness, no bruxism, restless legs, hypnogognic hallucinations, no cataplexy Other comprehensive 14 point system review is negative.   PE BP (!) 164/94   Pulse (!) 52   Ht 5' 4"  (1.626 m)   Wt 155 lb 6.4 oz (70.5 kg)   LMP 07/06/1985 (Approximate)   SpO2 98%   BMI 26.67 kg/m    Repeat blood pressure by me was elevated at 166/90  Wt Readings from Last 3 Encounters:  05/18/21 155 lb 6.4 oz (70.5 kg)  12/26/20 161 lb (73 kg)  11/11/20 161 lb 2 oz (73.1 kg)    General: Alert, oriented, no distress.  Skin: normal turgor, no rashes, warm and dry HEENT: Normocephalic, atraumatic. Pupils equal round and reactive to light; sclera anicteric; extraocular muscles intact;  Nose without nasal septal hypertrophy Mouth/Parynx benign; Mallinpatti scale 3 Neck: No JVD, no carotid bruits; normal carotid upstroke Lungs: clear to ausculatation and percussion; no wheezing or rales Chest wall: without tenderness to palpitation Heart: PMI not displaced, RRR, s1 s2 normal, 1/6 systolic murmur, no diastolic murmur, no rubs, gallops, thrills, or heaves Abdomen: soft, nontender; no hepatosplenomehaly, BS+; abdominal aorta  nontender and not dilated by palpation. Back: no CVA tenderness Pulses 2+ Musculoskeletal: full range of motion, normal strength, no joint deformities Extremities: no clubbing cyanosis or edema, Homan's sign negative  Neurologic: grossly nonfocal; Cranial nerves grossly wnl Psychologic: Normal mood and affect   May 18, 2021 ECG (independently read by me): Sinus bradycardia at 52, 1st degree AV block  October 06, 2020 ECG  (independently read by me): Sinus bradycardia at 59, 1st degree AV block;  PR 250 msec,   November 2021 ECG (independently read by me):  Sinus bradycardia at 57 bpm with first-degree AV block, PR interval 262 ms.  No ectopy.  Nonspecific T changes.  August 2019 ECG (independently read by me): Normal sinus rhythm at 60 bpm.  First-degree AV block, PR interval 236 ms per;  Nonspecific T changes.  April 2019 ECG (independently read by me): Sinus bradycardia 55 bpm with first-degree AV block.  PR interval 258 ms.  June 2017 ECG (independently read by me): Sinus bradycardia 55 bpm.  First-degree AV block with a PR interval at 250 ms.  Nonspecific ST-T changes.  April 2016 ECG (independently read by me): Normal sinus rhythm at 63 bpm.  First-degree AV block with a PR interval at 254 ms.  QTc interval 49 ms.  Nonspecific T changes.  September 2015 ECG sinus rhythm with first-degree AV block.  Nonspecific T changes V1 through V3.  Prior ECG: Sinus rhythm at 57 beats per minute, with first degree AV block with PR interval 252 ms. QTc interval is normal at 389 ms. Nonspecific T changes.  LABS:  BMP Latest Ref Rng & Units 05/20/2021 12/05/2020 05/26/2020  Glucose 70 - 99 mg/dL 97 100(H) 98  BUN 8 - 27 mg/dL 9 8 13   Creatinine 0.57 - 1.00 mg/dL 1.01(H) 0.89 0.86  BUN/Creat Ratio 12 - 28 9(L) 9(L) -  Sodium 134 - 144 mmol/L 141 143 140  Potassium 3.5 - 5.2 mmol/L 4.3 4.1 3.9  Chloride 96 - 106 mmol/L 104 105 105  CO2 20 - 29 mmol/L 26 24 29   Calcium 8.7 - 10.3 mg/dL 9.6 9.7 9.6     Hepatic Function Latest Ref Rng & Units 05/20/2021 12/05/2020 05/09/2019  Total Protein 6.0 - 8.5 g/dL 7.2 7.1 7.5  Albumin 3.7 - 4.7 g/dL 4.6 4.6 4.6  AST 0 - 40 IU/L 22 21 24   ALT 0 - 32 IU/L 18 19 23   Alk Phosphatase 44 - 121 IU/L 114 112 114  Total Bilirubin 0.0 - 1.2 mg/dL 0.6 0.3 0.5     CBC Latest Ref Rng & Units 05/20/2021 10/12/2017 05/09/2017  WBC 3.4 - 10.8 x10E3/uL 4.0 3.4 4.2  Hemoglobin 11.1 - 15.9  g/dL 12.3 13.1 12.9  Hematocrit 34.0 - 46.6 % 36.3 37.9 37.0  Platelets 150 - 450 x10E3/uL 225 243 275   Lab Results  Component Value Date   MCV 92 05/20/2021   MCV 93 10/12/2017   MCV 90 05/09/2017    Lab Results  Component Value Date   TSH 2.080 10/12/2017   No results found for: HGBA1C   Lipid Panel     Component Value Date/Time   CHOL 146 05/20/2021 1036   CHOL 237 (H) 06/22/2013 1405   TRIG 71 05/20/2021 1036   TRIG 103 06/22/2013 1405   HDL 65 05/20/2021 1036   HDL 74 06/22/2013 1405   CHOLHDL 2.2 05/20/2021 1036   CHOLHDL 3.2 07/28/2016 1151   VLDL 22 07/28/2016 1151   LDLCALC 67 05/20/2021 1036  LDLCALC 142 (H) 06/22/2013 1405     BNP No results found for: PROBNP  Lipid Panel     Component Value Date/Time   CHOL 146 05/20/2021 1036   CHOL 237 (H) 06/22/2013 1405   TRIG 71 05/20/2021 1036   TRIG 103 06/22/2013 1405   HDL 65 05/20/2021 1036   HDL 74 06/22/2013 1405   CHOLHDL 2.2 05/20/2021 1036   CHOLHDL 3.2 07/28/2016 1151   VLDL 22 07/28/2016 1151   LDLCALC 67 05/20/2021 1036   LDLCALC 142 (H) 06/22/2013 1405     RADIOLOGY: No results found.  IMPRESSION:  1. Essential hypertension   2. Chest tightness   3. PAF (paroxysmal atrial fibrillation) ([place])   4. Pure hypercholesterolemia   5. First degree heart block   6. Gastroesophageal reflux disease without esophagitis   7. History of breast cancer     ASSESSMENT AND PLAN: Ms. Myung is a 70 year-old African-American female who has a history of PSVT/PAF which has been  controlled with low-dose flecainide 50 twice a day combined with beta blocker therapy.  When I saw her in 2019 she was maintaining sinus rhythm and had stable first-degree AV block.  She continues to be on flecainide 50 mg twice a day, metoprolol succinate 25 mg daily and has a prescription for metoprolol tartrate to take as needed.  When I saw her in April 2022 apparently she had been taken off rosuvastatin and was on Livalo.   She had undergone subsequent laboratory by her primary physician Dr. Theda Sers and LDL cholesterol was 117.  She is now back on rosuvastatin at 10 mg.  She is maintaining sinus rhythm on flecainide 50 mg twice a day and metoprolol succinate and has first-degree AV block.  She has experienced mild exertional chest pressure over the past several months.  On exam she has mild tenderness over the left costochondral region which is musculoskeletal.  Her blood pressure today is elevated.  I have recommended the addition of amlodipine 5 mg daily which will be helpful both for blood pressure control, CAD, as well as potential esophageal spasm.  I have recommended she undergo coronary CTA for further evaluation of her exertional chest pain component.  I am recommending repeat fasting laboratory with a comprehensive metabolic panel CBC and fasting lipid studies.  She is now on Nexium 40 mg for her GERD followed closely by Dr. Scarlette Shorts.  I will see her in follow-up of her coronary CTA for follow-up evaluation.   Troy Sine, MD, [facility] 05/24/2021 12:57 PM

## 2021-05-20 DIAGNOSIS — R0789 Other chest pain: Secondary | ICD-10-CM | POA: Diagnosis not present

## 2021-05-20 DIAGNOSIS — I480 Paroxysmal atrial fibrillation: Secondary | ICD-10-CM | POA: Diagnosis not present

## 2021-05-20 DIAGNOSIS — E785 Hyperlipidemia, unspecified: Secondary | ICD-10-CM | POA: Diagnosis not present

## 2021-05-20 LAB — CBC
Hematocrit: 36.3 % (ref 34.0–46.6)
Hemoglobin: 12.3 g/dL (ref 11.1–15.9)
MCH: 31.3 pg (ref 26.6–33.0)
MCHC: 33.9 g/dL (ref 31.5–35.7)
MCV: 92 fL (ref 79–97)
Platelets: 225 10*3/uL (ref 150–450)
RBC: 3.93 x10E6/uL (ref 3.77–5.28)
RDW: 12.7 % (ref 11.7–15.4)
WBC: 4 10*3/uL (ref 3.4–10.8)

## 2021-05-20 LAB — COMPREHENSIVE METABOLIC PANEL
ALT: 18 IU/L (ref 0–32)
AST: 22 IU/L (ref 0–40)
Albumin/Globulin Ratio: 1.8 (ref 1.2–2.2)
Albumin: 4.6 g/dL (ref 3.7–4.7)
Alkaline Phosphatase: 114 IU/L (ref 44–121)
BUN/Creatinine Ratio: 9 — ABNORMAL LOW (ref 12–28)
BUN: 9 mg/dL (ref 8–27)
Bilirubin Total: 0.6 mg/dL (ref 0.0–1.2)
CO2: 26 mmol/L (ref 20–29)
Calcium: 9.6 mg/dL (ref 8.7–10.3)
Chloride: 104 mmol/L (ref 96–106)
Creatinine, Ser: 1.01 mg/dL — ABNORMAL HIGH (ref 0.57–1.00)
Globulin, Total: 2.6 g/dL (ref 1.5–4.5)
Glucose: 97 mg/dL (ref 70–99)
Potassium: 4.3 mmol/L (ref 3.5–5.2)
Sodium: 141 mmol/L (ref 134–144)
Total Protein: 7.2 g/dL (ref 6.0–8.5)
eGFR: 60 mL/min/{1.73_m2} (ref >59)

## 2021-05-20 LAB — LIPID PANEL
Chol/HDL Ratio: 2.2 ratio (ref 0.0–4.4)
Cholesterol, Total: 146 mg/dL (ref 100–199)
HDL: 65 mg/dL (ref >39)
LDL Chol Calc (NIH): 67 mg/dL (ref 0–99)
Triglycerides: 71 mg/dL (ref 0–149)
VLDL Cholesterol Cal: 14 mg/dL (ref 5–40)

## 2021-05-24 ENCOUNTER — Encounter: Payer: Self-pay | Admitting: Cardiovascular Disease

## 2021-06-05 ENCOUNTER — Telehealth (HOSPITAL_COMMUNITY): Payer: Self-pay | Admitting: *Deleted

## 2021-06-05 NOTE — Telephone Encounter (Signed)
Attempted to call patient regarding upcoming cardiac CT appointment. °Left message on voicemail with name and callback number ° °Aras Albarran RN Navigator Cardiac Imaging ° Heart and Vascular Services °[phone] Office °[phone] Cell ° °

## 2021-06-08 ENCOUNTER — Other Ambulatory Visit: Payer: Self-pay

## 2021-06-08 ENCOUNTER — Encounter (HOSPITAL_COMMUNITY): Payer: Self-pay

## 2021-06-08 ENCOUNTER — Ambulatory Visit (HOSPITAL_COMMUNITY)
Admission: RE | Admit: 2021-06-08 | Discharge: 2021-06-08 | Disposition: A | Discharge status: Home / Self Care | Payer: Medicare PPO | Source: Ambulatory Visit | Attending: Cardiovascular Disease | Admitting: Cardiovascular Disease

## 2021-06-08 ENCOUNTER — Other Ambulatory Visit (HOSPITAL_COMMUNITY): Payer: Self-pay | Admitting: Cardiovascular Disease

## 2021-06-08 DIAGNOSIS — Z789 Other specified health status: Secondary | ICD-10-CM

## 2021-06-08 DIAGNOSIS — I480 Paroxysmal atrial fibrillation: Secondary | ICD-10-CM | POA: Diagnosis not present

## 2021-06-08 DIAGNOSIS — R0789 Other chest pain: Secondary | ICD-10-CM | POA: Insufficient documentation

## 2021-06-08 MED ORDER — IOHEXOL 350 MG/ML SOLN
95.0000 mL | Freq: Once | INTRAVENOUS | Status: AC | PRN
  Administered 2021-06-08: 95 mL via INTRAVENOUS

## 2021-06-08 MED ORDER — NITROGLYCERIN 0.4 MG SL SUBL
0.8000 mg | SUBLINGUAL_TABLET | Freq: Once | SUBLINGUAL | Status: AC
  Administered 2021-06-08: 0.8 mg via SUBLINGUAL

## 2021-06-08 MED ORDER — NITROGLYCERIN 0.4 MG SL SUBL
SUBLINGUAL_TABLET | SUBLINGUAL | Status: AC
  Filled 2021-06-08: qty 2

## 2021-06-18 ENCOUNTER — Ambulatory Visit: Payer: Medicare PPO

## 2021-07-03 ENCOUNTER — Ambulatory Visit: Payer: Medicare PPO

## 2021-07-20 ENCOUNTER — Other Ambulatory Visit: Payer: Self-pay | Admitting: Medical

## 2021-07-24 ENCOUNTER — Ambulatory Visit
Admission: RE | Admit: 2021-07-24 | Discharge: 2021-07-24 | Disposition: A | Discharge status: Home / Self Care | Payer: Medicare PPO | Source: Ambulatory Visit | Attending: Hematology and Oncology | Admitting: Hematology and Oncology

## 2021-07-24 DIAGNOSIS — Z1231 Encounter for screening mammogram for malignant neoplasm of breast: Secondary | ICD-10-CM

## 2021-07-30 ENCOUNTER — Ambulatory Visit (INDEPENDENT_AMBULATORY_CARE_PROVIDER_SITE_OTHER): Payer: Medicare PPO | Admitting: Cardiovascular Disease

## 2021-07-30 ENCOUNTER — Encounter: Payer: Self-pay | Admitting: Cardiovascular Disease

## 2021-07-30 ENCOUNTER — Other Ambulatory Visit: Payer: Self-pay

## 2021-07-30 DIAGNOSIS — G4719 Other hypersomnia: Secondary | ICD-10-CM | POA: Diagnosis not present

## 2021-07-30 DIAGNOSIS — I471 Supraventricular tachycardia: Secondary | ICD-10-CM | POA: Diagnosis not present

## 2021-07-30 DIAGNOSIS — I480 Paroxysmal atrial fibrillation: Secondary | ICD-10-CM

## 2021-07-30 DIAGNOSIS — I440 Atrioventricular block, first degree: Secondary | ICD-10-CM

## 2021-07-30 DIAGNOSIS — K219 Gastro-esophageal reflux disease without esophagitis: Secondary | ICD-10-CM

## 2021-07-30 DIAGNOSIS — I10 Essential (primary) hypertension: Secondary | ICD-10-CM | POA: Diagnosis not present

## 2021-07-30 DIAGNOSIS — G478 Other sleep disorders: Secondary | ICD-10-CM | POA: Diagnosis not present

## 2021-07-30 NOTE — Progress Notes (Addendum)
Patient ID: Tami [name], female   DOB: [birth year], 71 y.o.   MRN: [id number]    HPI: Tami [name] is a 71 year old African American female who is a former patient of Dr. Rollene Fare. She presents for a 2 month cardiology evaluation.  Tami [name] has a history of palpitations, paroxysmal supraventricular tachycardia as well as paroxysmal atrial fibrillation which have been detected on remote monitoring. She has been treated with low-dose flecainide 50 mg twice a day in addition to metoprolol succinate 50 mg daily. She also has a history of hyperlipidemia in the past has had LDLs in the 145 range but is intolerant to several statins including Crestor. She also states she developed some myalgias secondary to Zetia. When she was last seen by Dr. Rollene Fare he had written her a prescription for livalo 1 mg but she never took this.  She also has continued problems with low back and hip discomfort and has some sciatica issues. She is followed by Dr. Mayer Camel. She also has undergone MRIs of her back.  An echo Doppler study in February 2012 showed normal systolic and diastolic function. She did not have significant valvular pathology. A nuclear perfusion study in 2010 which was normal.  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  I saw her prior to undergoing her lumpectomy and prior to radiation and chemotherapy.  A subsequent 2-D echo Doppler study on 03/28/2014 showed an ejection fraction at 60-65% and was normal.  She tolerated lumpectomy well and completed chemotherapy and radiation and  is on letrozole for planned 7 years.   I had not seen her since 2017 and last saw her in April 2019.  He done well from a cardiac standpoint and denied any chest pain, palpitations, or palpitations.  I recently saw her,  she was experiencing poor sleep, admitted to snoring and had frequent nocturia.  She was going to bed at 9:30 PM and waking up at 7:30 AM.  She continued to be on flecainide 50 mg twice a day in addition to Toprol-XL 50 mg without recurrent arrhythmia.  Is a history of anxiety and has been on a trip to [place] in addition to fluoxetine.  Because of her poor sleep, I referred her for a sleep study.  On Nov 06, 2017 she underwent a diagnostic polysomnogram.  This was notable in that she did not have any significant obstructive sleep apnea overall.  Her AHI was 0.3/h.  However, she had increased arousals and her RDI was increased to 12.6.  She was unable to achieve any REM sleep and it was possible that potential of the severity of sleep disordered breathing may have been underestimated due to the absence of REM sleep on this study.  She did not have any significant oxygen desaturation with a nadir of 92%.  There was markedly reduced sleep efficiency at 50.9%.  There was soft snoring. She did not have clinically significant periodic limb movements during sleep.  I have not seen her since her last evaluation with me in August 2019 at which time she was maintaining sinus rhythm with flecainide for her PAF/SVT and her blood pressure and lipids were controlled.  She was evaluated by Roby Lofts, PA-C in November 2020 and remained stable from a cardiac standpoint.  She did experience an isolated episode of palpitations 1 month  previously which occurred after she had forgotten to take her medication.  I saw her in November 2021 at which time she continued to feel well.  She was experiencing occasional short-lived palpitations which seem to be precipitated by stress.  She was only  taking metoprolol tartrate 25 mg once a day.  She is followed by Dr. Shelia Media.  She is on low-dose rosuvastatin at 5 mg.  She continued to be on flecainide 50 mg twice a day.  She was unaware of breakthrough AF.  She had undergone recent  laboratory at [facility].  Hemoglobin 12.9, hematocrit was 38.0.  Creatinine was normal at 0.9.  LFTs were normal.  Ferritin was 82.  TSH 1.07.  B12 1514.  Vitamin D 96.  She has continued to be followed by Dr. Lindi Adie for her remote breast cancer which has been stable.    I saw her in April 2022 and since her prior evaluation she had sstopped taking rosuvastatin and was now been on Livalo 2 mg for the past 6 weeks.  She was unaware of any recurrent atrial fibrillation.  She has not had recent laboratory since Livalo initiation.  She denies any chest pain, PND orthopnea.   I recommended that she undergo follow-up laboratory.  She was to follow-up with Dr. Payton Mccallum for her remote breast cancer and Dr. Henrene Pastor from a GI standpoint.  At Dr. Blanch Media most recent evaluation Nexium was increased to 40 mg daily and she was advised on reflux precautions.  She was scheduled to undergo modified barium swallow to rule out oropharyngeal dysphagia with aspiration and he felt she ultimately would need upper endoscopy with probable esophageal dilatation.  When I last saw her in May 18, 2021 she felt well.  At that time she was experiencing some left chest wall tenderness and also complaining of some mild exertional chest pressure over the previous several months.  On exam she had mild tenderness over the left costochondral region suggesting musculoskeletal etiology.  Her blood pressure was elevated and I recommended the addition of amlodipine 5 mg which would be helpful both for blood pressure control, CAD as well as potential esophageal spasm.  I recommended that she undergo coronary CTA for further evaluation of her exertional chest pain component and recommended follow-up laboratory.  She was on Nexium for GERD and is closely followed by Dr. Henrene Pastor.  She continues to be on flecainide 50 mg twice a day without recurrent AF and metoprolol succinate 25 mg daily.  She underwent coronary CTA on June 08, 2021.  Calcium score was 0 and there was no evidence for coronary atherosclerotic plaque.  She did not have any incidental noncardiac findings over read of her CT of her chest.  Presently, she denies any recurrent chest wall discomfort.  She admits to being tired.  Her blood pressure at home has been stable around [id number] systolically over 72.  She was going to bed around 9 falling asleep around 11, experiencing nocturia at least 3 times per night and waking up at 10 AM.  Her sleep was nonrestorative.  She presents for reevaluation.  Past Medical History:  Diagnosis Date   Allergy    Anemia    as a child   Anxiety    Arthritis    Asthma    Atrial fibrillation ([place])    Breast cancer ([place]) 2015   Cataract    Depression with anxiety    Dysrhythmia    hx AF   Family history of  malignant neoplasm of breast    Fibromyalgia    GERD (gastroesophageal reflux disease)    Heart palpitations    Heartburn    Hyperlipidemia    Invasive ductal carcinoma of right breast ([place]) 02/08/2014   Lower Inner Quadrant   Irregular heartbeat    Malignant neoplasm of breast (female), unspecified site    Migraine    Numbness of arm    Right Arm   Osteopenia    Osteoporosis    Personal history of chemotherapy 2015   Right Breast Cancer   Personal history of radiation therapy 2015   Right Breast Cancer   Prediabetes    Pure hypercholesterolemia    SVT (supraventricular tachycardia) ([place])     Past Surgical History:  Procedure Laterality Date   30 Day Monitor  04/10/2009 to 05/23/2009   No End Of Summary Report Atrial Fib,Narrow Complex Tachycardia,Sinus Rhythm    ABDOMINAL HYSTERECTOMY  1987   BREAST EXCISIONAL BIOPSY Left    BREAST LUMPECTOMY Right 2015   BREAST LUMPECTOMY WITH NEEDLE LOCALIZATION AND AXILLARY SENTINEL LYMPH NODE BX Right 04/02/2014   Procedure: Right Axillary Lymphatic Mapping; Right axillary Sentinal Node Biopsy; Right Partial Mastectomy after wire localization;  Surgeon: Jackolyn Confer, MD;  Location: [facility];  Service: General;  Laterality: Right;   BREAST SURGERY     Left   CYSTOSCOPY  04/25/2012   Pt may be a candidate for research anti-incontinence procedure protocol   Dipyridamole Myoview  05/27/2009   The post stress myocardial perfusion images show a normal pattern of perfusion in all regions.The post stress ejection fraction is 83 %. Normal  myocardial perfusion imaging This is a low risk scan.   DOPPLER ECHOCARDIOGRAPHY  08/21/2010   Ejecion Fraction =>55% All chambers are normal in size and function No significant change from 2010.   lower Venous Dopplers  04/11/2009   Normal bilateral lower ext venous duplex   PORTACATH PLACEMENT N/A 04/26/2014   Procedure: ULTRASOUND GUIDED PORT A CATHE INSERTION;  Surgeon: Jackolyn Confer, MD;  Location: [facility];  Service: General;  Laterality: N/A;   Upper Arterial Dopplers  06/02/2011   Right Side PPG Demonstrates siginificantly diminshed waveforms with the Adson's maneuver which may suggest Thoracic Outlet Syndrone. This is a abnormal thoracic outlet arterial evaluation.    Allergies  Allergen Reactions   Latex Swelling and Rash    Skin swelling    Sulfonamide Derivatives Anaphylaxis, Shortness Of Breath, Nausea Only and Other (See Comments)    Sweating, headache    Clarithromycin Nausea Only and Other (See Comments)    Headache    Crestor [Rosuvastatin] Other (See Comments)    Muscle aches, pain, weakness in lower extremities   Statins Other (See Comments)    Muscle soreness and weakness w/ Rosuvastatin and Zetia.   Ciprofloxacin Hcl Nausea Only, Palpitations and Other (See Comments)    Headache     Current Outpatient Medications  Medication Sig Dispense Refill   albuterol (VENTOLIN HFA) 108 (90 Base) MCG/ACT inhaler Inhale 2 puffs into the lungs every 6 (six) hours as needed for wheezing or shortness of breath. 8 g 2   amLODipine (NORVASC) 5 MG tablet Take 1 tablet (5 mg total) by mouth daily. 90 tablet 3    aspirin EC 81 MG tablet Take 81 mg by mouth daily.     Calcium Carbonate-Vitamin D (CALTRATE 600+D PO) Take by mouth.     cetirizine (ZYRTEC) 10 MG tablet Take 10 mg by mouth daily.  Cholecalciferol (VITAMIN D3) 25 MCG (1000 UT) CAPS Take 1 capsule by mouth daily.     esomeprazole (NEXIUM) 40 MG capsule Take 1 capsule (40 mg total) by mouth daily at 12 noon. 90 capsule 3   flecainide (TAMBOCOR) 100 MG tablet Take 1/2 (one-half) tablet by mouth twice daily 90 tablet 0   fluticasone (FLONASE) 50 MCG/ACT nasal spray Place 2 sprays into both nostrils daily. 16 g 3   hyoscyamine (LEVSIN SL) 0.125 MG SL tablet Place 1 tablet (0.125 mg total) under the tongue every 4 (four) hours as needed (Abdominal Pain, spasm, IBS). 60 tablet 4   ibuprofen (ADVIL,MOTRIN) 200 MG tablet Take 200 mg by mouth every 6 (six) hours as needed for headache or moderate pain.     Meth-Hyo-M Bl-Na Phos-Ph Sal (URIBEL) 118 MG CAPS Take 118 mg by mouth as needed (for bladder). Reported on 09/09/2015  2   metoprolol succinate (TOPROL-XL) 25 MG 24 hr tablet Take 1 tablet by mouth once daily 90 tablet 3   metoprolol tartrate (LOPRESSOR) 25 MG tablet Take 1 tablet (25 mg) as needed for palpitations 30 tablet 11   PARoxetine (PAXIL) 20 MG tablet Take 1 tablet by mouth once daily 90 tablet 0   rosuvastatin (CRESTOR) 10 MG tablet Take 1 tablet (10 mg total) by mouth daily. 90 tablet 3   No current facility-administered medications for this visit.    Social history is notable in that she is married and has 2 children and 3 grandchildren. 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 There is no tobacco or alcohol use.   Family History  Problem Relation Age of Onset   Skin cancer Mother    Diabetes Sister        Borderline   Hypertension Sister    Heart attack Sister    Breast cancer Sister 12       maternal half-sister; deceased 62   Diabetes Brother        Borderline; maternal  half-brother   Stomach cancer Maternal Aunt    Throat cancer Maternal Aunt        stomach and throat; heavy smoker and drinker; deceased 40   Cancer Cousin        breast/ovarian; daughter of mat cousin with unk. primary at 69; pt's 1st cousin-once-removed   Other Father        accident   Anemia Daughter        IDA   Colon cancer Neg Hx    Esophageal cancer Neg Hx    Rectal cancer Neg Hx     ROS General: Negative; No fevers, chills, or night sweats;  HEENT: Negative; No changes in vision or hearing, sinus congestion, difficulty swallowing Pulmonary: Negative; No cough, wheezing, shortness of breath, hemoptysis Cardiovascular: See HPI Positive for occasional ankle swelling GI: Negative; No nausea, vomiting, diarrhea, or abdominal pain GU: Negative; No dysuria, hematuria, or difficulty voiding Musculoskeletal: Negative; no myalgias, joint pain, or weakness Hematologic/Oncology: As above, positive for invasive ductal carcinoma of the right breast .  Status post chemotherapy and radiation treatment following lumpectomy Endocrine: Negative; no heat/cold intolerance; no diabetes Neuro: Negative; no changes in balance, headaches Skin: Negative; No rashes or skin lesions Psychiatric: Negative; No behavioral problems, depression Sleep: Nonrestorative sleep, snoring, frequent nocturia, daytime sleepiness, no bruxism, restless legs, hypnogognic hallucinations, no cataplexy  An Epworth Sleepiness Scale score was calculated in the office today 07/30/21 and this endorsed at 8.  Other comprehensive  14 point system review is negative.   PE BP (!) 162/82    Pulse (!) 59    Ht 5' 4"  (1.626 m)    Wt 152 lb 3.2 oz (69 kg)    LMP 07/06/1985 (Approximate)    SpO2 99%    BMI 26.13 kg/m    Repeat blood pressure by me was improved at 132/80  Wt Readings from Last 3 Encounters:  07/30/21 152 lb 3.2 oz (69 kg)  05/18/21 155 lb 6.4 oz (70.5 kg)  12/26/20 161 lb (73 kg)   General: Alert, oriented, no  distress.  Skin: normal turgor, no rashes, warm and dry HEENT: Normocephalic, atraumatic. Pupils equal round and reactive to light; sclera anicteric; extraocular muscles intact;  Nose without nasal septal hypertrophy Mouth/Parynx benign; Mallinpatti scale 3 Neck: No JVD, no carotid bruits; normal carotid upstroke Lungs: clear to ausculatation and percussion; no wheezing or rales Chest wall: without tenderness to palpitation Heart: PMI not displaced, RRR, s1 s2 normal, 1/6 systolic murmur, no diastolic murmur, no rubs, gallops, thrills, or heaves Abdomen: soft, nontender; no hepatosplenomehaly, BS+; abdominal aorta nontender and not dilated by palpation. Back: no CVA tenderness Pulses 2+ Musculoskeletal: full range of motion, normal strength, no joint deformities Extremities: no clubbing cyanosis or edema, Homan's sign negative  Neurologic: grossly nonfocal; Cranial nerves grossly wnl Psychologic: Normal mood and affect   July 30, 2021 ECG (independently read by me): Sinus bradycardia at 55, 1st degree AV block PR 250 msec, [facility]  May 18, 2021 ECG (independently read by me): Sinus bradycardia at 52, 1st degree AV block  October 06, 2020 ECG (independently read by me): Sinus bradycardia at 59, 1st degree AV block;  PR 250 msec,   November 2021 ECG (independently read by me):  Sinus bradycardia at 57 bpm with first-degree AV block, PR interval 262 ms.  No ectopy.  Nonspecific T changes.  August 2019 ECG (independently read by me): Normal sinus rhythm at 60 bpm.  First-degree AV block, PR interval 236 ms per;  Nonspecific T changes.  April 2019 ECG (independently read by me): Sinus bradycardia 55 bpm with first-degree AV block.  PR interval 258 ms.  June 2017 ECG (independently read by me): Sinus bradycardia 55 bpm.  First-degree AV block with a PR interval at 250 ms.  Nonspecific ST-T changes.  April 2016 ECG (independently read by me): Normal sinus rhythm at 63 bpm.  First-degree AV  block with a PR interval at 254 ms.  QTc interval 49 ms.  Nonspecific T changes.  September 2015 ECG sinus rhythm with first-degree AV block.  Nonspecific T changes V1 through V3.  Prior ECG: Sinus rhythm at 57 beats per minute, with first degree AV block with PR interval 252 ms. QTc interval is normal at 389 ms. Nonspecific T changes.  LABS:  BMP Latest Ref Rng & Units 05/20/2021 12/05/2020 05/26/2020  Glucose 70 - 99 mg/dL 97 100(H) 98  BUN 8 - 27 mg/dL 9 8 13   Creatinine 0.57 - 1.00 mg/dL 1.01(H) 0.89 0.86  BUN/Creat Ratio 12 - 28 9(L) 9(L) -  Sodium 134 - 144 mmol/L 141 143 140  Potassium 3.5 - 5.2 mmol/L 4.3 4.1 3.9  Chloride 96 - 106 mmol/L 104 105 105  CO2 20 - 29 mmol/L 26 24 29   Calcium 8.7 - 10.3 mg/dL 9.6 9.7 9.6     Hepatic Function Latest Ref Rng & Units 05/20/2021 12/05/2020 05/09/2019  Total Protein 6.0 - 8.5 g/dL 7.2 7.1 7.5  Albumin  3.7 - 4.7 g/dL 4.6 4.6 4.6  AST 0 - 40 IU/L 22 21 24   ALT 0 - 32 IU/L 18 19 23   Alk Phosphatase 44 - 121 IU/L 114 112 114  Total Bilirubin 0.0 - 1.2 mg/dL 0.6 0.3 0.5     CBC Latest Ref Rng & Units 05/20/2021 10/12/2017 05/09/2017  WBC 3.4 - 10.8 x10E3/uL 4.0 3.4 4.2  Hemoglobin 11.1 - 15.9 g/dL 12.3 13.1 12.9  Hematocrit 34.0 - 46.6 % 36.3 37.9 37.0  Platelets 150 - 450 x10E3/uL 225 243 275   Lab Results  Component Value Date   MCV 92 05/20/2021   MCV 93 10/12/2017   MCV 90 05/09/2017    Lab Results  Component Value Date   TSH 2.080 10/12/2017   No results found for: HGBA1C   Lipid Panel     Component Value Date/Time   CHOL 146 05/20/2021 1036   CHOL 237 (H) 06/22/2013 1405   TRIG 71 05/20/2021 1036   TRIG 103 06/22/2013 1405   HDL 65 05/20/2021 1036   HDL 74 06/22/2013 1405   CHOLHDL 2.2 05/20/2021 1036   CHOLHDL 3.2 07/28/2016 1151   VLDL 22 07/28/2016 1151   LDLCALC 67 05/20/2021 1036   LDLCALC 142 (H) 06/22/2013 1405     BNP No results found for: PROBNP  Lipid Panel     Component Value Date/Time    CHOL 146 05/20/2021 1036   CHOL 237 (H) 06/22/2013 1405   TRIG 71 05/20/2021 1036   TRIG 103 06/22/2013 1405   HDL 65 05/20/2021 1036   HDL 74 06/22/2013 1405   CHOLHDL 2.2 05/20/2021 1036   CHOLHDL 3.2 07/28/2016 1151   VLDL 22 07/28/2016 1151   LDLCALC 67 05/20/2021 1036   LDLCALC 142 (H) 06/22/2013 1405     RADIOLOGY: No results found.  IMPRESSION:  1. PAF (paroxysmal atrial fibrillation) ([place])   2. PSVT   3. Excessive daytime sleepiness   4. Poor sleep pattern   5. Essential hypertension   6. First degree heart block   7. Gastroesophageal reflux disease without esophagitis     ASSESSMENT AND PLAN: Tami [name] is a 71 year-old African-American female who has a history of PSVT/PAF which has been  controlled with low-dose flecainide 50 twice a day in addition to metoprolol succinate 25 mg daily.  When I saw her in 2019 she was maintaining sinus rhythm and had stable first-degree AV block.  Presently, she has been without recurrent arrhythmia we will continues to be on flecainide and metoprolol as above.  Her ECG today shows sinus bradycardia at 55 with first-degree AV block which is stable.  She continues to be on rosuvastatin for hyperlipidemia.  Most recent lipid studies were reviewed from May 20, 2021 and LDL cholesterol is now excellent at 67 improved from 117.  When she was last evaluated, she had chest wall tenderness but also admitted to some mild exertional chest pressure.  Her coronary CTA was reviewed which was excellent and showed a calcium score of 0 without atherosclerosis.  Chest CT over read was unremarkable.  Her blood pressure was elevated upon presentation today but improved to 132/80 when checked by me on her amlodipine 5 mg in addition to metoprolol succinate dosing.  She continues to be on Nexium 40 mg daily for her GERD and is followed by Dr. Henrene Pastor.  She has significant complaints of being tired, nonrestorative sleep with frequent awakenings and nocturia  several times per night.  I have recommended  she undergo a home sleep study for assessment of possible underlying sleep apnea contributing to her symptomatology.  I will contact her regarding these results we will see her accordingly depending upon outcome.  Otherwise I will see her in 1 year for reevaluation or sooner as needed.     Troy Sine, MD, [facility] 08/06/2021 3:26 PM

## 2021-07-30 NOTE — Patient Instructions (Signed)
Medication Instructions:  Your physician recommends that you continue on your current medications as directed. Please refer to the Current Medication list given to you today.  *If you need a refill on your cardiac medications before your next appointment, please call your pharmacy*  Testing/Procedures: Home Sleep Test -- Mariann Laster CMA will contact you about setting this up   Follow-Up: At [facility], you and your health needs are our priority.  As part of our continuing mission to provide you with exceptional heart care, we have created designated Provider Care Teams.  These Care Teams include your primary Cardiologist (physician) and Advanced Practice Providers (APPs -  Physician Assistants and Nurse Practitioners) who all work together to provide you with the care you need, when you need it.  We recommend signing up for the patient portal called "MyChart".  Sign up information is provided on this After Visit Summary.  MyChart is used to connect with patients for Virtual Visits (Telemedicine).  Patients are able to view lab/test results, encounter notes, upcoming appointments, etc.  Non-urgent messages can be sent to your provider as well.   To learn more about what you can do with MyChart, go to [email or web address].    Your next appointment:   1 year with Dr. Claiborne Billings  Appointment may be sooner, depending on results of sleep test

## 2021-08-02 ENCOUNTER — Other Ambulatory Visit: Payer: Self-pay | Admitting: Hematology and Oncology

## 2021-08-02 DIAGNOSIS — F419 Anxiety disorder, unspecified: Secondary | ICD-10-CM

## 2021-08-03 ENCOUNTER — Telehealth: Payer: Self-pay | Admitting: *Deleted

## 2021-08-03 NOTE — Telephone Encounter (Signed)
Patient notified of HST appointment details. °

## 2021-08-06 ENCOUNTER — Encounter: Payer: Self-pay | Admitting: Cardiovascular Disease

## 2021-08-12 ENCOUNTER — Ambulatory Visit: Payer: Medicare PPO | Admitting: Hematology and Oncology

## 2021-08-19 NOTE — Progress Notes (Signed)
Patient Care Team: Tami Morning, DO as PCP - General (Family Medicine) Troy Sine, MD as PCP - Cardiology (Cardiology) Terance Ice, MD (Inactive) (Cardiology) Carolan Clines, MD (Inactive) as Attending Physician (Urology) Azucena Fallen, MD as Consulting Physician (Obstetrics and Gynecology) Nicholas Lose, MD as Consulting Physician (Hematology and Oncology)  DIAGNOSIS:    ICD-10-CM   1. Malignant neoplasm of lower-inner quadrant of right breast of female, estrogen receptor positive ([place])  C50.311    Z17.0       SUMMARY OF ONCOLOGIC HISTORY: Oncology History  Breast cancer of lower-inner quadrant of right female breast ([place])  01/18/2014 Mammogram   U/S Breast: 7 x 7 x 9 mm irregular taller than wide hypoechoic mass with a peripheral echogenic rim in the right breast 4 o'clock position 7 cm from the nipple. No right axillary lymphadenopathy.    02/14/2014 Initial Diagnosis   Breast cancer-right Er 100%, PR 94%, Ki 67: 17%; Her2 Neg ratio 1.74; T1bN0Mo (Clinical Stage 1A)   04/02/2014 Surgery   Right breast lumpectomy: Invasive ductal carcinoma negative for lymph vascular invasion, 1.6 cm, grade 2, 3 SLN negative, reexcision margins benign, ER 100%, PR 94%, HER-2 negative Ki-67 70% T1 C. N0 M0 stage IA: Oncotype DX 29 (19%ROR)   05/07/2014 - 07/09/2014 Chemotherapy   Taxotere Cytoxan adjuvant chemotherapy every 3 weeks x4 cycles (after cycle one hospitalization for pain related to Neulasta and severe gastritis due to steroids)   08/07/2014 - 09/16/2014 Radiation Therapy   Adjuvant radiation therapy   10/04/2014 -  Anti-estrogen oral therapy   Anastrozole 1 mg daily changed to letrozole 09/16/2015 due to hot flashes switched to exemestane 07/02/18 due to myalgias switched back to letrozole 08/02/18 due to severe joint stiffness     CHIEF COMPLIANT: Follow-up of right breast cancer on letrozole therapy  INTERVAL HISTORY: Tami [name] is a 71 y.o. with above-mentioned  history of breast cancer treated with lumpectomy, adjuvant chemotherapy, radiation, and who is currently on antiestrogen therapy with letrozole. Mammogram on 07/24/2021 showed no evidence of malignancy bilaterally. She presents to the clinic today for follow-up.  Complaining of left chest wall tenderness.  She had a CT scan of the chest especially for coronaries which showed a calcium score of 0.  She is complaining of sharp pains in the left chest wall.  ALLERGIES:  is allergic to latex, sulfonamide derivatives, clarithromycin, crestor [rosuvastatin], statins, and ciprofloxacin hcl.  MEDICATIONS:  Current Outpatient Medications  Medication Sig Dispense Refill   albuterol (VENTOLIN HFA) 108 (90 Base) MCG/ACT inhaler Inhale 2 puffs into the lungs every 6 (six) hours as needed for wheezing or shortness of breath. 8 g 2   amLODipine (NORVASC) 5 MG tablet Take 1 tablet (5 mg total) by mouth daily. 90 tablet 3   aspirin EC 81 MG tablet Take 81 mg by mouth daily.     Calcium Carbonate-Vitamin D (CALTRATE 600+D PO) Take by mouth.     cetirizine (ZYRTEC) 10 MG tablet Take 10 mg by mouth daily.     Cholecalciferol (VITAMIN D3) 25 MCG (1000 UT) CAPS Take 1 capsule by mouth daily.     esomeprazole (NEXIUM) 40 MG capsule Take 1 capsule (40 mg total) by mouth daily at 12 noon. 90 capsule 3   flecainide (TAMBOCOR) 100 MG tablet Take 1/2 (one-half) tablet by mouth twice daily 90 tablet 0   fluticasone (FLONASE) 50 MCG/ACT nasal spray Place 2 sprays into both nostrils daily. 16 g 3   hyoscyamine (LEVSIN SL) 0.125  MG SL tablet Place 1 tablet (0.125 mg total) under the tongue every 4 (four) hours as needed (Abdominal Pain, spasm, IBS). 60 tablet 4   ibuprofen (ADVIL,MOTRIN) 200 MG tablet Take 200 mg by mouth every 6 (six) hours as needed for headache or moderate pain.     Meth-Hyo-M Bl-Na Phos-Ph Sal (URIBEL) 118 MG CAPS Take 118 mg by mouth as needed (for bladder). Reported on 09/09/2015  2   metoprolol succinate  (TOPROL-XL) 25 MG 24 hr tablet Take 1 tablet by mouth once daily 90 tablet 3   metoprolol tartrate (LOPRESSOR) 25 MG tablet Take 1 tablet (25 mg) as needed for palpitations 30 tablet 11   PARoxetine (PAXIL) 20 MG tablet Take 1 tablet by mouth once daily 90 tablet 0   rosuvastatin (CRESTOR) 10 MG tablet Take 1 tablet (10 mg total) by mouth daily. 90 tablet 3   No current facility-administered medications for this visit.    PHYSICAL EXAMINATION: ECOG PERFORMANCE STATUS: 1 - Symptomatic but completely ambulatory  Vitals:   08/20/21 1432  BP: 131/65  Pulse: (!) 58  Resp: 17  Temp: (!) 97.5 F (36.4 C)  SpO2: 98%   Filed Weights   08/20/21 1432  Weight: 156 lb 14.4 oz (71.2 kg)    BREAST: No palpable masses or nodules in either right or left breasts. No palpable axillary supraclavicular or infraclavicular adenopathy no breast tenderness or nipple discharge. (exam performed in the presence of a chaperone)  LABORATORY DATA:  I have reviewed the data as listed CMP Latest Ref Rng & Units 05/20/2021 12/05/2020 05/26/2020  Glucose 70 - 99 mg/dL 97 100(H) 98  BUN 8 - 27 mg/dL _0 Creatinine 0.57 - 1.00 mg/dL 1.01(H) 0.89 0.86  Sodium 134 - 144 mmol/L 141 143 140  Potassium 3.5 - 5.2 mmol/L 4.3 4.1 3.9  Chloride 96 - 106 mmol/L 104 105 105  CO2 20 - 29 mmol/L _1 Calcium 8.7 - 10.3 mg/dL 9.6 9.7 9.6  Total Protein 6.0 - 8.5 g/dL 7.2 7.1 -  Total Bilirubin 0.0 - 1.2 mg/dL 0.6 0.3 -  Alkaline Phos 44 - 121 IU/L 114 112 -  AST 0 - 40 IU/L 22 21 -  ALT 0 - 32 IU/L 18 19 -    Lab Results  Component Value Date   WBC 4.0 05/20/2021   HGB 12.3 05/20/2021   HCT 36.3 05/20/2021   MCV 92 05/20/2021   PLT 225 05/20/2021   NEUTROABS 2.5 05/09/2017    ASSESSMENT & PLAN:  Breast cancer of lower-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 status post lumpectomy, Oncotype DX  recurrence score 29 intermediate risk 19% risk of recurrence with hormonal therapy alone   Chemotherapy summary: Taxotere and Cytoxan 4 started 05/07/2014 completed 07/09/2014 Long-term chemotherapy toxicities: Neuropathy Radiation therapy: Started 08/07/2014 completed 09/16/14 ----------------------------------------------------------------------------  Current treatment: Anastrozole 1 mg daily started 10/04/14 switched to letrozole 09/16/2015, switched to exemestane 06/30/2018 Patient took a 77-monthbreak from antiestrogen therapy and she resumed 06/30/2018.  We tried a half a tablet but because of breaking unevenly she decided to stay on the 1 tablet a day.  Discontinued February 2022    Breast Cancer Surveillance: 1. Breast exam 08/20/2021: Benign, extremely tender to palpation but no lumps or nodules. 2. Mammogram 07/24/2021: Benign. Postsurgical changes. Breast Density Category B.    Left chest wall tenderness:  There is a sharp pain when we pressed on the left chest wall posteriorly to the breast which is probably related to a rib fracture.  Return to clinic in 1 year for follow-up    No orders of the defined types were placed in this encounter.  The patient has a good understanding of the overall plan. she agrees with it. she will call with any problems that may develop before the next visit here.  Total time spent: 20 mins including face to face time and time spent for planning, charting and coordination of care  Rulon Eisenmenger, MD, MPH 08/20/2021  I, Thana Ates, am acting as scribe for Dr. Nicholas Lose.  I have reviewed the above documentation for accuracy and completeness, and I agree with the above.

## 2021-08-20 ENCOUNTER — Inpatient Hospital Stay: Payer: Medicare PPO | Attending: Hematology and Oncology | Admitting: Hematology and Oncology

## 2021-08-20 ENCOUNTER — Other Ambulatory Visit: Payer: Self-pay

## 2021-08-20 DIAGNOSIS — C50311 Malignant neoplasm of lower-inner quadrant of right female breast: Secondary | ICD-10-CM

## 2021-08-20 DIAGNOSIS — Z7982 Long term (current) use of aspirin: Secondary | ICD-10-CM | POA: Diagnosis not present

## 2021-08-20 DIAGNOSIS — Z79899 Other long term (current) drug therapy: Secondary | ICD-10-CM | POA: Diagnosis not present

## 2021-08-20 DIAGNOSIS — Z170 Estrogen receptor positive status [ER+]: Secondary | ICD-10-CM

## 2021-08-20 DIAGNOSIS — Z9221 Personal history of antineoplastic chemotherapy: Secondary | ICD-10-CM | POA: Insufficient documentation

## 2021-08-20 DIAGNOSIS — Z923 Personal history of irradiation: Secondary | ICD-10-CM | POA: Insufficient documentation

## 2021-08-20 DIAGNOSIS — Z79811 Long term (current) use of aromatase inhibitors: Secondary | ICD-10-CM | POA: Insufficient documentation

## 2021-08-20 NOTE — Assessment & Plan Note (Signed)
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 status post lumpectomy, Oncotype DX recurrence score 29 intermediate risk 19% risk of recurrence with hormonal therapy alone  Chemotherapy summary: Taxotere and Cytoxan 4 started 05/07/2014 completed 07/09/2014 Long-term chemotherapy toxicities: Neuropathy Radiation therapy: Started 08/07/2014 completed 09/16/14 ---------------------------------------------------------------------------- Current treatment:Anastrozole 1 mg daily started 10/04/14 switched to letrozole 09/16/2015, switched to exemestane12/27/2019 Patient took a 76-monthbreak from antiestrogen therapy and sheresumed 06/30/2018.  We tried a half a tablet but because of breaking unevenly she decided to stay on the 1 tablet a day.  Letrozoletoxicities:  1. Hair loss 2. Constipation 3. Body pains: Especially along her thighs.  She is getting physical therapy.  Recommended taking half tab daily.  Breast Cancer Surveillance: 1. Breast exam2/16/2023: Benign, extremely tender to palpation but no lumps or nodules. 2. Mammogram1/20/2023: Benign. Postsurgical changes. Breast Density CategoryB.  Return to clinic in 1 year for follow-up

## 2021-08-21 ENCOUNTER — Telehealth: Payer: Self-pay | Admitting: Hematology and Oncology

## 2021-08-21 NOTE — Telephone Encounter (Signed)
Scheduled appointment per 2/16 los. Left message. Patient will be mailed an updated calendar. °

## 2021-08-25 ENCOUNTER — Ambulatory Visit (HOSPITAL_BASED_OUTPATIENT_CLINIC_OR_DEPARTMENT_OTHER): Payer: Medicare PPO | Attending: Cardiovascular Disease | Admitting: Cardiovascular Disease

## 2021-08-25 ENCOUNTER — Other Ambulatory Visit: Payer: Self-pay

## 2021-08-25 DIAGNOSIS — G4719 Other hypersomnia: Secondary | ICD-10-CM | POA: Insufficient documentation

## 2021-08-25 DIAGNOSIS — G4733 Obstructive sleep apnea (adult) (pediatric): Secondary | ICD-10-CM | POA: Diagnosis not present

## 2021-08-25 DIAGNOSIS — G478 Other sleep disorders: Secondary | ICD-10-CM | POA: Diagnosis not present

## 2021-08-25 DIAGNOSIS — I480 Paroxysmal atrial fibrillation: Secondary | ICD-10-CM | POA: Insufficient documentation

## 2021-08-25 IMAGING — CT CT ABD-PELV W/ CM
2 of 5 series · 16 of 46 positions shown, 18 images · IV contrast (ISOVUE 300)
Comparison: 05/14/2014

CLINICAL DATA: Abdominal pain. Weight gain. Personal history of
breast carcinoma.

EXAM:
CT ABDOMEN AND PELVIS WITH CONTRAST
TECHNIQUE: Multidetector CT imaging of the abdomen and pelvis was performed
using the standard protocol following bolus administration of
intravenous contrast.
CONTRAST:  100mL OMNIPAQUE IOHEXOL 300 MG/ML  SOLN

[Series 2: axial st · axial · 0.76mm/px · z∈[+1363,+1713]mm · 13 of 82 slices shown, 15 images]
[im 6/82  soft-tissue]
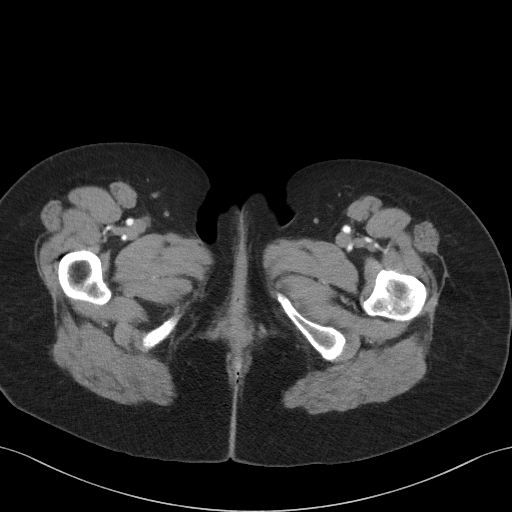
[im 6/82  bone]
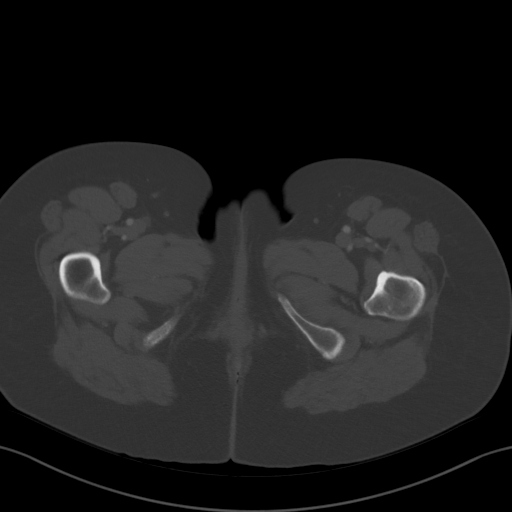
[im 12/82  soft-tissue]
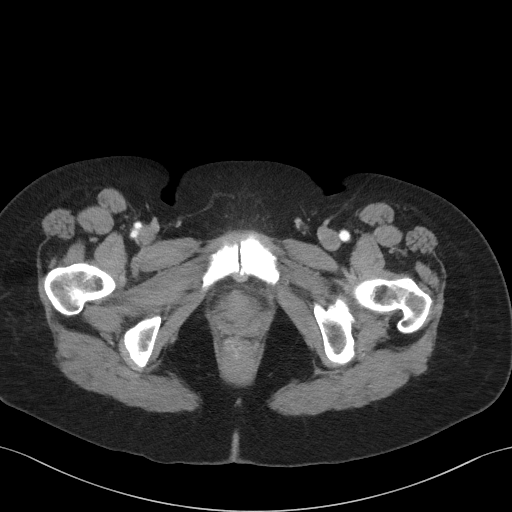
[im 18/82  soft-tissue]
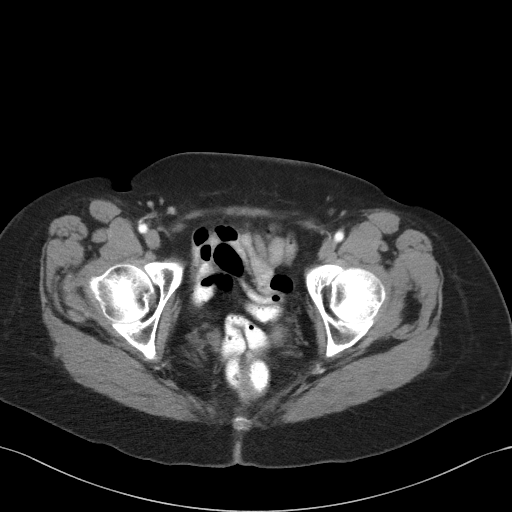
[im 24/82  soft-tissue]
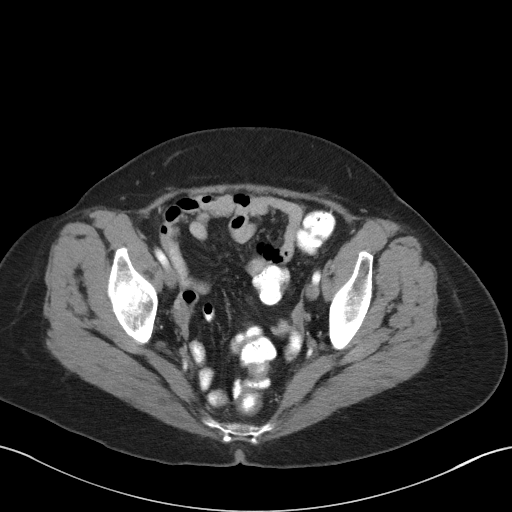
[im 29/82  soft-tissue]
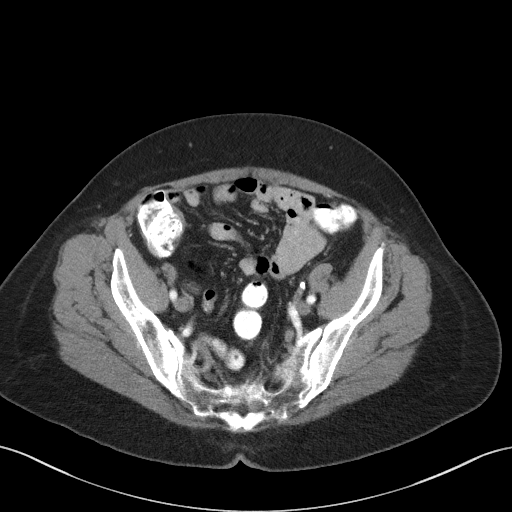
[im 35/82  soft-tissue]
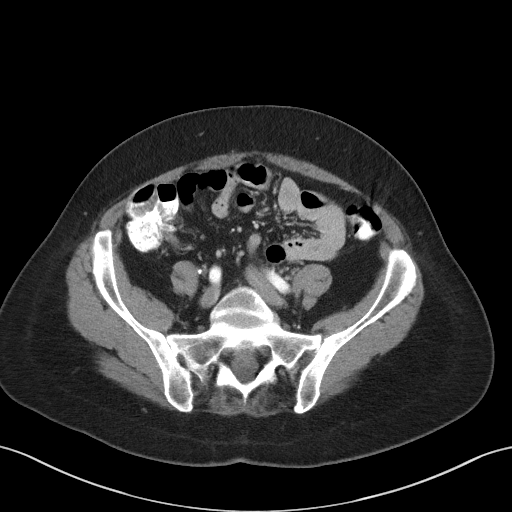
[im 41/82  soft-tissue]
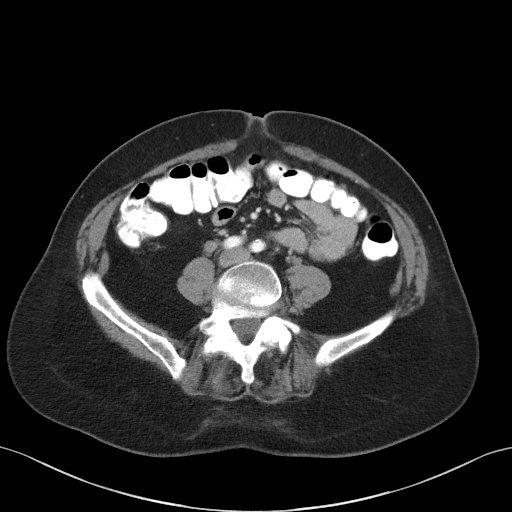
[im 47/82  soft-tissue]
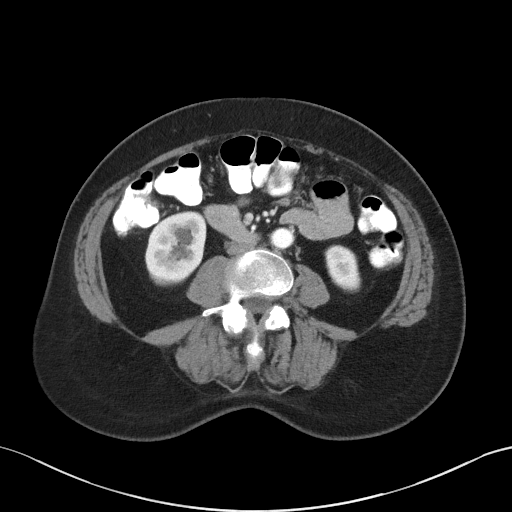
[im 53/82  soft-tissue]
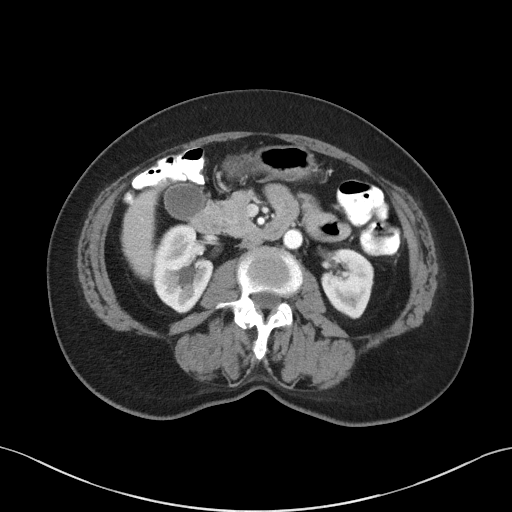
[im 53/82  bone]
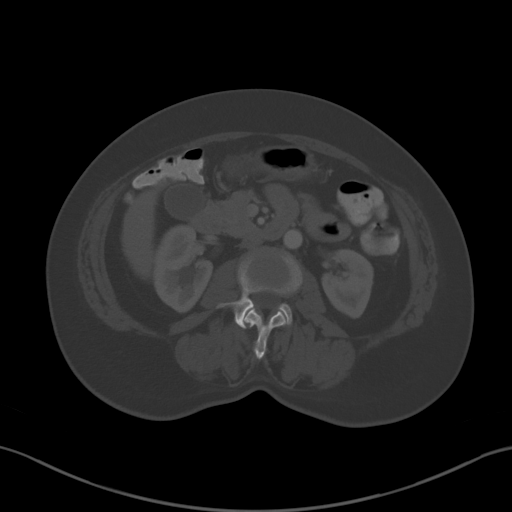
[im 58/82  soft-tissue]
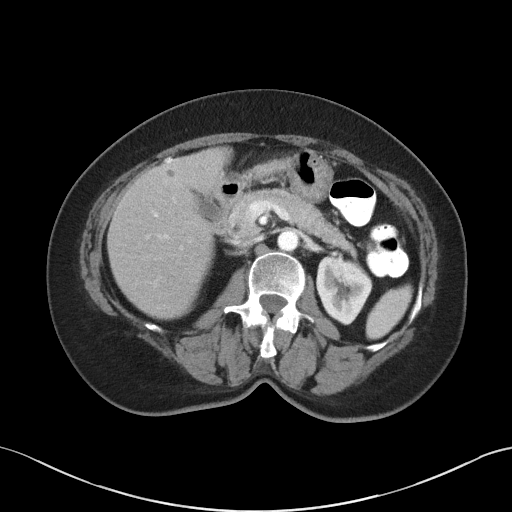
[im 64/82  soft-tissue]
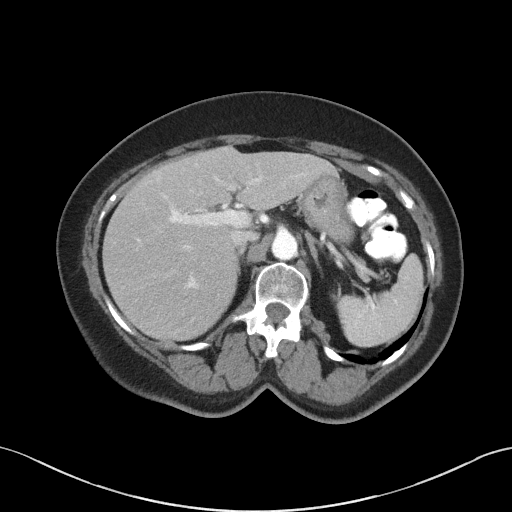
[im 70/82  soft-tissue]
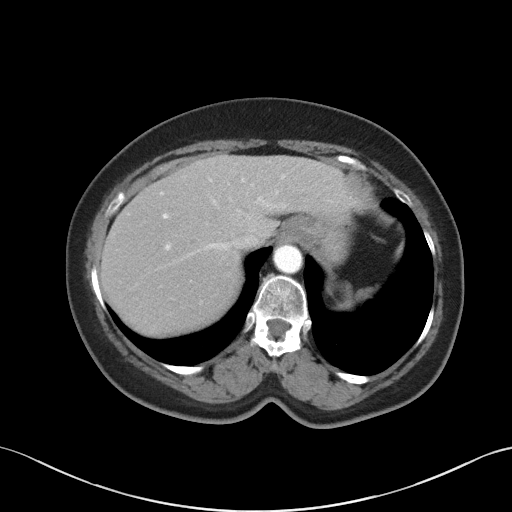
[im 76/82  soft-tissue]
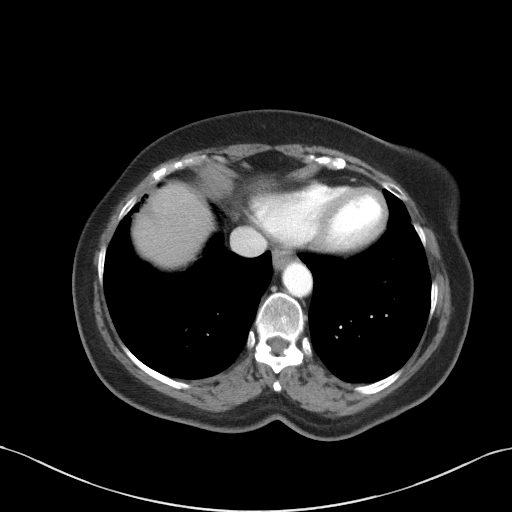

[Series 5: coronal st · coronal · 0.68mm/px · 3 of 93 slices shown]
[im 31/93  soft-tissue]
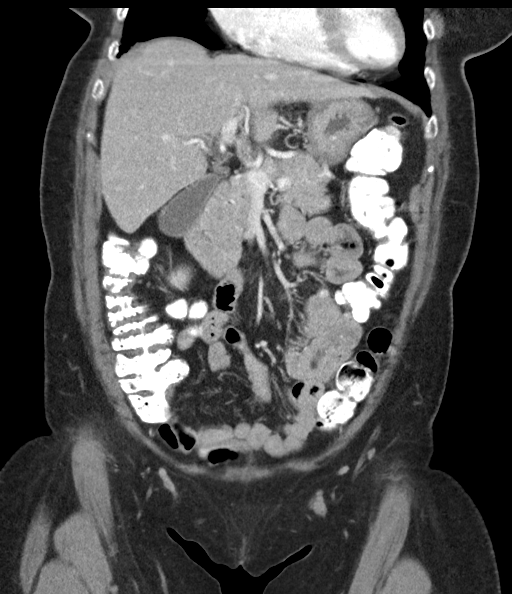
[im 41/93  soft-tissue]
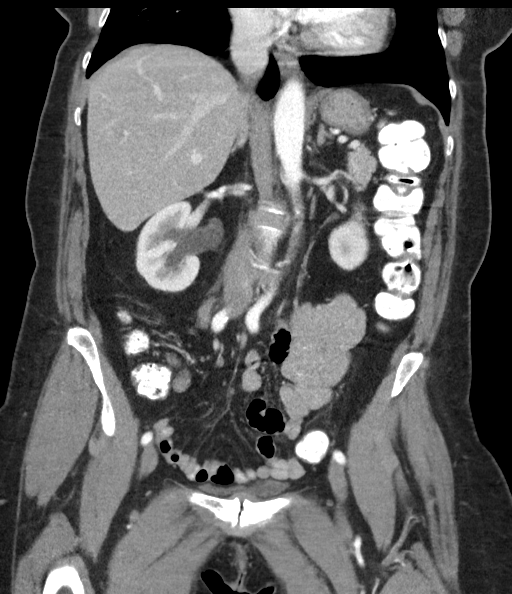
[im 52/93  soft-tissue]
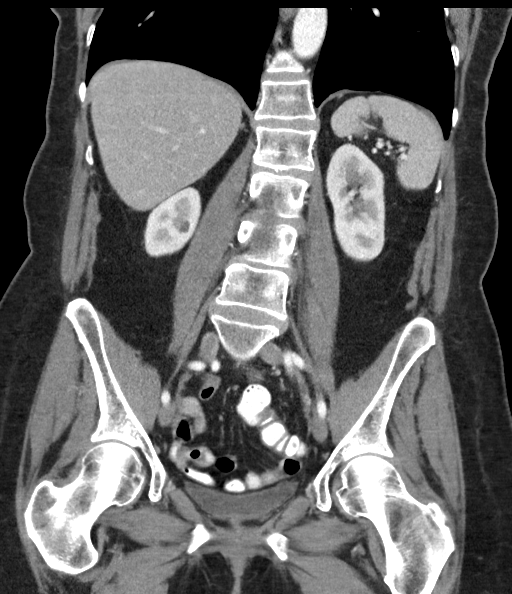

[16 of 46 positions shown; findings below may reference images not displayed]

FINDINGS: Lower Chest: No acute findings.

Hepatobiliary: No hepatic masses identified. Tiny cyst again seen
near junction of right and left lobes. Gallbladder is unremarkable.
No evidence of biliary ductal dilatation.

Pancreas:  No mass or inflammatory changes.

Spleen: Within normal limits in size and appearance.

Adrenals/Urinary Tract: No masses identified. A few tiny sub-cm
right renal cysts are again noted. No evidence of ureteral calculi
or hydronephrosis.

Stomach/Bowel: No evidence of obstruction, inflammatory process or
abnormal fluid collections.

Vascular/Lymphatic: No pathologically enlarged lymph nodes. No
abdominal aortic aneurysm. Aortic atherosclerotic calcification
noted.

Reproductive: Prior hysterectomy noted. Adnexal regions are
unremarkable in appearance.

Other:  None.

Musculoskeletal:  No suspicious bone lesions identified.
IMPRESSION: No acute findings or other significant abnormality.

Aortic Atherosclerosis ([id number]).

## 2021-09-03 DIAGNOSIS — R946 Abnormal results of thyroid function studies: Secondary | ICD-10-CM | POA: Diagnosis not present

## 2021-09-03 DIAGNOSIS — Z0000 Encounter for general adult medical examination without abnormal findings: Secondary | ICD-10-CM | POA: Diagnosis not present

## 2021-09-03 DIAGNOSIS — I4891 Unspecified atrial fibrillation: Secondary | ICD-10-CM | POA: Diagnosis not present

## 2021-09-03 DIAGNOSIS — R7309 Other abnormal glucose: Secondary | ICD-10-CM | POA: Diagnosis not present

## 2021-09-03 DIAGNOSIS — E7800 Pure hypercholesterolemia, unspecified: Secondary | ICD-10-CM | POA: Diagnosis not present

## 2021-09-07 ENCOUNTER — Encounter (HOSPITAL_BASED_OUTPATIENT_CLINIC_OR_DEPARTMENT_OTHER): Payer: Self-pay | Admitting: Cardiovascular Disease

## 2021-09-07 NOTE — Procedures (Signed)
° ° ° ° ° °  Patient Name: Tami [name], Tami [name] Date: 08/25/2021 Gender: Female D.O.B: 27-May-1951 Age (years): 82 Referring Provider: Shelva Majestic MD, ABSM Height (inches): 64 Interpreting Physician: Shelva Majestic MD, ABSM Weight (lbs): 152 RPSGT: Jacolyn Reedy BMI: 26 MRN: [id number] Neck Size: 14.00  CLINICAL INFORMATION Sleep Study Type: HST  Indication for sleep study: Fatigue, Snoring, PAF  Epworth Sleepiness Score: 13  Most recent polysomnogram dated 11/06/2017 revealed an AHI of 0.3/h and RDI of 12.6/h.  SLEEP STUDY TECHNIQUE A multi-channel overnight portable sleep study was performed. The channels recorded were: nasal airflow, thoracic respiratory movement, and oxygen saturation with a pulse oximetry. Snoring was also monitored.  MEDICATIONS albuterol (VENTOLIN HFA) 108 (90 Base) MCG/ACT inhaler amLODipine (NORVASC) 5 MG tablet (Expired) aspirin EC 81 MG tablet Calcium Carbonate-Vitamin D (CALTRATE 600+D PO) cetirizine (ZYRTEC) 10 MG tablet Cholecalciferol (VITAMIN D3) 25 MCG (1000 UT) CAPS esomeprazole (NEXIUM) 40 MG capsule flecainide (TAMBOCOR) 100 MG tablet fluticasone (FLONASE) 50 MCG/ACT nasal spray hyoscyamine (LEVSIN SL) 0.125 MG SL tablet ibuprofen (ADVIL,MOTRIN) 200 MG tablet Meth-Hyo-M Bl-Na Phos-Ph Sal (URIBEL) 118 MG CAPS metoprolol succinate (TOPROL-XL) 25 MG 24 hr tablet metoprolol tartrate (LOPRESSOR) 25 MG tablet PARoxetine (PAXIL) 20 MG tablet rosuvastatin (CRESTOR) 10 MG tablet  Patient self administered medications include: N/A.  SLEEP ARCHITECTURE Patient was studied for 539 minutes. The sleep efficiency was 100.0 % and the patient was supine for 95.7%. The arousal index was 0.0 per hour.  RESPIRATORY PARAMETERS The overall AHI was 49.9 per hour, with a central apnea index of 0.1 per hour. There is a significant positional component with supine AHI 51.6/h versus non-supine sleep AHI 12.8/h.  The oxygen nadir was 91% during  sleep.  CARDIAC DATA Mean heart rate during sleep was 55.4 bpm.  IMPRESSIONS - Severe obstructive sleep apnea occurred during this study (AHI 49.9/h). - No significant central sleep apnea occurred during this study (CAI = 0.1/h). - The patient had minimal or no oxygen desaturation during the study (Min O2 91%) - Patient snored fro 34.5 minutes (6.4%) during the sleep.  DIAGNOSIS - Obstructive Sleep Apnea (G47.33)  RECOMMENDATIONS - In this patient with cardiovascular co-morbidities recommend therapeutic CPAP for treatment of her sleep disordered breathing. If unable to have an in-lab titration, initiate Auto-PAP with EPR of 3 at 7- 18 cm of water. - Effort should be made to optimize nasal and oropharyngeal patency. - The patient should be counseled to avoid supine sleep; consider positional therapy. - Avoid alcohol, sedatives and other CNS depressants that may worsen sleep apnea and disrupt normal sleep architecture. - Sleep hygiene should be reviewed to assess factors that may improve sleep quality. - Weight management and regular exercise should be initiated or continued. - Recommend a download and sleep clinic evaluation after one month of therapy.    [Electronically signed] 09/07/2021 03:12 PM  Shelva Majestic MD, [facility], [place], American Board of Sleep Medicine   NPI: [id number] [place] PH: [phone]   FX: [phone] [place]

## 2021-09-10 DIAGNOSIS — Z0000 Encounter for general adult medical examination without abnormal findings: Secondary | ICD-10-CM | POA: Diagnosis not present

## 2021-09-10 DIAGNOSIS — M5136 Other intervertebral disc degeneration, lumbar region: Secondary | ICD-10-CM | POA: Diagnosis not present

## 2021-09-10 DIAGNOSIS — Z853 Personal history of malignant neoplasm of breast: Secondary | ICD-10-CM | POA: Diagnosis not present

## 2021-09-10 DIAGNOSIS — I480 Paroxysmal atrial fibrillation: Secondary | ICD-10-CM | POA: Diagnosis not present

## 2021-10-16 ENCOUNTER — Other Ambulatory Visit: Payer: Self-pay | Admitting: Obstetrics & Gynecology

## 2021-10-16 DIAGNOSIS — M8580 Other specified disorders of bone density and structure, unspecified site: Secondary | ICD-10-CM

## 2021-10-20 ENCOUNTER — Other Ambulatory Visit: Payer: Self-pay | Admitting: Medical

## 2021-11-07 ENCOUNTER — Other Ambulatory Visit: Payer: Self-pay | Admitting: Hematology and Oncology

## 2021-11-07 DIAGNOSIS — F419 Anxiety disorder, unspecified: Secondary | ICD-10-CM

## 2021-12-12 ENCOUNTER — Other Ambulatory Visit: Payer: Self-pay | Admitting: Cardiovascular Disease

## 2021-12-14 ENCOUNTER — Other Ambulatory Visit: Payer: Self-pay

## 2021-12-14 MED ORDER — METOPROLOL SUCCINATE ER 25 MG PO TB24: 25.0000 mg | ORAL_TABLET | Freq: Every day | ORAL | 3 refills | Status: DC

## 2022-01-12 DIAGNOSIS — M2550 Pain in unspecified joint: Secondary | ICD-10-CM | POA: Diagnosis not present

## 2022-01-12 DIAGNOSIS — M25561 Pain in right knee: Secondary | ICD-10-CM | POA: Diagnosis not present

## 2022-01-12 DIAGNOSIS — M549 Dorsalgia, unspecified: Secondary | ICD-10-CM | POA: Diagnosis not present

## 2022-01-12 DIAGNOSIS — M5136 Other intervertebral disc degeneration, lumbar region: Secondary | ICD-10-CM | POA: Diagnosis not present

## 2022-01-12 DIAGNOSIS — E559 Vitamin D deficiency, unspecified: Secondary | ICD-10-CM | POA: Diagnosis not present

## 2022-01-12 DIAGNOSIS — M25562 Pain in left knee: Secondary | ICD-10-CM | POA: Diagnosis not present

## 2022-01-13 DIAGNOSIS — M5136 Other intervertebral disc degeneration, lumbar region: Secondary | ICD-10-CM | POA: Diagnosis not present

## 2022-01-13 DIAGNOSIS — M2550 Pain in unspecified joint: Secondary | ICD-10-CM | POA: Diagnosis not present

## 2022-01-24 ENCOUNTER — Other Ambulatory Visit: Payer: Self-pay | Admitting: Cardiovascular Disease

## 2022-01-28 DIAGNOSIS — R749 Abnormal serum enzyme level, unspecified: Secondary | ICD-10-CM | POA: Diagnosis not present

## 2022-02-24 DIAGNOSIS — R945 Abnormal results of liver function studies: Secondary | ICD-10-CM | POA: Diagnosis not present

## 2022-03-25 ENCOUNTER — Telehealth: Payer: Self-pay | Admitting: Cardiovascular Disease

## 2022-03-25 DIAGNOSIS — Z79899 Other long term (current) drug therapy: Secondary | ICD-10-CM

## 2022-03-25 NOTE — Telephone Encounter (Signed)
  Pt c/o medication issue:  1. Name of Medication: crestor   2. How are you currently taking this medication (dosage and times per day)?  As written   3. Are you having a reaction (difficulty breathing--STAT)? No   4. What is your medication issue? Pt said, she had lab work done at her pcp DR. Collins, her liver function was elevated and Dr. Theda Sers discontinued her crestor. After stopping crestor they did another lab work and liver function was back to normal. She was told to call Dr. Claiborne Billings for recommendation. She said, she doesn't have the result of labs but she said can request to get a copy from her pcp

## 2022-03-25 NOTE — Telephone Encounter (Signed)
Returned call to patient, patient reports labs drawn at PCP on 8/23. LFTS were elevated (on crestor 10).  PCP stopped crestor and repeated labs, LFTS returned to normal.   Patient concerned as she says her LDL increases significantly off of the crestor.   She states LFTS have been normal in the past on crestor 5.     Will request labs and last OV from PCP.  Will send to Dr. Claiborne Billings to review for recommendations.  Patient aware.    Left message with medical records to fax to [phone]

## 2022-04-01 NOTE — Telephone Encounter (Signed)
Patient is following up requesting an update. Patient would like to know if labs been reviewed by Dr. Claiborne Billings and whether or not he agrees with medication changes. Please advise.

## 2022-04-02 NOTE — Telephone Encounter (Signed)
Labs are under the media tab- I will have Dr.Kelly review when back in office.   Thanks!

## 2022-04-06 NOTE — Telephone Encounter (Signed)
Recent laboratory showed elevated LFTs.  Agree with holding rosuvastatin for now.  Recommend recheck LFTs in 3 to 4 weeks.  May need to institute Zetia or reduced dose statin if LFTs normalize

## 2022-04-07 ENCOUNTER — Other Ambulatory Visit: Payer: Self-pay

## 2022-04-07 DIAGNOSIS — Z79899 Other long term (current) drug therapy: Secondary | ICD-10-CM

## 2022-04-07 NOTE — Telephone Encounter (Signed)
Pt is returning call in regards to what she should do about her medication and follow-up recommendations. Requesting a return call.

## 2022-04-07 NOTE — Telephone Encounter (Signed)
Patient stated she has not taken Crestor for at least 2 months. She will come in the morning for fasting lipids. Order placed.

## 2022-04-07 NOTE — Telephone Encounter (Signed)
ok 

## 2022-04-08 DIAGNOSIS — Z79899 Other long term (current) drug therapy: Secondary | ICD-10-CM | POA: Diagnosis not present

## 2022-04-09 LAB — HEPATIC FUNCTION PANEL
ALT: 24 IU/L (ref 0–32)
AST: 27 IU/L (ref 0–40)
Albumin: 4.5 g/dL (ref 3.8–4.8)
Alkaline Phosphatase: 91 IU/L (ref 44–121)
Bilirubin Total: 0.4 mg/dL (ref 0.0–1.2)
Bilirubin, Direct: 0.13 mg/dL (ref 0.00–0.40)
Total Protein: 7.2 g/dL (ref 6.0–8.5)

## 2022-04-23 ENCOUNTER — Ambulatory Visit
Admission: RE | Admit: 2022-04-23 | Discharge: 2022-04-23 | Disposition: A | Discharge status: Home / Self Care | Payer: Medicare PPO | Source: Ambulatory Visit | Attending: Obstetrics & Gynecology | Admitting: Obstetrics & Gynecology

## 2022-04-23 DIAGNOSIS — M8580 Other specified disorders of bone density and structure, unspecified site: Secondary | ICD-10-CM

## 2022-04-23 DIAGNOSIS — Z780 Asymptomatic menopausal state: Secondary | ICD-10-CM | POA: Diagnosis not present

## 2022-04-23 DIAGNOSIS — M8589 Other specified disorders of bone density and structure, multiple sites: Secondary | ICD-10-CM | POA: Diagnosis not present

## 2022-05-07 DIAGNOSIS — M8580 Other specified disorders of bone density and structure, unspecified site: Secondary | ICD-10-CM | POA: Diagnosis not present

## 2022-05-13 ENCOUNTER — Other Ambulatory Visit: Payer: Self-pay | Admitting: Cardiovascular Disease

## 2022-05-19 DIAGNOSIS — Z23 Encounter for immunization: Secondary | ICD-10-CM | POA: Diagnosis not present

## 2022-05-20 ENCOUNTER — Other Ambulatory Visit: Payer: Self-pay

## 2022-05-20 ENCOUNTER — Telehealth: Payer: Self-pay | Admitting: Cardiovascular Disease

## 2022-05-20 DIAGNOSIS — E7800 Pure hypercholesterolemia, unspecified: Secondary | ICD-10-CM

## 2022-05-20 NOTE — Telephone Encounter (Signed)
Patient would like for an order for her cholesterol to be check, she states she has been off of her cholesterol medication for awhile.  So she would like to know her LDL.  She is going away for the weekend. She would like to have it done before she leaves.

## 2022-05-20 NOTE — Telephone Encounter (Signed)
Patient stated she has been off rosuvastatin for several months due to not feeling well when she was on it. She wanted to have her chol checked. Dr. Claiborne Billings ordered fasting lipids. Patient advised and will come to clinic tomorrow. Order placed.

## 2022-05-21 DIAGNOSIS — E7800 Pure hypercholesterolemia, unspecified: Secondary | ICD-10-CM | POA: Diagnosis not present

## 2022-05-22 LAB — LIPID PANEL
Chol/HDL Ratio: 3.4 ratio (ref 0.0–4.4)
Cholesterol, Total: 235 mg/dL — ABNORMAL HIGH (ref 100–199)
HDL: 70 mg/dL (ref >39)
LDL Chol Calc (NIH): 139 mg/dL — ABNORMAL HIGH (ref 0–99)
Triglycerides: 146 mg/dL (ref 0–149)
VLDL Cholesterol Cal: 26 mg/dL (ref 5–40)

## 2022-06-15 ENCOUNTER — Ambulatory Visit: Payer: TRICARE For Life (TFL) | Attending: Internal Medicine | Admitting: Student

## 2022-06-15 DIAGNOSIS — E782 Mixed hyperlipidemia: Secondary | ICD-10-CM | POA: Diagnosis not present

## 2022-06-15 MED ORDER — EZETIMIBE 10 MG PO TABS: 10.0000 mg | ORAL_TABLET | Freq: Every day | ORAL | 3 refills | Status: DC

## 2022-06-15 NOTE — Progress Notes (Signed)
Patient ID: Tami [name]                 DOB: Dec 23, 1950                    MRN: [id number]      HPI: Tami [name] is a 71 y.o. female patient referred to lipid clinic by [facility]. PMH is significant for PAF(paroxysmal atrial fibrillation), hypertension, GERD, HDL, breast cancer survivor.    Today we reviewed options for lowering LDL cholesterol with patient, including ezetimibe, PCSK-9 inhibitors, bempedoic acid and inclisiran.  Discussed mechanisms of action, dosing, side effects and potential decreases in LDL cholesterol.  Also reviewed cost information and potential options for patient assistance. Patient was on rosuvastatin 5 mg which was causing mild to moderate muscle pain and was tolerable but LDLc came back high so dose was increased to 10 mg which caused severe muscle weakness and her liver function test came back abnormal (enzymes elevated) so PCP had stopped July 2023. After stopping Crestor LFT came back normal. Patient reports she had tired ezetimibe and pitavastatin 2 mg in the past and she had severe muscle weakness,(unable to perform daily activity) from those agents too.   Diet: mostly eats healthy diet - low fat, low salt   Exercise: no scheduled activity but active around the house: motivated to start regular walking with some chair exercise total of 150 min per week  Family History: sister - heart problem,hypertension  breast cancer, Mother- hypertension (in her 67's) other sister- heart attack   Social History:  Alcohol: none Smoking: never  Labs: Lipid Panel     Component Value Date/Time   CHOL 235 (H) 05/21/2022 1114   CHOL 237 (H) 06/22/2013 1405   TRIG 146 05/21/2022 1114   TRIG 103 06/22/2013 1405   HDL 70 05/21/2022 1114   HDL 74 06/22/2013 1405   CHOLHDL 3.4 05/21/2022 1114   CHOLHDL 3.2 07/28/2016 1151   VLDL 22 07/28/2016 1151   [place] 139 (H) 05/21/2022 1114   LDLCALC 142 (H) 06/22/2013 1405   LABVLDL 26 05/21/2022 1114    Past Medical  History:  Diagnosis Date   Allergy    Anemia    as a child   Anxiety    Arthritis    Asthma    Atrial fibrillation ([place])    Breast cancer ([place]) 2015   Cataract    Depression with anxiety    Dysrhythmia    hx AF   Family history of malignant neoplasm of breast    Fibromyalgia    GERD (gastroesophageal reflux disease)    Heart palpitations    Heartburn    Hyperlipidemia    Invasive ductal carcinoma of right breast ([place]) 02/08/2014   Lower Inner Quadrant   Irregular heartbeat    Malignant neoplasm of breast (female), unspecified site    Migraine    Numbness of arm    Right Arm   Osteopenia    Osteoporosis    Personal history of chemotherapy 2015   Right Breast Cancer   Personal history of radiation therapy 2015   Right Breast Cancer   Prediabetes    Pure hypercholesterolemia    SVT (supraventricular tachycardia) (H[facility])     Current Outpatient Medications on File Prior to Visit  Medication Sig Dispense Refill   albuterol (VENTOLIN HFA) 108 (90 Base) MCG/ACT inhaler Inhale 2 puffs into the lungs every 6 (six) hours as needed for wheezing or shortness of breath. 8 g 2  amLODipine (NORVASC) 5 MG tablet Take 1 tablet (5 mg total) by mouth daily. 90 tablet 3   aspirin EC 81 MG tablet Take 81 mg by mouth daily.     Calcium Carbonate-Vitamin D (CALTRATE 600+D PO) Take by mouth.     cetirizine (ZYRTEC) 10 MG tablet Take 10 mg by mouth daily.     Cholecalciferol (VITAMIN D3) 25 MCG (1000 UT) CAPS Take 1 capsule by mouth daily.     esomeprazole (NEXIUM) 40 MG capsule Take 1 capsule (40 mg total) by mouth daily at 12 noon. 90 capsule 3   flecainide (TAMBOCOR) 100 MG tablet Take 1/2 (one-half) tablet by mouth twice daily 90 tablet 3   fluticasone (FLONASE) 50 MCG/ACT nasal spray Place 2 sprays into both nostrils daily. 16 g 3   hyoscyamine (LEVSIN SL) 0.125 MG SL tablet Place 1 tablet (0.125 mg total) under the tongue every 4 (four) hours as needed (Abdominal Pain, spasm, IBS). 60  tablet 4   ibuprofen (ADVIL,MOTRIN) 200 MG tablet Take 200 mg by mouth every 6 (six) hours as needed for headache or moderate pain.     Meth-Hyo-M Bl-Na Phos-Ph Sal (URIBEL) 118 MG CAPS Take 118 mg by mouth as needed (for bladder). Reported on 09/09/2015  2   metoprolol succinate (TOPROL-XL) 25 MG 24 hr tablet Take 1 tablet by mouth once daily 90 tablet 0   metoprolol succinate (TOPROL-XL) 25 MG 24 hr tablet Take 1 tablet (25 mg total) by mouth daily. 90 tablet 3   metoprolol tartrate (LOPRESSOR) 25 MG tablet Take 1 tablet (25 mg) as needed for palpitations 30 tablet 11   PARoxetine (PAXIL) 20 MG tablet TAKE 1 TABLET BY MOUTH ONCE DAILY . APPOINTMENT REQUIRED FOR FUTURE REFILLS 90 tablet 3   rosuvastatin (CRESTOR) 10 MG tablet Take 1 tablet (10 mg total) by mouth daily. 90 tablet 3   No current facility-administered medications on file prior to visit.    Allergies  Allergen Reactions   Latex Swelling and Rash    Skin swelling    Sulfonamide Derivatives Anaphylaxis, Shortness Of Breath, Nausea Only and Other (See Comments)    Sweating, headache    Clarithromycin Nausea Only and Other (See Comments)    Headache    Crestor [Rosuvastatin] Other (See Comments)    Muscle aches, pain, weakness in lower extremities   Statins Other (See Comments)    Muscle soreness and weakness w/ Rosuvastatin and Zetia.   Ciprofloxacin Hcl Nausea Only, Palpitations and Other (See Comments)    Headache    BP Readings from Last 3 Encounters:  08/20/21 131/65  07/30/21 (!) 162/82  06/08/21 (!) 149/66    Problem  Hyperlipidemia   Current Medications: none Intolerances: muscle weakness,(unable to perform daily activity) - rosuvastatin 5 mg ezetimibe 10 mg-, pitavastatin 2 mg  Elevated LFT's  and muscle weakness - rosuvastatin 10 mg  Risk Factors: hypertension, 10 years ASCVD risk ~17% LDL goal: <100 mg/dl recent LDLc 139 (05/2022)    Hyperlipidemia Assessment:  LDL goal: < 100 mg/dl last LDLc 139 mg/dl  (05/2022) Currently not on any lipid lowering agent Intolerance to moderate intensity  and low intensity statins  Discussed next potential options (Ezetimibe, PCSK-9 inhibitors, bempedoic acid and inclisiran); cost, dosing efficacy, side effects  Patient like to try ezetimibe first if tolerated well, in 1 month we will initiate Nexlizet to get LDLc closer to the goal   Plan: Start taking ezetimibe 10 mg daily  Follow up has been scheduled with  Pharma D in 1 month  In future consider adding Nexletol     Thank you,  Cammy Copa, Pharm.D [facility] A Division of [facility] [place] [address] [place], [place] [address]  Phone: [phone]; Fax: [phone]

## 2022-06-15 NOTE — Patient Instructions (Addendum)
Changes made by your pharmacist Cammy Copa, PharmD at today's visit:    Instructions/Changes  (what do you need to do) Your Notes  (what you did and when you did it)   Start taking Ezetimibe 10 mg daily       If you have any questions or concerns please use My Chart to send questions or call the office at [phone]

## 2022-06-15 NOTE — Assessment & Plan Note (Signed)
Assessment:  LDL goal: < 100 mg/dl last LDLc 139 mg/dl (05/2022) Currently not on any lipid lowering agent Intolerance to moderate intensity  and low intensity statins  Discussed next potential options (Ezetimibe, PCSK-9 inhibitors, bempedoic acid and inclisiran); cost, dosing efficacy, side effects  Patient like to try ezetimibe first if tolerated well, in 1 month we will initiate Nexlizet to get LDLc closer to the goal   Plan: Start taking ezetimibe 10 mg daily  Follow up has been scheduled with Pharma D in 1 month  In future consider adding Nexletol

## 2022-07-15 ENCOUNTER — Ambulatory Visit: Payer: TRICARE For Life (TFL)

## 2022-07-17 NOTE — Progress Notes (Deleted)
Office Visit    Patient Name: Tami [name] Date of Encounter: 07/17/2022  Primary Care Provider:  Janie Morning, DO Primary Cardiologist:  Shelva Majestic, MD  Chief Complaint    Hyperlipidemia   Significant Past Medical History                     Allergies  Allergen Reactions   Latex Swelling and Rash    Skin swelling    Sulfonamide Derivatives Anaphylaxis, Shortness Of Breath, Nausea Only and Other (See Comments)    Sweating, headache    Clarithromycin Nausea Only and Other (See Comments)    Headache    Crestor [Rosuvastatin] Other (See Comments)    Muscle aches, pain, weakness in lower extremities   Statins Other (See Comments)    Muscle soreness and weakness w/ Rosuvastatin and Zetia.   Ciprofloxacin Hcl Nausea Only, Palpitations and Other (See Comments)    Headache     History of Present Illness    Tami [name] is a 72 y.o. female patient of Dr Claiborne Billings  CAC= 0  Insurance Carrier:   LDL Cholesterol goal:    Current Medications:   ezetimibe 10 mg  Previously tried:  pitavastatin, rosuvastatin - myalgias, rosuvastatin - increase in LFT's  Diet: mostly eats healthy diet - low fat, low salt    Exercise: no scheduled activity but active around the house: motivated to start regular walking with some chair exercise total of 150 min per week   Family History: sister - heart problem,hypertension  breast cancer, Mother- hypertension (in her 64's) other sister- heart attack    Social History:  Alcohol: none Smoking: never    Adherence Assessment  Do you ever forget to take your medication? []$ Yes []$ No  Do you ever skip doses due to side effects? []$ Yes []$ No  Do you have trouble affording your medicines? []$ Yes []$ No  Are you ever unable to pick up your medication due to transportation difficulties? []$ Yes []$ No  Do you ever stop taking your medications because you don't believe they are helping? []$ Yes []$ No  Do you check your weight daily? []$ Yes []$ No    Adherence strategy: ***  Barriers to obtaining medications: ***     Accessory Clinical Findings   Lab Results  Component Value Date   CHOL 235 (H) 05/21/2022   HDL 70 05/21/2022   LDLCALC 139 (H) 05/21/2022   TRIG 146 05/21/2022   CHOLHDL 3.4 05/21/2022    Lab Results  Component Value Date   ALT 24 04/08/2022   AST 27 04/08/2022   ALKPHOS 91 04/08/2022   BILITOT 0.4 04/08/2022   Lab Results  Component Value Date   CREATININE 1.01 (H) 05/20/2021   BUN 9 05/20/2021   NA 141 05/20/2021   K 4.3 05/20/2021   CL 104 05/20/2021   CO2 26 05/20/2021   No results found for: "HGBA1C"  Home Medications    Current Outpatient Medications  Medication Sig Dispense Refill   albuterol (VENTOLIN HFA) 108 (90 Base) MCG/ACT inhaler Inhale 2 puffs into the lungs every 6 (six) hours as needed for wheezing or shortness of breath. 8 g 2   amLODipine (NORVASC) 5 MG tablet Take 1 tablet (5 mg total) by mouth daily. 90 tablet 3   aspirin EC 81 MG tablet Take 81 mg by mouth daily.     Calcium Carbonate-Vitamin D (CALTRATE 600+D PO) Take by mouth.     cetirizine (ZYRTEC) 10 MG tablet Take 10 mg by mouth daily.  Cholecalciferol (VITAMIN D3) 25 MCG (1000 UT) CAPS Take 1 capsule by mouth daily.     esomeprazole (NEXIUM) 40 MG capsule Take 1 capsule (40 mg total) by mouth daily at 12 noon. 90 capsule 3   ezetimibe (ZETIA) 10 MG tablet Take 1 tablet (10 mg total) by mouth daily. 90 tablet 3   flecainide (TAMBOCOR) 100 MG tablet Take 1/2 (one-half) tablet by mouth twice daily 90 tablet 3   fluticasone (FLONASE) 50 MCG/ACT nasal spray Place 2 sprays into both nostrils daily. 16 g 3   hyoscyamine (LEVSIN SL) 0.125 MG SL tablet Place 1 tablet (0.125 mg total) under the tongue every 4 (four) hours as needed (Abdominal Pain, spasm, IBS). 60 tablet 4   ibuprofen (ADVIL,MOTRIN) 200 MG tablet Take 200 mg by mouth every 6 (six) hours as needed for headache or moderate pain.     Meth-Hyo-M Bl-Na Phos-Ph  Sal (URIBEL) 118 MG CAPS Take 118 mg by mouth as needed (for bladder). Reported on 09/09/2015  2   metoprolol succinate (TOPROL-XL) 25 MG 24 hr tablet Take 1 tablet by mouth once daily 90 tablet 0   metoprolol succinate (TOPROL-XL) 25 MG 24 hr tablet Take 1 tablet (25 mg total) by mouth daily. 90 tablet 3   metoprolol tartrate (LOPRESSOR) 25 MG tablet Take 1 tablet (25 mg) as needed for palpitations 30 tablet 11   PARoxetine (PAXIL) 20 MG tablet TAKE 1 TABLET BY MOUTH ONCE DAILY . APPOINTMENT REQUIRED FOR FUTURE REFILLS 90 tablet 3   rosuvastatin (CRESTOR) 10 MG tablet Take 1 tablet (10 mg total) by mouth daily. 90 tablet 3   No current facility-administered medications for this visit.     Assessment & Plan    No problem-specific Assessment & Plan notes found for this encounter.   Tommy Medal, PharmD CPP [facility] [address] [place]  [place], [place] [address] [phone]  07/17/2022, 3:40 PM

## 2022-07-19 ENCOUNTER — Ambulatory Visit: Payer: TRICARE For Life (TFL) | Attending: Internal Medicine

## 2022-07-23 ENCOUNTER — Ambulatory Visit: Payer: Medicare PPO | Attending: Cardiology | Admitting: Student

## 2022-07-23 DIAGNOSIS — E7849 Other hyperlipidemia: Secondary | ICD-10-CM | POA: Diagnosis not present

## 2022-07-23 NOTE — Progress Notes (Signed)
Patient ID: Tami [name]                 DOB: [birth year]                    MRN: [id number]      HPI: Tami [name] is a 72 y.o. female patient referred to lipid clinic by [facility]. PMH is significant for PAF(paroxysmal atrial fibrillation), hypertension, GERD, HDL, breast cancer survivor.    Today patient here for follow up to optimize her lipid medications. Patient started experiencing muscle cramps,shooting pain and weakness in lower extremities. Patient started having symptoms 2 weeks after starting ezetimibe. She has not change any thing in her routine. No new medications or new physical activity. We talked about starting some exercise routine but she wanted to wait until she find out how she would do on this medication.     Diet: mostly eats healthy diet - low fat, low salt   Exercise: no scheduled activity but active around the house: motivated to start regular walking with some chair exercise total of 150 min per week  Family History: sister - heart problem,hypertension  breast cancer, Mother- hypertension (in her 96's) other sister- heart attack   Social History:  Alcohol: none Smoking: never  Labs: Lipid Panel     Component Value Date/Time   CHOL 235 (H) 05/21/2022 1114   CHOL 237 (H) 06/22/2013 1405   TRIG 146 05/21/2022 1114   TRIG 103 06/22/2013 1405   HDL 70 05/21/2022 1114   HDL 74 06/22/2013 1405   CHOLHDL 3.4 05/21/2022 1114   CHOLHDL 3.2 07/28/2016 1151   VLDL 22 07/28/2016 1151   [place] 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   Lower Inner Quadrant   Irregular heartbeat    Malignant neoplasm of breast (female), unspecified site    Migraine    Numbness of arm    Right Arm   Osteopenia    Osteoporosis    Personal history of chemotherapy 2015   Right Breast Cancer   Personal history of radiation therapy 2015   Right Breast Cancer   Prediabetes    Pure hypercholesterolemia    SVT (supraventricular tachycardia) (H[facility])     Current Outpatient Medications on File Prior to Visit  Medication Sig Dispense Refill   albuterol (VENTOLIN HFA) 108 (90 Base) MCG/ACT inhaler Inhale 2 puffs into the lungs every 6 (six) hours as needed for wheezing or shortness of breath. 8 g 2   amLODipine (NORVASC) 5 MG tablet Take 1 tablet (5 mg total) by mouth daily. 90 tablet 3   aspirin EC 81 MG tablet Take 81 mg by mouth daily.     Calcium Carbonate-Vitamin D (CALTRATE 600+D PO) Take by mouth.     cetirizine (ZYRTEC) 10 MG tablet Take 10 mg  by mouth daily.     Cholecalciferol (VITAMIN D3) 25 MCG (1000 UT) CAPS Take 1 capsule by mouth daily.     esomeprazole (NEXIUM) 40 MG capsule Take 1 capsule (40 mg total) by mouth daily at 12 noon. 90 capsule 3   ezetimibe (ZETIA) 10 MG tablet Take 1 tablet (10 mg total) by mouth daily. 90 tablet 3   flecainide (TAMBOCOR) 100 MG tablet Take 1/2 (one-half) tablet by mouth twice daily 90 tablet 3   fluticasone (FLONASE) 50 MCG/ACT nasal spray Place 2 sprays into both nostrils daily. 16 g 3   hyoscyamine (LEVSIN SL) 0.125 MG SL tablet Place 1 tablet (0.125 mg total) under the tongue every 4 (four) hours as needed (Abdominal Pain, spasm, IBS). 60 tablet 4   ibuprofen (ADVIL,MOTRIN) 200 MG tablet Take 200 mg by mouth every 6 (six) hours as needed for headache or moderate pain.     Meth-Hyo-M Bl-Na Phos-Ph Sal (URIBEL) 118 MG CAPS Take 118 mg by mouth as needed (for bladder). Reported on 09/09/2015  2   metoprolol succinate  (TOPROL-XL) 25 MG 24 hr tablet Take 1 tablet by mouth once daily 90 tablet 0   metoprolol succinate (TOPROL-XL) 25 MG 24 hr tablet Take 1 tablet (25 mg total) by mouth daily. 90 tablet 3   metoprolol tartrate (LOPRESSOR) 25 MG tablet Take 1 tablet (25 mg) as needed for palpitations 30 tablet 11   PARoxetine (PAXIL) 20 MG tablet TAKE 1 TABLET BY MOUTH ONCE DAILY . APPOINTMENT REQUIRED FOR FUTURE REFILLS 90 tablet 3   rosuvastatin (CRESTOR) 10 MG tablet Take 1 tablet (10 mg total) by mouth daily. 90 tablet 3   No current facility-administered medications on file prior to visit.    Allergies  Allergen Reactions   Latex Swelling and Rash    Skin swelling    Sulfonamide Derivatives Anaphylaxis, Shortness Of Breath, Nausea Only and Other (See Comments)    Sweating, headache    Clarithromycin Nausea Only and Other (See Comments)    Headache    Crestor [Rosuvastatin] Other (See Comments)    Muscle aches, pain, weakness in lower extremities   Statins Other (See Comments)    Muscle soreness and weakness w/ Rosuvastatin and Zetia.   Ciprofloxacin Hcl Nausea Only, Palpitations and Other (See Comments)    Headache    BP Readings from Last 3 Encounters:  08/20/21 131/65  07/30/21 (!) 162/82  06/08/21 (!) 149/66    Problem  Hyperlipidemia   Current Medications: none Intolerances: muscle weakness,(unable to perform daily activity) - rosuvastatin 5 mg ezetimibe 10 mg-, pitavastatin 2 mg  Elevated LFT's  and muscle weakness - rosuvastatin 10 mg  Risk Factors: hypertension, 10 years ASCVD risk ~17% LDL goal: <100 mg/dl recent LDLc 139 (05/2022)     Hyperlipidemia Assessment:  LDL goal: < 100 mg/dl last LDLc 139 mg/dl (05/2022) Could not tolerate re-challenged ezetimibe  Intolerance to moderate intensity  and low intensity statins  Discussed next potential options (PCSK-9 inhibitors, bempedoic acid and inclisiran); cost, dosing efficacy, side effects  Patient is willing to try injectable  treatment and prefers PCSK9i    Plan: Stop taking ezetimibe  Will apply for PA for PCSK9i; will inform patient upon approval  Follow up FLP in 2-3 months of starting PCSK9i    Thank you,  Cammy Copa, Pharm.D [facility] [address] [place], [place] [address]  Phone: [phone]; Fax: [phone]

## 2022-07-23 NOTE — Assessment & Plan Note (Signed)
Assessment:  LDL goal: < 100 mg/dl last LDLc 139 mg/dl (05/2022) Could not tolerate re-challenged ezetimibe  Intolerance to moderate intensity  and low intensity statins  Discussed next potential options (PCSK-9 inhibitors, bempedoic acid and inclisiran); cost, dosing efficacy, side effects  Patient is willing to try injectable treatment and prefers PCSK9i    Plan: Stop taking ezetimibe  Will apply for PA for PCSK9i; will inform patient upon approval  Follow up FLP in 2-3 months of starting [facility]

## 2022-07-23 NOTE — Patient Instructions (Signed)
   We will start the process to get Praluent covered by your insurance.  Once the prior authorization is complete, we will call you to let you know and confirm pharmacy information.      Praluent is a cholesterol medication that improved your body's ability to get rid of "bad cholesterol" known as LDL. It can lower your LDL up to 60%. It is an injection that is given under the skin every 2 weeks. The most common side effects of Praluent include runny nose, symptoms of the common cold, rarely flu or flu-like symptoms, back/muscle pain in about 3-4% of the patients, and redness, pain, or bruising at the injection site.     Lab orders: We want to repeat labs after 2-3 months.  We will send you a lab order to remind you once we get closer to that time.

## 2022-07-26 ENCOUNTER — Other Ambulatory Visit (HOSPITAL_COMMUNITY): Payer: Self-pay

## 2022-07-26 ENCOUNTER — Telehealth: Payer: Self-pay

## 2022-07-26 DIAGNOSIS — E7800 Pure hypercholesterolemia, unspecified: Secondary | ICD-10-CM

## 2022-07-26 MED ORDER — REPATHA SURECLICK 140 MG/ML ~~LOC~~ SOAJ: 140.0000 mg | SUBCUTANEOUS | 3 refills | Status: DC

## 2022-07-26 NOTE — Telephone Encounter (Addendum)
Call to inform Repatha PA has been approved. N/A LVM. 2nd call attempt   Manage to talk to patient inform her about the approval  Enrolled in the grant for co-pay assistance   Patient will be going for lipid lab in 2-3 months

## 2022-07-26 NOTE — Addendum Note (Signed)
Addended by: Anda Latina on: 07/26/2022 01:55 PM   Modules accepted: Orders

## 2022-07-26 NOTE — Telephone Encounter (Addendum)
Pharmacy Patient Advocate Encounter  Prior Authorization for REPATHA 140 MG/ML INJ has been approved.    PA# [id number] Effective dates: 07/05/22 through 07/05/23   Received notification from [facility] that prior authorization for REPATHA 140 MG/ML INJ is needed.    PA submitted on 07/26/22 Key [id number] Status is pending  Karie Soda, CPhT Pharmacy Patient Advocate Specialist Direct Number: [phone] Fax: [phone]

## 2022-07-26 NOTE — Addendum Note (Signed)
Addended by: Anda Latina on: 07/26/2022 04:50 PM   Modules accepted: Orders

## 2022-07-28 NOTE — Telephone Encounter (Signed)
Patient called and LVM stating she did not get an email with grant information. Looks like it was sent to [employer]. VM requested it be sent to [email or web address]. Info emailed to that account.

## 2022-08-20 NOTE — Progress Notes (Signed)
Patient Care Team: Janie Morning, DO as PCP - General (Family Medicine) Troy Sine, MD as PCP - Cardiology (Cardiology) Terance Ice, MD (Inactive) (Cardiology) Carolan Clines, MD (Inactive) as Attending Physician (Urology) Azucena Fallen, MD as Consulting Physician (Obstetrics and Gynecology) Nicholas Lose, MD as Consulting Physician (Hematology and Oncology)  DIAGNOSIS: No diagnosis found.  SUMMARY OF ONCOLOGIC HISTORY: Oncology History  Breast cancer of lower-inner quadrant of right female breast ([place])  01/18/2014 Mammogram   U/S Breast: 7 x 7 x 9 mm irregular taller than wide hypoechoic mass with a peripheral echogenic rim in the right breast 4 o'clock position 7 cm from the nipple. No right axillary lymphadenopathy.    02/14/2014 Initial Diagnosis   Breast cancer-right Er 100%, PR 94%, Ki 67: 17%; Her2 Neg ratio 1.74; T1bN0Mo (Clinical Stage 1A)   04/02/2014 Surgery   Right breast lumpectomy: Invasive ductal carcinoma negative for lymph vascular invasion, 1.6 cm, grade 2, 3 SLN negative, reexcision margins benign, ER 100%, PR 94%, HER-2 negative Ki-67 70% T1 C. N0 M0 stage IA: Oncotype DX 29 (19%ROR)   05/07/2014 - 07/09/2014 Chemotherapy   Taxotere Cytoxan adjuvant chemotherapy every 3 weeks x4 cycles (after cycle one hospitalization for pain related to Neulasta and severe gastritis due to steroids)   08/07/2014 - 09/16/2014 Radiation Therapy   Adjuvant radiation therapy   10/04/2014 -  Anti-estrogen oral therapy   Anastrozole 1 mg daily changed to letrozole 09/16/2015 due to hot flashes switched to exemestane 07/02/18 due to myalgias switched back to letrozole 08/02/18 due to severe joint stiffness     CHIEF COMPLIANT:   INTERVAL HISTORY: Tami [name] is a   ALLERGIES:  is allergic to latex, sulfonamide derivatives, clarithromycin, crestor [rosuvastatin], statins, and ciprofloxacin hcl.  MEDICATIONS:  Current Outpatient Medications  Medication Sig Dispense  Refill   albuterol (VENTOLIN HFA) 108 (90 Base) MCG/ACT inhaler Inhale 2 puffs into the lungs every 6 (six) hours as needed for wheezing or shortness of breath. 8 g 2   amLODipine (NORVASC) 5 MG tablet Take 1 tablet (5 mg total) by mouth daily. 90 tablet 3   aspirin EC 81 MG tablet Take 81 mg by mouth daily.     Calcium Carbonate-Vitamin D (CALTRATE 600+D PO) Take by mouth.     cetirizine (ZYRTEC) 10 MG tablet Take 10 mg by mouth daily.     Cholecalciferol (VITAMIN D3) 25 MCG (1000 UT) CAPS Take 1 capsule by mouth daily.     esomeprazole (NEXIUM) 40 MG capsule Take 1 capsule (40 mg total) by mouth daily at 12 noon. 90 capsule 3   Evolocumab (REPATHA SURECLICK) [id number] MG/ML SOAJ Inject 140 mg into the skin every 14 (fourteen) days. 6 mL 3   ezetimibe (ZETIA) 10 MG tablet Take 1 tablet (10 mg total) by mouth daily. 90 tablet 3   flecainide (TAMBOCOR) 100 MG tablet Take 1/2 (one-half) tablet by mouth twice daily 90 tablet 3   fluticasone (FLONASE) 50 MCG/ACT nasal spray Place 2 sprays into both nostrils daily. 16 g 3   hyoscyamine (LEVSIN SL) 0.125 MG SL tablet Place 1 tablet (0.125 mg total) under the tongue every 4 (four) hours as needed (Abdominal Pain, spasm, IBS). 60 tablet 4   ibuprofen (ADVIL,MOTRIN) 200 MG tablet Take 200 mg by mouth every 6 (six) hours as needed for headache or moderate pain.     Meth-Hyo-M Bl-Na Phos-Ph Sal (URIBEL) 118 MG CAPS Take 118 mg by mouth as needed (for bladder). Reported on 09/09/2015  2   metoprolol succinate (TOPROL-XL) 25 MG 24 hr tablet Take 1 tablet by mouth once daily 90 tablet 0   metoprolol succinate (TOPROL-XL) 25 MG 24 hr tablet Take 1 tablet (25 mg total) by mouth daily. 90 tablet 3   metoprolol tartrate (LOPRESSOR) 25 MG tablet Take 1 tablet (25 mg) as needed for palpitations 30 tablet 11   PARoxetine (PAXIL) 20 MG tablet TAKE 1 TABLET BY MOUTH ONCE DAILY . APPOINTMENT REQUIRED FOR FUTURE REFILLS 90 tablet 3   rosuvastatin (CRESTOR) 10 MG tablet Take 1  tablet (10 mg total) by mouth daily. 90 tablet 3   No current facility-administered medications for this visit.    PHYSICAL EXAMINATION: ECOG PERFORMANCE STATUS: {CHL ONC ECOG PS:[phone]}  There were no vitals filed for this visit. There were no vitals filed for this visit.  BREAST:*** No palpable masses or nodules in either right or left breasts. No palpable axillary supraclavicular or infraclavicular adenopathy no breast tenderness or nipple discharge. (exam performed in the presence of a chaperone)  LABORATORY DATA:  I have reviewed the data as listed    Latest Ref Rng & Units 04/08/2022   12:38 PM 05/20/2021   10:36 AM 12/05/2020   10:24 AM  CMP  Glucose 70 - 99 mg/dL  97  100   BUN 8 - 27 mg/dL  9  8   Creatinine 0.57 - 1.00 mg/dL  1.01  0.89   Sodium 134 - 144 mmol/L  141  143   Potassium 3.5 - 5.2 mmol/L  4.3  4.1   Chloride 96 - 106 mmol/L  104  105   CO2 20 - 29 mmol/L  26  24   Calcium 8.7 - 10.3 mg/dL  9.6  9.7   Total Protein 6.0 - 8.5 g/dL 7.2  7.2  7.1   Total Bilirubin 0.0 - 1.2 mg/dL 0.4  0.6  0.3   Alkaline Phos 44 - 121 IU/L 91  114  112   AST 0 - 40 IU/L 27  22  21   $ ALT 0 - 32 IU/L 24  18  19     $ Lab Results  Component Value Date   WBC 4.0 05/20/2021   HGB 12.3 05/20/2021   HCT 36.3 05/20/2021   MCV 92 05/20/2021   PLT 225 05/20/2021   NEUTROABS 2.5 05/09/2017    ASSESSMENT & PLAN:  No problem-specific Assessment & Plan notes found for this encounter.    No orders of the defined types were placed in this encounter.  The patient has a good understanding of the overall plan. she agrees with it. she will call with any problems that may develop before the next visit here. Total time spent: 30 mins including face to face time and time spent for planning, charting and co-ordination of care   Suzzette Righter, [place] 08/20/22    I Gardiner Coins am acting as a Education administrator for [employer]  ***

## 2022-08-23 ENCOUNTER — Other Ambulatory Visit: Payer: Self-pay

## 2022-08-23 ENCOUNTER — Inpatient Hospital Stay: Payer: Medicare PPO | Attending: Hematology and Oncology | Admitting: Hematology and Oncology

## 2022-08-23 VITALS — BP 144/92 | HR 62 | Temp 97.7°F | Resp 16 | Ht 64.0 in | Wt 157.5 lb

## 2022-08-23 DIAGNOSIS — Z79811 Long term (current) use of aromatase inhibitors: Secondary | ICD-10-CM | POA: Diagnosis not present

## 2022-08-23 DIAGNOSIS — Z923 Personal history of irradiation: Secondary | ICD-10-CM | POA: Diagnosis not present

## 2022-08-23 DIAGNOSIS — Z170 Estrogen receptor positive status [ER+]: Secondary | ICD-10-CM | POA: Diagnosis not present

## 2022-08-23 DIAGNOSIS — Z79899 Other long term (current) drug therapy: Secondary | ICD-10-CM | POA: Diagnosis not present

## 2022-08-23 DIAGNOSIS — C50311 Malignant neoplasm of lower-inner quadrant of right female breast: Secondary | ICD-10-CM | POA: Insufficient documentation

## 2022-08-23 DIAGNOSIS — Z9221 Personal history of antineoplastic chemotherapy: Secondary | ICD-10-CM | POA: Insufficient documentation

## 2022-08-23 DIAGNOSIS — Z7982 Long term (current) use of aspirin: Secondary | ICD-10-CM | POA: Insufficient documentation

## 2022-08-23 NOTE — Assessment & Plan Note (Signed)
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 status post lumpectomy, Oncotype DX recurrence score 29 intermediate risk 19% risk of recurrence with hormonal therapy alone   Chemotherapy summary: Taxotere and Cytoxan 4 started 05/07/2014 completed 07/09/2014 Long-term chemotherapy toxicities: Neuropathy Radiation therapy: Started 08/07/2014 completed 09/16/14 ----------------------------------------------------------------------------  Current treatment: Anastrozole 1 mg daily started 10/04/14 switched to letrozole 09/16/2015, switched to exemestane 06/30/2018 Patient took a 11-monthbreak from antiestrogen therapy and she resumed 06/30/2018.  We tried a half a tablet but because of breaking unevenly she decided to stay on the 1 tablet a day.  Discontinued February 2022    Breast Cancer Surveillance: 1. Breast exam 08/23/2022: Benign, extremely tender to palpation but no lumps or nodules. 2. Mammogram 07/24/2021: Benign. Postsurgical changes. Breast Density Category B.  Patient will need a new mammogram this year   Left chest wall tenderness: There is a sharp pain when we pressed on the left chest wall posteriorly to the breast which is probably related to a rib fracture.   Return to clinic in 1 year for follow-up

## 2022-09-06 ENCOUNTER — Other Ambulatory Visit: Payer: Self-pay | Admitting: Hematology and Oncology

## 2022-09-06 DIAGNOSIS — Z1231 Encounter for screening mammogram for malignant neoplasm of breast: Secondary | ICD-10-CM

## 2022-09-09 DIAGNOSIS — E7800 Pure hypercholesterolemia, unspecified: Secondary | ICD-10-CM | POA: Diagnosis not present

## 2022-09-09 DIAGNOSIS — R946 Abnormal results of thyroid function studies: Secondary | ICD-10-CM | POA: Diagnosis not present

## 2022-09-09 DIAGNOSIS — Z0000 Encounter for general adult medical examination without abnormal findings: Secondary | ICD-10-CM | POA: Diagnosis not present

## 2022-09-09 DIAGNOSIS — I480 Paroxysmal atrial fibrillation: Secondary | ICD-10-CM | POA: Diagnosis not present

## 2022-09-09 DIAGNOSIS — R7303 Prediabetes: Secondary | ICD-10-CM | POA: Diagnosis not present

## 2022-09-10 ENCOUNTER — Ambulatory Visit
Admission: RE | Admit: 2022-09-10 | Discharge: 2022-09-10 | Disposition: A | Discharge status: Home / Self Care | Payer: Medicare PPO | Source: Ambulatory Visit | Attending: Hematology and Oncology | Admitting: Hematology and Oncology

## 2022-09-10 DIAGNOSIS — C50311 Malignant neoplasm of lower-inner quadrant of right female breast: Secondary | ICD-10-CM | POA: Diagnosis not present

## 2022-09-10 DIAGNOSIS — Z1231 Encounter for screening mammogram for malignant neoplasm of breast: Secondary | ICD-10-CM

## 2022-09-16 DIAGNOSIS — M5136 Other intervertebral disc degeneration, lumbar region: Secondary | ICD-10-CM | POA: Diagnosis not present

## 2022-09-16 DIAGNOSIS — Z0000 Encounter for general adult medical examination without abnormal findings: Secondary | ICD-10-CM | POA: Diagnosis not present

## 2022-09-16 DIAGNOSIS — Z23 Encounter for immunization: Secondary | ICD-10-CM | POA: Diagnosis not present

## 2022-09-16 DIAGNOSIS — J45909 Unspecified asthma, uncomplicated: Secondary | ICD-10-CM | POA: Diagnosis not present

## 2022-09-16 DIAGNOSIS — Z853 Personal history of malignant neoplasm of breast: Secondary | ICD-10-CM | POA: Diagnosis not present

## 2022-09-27 ENCOUNTER — Telehealth: Payer: Self-pay | Admitting: Cardiovascular Disease

## 2022-09-27 ENCOUNTER — Other Ambulatory Visit: Payer: Self-pay

## 2022-09-27 MED ORDER — METOPROLOL TARTRATE 25 MG PO TABS: ORAL_TABLET | ORAL | 11 refills | Status: DC

## 2022-09-27 NOTE — Telephone Encounter (Signed)
*  STAT* If patient is at the pharmacy, call can be transferred to refill team.   1. Which medications need to be refilled? (please list name of each medication and dose if known) metoprolol tartrate (LOPRESSOR) 25 MG tablet   2. Which pharmacy/location (including street and city if local pharmacy) is medication to be sent to?  [place], [address]    3. Do they need a 30 day or 90 day supply? 30 day  Pt states she is completely out of this medication.

## 2022-09-27 NOTE — Telephone Encounter (Signed)
Called patient to advise medication refill sent to pharmacy °

## 2022-09-28 DIAGNOSIS — C50311 Malignant neoplasm of lower-inner quadrant of right female breast: Secondary | ICD-10-CM | POA: Diagnosis not present

## 2022-09-29 ENCOUNTER — Telehealth: Payer: Self-pay | Admitting: Cardiovascular Disease

## 2022-09-29 DIAGNOSIS — I480 Paroxysmal atrial fibrillation: Secondary | ICD-10-CM

## 2022-09-29 MED ORDER — METOPROLOL TARTRATE 25 MG PO TABS: ORAL_TABLET | ORAL | 11 refills | Status: DC

## 2022-09-29 NOTE — Telephone Encounter (Signed)
Rx resent to pharmacy on file. Patient is aware.

## 2022-09-29 NOTE — Telephone Encounter (Signed)
  Pt c/o medication issue:  1. Name of Medication: metoprolol tartrate (LOPRESSOR) 25 MG tablet   2. How are you currently taking this medication (dosage and times per day)?Take 1 tablet (25 mg) as needed for palpitations   3. Are you having a reaction (difficulty breathing--STAT)? No   4. What is your medication issue? Pt said, per pharmacy they still didn't receive refill. Advised it was sent out on the 25th but pharmacy told her to request to resend refill

## 2022-09-30 ENCOUNTER — Other Ambulatory Visit: Payer: Self-pay | Admitting: *Deleted

## 2022-09-30 MED ORDER — METOPROLOL TARTRATE 25 MG PO TABS: ORAL_TABLET | ORAL | 3 refills | Status: DC

## 2022-10-04 ENCOUNTER — Other Ambulatory Visit: Payer: Self-pay | Admitting: Physician Assistant

## 2022-11-08 ENCOUNTER — Ambulatory Visit: Payer: Medicare PPO | Admitting: Cardiovascular Disease

## 2022-11-09 NOTE — Progress Notes (Signed)
Cardiology Clinic Note   Patient Name: Tami [name] Date of Encounter: 11/11/2022  Primary Care Provider:  Irena Reichmann, DO Primary Cardiologist:  Nicki Guadalajara, MD  Patient Profile    Tami [name] 72 year old female presents the clinic today for follow-up evaluation of her paroxysmal atrial fibrillation and hypertension.  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   Lower Inner Quadrant   Irregular heartbeat    Malignant neoplasm of breast (female), unspecified site    Migraine    Numbness of arm    Right Arm   Osteopenia    Osteoporosis    Personal history of chemotherapy 2015   Right Breast Cancer   Personal history of radiation therapy 2015   Right Breast Cancer   Prediabetes    Pure hypercholesterolemia    SVT (supraventricular tachycardia)    Past Surgical History:  Procedure Laterality Date   30 Day Monitor  04/10/2009 to 05/23/2009   No End Of Summary Report Atrial Fib,Narrow Complex Tachycardia,Sinus Rhythm    ABDOMINAL HYSTERECTOMY  1987   BREAST EXCISIONAL BIOPSY Left    BREAST LUMPECTOMY Right 2015   BREAST LUMPECTOMY WITH NEEDLE LOCALIZATION AND AXILLARY SENTINEL LYMPH NODE BX Right 04/02/2014   Procedure: Right Axillary Lymphatic Mapping; Right axillary Sentinal Node Biopsy; Right Partial Mastectomy after wire localization;  Surgeon: Avel Peace, MD;  Location: [facility] OR;  Service: General;  Laterality: Right;   BREAST SURGERY     Left   CYSTOSCOPY  04/25/2012   Pt may be a candidate for research  anti-incontinence procedure protocol   Dipyridamole Myoview  05/27/2009   The post stress myocardial perfusion images show a normal pattern of perfusion in all regions.The post stress ejection fraction is 83 %. Normal  myocardial perfusion imaging This is a low risk scan.   DOPPLER ECHOCARDIOGRAPHY  08/21/2010   Ejecion Fraction =>55% All chambers are normal in size and function No significant change from 2010.   lower Venous Dopplers  04/11/2009   Normal bilateral lower ext venous duplex   PORTACATH PLACEMENT N/A 04/26/2014   Procedure: ULTRASOUND GUIDED PORT A CATHE INSERTION;  Surgeon: Avel Peace, MD;  Location: [facility] ORS;  Service: General;  Laterality: N/A;   Upper Arterial Dopplers  06/02/2011   Right Side PPG Demonstrates siginificantly diminshed waveforms with the Adson's maneuver which may suggest Thoracic Outlet Syndrone. This is a abnormal thoracic outlet arterial evaluation.    Allergies  Allergies  Allergen Reactions   Latex Swelling and Rash    Skin swelling    Sulfonamide Derivatives Anaphylaxis, Shortness Of Breath, Nausea Only and Other (See Comments)    Sweating, headache    Clarithromycin Nausea Only and Other (See Comments)    Headache    Crestor [Rosuvastatin] Other (See Comments)    Muscle aches, pain, weakness in lower extremities   Statins Other (See Comments)  Muscle soreness and weakness w/ Rosuvastatin and Zetia.   Ciprofloxacin Hcl Nausea Only, Palpitations and Other (See Comments)    Headache     History of Present Illness    Tami [name] has a PMH of paroxysmal atrial fibrillation, paroxysmal SVT, HTN, first-degree AV block, esophageal candidiasis, GERD, IBS, degenerative disc disease, HLD, and breast cancer.  She is a former patient of Dr. Alanda Amass.  Her echocardiogram 2/12 showed normal systolic function and diastolic function.  She was not noted to have significant valvular disease.  Her nuclear stress test 2010 was normal.  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  It was staged as 1A without lymph node involvement and measured 9 mm in size.  It was estrogen receptor positive, progesterone receptors positive.  She underwent lumpectomy, radiation, and chemotherapy.  Her follow-up echocardiogram 03/28/2014 showed an EF of 60-65%.  She was seen by Blima Rich, PA-C 11/20 and remained stable from a cardiac standpoint.  She did have an isolated episode of palpitations 1 month prior when she had forgotten to take her medication.  She underwent coronary CTA 06/08/2021.  Her calcium score was noted to be 0.  There was no evidence of coronary plaque.    She was seen last by Dr. Tresa Endo on 07/30/2021.  During that time she noted recurrent chest wall discomfort.  She reported feeling tired.  Her blood pressure was well-controlled.  She had been going to bed and waking up at least 3 times through the night and then getting up around 10 AM.  Her sleep was not restful.  She underwent repeat sleep study 08/25/2021.  She was noted to have AHI of 49.9/h.  Her mean heart rate was 55.4 bpm.  She was noted to have severe obstructive sleep apnea.  CPAP was recommended.  She presents to the clinic today for follow-up evaluation and states she did not know she had been diagnosed with sleep apnea.  We reviewed her sleep study from 08/25/2021.  She expressed understanding.  She wishes to try oral device before CPAP.  She notes that her husband has CPAP and it is a lot of work to keep clean.  Her blood pressure is well controlled today at 128/70.  Her EKG shows sinus bradycardia with first-degree AV block 56 bpm.  I will continue her current medication regimen and plan follow-up in 9 to 12 months.  Today she denies chest pain, shortness of breath, lower extremity edema, fatigue, palpitations, melena, hematuria, hemoptysis, diaphoresis, weakness, presyncope, syncope, orthopnea, and PND.    Home Medications    Prior to Admission  medications   Medication Sig Start Date End Date Taking? Authorizing Provider  albuterol (VENTOLIN HFA) 108 (90 Base) MCG/ACT inhaler Inhale 2 puffs into the lungs every 6 (six) hours as needed for wheezing or shortness of breath. Patient not taking: Reported on 08/23/2022 05/05/20   Hall-Potvin, [place], PA-C  amLODipine (NORVASC) 5 MG tablet Take 1 tablet (5 mg total) by mouth daily. 05/18/21 08/16/21  Lennette Bihari, MD  aspirin EC 81 MG tablet Take 81 mg by mouth daily. Patient not taking: Reported on 08/23/2022    [provider]  Calcium Carbonate-Vitamin D (CALTRATE 600+D PO) Take by mouth. Patient not taking: Reported on 08/23/2022    [provider]  cetirizine (ZYRTEC) 10 MG tablet Take 10 mg by mouth daily. Patient not taking: Reported on 08/23/2022    [provider]  Cholecalciferol (VITAMIN D3) 25 MCG (  1000 UT) CAPS Take 1 capsule by mouth daily. Patient not taking: Reported on 08/23/2022    [provider]  esomeprazole (NEXIUM) 40 MG capsule Take 1 capsule (40 mg total) by mouth daily at 12 noon. Patient not taking: Reported on 08/23/2022 11/11/20   Hilarie Fredrickson, MD  flecainide [facility]) 100 MG tablet Take 1/2 (one-half) tablet by mouth twice daily Patient not taking: Reported on 08/23/2022 05/14/22   Lennette Bihari, MD  fluticasone [facility]) 50 MCG/ACT nasal spray Place 2 sprays into both nostrils daily. Patient not taking: Reported on 08/23/2022 12/12/17   Benjiman Core D, PA-C  hyoscyamine (LEVSIN SL) 0.125 MG SL tablet Place 1 tablet (0.125 mg total) under the tongue every 4 (four) hours as needed (Abdominal Pain, spasm, IBS). Patient not taking: Reported on 08/23/2022 05/26/20   Esterwood, Amy S, PA-C  ibuprofen (ADVIL,MOTRIN) 200 MG tablet Take 200 mg by mouth every 6 (six) hours as needed for headache or moderate pain. Patient not taking: Reported on 08/23/2022    [provider]  Meth-Hyo-M Bl-Na Phos-Ph Sal (URIBEL) 118 MG CAPS  Take 118 mg by mouth as needed (for bladder). Reported on 09/09/2015 Patient not taking: Reported on 08/23/2022 03/22/14   [provider]  methocarbamol (ROBAXIN) 750 MG tablet Take 750 mg by mouth 4 (four) times daily.    [provider]  metoprolol succinate (TOPROL-XL) 25 MG 24 hr tablet Take 1 tablet by mouth once daily Patient not taking: Reported on 08/23/2022 12/14/21   Lennette Bihari, MD  metoprolol succinate (TOPROL-XL) 25 MG 24 hr tablet Take 1 tablet (25 mg total) by mouth daily. Patient not taking: Reported on 08/23/2022 12/14/21   Lennette Bihari, MD  metoprolol tartrate (LOPRESSOR) 25 MG tablet Take 1 tablet (25 mg) as needed for palpitations 09/30/22   Lennette Bihari, MD  PARoxetine (PAXIL) 20 MG tablet TAKE 1 TABLET BY MOUTH ONCE DAILY . APPOINTMENT REQUIRED FOR FUTURE REFILLS Patient not taking: Reported on 08/23/2022 11/09/21   Serena Croissant, MD    Family History    Family History  Problem Relation Age of Onset   Skin cancer Mother    Diabetes Sister        Borderline   Hypertension Sister    Heart attack Sister    Breast cancer Sister 22       maternal half-sister; deceased 57   Diabetes Brother        Borderline; maternal half-brother   Stomach cancer Maternal Aunt    Throat cancer Maternal Aunt        stomach and throat; heavy smoker and drinker; deceased 56   Cancer Cousin        breast/ovarian; daughter of mat cousin with unk. primary at 69; pt's 1st cousin-once-removed   Other Father        accident   Anemia Daughter        IDA   Colon cancer Neg Hx    Esophageal cancer Neg Hx    Rectal cancer Neg Hx    She indicated that her mother is deceased. She indicated that her father is deceased. She indicated that her sister is deceased. She indicated that her brother is alive. She indicated that her maternal grandmother is deceased. She indicated that both of her daughters are alive. She indicated that her maternal aunt is deceased. She indicated that  her cousin is alive. She indicated that the status of her neg hx is unknown.  Social History  Social History   Socioeconomic History   Marital status: Married    Spouse name: Chrissie Noa    Number of children: 2   Years of education: 16   Highest education level: Bachelor's degree (e.g., BA, AB, BS)  Occupational History   Occupation: Retired  Tobacco Use   Smoking status: Never   Smokeless tobacco: Never  Vaping Use   Vaping Use: Never used  Substance and Sexual Activity   Alcohol use: No   Drug use: No   Sexual activity: Yes    Partners: Male    Comment: number of sex partners in the last 12 months 1  Other Topics Concern   Not on file  Social History Narrative   Exercise: walks on treadmill three times a week for 30-45 minutes at a time      Lives at home with husband.   2 cups caffeine daily.   Right-handed.      Social Determinants of Health   Financial Resource Strain: Not on file  Food Insecurity: Not on file  Transportation Needs: Not on file  Physical Activity: Not on file  Stress: Not on file  Social Connections: Not on file  Intimate Partner Violence: Not on file     Review of Systems    General:  No chills, fever, night sweats or weight changes.  Cardiovascular:  No chest pain, dyspnea on exertion, edema, orthopnea, palpitations, paroxysmal nocturnal dyspnea. Dermatological: No rash, lesions/masses Respiratory: No cough, dyspnea Urologic: No hematuria, dysuria Abdominal:   No nausea, vomiting, diarrhea, bright red blood per rectum, melena, or hematemesis Neurologic:  No visual changes, wkns, changes in mental status. All other systems reviewed and are otherwise negative except as noted above.  Physical Exam    VS:  BP 128/70   Pulse (!) 56   Ht 5\' 2"  (1.575 m)   Wt 159 lb 3.2 oz (72.2 kg)   LMP 07/06/1985 (Approximate)   SpO2 99%   BMI 29.12 kg/m  , BMI Body mass index is 29.12 kg/m. GEN: Well nourished, well developed, in no acute  distress. HEENT: normal. Neck: Supple, no JVD, carotid bruits, or masses. Cardiac: RRR, no murmurs, rubs, or gallops. No clubbing, cyanosis, edema.  Radials/DP/PT 2+ and equal bilaterally.  Respiratory:  Respirations regular and unlabored, clear to auscultation bilaterally. GI: Soft, nontender, nondistended, BS + x 4. MS: no deformity or atrophy. Skin: warm and dry, no rash. Neuro:  Strength and sensation are intact. Psych: Normal affect.  Accessory Clinical Findings    Recent Labs: 04/08/2022: ALT 24   Recent Lipid Panel    Component Value Date/Time   CHOL 235 (H) 05/21/2022 1114   CHOL 237 (H) 06/22/2013 1405   TRIG 146 05/21/2022 1114   TRIG 103 06/22/2013 1405   HDL 70 05/21/2022 1114   HDL 74 06/22/2013 1405   CHOLHDL 3.4 05/21/2022 1114   CHOLHDL 3.2 07/28/2016 1151   VLDL 22 07/28/2016 1151   LDLCALC 139 (H) 05/21/2022 1114   LDLCALC 142 (H) 06/22/2013 1405         ECG personally reviewed by me today-sinus bradycardia with first-degree AV block nonspecific T wave abnormality 56 bpm- No acute changes  Echocardiogram 03/28/2014  Study Conclusions   - Left ventricle: The cavity size was normal. Wall thickness was    normal. Systolic function was normal. The estimated ejection    fraction was in the range of 60% to 65%.   Transthoracic echocardiography.  M-mode, complete 2D, spectral  Doppler, and  color Doppler.  Birthdate:  Patient birthdate:  1950/07/25.  Age:  Patient is 72 yr old.  Sex:  Gender: female.  BMI: 25.9 kg/m^2.  Blood pressure:     142/76  Patient status:  Outpatient.  Study date:  Study date: 03/28/2014. Study time: 12:49  PM.  Location:  Echo laboratory.   -------------------------------------------------------------------   -------------------------------------------------------------------  Left ventricle:  The cavity size was normal. Wall thickness was  normal. Systolic function was normal. The estimated ejection  fraction was in the range  of 60% to 65%.   -------------------------------------------------------------------  Aortic valve:   Mildly thickened, mildly calcified leaflets.    -------------------------------------------------------------------  Mitral valve:   Structurally normal valve.   Leaflet separation was  normal.  Doppler:  Transvalvular velocity was within the normal  range. There was no evidence for stenosis. There was no significant  regurgitation.    Peak gradient (D): 2 mm Hg.   -------------------------------------------------------------------  Left atrium:  LA Volume/BSA= 26.4 ml/m2. The atrium was normal in  size.   -------------------------------------------------------------------  Right ventricle:  The cavity size was normal. Wall thickness was  normal. Systolic function was normal.   -------------------------------------------------------------------  Tricuspid valve:   Structurally normal valve.   Leaflet separation  was normal.  Doppler:  Transvalvular velocity was within the normal  range. There was trivial regurgitation.   -------------------------------------------------------------------  Systemic veins:  Inferior vena cava: The vessel was normal in size. The  respirophasic diameter changes were in the normal range (= 50%),  consistent with normal central venous pressure. Diameter: 10.3 mm.    Coronary CTA 06/08/2021  MEDICATIONS: Sub lingual nitro. 4mg  x 2   TECHNIQUE: The patient was scanned on a Siemens 192 slice scanner. Gantry rotation speed was 250 msecs. Collimation was 0.6 mm. A 100 kV prospective scan was triggered in the ascending thoracic aorta at 35-75% of the R-R interval. Average HR during the scan was 60 bpm. The 3D data set was interpreted on a dedicated work station using MPR, MIP and VRT modes. A total of 80cc of contrast was used.   FINDINGS: Non-cardiac: See separate report from [facility].   Pulmonary veins drain normally to the left  atrium. No LA appendage thrombus.   Calcium Score: 0 Agatston units.   Coronary Arteries: Right dominant with no anomalies   LM: No plaque or stenosis.   LAD system:  No plaque or stenosis.   Circumflex system: No plaque or stenosis.   RCA system:  No plaque or stenosis.   IMPRESSION: 1. Coronary artery calcium score 0 Agatston units, suggesting low risk for future cardiac events.   2.  No significant coronary artery disease noted.   Dalton Mclean     Electronically Signed   By: Marca Ancona M.D.   On: 06/08/2021 15:07  Assessment & Plan   1.  OSA-felt that her sleep study was normal.  Explained that she has several periods of apnea throughout the night according to her sleep study.  Reviewed recommendations for modification.  She wishes to contact her dentist to see about oral device for OSA.   Continue weight loss Avoid supine sleeping Continue sleep hygiene  Paroxysmal atrial fibrillation-heart rate today 56 bpm.  Denies recent episodes of accelerated or irregular heartbeat.   Continue metoprolol, flecainide, aspirin Avoid triggers caffeine, chocolate, EtOH, dehydration etc.  Essential hypertension-BP today 128/70. Continue amlodipine, metoprolol Low-sodium diet Increase physical activity as tolerated  Hyperlipidemia-LDL 139 on 05/1722. Continue aspirin High-fiber diet Follows with PCP  First-degree heart block-denies episodes of lightheadedness, presyncope and syncope.  Denies palpitations. Continue current medical therapy Continue to monitor  Disposition: Follow-up with Dr. Tresa Endo or me in 9-12 months.   Thomasene Ripple. Jennyfer Nickolson NP-C     11/11/2022, 3:27 PM [facility] [address] Office [phone] Fax [phone]    I spent 14 minutes examining this patient, reviewing medications, and using patient centered shared decision making involving her cardiac care.  Prior to her visit I spent greater than 20 minutes  reviewing her past medical history,  medications, and prior cardiac tests.

## 2022-11-10 DIAGNOSIS — Z124 Encounter for screening for malignant neoplasm of cervix: Secondary | ICD-10-CM | POA: Diagnosis not present

## 2022-11-10 DIAGNOSIS — N3289 Other specified disorders of bladder: Secondary | ICD-10-CM | POA: Diagnosis not present

## 2022-11-10 DIAGNOSIS — Z90711 Acquired absence of uterus with remaining cervical stump: Secondary | ICD-10-CM | POA: Diagnosis not present

## 2022-11-10 DIAGNOSIS — Z6828 Body mass index (BMI) 28.0-28.9, adult: Secondary | ICD-10-CM | POA: Diagnosis not present

## 2022-11-10 DIAGNOSIS — Z01411 Encounter for gynecological examination (general) (routine) with abnormal findings: Secondary | ICD-10-CM | POA: Diagnosis not present

## 2022-11-10 DIAGNOSIS — A609 Anogenital herpesviral infection, unspecified: Secondary | ICD-10-CM | POA: Diagnosis not present

## 2022-11-10 DIAGNOSIS — Z01419 Encounter for gynecological examination (general) (routine) without abnormal findings: Secondary | ICD-10-CM | POA: Diagnosis not present

## 2022-11-11 ENCOUNTER — Ambulatory Visit: Payer: Medicare PPO | Attending: Cardiovascular Disease | Admitting: General Practice

## 2022-11-11 ENCOUNTER — Encounter: Payer: Self-pay | Admitting: General Practice

## 2022-11-11 VITALS — BP 128/70 | HR 56 | Ht 62.0 in | Wt 159.2 lb

## 2022-11-11 DIAGNOSIS — E7800 Pure hypercholesterolemia, unspecified: Secondary | ICD-10-CM | POA: Diagnosis not present

## 2022-11-11 DIAGNOSIS — G4733 Obstructive sleep apnea (adult) (pediatric): Secondary | ICD-10-CM | POA: Diagnosis not present

## 2022-11-11 DIAGNOSIS — I480 Paroxysmal atrial fibrillation: Secondary | ICD-10-CM | POA: Diagnosis not present

## 2022-11-11 DIAGNOSIS — I440 Atrioventricular block, first degree: Secondary | ICD-10-CM

## 2022-11-11 DIAGNOSIS — I10 Essential (primary) hypertension: Secondary | ICD-10-CM

## 2022-11-11 NOTE — Patient Instructions (Signed)
Medication Instructions:  The current medical regimen is effective;  continue present plan and medications as directed. Please refer to the Current Medication list given to you today.  *If you need a refill on your cardiac medications before your next appointment, please call your pharmacy*  Lab Work: NONE If you have labs (blood work) drawn today and your tests are completely normal, you will receive your results only by:  MyChart Message (if you have MyChart) OR  A paper copy in the mail If you have any lab test that is abnormal or we need to change your treatment, we will call you to review the results.  Testing/Procedures: NONE   Follow-Up: At [facility], you and your health needs are our priority.  As part of our continuing mission to provide you with exceptional heart care, we have created designated Provider Care Teams.  These Care Teams include your primary Cardiologist (physician) and Advanced Practice Providers (APPs -  Physician Assistants and Nurse Practitioners) who all work together to provide you with the care you need, when you need it.  Your next appointment:   9-12 month(s)  Provider:   Nicki Guadalajara, MD     Other Instructions CONTACT DENTIST ABOUT SLEEP APPLIANCE

## 2022-11-12 DIAGNOSIS — C50311 Malignant neoplasm of lower-inner quadrant of right female breast: Secondary | ICD-10-CM | POA: Diagnosis not present

## 2022-11-16 ENCOUNTER — Telehealth: Payer: Self-pay | Admitting: Cardiovascular Disease

## 2022-11-16 NOTE — Telephone Encounter (Signed)
Pt states at her last appt she talked about trying an oral appliance, she would like a referral sent to Dr. Myrtis Ser  Phone number [phone]

## 2022-11-30 ENCOUNTER — Other Ambulatory Visit: Payer: Self-pay | Admitting: Hematology and Oncology

## 2022-11-30 DIAGNOSIS — F419 Anxiety disorder, unspecified: Secondary | ICD-10-CM

## 2022-12-14 ENCOUNTER — Telehealth: Payer: Self-pay

## 2022-12-14 ENCOUNTER — Telehealth: Payer: Self-pay | Admitting: Cardiovascular Disease

## 2022-12-14 NOTE — Telephone Encounter (Signed)
  Kamel from Dr. Argentina Ponder office calling to follow up the referral they need for the pt. Pt already have an appt to see Dr. Myrtis Ser on 12/16/22 at 11am. They need to receive RX, sleep study and all clinicals. She gave email address: [email or web address] and fax # [phone]

## 2022-12-14 NOTE — Telephone Encounter (Signed)
Notified patient that Rx and requested information was sent to Dr. Myrtis Ser office for a referral today 12/14/22. All questions (if any) were answered. Patient verbalized understanding.

## 2022-12-15 ENCOUNTER — Other Ambulatory Visit: Payer: Self-pay | Admitting: Hematology and Oncology

## 2022-12-15 DIAGNOSIS — F419 Anxiety disorder, unspecified: Secondary | ICD-10-CM

## 2022-12-16 NOTE — Telephone Encounter (Signed)
Zella Ball called to f/u on this request. She states they just need a copy of her sleep study

## 2023-01-04 ENCOUNTER — Other Ambulatory Visit: Payer: Self-pay | Admitting: Cardiovascular Disease

## 2023-01-05 NOTE — Telephone Encounter (Signed)
Notified patient that Rx and requested information was sent to Dr. [facility] office for a referral today 12/14/22. All questions (if any) were answered. Patient verbalized understanding. 

## 2023-02-24 ENCOUNTER — Ambulatory Visit: Payer: Medicare PPO | Admitting: Podiatry

## 2023-03-02 ENCOUNTER — Ambulatory Visit: Payer: Medicare PPO | Admitting: Podiatry

## 2023-03-02 ENCOUNTER — Ambulatory Visit (INDEPENDENT_AMBULATORY_CARE_PROVIDER_SITE_OTHER): Payer: BC Managed Care – PPO

## 2023-03-02 ENCOUNTER — Encounter: Payer: Self-pay | Admitting: Podiatry

## 2023-03-02 DIAGNOSIS — L84 Corns and callosities: Secondary | ICD-10-CM

## 2023-03-02 DIAGNOSIS — G5761 Lesion of plantar nerve, right lower limb: Secondary | ICD-10-CM

## 2023-03-02 NOTE — Progress Notes (Signed)
Subjective:   Patient ID: Tami [name], female   DOB: 72 y.o.   MRN: [id number]   HPI Patient presents with forefoot pain bilateral and also lesion formation plantar aspect both feet that can be painful with walking.  States the pains been present for a while the lesions for a longer period of time.  Patient does not smoke likes to be active   Review of Systems  All other systems reviewed and are negative.       Objective:  Physical Exam Vitals and nursing note reviewed.  Constitutional:      Appearance: She is well-developed.  Pulmonary:     Effort: Pulmonary effort is normal.  Musculoskeletal:        General: Normal range of motion.  Skin:    General: Skin is warm.  Neurological:     Mental Status: She is alert.     Neurovascular status intact muscle strength was adequate range of motion adequate.  Patient is found to have mild inflammation forefoot bilateral low-grade with possibility for little nerve irritation present.  Patient has lesion formation bilateral that is painful and diffuse plantar aspect both feet     Assessment:  Cannot rule out inflammatory condition neuroma symptomatology with chronic keratotic lesion formation     Plan:  H&P x-rays done reviewed I went ahead today and I debrided lesions no iatrogenic bleeding noted and I discussed wider shoe gear and stiff bottom shoes.  Reappoint as symptoms indicate  X-rays indicate no signs of advanced arthritis or stress fracture formation with condition

## 2023-03-03 DIAGNOSIS — G4733 Obstructive sleep apnea (adult) (pediatric): Secondary | ICD-10-CM | POA: Diagnosis not present

## 2023-03-09 DIAGNOSIS — Z23 Encounter for immunization: Secondary | ICD-10-CM | POA: Diagnosis not present

## 2023-03-09 DIAGNOSIS — M25562 Pain in left knee: Secondary | ICD-10-CM | POA: Diagnosis not present

## 2023-03-14 DIAGNOSIS — M25562 Pain in left knee: Secondary | ICD-10-CM | POA: Diagnosis not present

## 2023-03-22 DIAGNOSIS — M25562 Pain in left knee: Secondary | ICD-10-CM | POA: Diagnosis not present

## 2023-03-29 DIAGNOSIS — Z23 Encounter for immunization: Secondary | ICD-10-CM | POA: Diagnosis not present

## 2023-04-04 DIAGNOSIS — M25562 Pain in left knee: Secondary | ICD-10-CM | POA: Diagnosis not present

## 2023-04-07 DIAGNOSIS — H5212 Myopia, left eye: Secondary | ICD-10-CM | POA: Diagnosis not present

## 2023-04-07 DIAGNOSIS — Z961 Presence of intraocular lens: Secondary | ICD-10-CM | POA: Diagnosis not present

## 2023-04-07 DIAGNOSIS — H52203 Unspecified astigmatism, bilateral: Secondary | ICD-10-CM | POA: Diagnosis not present

## 2023-05-08 ENCOUNTER — Other Ambulatory Visit: Payer: Self-pay | Admitting: Cardiovascular Disease

## 2023-05-21 DIAGNOSIS — M1990 Unspecified osteoarthritis, unspecified site: Secondary | ICD-10-CM | POA: Diagnosis not present

## 2023-05-21 DIAGNOSIS — I471 Supraventricular tachycardia, unspecified: Secondary | ICD-10-CM | POA: Diagnosis not present

## 2023-05-21 DIAGNOSIS — I951 Orthostatic hypotension: Secondary | ICD-10-CM | POA: Diagnosis not present

## 2023-05-21 DIAGNOSIS — J45909 Unspecified asthma, uncomplicated: Secondary | ICD-10-CM | POA: Diagnosis not present

## 2023-05-21 DIAGNOSIS — I10 Essential (primary) hypertension: Secondary | ICD-10-CM | POA: Diagnosis not present

## 2023-05-21 DIAGNOSIS — K219 Gastro-esophageal reflux disease without esophagitis: Secondary | ICD-10-CM | POA: Diagnosis not present

## 2023-05-21 DIAGNOSIS — Z7982 Long term (current) use of aspirin: Secondary | ICD-10-CM | POA: Diagnosis not present

## 2023-05-21 DIAGNOSIS — F411 Generalized anxiety disorder: Secondary | ICD-10-CM | POA: Diagnosis not present

## 2023-05-21 DIAGNOSIS — R32 Unspecified urinary incontinence: Secondary | ICD-10-CM | POA: Diagnosis not present

## 2023-05-21 DIAGNOSIS — F4310 Post-traumatic stress disorder, unspecified: Secondary | ICD-10-CM | POA: Diagnosis not present

## 2023-05-21 DIAGNOSIS — E785 Hyperlipidemia, unspecified: Secondary | ICD-10-CM | POA: Diagnosis not present

## 2023-05-26 ENCOUNTER — Ambulatory Visit: Payer: BC Managed Care – PPO | Admitting: Podiatry

## 2023-06-17 ENCOUNTER — Other Ambulatory Visit: Payer: Self-pay | Admitting: Cardiovascular Disease

## 2023-06-17 ENCOUNTER — Other Ambulatory Visit: Payer: Self-pay | Admitting: Pharmacist

## 2023-06-28 ENCOUNTER — Telehealth: Payer: Self-pay | Admitting: Physician Assistant

## 2023-06-28 NOTE — Telephone Encounter (Signed)
   The patient called the answering service after-hours today (holiday hours). Delayed callback as this was originally not paged out correctly.  Pt reports her pharmacy told her yesterday they did not receive refill authorization from our office on her Repatha. EMSam showing it was sent in on 06/17/23 to confirmed [facility] from pharmacist under Dr. Tresa Endo for 140mg  q14 days #72mL with 3 refills. I called [facility] and they confirmed they have filled it for her and her copay is $80.  I notified patient of this update and she reports the copay is higher than what she was previously paying. She reports she was previously on a grant program. Unfortunately I do not have any information on this after hours with the holiday closure. She would like a call back from pharmacy team to discuss - will route to V. Patel. Patient appreciative of help.  Laurann Montana, PA-C

## 2023-07-04 ENCOUNTER — Telehealth: Payer: Self-pay | Admitting: Hematology and Oncology

## 2023-07-04 NOTE — Telephone Encounter (Signed)
Patient is aware of rescheduled appointment times/dates 

## 2023-07-05 NOTE — Telephone Encounter (Signed)
Patient is requesting call back to discuss the re-enrollment for this medication. She states that she was told she was denied and believes something may have been put in incorrectly. Requesting call back to discuss.

## 2023-07-05 NOTE — Telephone Encounter (Signed)
Call pharmacy and sorted [facility] co-pay card issue with the pharmacy and call to inform patient n/a LVM.

## 2023-07-05 NOTE — Telephone Encounter (Signed)
Call, N/A, LVM.  The grant info is correct. I verified from the portal.

## 2023-08-02 ENCOUNTER — Telehealth: Payer: Self-pay | Admitting: *Deleted

## 2023-08-02 NOTE — Telephone Encounter (Signed)
Received call from pt with complaint new mass and soreness under left breast x several weeks.  Pt states she is now no longer able to wear a bra due to the mass and pain.  Per MD pt needing [facility] appt with NP for further evaluation.  Appt scheduled, pt educated and verbalized understanding.

## 2023-08-09 ENCOUNTER — Inpatient Hospital Stay: Payer: Medicare PPO | Attending: Adult Health | Admitting: Adult Health

## 2023-08-09 VITALS — BP 145/61 | HR 65 | Temp 97.7°F | Resp 17 | Wt 164.1 lb

## 2023-08-09 DIAGNOSIS — C50311 Malignant neoplasm of lower-inner quadrant of right female breast: Secondary | ICD-10-CM

## 2023-08-09 DIAGNOSIS — Z853 Personal history of malignant neoplasm of breast: Secondary | ICD-10-CM | POA: Insufficient documentation

## 2023-08-09 DIAGNOSIS — Z9011 Acquired absence of right breast and nipple: Secondary | ICD-10-CM | POA: Insufficient documentation

## 2023-08-09 DIAGNOSIS — Z9221 Personal history of antineoplastic chemotherapy: Secondary | ICD-10-CM | POA: Diagnosis not present

## 2023-08-09 DIAGNOSIS — Z170 Estrogen receptor positive status [ER+]: Secondary | ICD-10-CM | POA: Diagnosis not present

## 2023-08-09 DIAGNOSIS — N6082 Other benign mammary dysplasias of left breast: Secondary | ICD-10-CM | POA: Insufficient documentation

## 2023-08-09 DIAGNOSIS — Z923 Personal history of irradiation: Secondary | ICD-10-CM | POA: Insufficient documentation

## 2023-08-09 DIAGNOSIS — L03313 Cellulitis of chest wall: Secondary | ICD-10-CM | POA: Diagnosis not present

## 2023-08-09 MED ORDER — AMOXICILLIN-POT CLAVULANATE 875-125 MG PO TABS: 1.0000 | ORAL_TABLET | Freq: Two times a day (BID) | ORAL | 0 refills | Status: DC

## 2023-08-09 NOTE — Progress Notes (Signed)
 [facility] Cancer Follow up:    Tami Brunet, DO [address] [place] [address] [place] [address]   DIAGNOSIS:  Cancer Staging  Breast cancer of lower-inner quadrant of right female breast [facility]) Staging form: Breast, AJCC 7th Edition - Clinical: Stage IA (T1b, N0, cM0) - Signed by Gudena, Vinay K, MD on 02/26/2014 Laterality: Right - Pathologic: Stage IA (T1c, N0, cM0) - Signed by Odean Potts, MD on 09/12/2014 Laterality: Right Tumor size (mm): 16 Method of lymph node assessment: Sentinel lymph node biopsy Method of detection of distant metastases: Clinical Residual tumor (R): R0 - None Estrogen receptor status: Positive Progesterone receptor status: Positive HER2 status: Negative Multi-gene signature risk of recurrence: Intermediate risk Stage used in treatment planning: Yes National guidelines used in treatment planning: Yes Type of national guideline used in treatment planning: NCCN   SUMMARY OF ONCOLOGIC HISTORY: Oncology History  Breast cancer of lower-inner quadrant of right female breast (HCC)  01/18/2014 Mammogram   U/S Breast: 7 x 7 x 9 mm irregular taller than wide hypoechoic mass with a peripheral echogenic rim in the right breast 4 o'clock position 7 cm from the nipple. No right axillary lymphadenopathy.    02/14/2014 Initial Diagnosis   Breast cancer-right Er 100%, PR 94%, Ki 67: 17%; Her2 Neg ratio 1.74; T1bN0Mo (Clinical Stage 1A)   04/02/2014 Surgery   Right breast lumpectomy: Invasive ductal carcinoma negative for lymph vascular invasion, 1.6 cm, grade 2, 3 SLN negative, reexcision margins benign, ER 100%, PR 94%, HER-2 negative Ki-67 70% T1 C. N0 M0 stage IA: Oncotype DX 29 (19%ROR)   05/07/2014 - 07/09/2014 Chemotherapy   Taxotere  Cytoxan  adjuvant chemotherapy every 3 weeks x4 cycles (after cycle one hospitalization for pain related to Neulasta  and severe gastritis due to steroids)   08/07/2014 - 09/16/2014 Radiation Therapy   Adjuvant radiation  therapy   10/04/2014 -  Anti-estrogen oral therapy   Anastrozole  1 mg daily changed to letrozole  09/16/2015 due to hot flashes switched to exemestane  07/02/18 due to myalgias switched back to letrozole  08/02/18 due to severe joint stiffness     CURRENT THERAPY: observation  INTERVAL HISTORY:  Tami [name] 73 y.o. female returns for f/u and evaluation of breast erythema and swelling, she has a knot underneath her left breast that began a few weeks ago and has been progressively worsening.  She denies any fever or chills.    Patient Active Problem List   Diagnosis Date Noted   Pelvic and perineal pain 08/19/2017   Right hip pain 07/26/2017   Weakness 07/26/2017   Gait abnormality 07/26/2017   Depression 04/23/2016   Nausea without vomiting 04/23/2016   Trigger finger, acquired 03/26/2016   Swelling of both ankles 12/18/2015   First degree heart block 12/18/2015   Abdominal pain    Abnormal LFTs    Oral candidiasis 05/14/2014   Dehydration 05/14/2014   Generalized pain 05/14/2014   PAF (paroxysmal atrial fibrillation) (HCC) 05/14/2014   PUD (peptic ulcer disease) 05/14/2014   Transaminitis 05/14/2014   Normocytic anemia 05/14/2014   Epigastric abdominal pain 05/14/2014   Family history of malignant neoplasm of breast    Breast cancer of lower-inner quadrant of right female breast (HCC) 02/14/2014   Hyperlipidemia 05/23/2013   Caregiver stress 10/23/2012   Osteoporosis 08/24/2012   DDD (degenerative disc disease), lumbar 08/23/2012   Female stress incontinence 05/28/2012   Hydronephrosis 05/28/2012   Numbness and tingling of right arm and leg 08/23/2011   Essential hypertension 09/14/2010  Abdominal pain, epigastric 09/14/2010   IRRITABLE BOWEL SYNDROME 12/29/2009   OSTEOARTHRITIS 12/29/2009   CARCINOMA IN SITU, 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  MEDICAL HISTORY: Past Medical History:  Diagnosis Date   Allergy    Anemia    as a child   Anxiety    Arthritis    Asthma    Atrial fibrillation (HCC)    Breast cancer (HCC) 2015   Cataract    Depression with anxiety    Dysrhythmia    hx AF   Family history of malignant neoplasm of breast    Fibromyalgia    GERD (gastroesophageal reflux disease)    Heart palpitations    Heartburn    Hyperlipidemia    Invasive ductal carcinoma of right breast (HCC) 02/08/2014   Lower Inner Quadrant   Irregular heartbeat    Malignant neoplasm of breast (female), unspecified site    Migraine    Numbness of arm    Right Arm   Osteopenia    Osteoporosis    Personal history of chemotherapy 2015   Right Breast Cancer   Personal history of radiation therapy 2015   Right Breast Cancer   Prediabetes    Pure hypercholesterolemia    SVT (supraventricular tachycardia)     SURGICAL HISTORY: Past Surgical History:  Procedure Laterality Date   30 Day Monitor  04/10/2009 to 05/23/2009   No End Of Summary Report Atrial Fib,Narrow Complex Tachycardia,Sinus Rhythm    ABDOMINAL HYSTERECTOMY  1987   BREAST EXCISIONAL BIOPSY Left    BREAST LUMPECTOMY Right 2015   BREAST LUMPECTOMY WITH NEEDLE LOCALIZATION AND AXILLARY SENTINEL LYMPH NODE BX Right 04/02/2014   Procedure: Right Axillary Lymphatic Mapping; Right axillary Sentinal Node Biopsy; Right Partial Mastectomy after wire localization;  Surgeon: Krystal Russell, MD;  Location: [facility] OR;  Service: General;  Laterality: Right;   BREAST SURGERY     Left   CYSTOSCOPY  04/25/2012   Pt may be a candidate for research anti-incontinence procedure protocol   Dipyridamole Myoview   05/27/2009   The post stress myocardial perfusion images show a normal pattern of perfusion in all regions.The post stress ejection fraction is 83 %. Normal  myocardial  perfusion imaging This is a low risk scan.   DOPPLER ECHOCARDIOGRAPHY  08/21/2010   Ejecion Fraction =>55% All chambers are normal in size and function No significant change from 2010.   lower Venous Dopplers  04/11/2009   Normal bilateral lower ext venous duplex   PORTACATH PLACEMENT N/A 04/26/2014   Procedure: ULTRASOUND GUIDED PORT A CATHE INSERTION;  Surgeon: Krystal Russell, MD;  Location: [facility] ORS;  Service: General;  Laterality: N/A;   Upper Arterial Dopplers  06/02/2011   Right Side PPG Demonstrates siginificantly diminshed waveforms with the Adson's maneuver which may suggest Thoracic Outlet Syndrone. This is a abnormal thoracic outlet arterial evaluation.    SOCIAL HISTORY: Social History   Socioeconomic History   Marital status: Married    Spouse name: Elsie    Number of children: 2   Years of education: 16   Highest education level: Bachelor's degree (e.g., BA, AB, BS)  Occupational History   Occupation: Retired  Tobacco Use   Smoking status: Never   Smokeless tobacco: Never  Vaping Use   Vaping status: Never Used  Substance and Sexual Activity  Alcohol use: No   Drug use: No   Sexual activity: Yes    Partners: Male    Comment: number of sex partners in the last 12 months 1  Other Topics Concern   Not on file  Social History Narrative   Exercise: walks on treadmill three times a week for 30-45 minutes at a time      Lives at home with husband.   2 cups caffeine daily.   Right-handed.      Social Drivers of Corporate Investment Banker Strain: Not on file  Food Insecurity: Not on file  Transportation Needs: Not on file  Physical Activity: Not on file  Stress: Not on file  Social Connections: Not on file  Intimate Partner Violence: Not on file    FAMILY HISTORY: Family History  Problem Relation Age of Onset   Skin cancer Mother    Diabetes Sister        Borderline   Hypertension Sister    Heart attack Sister    Breast cancer Sister 4        maternal half-sister; deceased 38   Diabetes Brother        Borderline; maternal half-brother   Stomach cancer Maternal Aunt    Throat cancer Maternal Aunt        stomach and throat; heavy smoker and drinker; deceased 59   Cancer Cousin        breast/ovarian; daughter of mat cousin with unk. primary at 86; pt's 1st cousin-once-removed   Other Father        accident   Anemia Daughter        IDA   Colon cancer Neg Hx    Esophageal cancer Neg Hx    Rectal cancer Neg Hx     Review of Systems  Constitutional:  Negative for appetite change, chills, fatigue, fever and unexpected weight change.  HENT:   Negative for hearing loss, lump/mass and trouble swallowing.   Eyes:  Negative for eye problems and icterus.  Respiratory:  Negative for chest tightness, cough and shortness of breath.   Cardiovascular:  Negative for chest pain, leg swelling and palpitations.  Gastrointestinal:  Negative for abdominal distention, abdominal pain, constipation, diarrhea, nausea and vomiting.  Endocrine: Negative for hot flashes.  Genitourinary:  Negative for difficulty urinating.   Musculoskeletal:  Negative for arthralgias.  Skin:  Negative for itching and rash.  Neurological:  Negative for dizziness, extremity weakness, headaches and numbness.  Hematological:  Negative for adenopathy. Does not bruise/bleed easily.  Psychiatric/Behavioral:  Negative for depression. The patient is not nervous/anxious.       PHYSICAL EXAMINATION    Vitals:   08/09/23 1322  BP: (!) 145/61  Pulse: 65  Resp: 17  Temp: 97.7 F (36.5 C)  SpO2: 98%    Physical Exam Constitutional:      General: She is not in acute distress.    Appearance: Normal appearance. She is not toxic-appearing.  HENT:     Head: Normocephalic and atraumatic.     Mouth/Throat:     Mouth: Mucous membranes are moist.     Pharynx: Oropharynx is clear. No oropharyngeal exudate or posterior oropharyngeal erythema.  Eyes:     General: No  scleral icterus. Cardiovascular:     Rate and Rhythm: Normal rate and regular rhythm.     Pulses: Normal pulses.     Heart sounds: Normal heart sounds.  Pulmonary:     Effort: Pulmonary effort is normal.  Breath sounds: Normal breath sounds.  Chest:     Comments: What appears to be an infected sebaceous cyst is located underneath the left breast, was able to express some purulent drainage.  It is erythematous, swollen, and tender to palpation.  There is an area of induration below the skin eyrthema Abdominal:     General: Abdomen is flat. Bowel sounds are normal. There is no distension.     Palpations: Abdomen is soft.     Tenderness: There is no abdominal tenderness.  Musculoskeletal:        General: No swelling.     Cervical back: Neck supple.  Lymphadenopathy:     Cervical: No cervical adenopathy.     Upper Body:     Right upper body: No axillary adenopathy.     Left upper body: No axillary adenopathy.  Skin:    General: Skin is warm and dry.     Findings: No rash.  Neurological:     General: No focal deficit present.     Mental Status: She is alert.  Psychiatric:        Mood and Affect: Mood normal.        Behavior: Behavior normal.        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 status post lumpectomy, Oncotype DX recurrence score 29 intermediate risk 19% risk of recurrence with hormonal therapy alone   Chemotherapy summary: Taxotere  and Cytoxan  4 started 05/07/2014 completed 07/09/2014 Long-term chemotherapy toxicities: Neuropathy Radiation therapy: Started 08/07/2014 completed 09/16/14 ----------------------------------------------------------------------------  Current treatment: Anastrozole  1 mg daily started 10/04/14 switched to letrozole  09/16/2015, switched to exemestane  06/30/2018 Patient took a  23-month break from antiestrogen therapy and she resumed 06/30/2018.  We tried a half a tablet but because of breaking unevenly she decided to stay on the 1 tablet a day.  Discontinued February 2022     Left breast infected sebaceous cyst: Prescribed Augmentin  BID, recommended warm compresses BID.  We will get her into see Puja, a PA who works at [employer] in a couple days to check on the area and evaluate whether she would benefit from incision and drainage.   RTC as needed based on the above.     All questions were answered. The patient knows to call the clinic with any problems, questions or concerns. We can certainly see the patient much sooner if necessary.  Total encounter time:20 minutes*in face-to-face visit time, chart review, lab review, care coordination, order entry, and documentation of the encounter time.    Morna Kendall, NP 08/11/23 3:00 PM Medical Oncology and Hematology [facility] [address] [place], [place] [address] Tel. [phone]    Fax. [phone]  *Total Encounter Time as defined by the Centers for Medicare and Medicaid Services includes, in addition to the face-to-face time of a patient visit (documented in the note above) non-face-to-face time: obtaining and reviewing outside history, ordering and reviewing medications, tests or procedures, care coordination (communications with other health care professionals or caregivers) and documentation in the medical record.

## 2023-08-10 ENCOUNTER — Telehealth: Payer: Self-pay

## 2023-08-10 NOTE — Telephone Encounter (Signed)
Called pt to inform her about appt tomorrow with Puja. Appt set for 11:15 at [facility]. Pt verbalized understanding of appt.

## 2023-08-11 ENCOUNTER — Encounter: Payer: Self-pay | Admitting: Adult Health

## 2023-08-11 ENCOUNTER — Encounter: Payer: Medicare PPO | Admitting: Adult Health

## 2023-08-11 DIAGNOSIS — L089 Local infection of the skin and subcutaneous tissue, unspecified: Secondary | ICD-10-CM | POA: Diagnosis not present

## 2023-08-11 DIAGNOSIS — L723 Sebaceous cyst: Secondary | ICD-10-CM | POA: Diagnosis not present

## 2023-08-11 NOTE — Assessment & Plan Note (Signed)
 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 status post lumpectomy, Oncotype DX recurrence score 29 intermediate risk 19% risk of recurrence with hormonal therapy alone   Chemotherapy summary: Taxotere  and Cytoxan  4 started 05/07/2014 completed 07/09/2014 Long-term chemotherapy toxicities: Neuropathy Radiation therapy: Started 08/07/2014 completed 09/16/14 ----------------------------------------------------------------------------  Current treatment: Anastrozole  1 mg daily started 10/04/14 switched to letrozole  09/16/2015, switched to exemestane  06/30/2018 Patient took a 31-month break from antiestrogen therapy and she resumed 06/30/2018.  We tried a half a tablet but because of breaking unevenly she decided to stay on the 1 tablet a day.  Discontinued February 2022     Left breast infected sebaceous cyst: Prescribed Augmentin  BID, recommended warm compresses BID.  We will get her into see Puja, a PA who works at [employer] in a couple days to check on the area and evaluate whether she would benefit from incision and drainage.   RTC as needed based on the above.

## 2023-08-16 ENCOUNTER — Ambulatory Visit: Payer: Medicare PPO | Admitting: Cardiovascular Disease

## 2023-08-16 ENCOUNTER — Encounter: Payer: Medicare PPO | Admitting: Adult Health

## 2023-08-18 ENCOUNTER — Telehealth: Payer: Self-pay | Admitting: Hematology and Oncology

## 2023-08-18 NOTE — Telephone Encounter (Signed)
Rescheduled appointment per incoming call from the patient. Patient is aware of the changes made to her upcoming appointment.

## 2023-08-22 ENCOUNTER — Other Ambulatory Visit: Payer: Self-pay

## 2023-08-22 MED ORDER — FLUCONAZOLE 200 MG PO TABS: 200.0000 mg | ORAL_TABLET | ORAL | 0 refills | Status: DC

## 2023-08-22 NOTE — Progress Notes (Signed)
Pt called and reports she completed Augmentin prescribed by Lillard Anes, DNP 08/09/23. She reports over the weekendm developing a "raging yeast infection". She is asking for Diflucan.  Per NP, Diflucan 200 mg 1 tab every other day X3 sent to pt's preferred phx. Pt verbalized thanks and understanding.

## 2023-08-24 ENCOUNTER — Ambulatory Visit: Payer: TRICARE For Life (TFL) | Admitting: Hematology and Oncology

## 2023-08-25 ENCOUNTER — Other Ambulatory Visit: Payer: Self-pay | Admitting: General Surgery

## 2023-08-25 ENCOUNTER — Ambulatory Visit: Payer: TRICARE For Life (TFL) | Admitting: Hematology and Oncology

## 2023-08-25 DIAGNOSIS — N6082 Other benign mammary dysplasias of left breast: Secondary | ICD-10-CM | POA: Diagnosis not present

## 2023-08-26 ENCOUNTER — Other Ambulatory Visit: Payer: Self-pay

## 2023-08-26 ENCOUNTER — Other Ambulatory Visit: Payer: Self-pay | Admitting: General Surgery

## 2023-08-26 DIAGNOSIS — Z0000 Encounter for general adult medical examination without abnormal findings: Secondary | ICD-10-CM

## 2023-09-02 ENCOUNTER — Telehealth: Payer: Self-pay | Admitting: Cardiovascular Disease

## 2023-09-02 NOTE — Telephone Encounter (Signed)
 Spoke with pt regarding recent issues with her heart rate. Patient identification verified by 2 forms. Pt states that her medication usually does well at controlling her heart rate. She is on flecainide, toprol-xl and lopressor PRN for palpitations. Pt states that over the last week she has been having issues controlling her heart rate. She is noting it with her blood pressure cuff at times but is also going by how she feels. Pt does admit to be under more stress recently and feels like this could contribute. Pt states that last night she had to take 3 doses of lopressor (25mg ) to get heart rate controlled. Pt stated she felt dizzy, short of breath and fatigued. Pt did take her blood pressure last night in between doses 139/72, she forgot to right down her heart rate. Pt does know when she is in a-fib and this feels similar. Pt takes her blood pressure on the phone, 146/75 HR: 56. Pt has appointment in May, but would like to be seen sooner. Pt is very concerned, she says that she has tried to ignore her symptoms but now she feels like she should talk to someone regarding this. Appointment made for pt. Advised pt to be mindful that lopressor can also lower blood pressure therefore when she is requiring multiple doses she should check her blood pressure between doses. ED precautions discussed with pt. Pt verbalizes understanding.

## 2023-09-02 NOTE — Telephone Encounter (Signed)
 STAT if HR is under 50 or over 120 (normal HR is 60-100 beats per minute)  What is your heart rate? 60   Do you have a log of your heart rate readings (document readings)?   Do you have any other symptoms? Dizziness    Patient states she has been having increased HR and has only been going off of how it feels. States that her HR has been increasing more often and causing dizziness. Would like to discuss medication. Please advise.

## 2023-09-06 NOTE — Telephone Encounter (Signed)
 Acknowledged. Dr. Tresa Endo will not be back in the office until 3/10. He would have wanted for the patient to come in earlier due to her symptoms. There will be no problems with seeing this patient 3/11

## 2023-09-09 ENCOUNTER — Encounter (HOSPITAL_BASED_OUTPATIENT_CLINIC_OR_DEPARTMENT_OTHER): Payer: Self-pay

## 2023-09-11 ENCOUNTER — Other Ambulatory Visit: Payer: Self-pay | Admitting: General Surgery

## 2023-09-12 ENCOUNTER — Ambulatory Visit
Admission: RE | Admit: 2023-09-12 | Discharge: 2023-09-12 | Disposition: A | Discharge status: Home / Self Care | Payer: Medicare PPO | Source: Ambulatory Visit | Attending: General Surgery | Admitting: General Surgery

## 2023-09-12 DIAGNOSIS — Z0000 Encounter for general adult medical examination without abnormal findings: Secondary | ICD-10-CM

## 2023-09-12 DIAGNOSIS — Z1231 Encounter for screening mammogram for malignant neoplasm of breast: Secondary | ICD-10-CM | POA: Diagnosis not present

## 2023-09-13 ENCOUNTER — Ambulatory Visit: Payer: Medicare PPO | Attending: Cardiovascular Disease | Admitting: Cardiovascular Disease

## 2023-09-13 ENCOUNTER — Encounter (HOSPITAL_BASED_OUTPATIENT_CLINIC_OR_DEPARTMENT_OTHER): Payer: Self-pay | Admitting: General Surgery

## 2023-09-13 ENCOUNTER — Encounter: Payer: Self-pay | Admitting: Cardiovascular Disease

## 2023-09-13 ENCOUNTER — Other Ambulatory Visit: Payer: Self-pay

## 2023-09-13 DIAGNOSIS — E7800 Pure hypercholesterolemia, unspecified: Secondary | ICD-10-CM | POA: Diagnosis not present

## 2023-09-13 DIAGNOSIS — I10 Essential (primary) hypertension: Secondary | ICD-10-CM | POA: Diagnosis not present

## 2023-09-13 DIAGNOSIS — I440 Atrioventricular block, first degree: Secondary | ICD-10-CM

## 2023-09-13 DIAGNOSIS — I471 Supraventricular tachycardia, unspecified: Secondary | ICD-10-CM

## 2023-09-13 DIAGNOSIS — G4733 Obstructive sleep apnea (adult) (pediatric): Secondary | ICD-10-CM | POA: Diagnosis not present

## 2023-09-13 DIAGNOSIS — I480 Paroxysmal atrial fibrillation: Secondary | ICD-10-CM

## 2023-09-13 MED ORDER — METOPROLOL SUCCINATE ER 50 MG PO TB24
ORAL_TABLET | ORAL | 3 refills | Status: DC
Start: 2023-09-13 — End: 2023-09-14

## 2023-09-13 NOTE — Progress Notes (Signed)
   Received message from Lab  Lab states patient presented for lab draw, order expired   RN re entered order

## 2023-09-13 NOTE — Patient Instructions (Signed)
 Medication Instructions:  Increase the Metoprolol Succinate to 37.5mg  (1.5 tablet) .   If the palpitations persist take 50mg  (one whole tablet) . Take once a day.   *If you need a refill on your cardiac medications before your next appointment, please call your pharmacy*   Lab Work: No labs were ordered during today's visit.  If you have labs (blood work) drawn today and your tests are completely normal, you will receive your results only by: MyChart Message (if you have MyChart) OR A paper copy in the mail If you have any lab test that is abnormal or we need to change your treatment, we will call you to review the results.   Testing/Procedures: No procedures were ordered during today's visit.    Follow-Up: At [facility], you and your health needs are our priority.  As part of our continuing mission to provide you with exceptional heart care, we have created designated Provider Care Teams.  These Care Teams include your primary Cardiologist (physician) and Advanced Practice Providers (APPs -  Physician Assistants and Nurse Practitioners) who all work together to provide you with the care you need, when you need it.  We recommend signing up for the patient portal called "MyChart".  Sign up information is provided on this After Visit Summary.  MyChart is used to connect with patients for Virtual Visits (Telemedicine).  Patients are able to view lab/test results, encounter notes, upcoming appointments, etc.  Non-urgent messages can be sent to your provider as well.   To learn more about what you can do with MyChart, go to [email or web address].    Your next appointment:   6-8 month(s)  Provider:   Dr Royann Shivers          1st Floor: - Lobby - Registration  - Pharmacy  - Lab - Cafe   2nd Floor: - PV Lab - Diagnostic Testing (echo, CT, nuclear med)   3rd Floor: - Vacant   4th Floor: - TCTS (cardiothoracic surgery) - AFib Clinic - Structural Heart Clinic -  Vascular Surgery  - Vascular Ultrasound   5th Floor: - [facility] Cardiology (general and EP) - Clinical Pharmacy for coumadin, hypertension, lipid, weight-loss medications, and med management appointments      Valet parking services will be available as well.         Other Instructions: If you have any questions or concerns regarding your c-pap, bi-pap or sleep accessories, please contact Brandie Rorie at [phone].

## 2023-09-13 NOTE — Progress Notes (Signed)
 Patient ID: [name], female   DOB: 1951-01-12, 73 y.o.   MRN: [id number]      HPI: [name] is a 73 year old African American female who is a former patient of Dr. Alanda [name]. She presents for a 2 month cardiology evaluation.  [name] has a history of palpitations, paroxysmal supraventricular tachycardia as well as paroxysmal atrial fibrillation which have been detected on remote monitoring. She has been treated with low-dose flecainide 50 mg twice a day in addition to metoprolol succinate 50 mg daily. She also has a history of hyperlipidemia in the past has had LDLs in the 145 range but is intolerant to several statins including Crestor. She also states she developed some myalgias secondary to Zetia. When she was last seen by Dr. Alanda [name] he had written her a prescription for livalo 1 mg but she never took this.  She also has continued problems with low back and hip discomfort and has some sciatica issues. She is followed by Dr. Turner [name]. She also has undergone MRIs of her back.  An echo Doppler study in February 2012 showed normal systolic and diastolic function. She did not have significant valvular pathology. A nuclear perfusion study in 2010 which was normal.  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  I saw her prior to undergoing her lumpectomy and prior to radiation and chemotherapy.  A subsequent 2-D echo Doppler study on 03/28/2014 showed an ejection fraction at 60-65% and was normal.  She tolerated lumpectomy well and completed chemotherapy and radiation and  is on letrozole for planned 7 years.   I had not seen her since 2017 and last saw her in April 2019.  He done well from a cardiac standpoint and denied any chest pain, palpitations, or palpitations.  I recently  saw her, she was experiencing poor sleep, admitted to snoring and had frequent nocturia.  She was going to bed at 9:30 PM and waking up at 7:30 AM.  She continued to be on flecainide 50 mg twice a day in addition to Toprol-XL 50 mg without recurrent arrhythmia.  Is a history of anxiety and has been on a trip to [place] in addition to fluoxetine.  Because of her poor sleep, I referred her for a sleep study.  On Nov 06, 2017 she underwent a diagnostic polysomnogram.  This was notable in that she did not have any significant obstructive sleep apnea overall.  Her AHI was 0.3/h.  However, she had increased arousals and her RDI was increased to 12.6.  She was unable to achieve any REM sleep and it was possible that potential of the severity of sleep disordered breathing may have been underestimated due to the absence of REM sleep on this study.  She did not have any significant oxygen desaturation with a nadir of 92%.  There was markedly reduced sleep efficiency at 50.9%.  There was soft snoring. She did not have clinically significant periodic limb movements during sleep.  I have not seen her since her last evaluation with me in August 2019 at which time she was maintaining sinus rhythm with flecainide for her PAF/SVT and her blood pressure and lipids were controlled.  She was evaluated by Tami Pimple, PA-C in November 2020 and remained stable from a cardiac standpoint.  She did experience an isolated episode  of palpitations 1 month previously which occurred after she had forgotten to take her medication.  I saw her in November 2021 at which time she continued to feel well.  She was experiencing occasional short-lived palpitations which seem to be precipitated by stress.  She was only  taking metoprolol tartrate 25 mg once a day.  She is followed by Dr. Renne [name].  She is on low-dose rosuvastatin at 5 mg.  She continued to be on flecainide 50 mg twice a day.  She was unaware of breakthrough AF.  She had undergone  recent laboratory at [facility].  Hemoglobin 12.9, hematocrit was 38.0.  Creatinine was normal at 0.9.  LFTs were normal.  Ferritin was 82.  TSH 1.07.  B12 1514.  Vitamin D 96.  She has continued to be followed by Dr. Pamelia [name] for her remote breast cancer which has been stable.    I saw her in April 2022 and since her prior evaluation she had sstopped taking rosuvastatin and was now been on Livalo 2 mg for the past 6 weeks.  She was unaware of any recurrent atrial fibrillation.  She has not had recent laboratory since Livalo initiation.  She denies any chest pain, PND orthopnea.   I recommended that she undergo follow-up laboratory.  She was to follow-up with Dr. Ave [name] for her remote breast cancer and Dr. Marina [name] from a GI standpoint.  At Dr. Lamar [name] most recent evaluation Nexium was increased to 40 mg daily and she was advised on reflux precautions.  She was scheduled to undergo modified barium swallow to rule out oropharyngeal dysphagia with aspiration and he felt she ultimately would need upper endoscopy with probable esophageal dilatation.  When I last saw her in May 18, 2021 she felt well.  At that time she was experiencing some left chest wall tenderness and also complaining of some mild exertional chest pressure over the previous several months.  On exam she had mild tenderness over the left costochondral region suggesting musculoskeletal etiology.  Her blood pressure was elevated and I recommended the addition of amlodipine 5 mg which would be helpful both for blood pressure control, CAD as well as potential esophageal spasm.  I recommended that she undergo coronary CTA for further evaluation of her exertional chest pain component and recommended follow-up laboratory.  She was on Nexium for GERD and is closely followed by Dr. Marina [name].  She continues to be on flecainide 50 mg twice a day without recurrent AF and metoprolol succinate 25 mg daily.  She underwent coronary CTA on June 08, 2021.  Calcium score was 0 and there was no evidence for coronary atherosclerotic plaque.  She did not have any incidental noncardiac findings over read of her CT of her chest.  I last saw her on July 30, 2021.  She denies any recurrent chest wall discomfort.  She admits to being tired.  Her blood pressure at home has been stable around 125-130s systolically over 72.  She was going to bed around 9 falling asleep around 11, experiencing nocturia at least 3 times per night and waking up at 10 AM.  Her sleep was nonrestorative.  During that evaluation, her significant sleep complaints tired, nonrestorative sleep with frequent awakenings and nocturia several times per night I recommended she undergo a subsequent home sleep study for reassessment of potential sleep apnea.  She underwent a sleep study on August 25, 2021 which was a home sleep test.  This revealed severe sleep apnea with an AHI  49.9.  There was no significant oxygen desaturation with O2 nadir at only 91%.  She snored for 34.5 minutes.  I have not seen her since.  She apparently was evaluated by Edd Fabian 02/11/2023.  At time she remained stable without any recurrent atrial fibrillation.  Her sleep study was reviewed and at that time she preferred a trial of an oral appliance rather than initiate CPAP therapy.  He subsequently was evaluated by Dr. Althea Grimmer and has a customized oral appliance which she has been using for approximately 9 months with benefit.  She is sleeping well.  She is dreaming.  She is unaware of breakthrough snoring.  Nocturia has decreased.  Presently, she feels well.  However recently she has been under increased stress related to need for significant home improvements.  Associated with the increased stress she has noted slight increase in her prior palpitations.  She has continued to be on flecainide which she takes one half of the 100 mg pill twice a day.  She is on metoprolol succinate 25 mg and occasionally has  taken an extra dose during these periods of increased stress.  She also has a prescription for metoprolol to tartrate.  She is on Repatha injection every 2 weeks and has prior intolerance to statins.  She is on paroxetine.  She is stable with reference to her prior right breast cancer for which she underwent lobectomy, chemotherapy and radiation.  She has had issues with developing of a subcutaneous cyst of her left breast and is scheduled undergo incision on March 17 by Dr. Emelia Loron.  Denies any chest pain.  There is no presyncope or syncope.  She underwent laboratory this morning, results pending.  She presents for evaluation.  Past Medical History:  Diagnosis Date   Allergy    Anemia    as a child   Anxiety    Arthritis    Asthma    Atrial fibrillation (HCC)    Breast cancer (HCC) 2015   Cataract    Depression with anxiety    Dysrhythmia    hx AF   Family history of malignant neoplasm of breast    Fibromyalgia    GERD (gastroesophageal reflux disease)    Heart palpitations    Heartburn    Hyperlipidemia    Invasive ductal carcinoma of right breast (HCC) 02/08/2014   Lower Inner Quadrant   Irregular heartbeat    Malignant neoplasm of breast (female), unspecified site    Migraine    Numbness of arm    Right Arm   Osteopenia    Osteoporosis    Personal history of chemotherapy 2015   Right Breast Cancer   Personal history of radiation therapy 2015   Right Breast Cancer   Prediabetes    Pure hypercholesterolemia    SVT (supraventricular tachycardia) (HCC)     Past Surgical History:  Procedure Laterality Date   30 Day Monitor  04/10/2009 to 05/23/2009   No End Of Summary Report Atrial Fib,Narrow Complex Tachycardia,Sinus Rhythm    ABDOMINAL HYSTERECTOMY  1987   BREAST EXCISIONAL BIOPSY Left    BREAST LUMPECTOMY Right 2015   BREAST LUMPECTOMY WITH NEEDLE LOCALIZATION AND AXILLARY SENTINEL LYMPH NODE BX Right 04/02/2014   Procedure: Right Axillary Lymphatic Mapping;  Right axillary Sentinal Node Biopsy; Right Partial Mastectomy after wire localization;  Surgeon: Avel Peace, MD;  Location: [facility] OR;  Service: General;  Laterality: Right;   BREAST SURGERY     Left   CYSTOSCOPY  04/25/2012  Pt may be a candidate for research anti-incontinence procedure protocol   Dipyridamole Myoview  05/27/2009   The post stress myocardial perfusion images show a normal pattern of perfusion in all regions.The post stress ejection fraction is 83 %. Normal  myocardial perfusion imaging This is a low risk scan.   DOPPLER ECHOCARDIOGRAPHY  08/21/2010   Ejecion Fraction =>55% All chambers are normal in size and function No significant change from 2010.   lower Venous Dopplers  04/11/2009   Normal bilateral lower ext venous duplex   PORTACATH PLACEMENT N/A 04/26/2014   Procedure: ULTRASOUND GUIDED PORT A CATHE INSERTION;  Surgeon: Avel Peace, MD;  Location: [facility] ORS;  Service: General;  Laterality: N/A;   Upper Arterial Dopplers  06/02/2011   Right Side PPG Demonstrates siginificantly diminshed waveforms with the Adson's maneuver which may suggest Thoracic Outlet Syndrone. This is a abnormal thoracic outlet arterial evaluation.    Allergies  Allergen Reactions   Latex Swelling and Rash    Skin swelling    Sulfonamide Derivatives Anaphylaxis, Shortness Of Breath, Nausea Only and Other (See Comments)    Sweating, headache    Clarithromycin Nausea Only and Other (See Comments)    Headache    Crestor [Rosuvastatin] Other (See Comments)    Muscle aches, pain, weakness in lower extremities   Statins Other (See Comments)    Muscle soreness and weakness w/ Rosuvastatin and Zetia.   Ciprofloxacin Hcl Nausea Only, Palpitations and Other (See Comments)    Headache     Current Outpatient Medications  Medication Sig Dispense Refill   albuterol (VENTOLIN HFA) 108 (90 Base) MCG/ACT inhaler Inhale 2 puffs into the lungs every 6 (six) hours as needed for wheezing or shortness of  breath. 8 g 2   aspirin EC 81 MG tablet Take 81 mg by mouth daily.     Calcium Carbonate-Vitamin D (CALTRATE 600+D PO) Take by mouth.     cetirizine (ZYRTEC) 10 MG tablet Take 10 mg by mouth daily.     Cholecalciferol (VITAMIN D3) 25 MCG (1000 UT) CAPS Take 1 capsule by mouth daily.     Evolocumab (REPATHA SURECLICK) 140 MG/ML SOAJ INJECT 140 MG INTO THE SKIN EVERY 14 DAYS 6 mL 3   flecainide (TAMBOCOR) 100 MG tablet Take 1/2 (one-half) tablet by mouth twice daily 90 tablet 2   fluticasone (FLONASE) 50 MCG/ACT nasal spray Place 2 sprays into both nostrils daily. 16 g 3   hyoscyamine (LEVSIN SL) 0.125 MG SL tablet Place 1 tablet (0.125 mg total) under the tongue every 4 (four) hours as needed (Abdominal Pain, spasm, IBS). 60 tablet 4   ibuprofen (ADVIL,MOTRIN) 200 MG tablet Take 200 mg by mouth every 6 (six) hours as needed for headache or moderate pain (pain score 4-6).     Meth-Hyo-M Bl-Na Phos-Ph Sal (URIBEL) 118 MG CAPS Take 118 mg by mouth as needed (for bladder). Reported on 09/09/2015  2   methocarbamol (ROBAXIN) 750 MG tablet Take 750 mg by mouth 4 (four) times daily.     metoprolol succinate (TOPROL-XL) 50 MG 24 hr tablet Take 37.5mg  (1.5 tablet).  If the palpitations persist take 50mg  (one whole tablet) . 90 tablet 3   metoprolol tartrate (LOPRESSOR) 25 MG tablet Take 1 tablet (25 mg) as needed for palpitations 30 tablet 3   PARoxetine (PAXIL) 20 MG tablet TAKE 1 TABLET BY MOUTH ONCE DAILY . APPOINTMENT REQUIRED FOR FUTURE REFILLS 90 tablet 3   No current facility-administered medications for this visit.  Social history is notable in that she is married and has 2 children and 3 grandchildren. 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 There is no tobacco or alcohol use.   Family History  Problem Relation Age of Onset   Skin cancer Mother    Diabetes Sister        Borderline   Hypertension Sister    Heart attack Sister    Breast cancer  Sister 58       maternal half-sister; deceased 89   Diabetes Brother        Borderline; maternal half-brother   Stomach cancer Maternal Aunt    Throat cancer Maternal Aunt        stomach and throat; heavy smoker and drinker; deceased 35   Cancer Cousin        breast/ovarian; daughter of mat cousin with unk. primary at 62; pt's 1st cousin-once-removed   Other Father        accident   Anemia Daughter        IDA   Colon cancer Neg Hx    Esophageal cancer Neg Hx    Rectal cancer Neg Hx     ROS General: Negative; No fevers, chills, or night sweats;  HEENT: Negative; No changes in vision or hearing, sinus congestion, difficulty swallowing Pulmonary: Negative; No cough, wheezing, shortness of breath, hemoptysis Cardiovascular: See HPI Positive for occasional ankle swelling GI: Negative; No nausea, vomiting, diarrhea, or abdominal pain GU: Negative; No dysuria, hematuria, or difficulty voiding Musculoskeletal: Negative; no myalgias, joint pain, or weakness Hematologic/Oncology: As above, positive for invasive ductal carcinoma of the right breast .  Status post chemotherapy and radiation treatment following lumpectomy Endocrine: Negative; no heat/cold intolerance; no diabetes Neuro: Negative; no changes in balance, headaches Skin: Negative; No rashes or skin lesions; sebaceous cyst left breast Psychiatric: Negative; No behavioral problems, depression Sleep: Nonrestorative sleep, snoring, frequent nocturia, daytime sleepiness, no bruxism, restless legs, hypnogognic hallucinations, no cataplexy  An Epworth Sleepiness Scale score was calculated in the office today 07/30/21 and this endorsed at 3.  Other comprehensive 14 point system review is negative.   PE BP (!) 140/78 (BP Location: Left Arm, Patient Position: Sitting, Cuff Size: Large)   Pulse 63   Ht 5' 3.5" (1.613 m)   Wt 167 lb (75.8 kg)   LMP 07/06/1985 (Approximate)   SpO2 99%   BMI 29.12 kg/m    Repeat blood pressure by  me was improved at 118/64  Wt Readings from Last 3 Encounters:  09/13/23 167 lb (75.8 kg)  08/09/23 164 lb 1.6 oz (74.4 kg)  11/11/22 159 lb 3.2 oz (72.2 kg)   General: Alert, oriented, no distress.  Skin: normal turgor, no rashes, warm and dry HEENT: Normocephalic, atraumatic. Pupils equal round and reactive to light; sclera anicteric; extraocular muscles intact; Nose without nasal septal hypertrophy Mouth/Parynx benign; Mallinpatti scale 3 Neck: No JVD, no carotid bruits; normal carotid upstroke Lungs: clear to ausculatation and percussion; no wheezing or rales Chest wall: without tenderness to palpitation Heart: PMI not displaced, RRR, s1 s2 normal, 1/6 systolic murmur, no diastolic murmur, no rubs, gallops, thrills, or heaves Abdomen: soft, nontender; no hepatosplenomehaly, BS+; abdominal aorta nontender and not dilated by palpation. Back: no CVA tenderness Pulses 2+ Musculoskeletal: full range of motion, normal strength, no joint deformities Extremities: no clubbing cyanosis or edema, Homan's sign negative  Neurologic: grossly nonfocal; Cranial nerves grossly wnl Psychologic: Normal mood and affect   EKG Interpretation  Date/Time:  Tuesday September 13 2023 14:48:23 EDT Ventricular Rate:  63 PR Interval:  230 QRS Duration:  82 QT Interval:  418 QTC Calculation: 427 R Axis:   9  Text Interpretation: Sinus rhythm with 1st degree A-V block Nonspecific T wave abnormality When compared with ECG of 14-May-2014 16:27, No significant change was found Confirmed by Nicki Guadalajara ([id number]) on 09/13/2023 5:12:20 PM    July 30, 2021 ECG (independently read by me): Sinus bradycardia at 55, 1st degree AV block PR 250 msec, [facility]  May 18, 2021 ECG (independently read by me): Sinus bradycardia at 52, 1st degree AV block  October 06, 2020 ECG (independently read by me): Sinus bradycardia at 59, 1st degree AV block;  PR 250 msec,   November 2021 ECG (independently read by me):  Sinus  bradycardia at 57 bpm with first-degree AV block, PR interval 262 ms.  No ectopy.  Nonspecific T changes.  August 2019 ECG (independently read by me): Normal sinus rhythm at 60 bpm.  First-degree AV block, PR interval 236 ms per;  Nonspecific T changes.  April 2019 ECG (independently read by me): Sinus bradycardia 55 bpm with first-degree AV block.  PR interval 258 ms.  June 2017 ECG (independently read by me): Sinus bradycardia 55 bpm.  First-degree AV block with a PR interval at 250 ms.  Nonspecific ST-T changes.  April 2016 ECG (independently read by me): Normal sinus rhythm at 63 bpm.  First-degree AV block with a PR interval at 254 ms.  QTc interval 49 ms.  Nonspecific T changes.  September 2015 ECG sinus rhythm with first-degree AV block.  Nonspecific T changes V1 through V3.  Prior ECG: Sinus rhythm at 57 beats per minute, with first degree AV block with PR interval 252 ms. QTc interval is normal at 389 ms. Nonspecific T changes.  LABS:     Latest Ref Rng & Units 05/20/2021   10:36 AM 12/05/2020   10:24 AM 05/26/2020   11:02 AM  BMP  Glucose 70 - 99 mg/dL 97  604  98   BUN 8 - 27 mg/dL 9  8  13    Creatinine 0.57 - 1.00 mg/dL 5.40  9.81  1.91   BUN/Creat Ratio 12 - 28 9  9     Sodium 134 - 144 mmol/L 141  143  140   Potassium 3.5 - 5.2 mmol/L 4.3  4.1  3.9   Chloride 96 - 106 mmol/L 104  105  105   CO2 20 - 29 mmol/L 26  24  29    Calcium 8.7 - 10.3 mg/dL 9.6  9.7  9.6         Latest Ref Rng & Units 04/08/2022   12:38 PM 05/20/2021   10:36 AM 12/05/2020   10:24 AM  Hepatic Function  Total Protein 6.0 - 8.5 g/dL 7.2  7.2  7.1   Albumin 3.8 - 4.8 g/dL 4.5  4.6  4.6   AST 0 - 40 IU/L 27  22  21    ALT 0 - 32 IU/L 24  18  19    Alk Phosphatase 44 - 121 IU/L 91  114  112   Total Bilirubin 0.0 - 1.2 mg/dL 0.4  0.6  0.3   Bilirubin, Direct 0.00 - 0.40 mg/dL 4.78           Latest Ref Rng & Units 05/20/2021   10:36 AM 10/12/2017   11:42 AM 05/09/2017    2:25 PM  CBC  WBC 3.4  -  10.8 x10E3/uL 4.0  3.4  4.2   Hemoglobin 11.1 - 15.9 g/dL 57.8  46.9  62.9   Hematocrit 34.0 - 46.6 % 36.3  37.9  37.0   Platelets 150 - 450 x10E3/uL 225  243  275    Lab Results  Component Value Date   MCV 92 05/20/2021   MCV 93 10/12/2017   MCV 90 05/09/2017    Lab Results  Component Value Date   TSH 2.080 10/12/2017   No results found for: "HGBA1C"   Lipid Panel     Component Value Date/Time   CHOL 235 (H) 05/21/2022 1114   CHOL 237 (H) 06/22/2013 1405   TRIG 146 05/21/2022 1114   TRIG 103 06/22/2013 1405   HDL 70 05/21/2022 1114   HDL 74 06/22/2013 1405   CHOLHDL 3.4 05/21/2022 1114   CHOLHDL 3.2 07/28/2016 1151   VLDL 22 07/28/2016 1151   LDLCALC 139 (H) 05/21/2022 1114   LDLCALC 142 (H) 06/22/2013 1405     BNP No results found for: "PROBNP"  Lipid Panel     Component Value Date/Time   CHOL 235 (H) 05/21/2022 1114   CHOL 237 (H) 06/22/2013 1405   TRIG 146 05/21/2022 1114   TRIG 103 06/22/2013 1405   HDL 70 05/21/2022 1114   HDL 74 06/22/2013 1405   CHOLHDL 3.4 05/21/2022 1114   CHOLHDL 3.2 07/28/2016 1151   VLDL 22 07/28/2016 1151   LDLCALC 139 (H) 05/21/2022 1114   LDLCALC 142 (H) 06/22/2013 1405     RADIOLOGY: No results found.  IMPRESSION:  1. Essential hypertension   2. PAF (paroxysmal atrial fibrillation) (HCC)   3. PSVT   4. First degree heart block   5. Pure hypercholesterolemia   6. OSA (obstructive sleep apnea)    ASSESSMENT AND PLAN: Ms. Wismer is a 73 year-old African-American female who has a history of PSVT/PAF which has been  controlled with low-dose flecainide 50 twice a day in addition to metoprolol succinate 25 mg daily.  When I saw her in 2019 she was maintaining sinus rhythm and had stable first-degree AV block.  Mostly, she had undergone a coronary CTA which showed a calcium score of 0 without atherosclerosis.  June 08, 2021.  Chest CT over read not show any significant incidental noncardiac findings.  She has a history  of right breast CVA which has been stable following lumpectomy, chemotherapy and radiation followed by Dr. Celso Sickle.  She will be undergoing excision of sebaceous cyst on her left breast on March 17 by Dr. Dwain Sarna.  Her blood pressure today on repeat by me was excellent at 118/64.  Patient admits that she has had increased stress at home related to issues with her house and need of significant work and repair at expense.  This has resulted in increased palpitations.  She has been on baseline metoprolol succinate 25 mg daily and has a as needed tartrate prescription.  Presently I am recommending she increase metoprolol succinate to 37.5 mg daily.  She will monitor her heart rate and blood pressure.  If she continues to experience increased palpitations further titration to 50 mg can be undertaken.  I again discussed her home sleep study which demonstrated significant sleep apnea.  She subsequently was evaluated by Dr. Althea Grimmer and has a customized oral appliance.  She believes she is sleeping well has significant recall of her dreams and has less nocturia.  She is now on Repatha every 2-week injection for hyperlipidemia since she did not  tolerate statins remotely.  She apparently had repeat lab work this morning, results pending.  I will contact her regarding the results..  She is aware of my impending retirement later this year.  I will transition her to the care of Dr. Royann Shivers and we will set up for initial evaluation in 6 to 8 months or sooner if issues arise.  It has certainly been a pleasure to take care of her over these many years.    Lennette Bihari, MD, [facility] 09/13/2023 5:28 PM

## 2023-09-14 ENCOUNTER — Encounter: Payer: Self-pay | Admitting: Pharmacist

## 2023-09-14 ENCOUNTER — Other Ambulatory Visit: Payer: Self-pay

## 2023-09-14 LAB — LIPID PANEL
Chol/HDL Ratio: 2 ratio (ref 0.0–4.4)
Cholesterol, Total: 143 mg/dL (ref 100–199)
HDL: 71 mg/dL (ref >39)
LDL Chol Calc (NIH): 53 mg/dL (ref 0–99)
Triglycerides: 105 mg/dL (ref 0–149)
VLDL Cholesterol Cal: 19 mg/dL (ref 5–40)

## 2023-09-14 MED ORDER — METOPROLOL SUCCINATE ER 25 MG PO TB24: ORAL_TABLET | ORAL | 3 refills | Status: DC

## 2023-09-14 NOTE — Telephone Encounter (Signed)
 [facility] pharmacy stating that pt was not prescribed enough tablets for pt to take metoprolol 37.5. please send in enough tablets. Thanks

## 2023-09-14 NOTE — Telephone Encounter (Signed)
 Sent updated prescription based on Dr. Landry Dyke encounter notes

## 2023-09-14 NOTE — Telephone Encounter (Signed)
 At pt's last office visit with Dr. Tresa Endo, he stated that he wanted pt to increased medication metoprolol, per Dr. Tresa Endo She has been on baseline metoprolol succinate 25 mg daily and has a as needed tartrate prescription. Presently I am recommending she increase metoprolol succinate to 37.5 mg daily. Pt is taking metoprolol succinate 50 mg daily. Pt's pharmacy is requesting clarification for direction for pt taking metoprolol succinate 50 mg tablet. Please address

## 2023-09-16 ENCOUNTER — Telehealth: Payer: Self-pay | Admitting: Cardiovascular Disease

## 2023-09-16 ENCOUNTER — Other Ambulatory Visit: Payer: Self-pay

## 2023-09-16 NOTE — Telephone Encounter (Signed)
 Called and spoke to Tami [name] with Tami [name] states, would like to clarify Metoprolol instruction  Informed Tami [name] per OV note patient to take 37.5 mg (1.5 tablet) if palpitations increase take 50mg  (2 tablets)  Tami [name] verbalized understanding, no questions at this time    3/12 OV:

## 2023-09-16 NOTE — Telephone Encounter (Signed)
 Pt c/o medication issue:  1. Name of Medication:   metoprolol succinate (TOPROL-XL) 25 MG 24 hr tablet   2. How are you currently taking this medication (dosage and times per day)?   3. Are you having a reaction (difficulty breathing--STAT)?   4. What is your medication issue?   Caller Fayrene Fearing) wants a call back to clarify the dosage and instructions for this medication.

## 2023-09-16 NOTE — Telephone Encounter (Signed)
 [facility] pharmacy is asking for clarification for pt's medication metoprolol 25 mg tablets. Directions states for pt to take 37.5 mg (1.5 tablets) daily. If palpitations persist take 50 mg (one whole tablet). Pt was not give enough medication for pt to take 1.5 tablets daily and pt medication is 25 mg tablets, so pt can not take a whole tablet to each 50 mg. Please clarify directions and resend to pt pharmacy so pt can get their medication. Please address

## 2023-09-16 NOTE — Telephone Encounter (Signed)
 Called pharmacy to clarify pt sig. Pharmacist says that "someone" the office had already called to give clarification.  Medication Instructions:  Increase the Metoprolol Succinate to 37.5mg  (1.5 tablet) .    If the palpitations persist take 50mg  (one whole tablet) . Take once a day.

## 2023-09-19 ENCOUNTER — Encounter (HOSPITAL_BASED_OUTPATIENT_CLINIC_OR_DEPARTMENT_OTHER)
Admission: RE | Disposition: A | Discharge status: Home / Self Care | Payer: Self-pay | Source: Home / Self Care | Attending: General Surgery

## 2023-09-19 ENCOUNTER — Encounter (HOSPITAL_BASED_OUTPATIENT_CLINIC_OR_DEPARTMENT_OTHER): Payer: Self-pay | Admitting: Certified Registered"

## 2023-09-19 ENCOUNTER — Ambulatory Visit (HOSPITAL_BASED_OUTPATIENT_CLINIC_OR_DEPARTMENT_OTHER)
Admission: RE | Admit: 2023-09-19 | Discharge: 2023-09-19 | Disposition: A | Discharge status: Home / Self Care | Payer: Medicare PPO | Attending: General Surgery | Admitting: General Surgery

## 2023-09-19 ENCOUNTER — Encounter (HOSPITAL_BASED_OUTPATIENT_CLINIC_OR_DEPARTMENT_OTHER): Payer: Self-pay | Admitting: General Surgery

## 2023-09-19 DIAGNOSIS — J45909 Unspecified asthma, uncomplicated: Secondary | ICD-10-CM | POA: Insufficient documentation

## 2023-09-19 DIAGNOSIS — N6082 Other benign mammary dysplasias of left breast: Secondary | ICD-10-CM | POA: Diagnosis not present

## 2023-09-19 DIAGNOSIS — F419 Anxiety disorder, unspecified: Secondary | ICD-10-CM | POA: Diagnosis not present

## 2023-09-19 DIAGNOSIS — Z803 Family history of malignant neoplasm of breast: Secondary | ICD-10-CM | POA: Diagnosis not present

## 2023-09-19 DIAGNOSIS — G4730 Sleep apnea, unspecified: Secondary | ICD-10-CM | POA: Diagnosis not present

## 2023-09-19 DIAGNOSIS — Z5309 Procedure and treatment not carried out because of other contraindication: Secondary | ICD-10-CM | POA: Insufficient documentation

## 2023-09-19 DIAGNOSIS — Z79899 Other long term (current) drug therapy: Secondary | ICD-10-CM | POA: Insufficient documentation

## 2023-09-19 DIAGNOSIS — Z7982 Long term (current) use of aspirin: Secondary | ICD-10-CM | POA: Diagnosis not present

## 2023-09-19 SURGERY — EXCISION, LESION, TORSO
Anesthesia: LOCAL | Laterality: Left

## 2023-09-19 MED ORDER — CEFAZOLIN SODIUM-DEXTROSE 2-4 GM/100ML-% IV SOLN: 2.0000 g | INTRAVENOUS | Status: DC

## 2023-09-19 MED ORDER — ACETAMINOPHEN 500 MG PO TABS: 1000.0000 mg | ORAL_TABLET | Freq: Once | ORAL | Status: DC

## 2023-09-19 MED ORDER — ENSURE PRE-SURGERY PO LIQD
296.0000 mL | Freq: Once | ORAL | Status: DC
Start: 2023-09-20 — End: 2023-09-19

## 2023-09-19 MED ORDER — FENTANYL CITRATE (PF) 100 MCG/2ML IJ SOLN
INTRAMUSCULAR | Status: AC
Start: 2023-09-19 — End: ?
  Filled 2023-09-19: qty 2

## 2023-09-19 MED ORDER — CHLORHEXIDINE GLUCONATE CLOTH 2 % EX PADS
6.0000 | MEDICATED_PAD | Freq: Once | CUTANEOUS | Status: DC
Start: 2023-09-19 — End: 2023-09-19

## 2023-09-19 MED ORDER — ACETAMINOPHEN 500 MG PO TABS
1000.0000 mg | ORAL_TABLET | ORAL | Status: AC
  Administered 2023-09-19: 1000 mg via ORAL

## 2023-09-19 MED ORDER — PROPOFOL 1000 MG/100ML IV EMUL
INTRAVENOUS | Status: AC
  Filled 2023-09-19: qty 200

## 2023-09-19 MED ORDER — CHLORHEXIDINE GLUCONATE CLOTH 2 % EX PADS: 6.0000 | MEDICATED_PAD | Freq: Once | CUTANEOUS | Status: DC

## 2023-09-19 MED ORDER — ACETAMINOPHEN 500 MG PO TABS
ORAL_TABLET | ORAL | Status: AC
  Filled 2023-09-19: qty 2

## 2023-09-19 MED ORDER — BUPIVACAINE HCL (PF) 0.5 % IJ SOLN
INTRAMUSCULAR | Status: AC
  Filled 2023-09-19: qty 30

## 2023-09-19 MED ORDER — BUPIVACAINE HCL (PF) 0.25 % IJ SOLN
INTRAMUSCULAR | Status: AC
  Filled 2023-09-19: qty 90

## 2023-09-19 MED ORDER — LIDOCAINE HCL (PF) 1 % IJ SOLN
INTRAMUSCULAR | Status: AC
  Filled 2023-09-19: qty 60

## 2023-09-19 MED ORDER — PROPOFOL 500 MG/50ML IV EMUL
INTRAVENOUS | Status: AC
  Filled 2023-09-19: qty 100

## 2023-09-19 SURGICAL SUPPLY — 39 items
BLADE CLIPPER SURG (BLADE) IMPLANT
BLADE SURG 15 STRL LF DISP TIS (BLADE) ×1 IMPLANT
CANISTER SUCT 1200ML W/VALVE (MISCELLANEOUS) IMPLANT
CHLORAPREP W/TINT 26 (MISCELLANEOUS) ×1 IMPLANT
CLSR STERI-STRIP ANTIMIC 1/2X4 (GAUZE/BANDAGES/DRESSINGS) ×1 IMPLANT
COVER BACK TABLE 60X90IN (DRAPES) ×1 IMPLANT
COVER MAYO STAND STRL (DRAPES) ×1 IMPLANT
DERMABOND ADVANCED .7 DNX12 (GAUZE/BANDAGES/DRESSINGS) ×1 IMPLANT
DRAPE LAPAROTOMY 100X72 PEDS (DRAPES) ×1 IMPLANT
DRAPE UTILITY XL STRL (DRAPES) ×1 IMPLANT
DRSG TEGADERM 4X4.75 (GAUZE/BANDAGES/DRESSINGS) IMPLANT
ELECT COATED BLADE 2.86 ST (ELECTRODE) IMPLANT
ELECT REM PT RETURN 9FT ADLT (ELECTROSURGICAL) ×1 IMPLANT
GAUZE PACKING IODOFORM 1/4X15 (PACKING) IMPLANT
GAUZE SPONGE 4X4 12PLY STRL LF (GAUZE/BANDAGES/DRESSINGS) IMPLANT
GLOVE BIO SURGEON STRL SZ7 (GLOVE) ×1 IMPLANT
GLOVE BIOGEL PI IND STRL 7.5 (GLOVE) ×1 IMPLANT
GOWN STRL REUS W/ TWL LRG LVL3 (GOWN DISPOSABLE) ×3 IMPLANT
KIT MARKER MARGIN INK (KITS) IMPLANT
NEEDLE HYPO 25X1 1.5 SAFETY (NEEDLE) ×1 IMPLANT
NS IRRIG 1000ML POUR BTL (IV SOLUTION) IMPLANT
PACK BASIN DAY SURGERY FS (CUSTOM PROCEDURE TRAY) ×1 IMPLANT
PENCIL SMOKE EVACUATOR (MISCELLANEOUS) ×1 IMPLANT
SLEEVE SCD COMPRESS KNEE MED (STOCKING) ×1 IMPLANT
SPIKE FLUID TRANSFER (MISCELLANEOUS) IMPLANT
SPONGE T-LAP 4X18 ~~LOC~~+RFID (SPONGE) ×1 IMPLANT
SUT ETHILON 2 0 FS 18 (SUTURE) IMPLANT
SUT MNCRL AB 4-0 PS2 18 (SUTURE) ×1 IMPLANT
SUT SILK 2 0 SH (SUTURE) IMPLANT
SUT VIC AB 2-0 SH 27XBRD (SUTURE) IMPLANT
SUT VIC AB 4-0 PS2 18 (SUTURE) IMPLANT
SUT VICRYL 3-0 CR8 SH (SUTURE) IMPLANT
SWAB COLLECTION DEVICE MRSA (MISCELLANEOUS) IMPLANT
SWAB CULTURE ESWAB REG 1ML (MISCELLANEOUS) IMPLANT
SYR CONTROL 10ML LL (SYRINGE) ×1 IMPLANT
TOWEL GREEN STERILE FF (TOWEL DISPOSABLE) ×1 IMPLANT
TUBE CONNECTING 20X1/4 (TUBING) IMPLANT
UNDERPAD 30X36 HEAVY ABSORB (UNDERPADS AND DIAPERS) IMPLANT
YANKAUER SUCT BULB TIP NO VENT (SUCTIONS) IMPLANT

## 2023-09-19 NOTE — H&P (Signed)
  73 year old female who has a prior history of a right lumpectomy and sentinel node biopsy in 2015 followed by chemo and radiation. She has a negative mammogram in March 2024. She was seen in our office in early February with a small lump that been present for a couple of years in the left lateral IM fold. This has become more sore. She had some drainage associated with this. She was seen and had this area probed and was on antibiotics. This is gotten much better but still has a mass that persistent looks to be consistent with a sebaceous cyst.  Review of Systems: A complete review of systems was obtained from the patient. I have reviewed this information and discussed as appropriate with the patient. See HPI as well for other ROS.  Review of Systems  All other systems reviewed and are negative.  Medical History: Past Medical History:  Diagnosis Date  Anemia  Anxiety  Arrhythmia  Arthritis  Asthma, unspecified asthma severity, unspecified whether complicated, unspecified whether persistent (HHS-HCC)  GERD (gastroesophageal reflux disease)  History of cancer  Hyperlipidemia  Sleep apnea   Past Surgical History:  Procedure Laterality Date  HYSTERECTOMY  MASTECTOMY   Allergies  Allergen Reactions  Sulfa (Sulfonamide Antibiotics) Anaphylaxis, Nausea, Other (See Comments) and Shortness Of Breath  Sweating, headache   Current Outpatient Medications on File Prior to Visit  Medication Sig Dispense Refill  albuterol MDI, PROVENTIL, VENTOLIN, PROAIR, HFA 90 mcg/actuation inhaler Inhale 2 inhalations into the lungs every 6 (six) hours as needed  aspirin 81 MG EC tablet Take 81 mg by mouth once daily  cetirizine (ZYRTEC) 10 MG tablet Take 10 mg by mouth once daily  flecainide (TAMBOCOR) 100 MG tablet Take 0.5 tablets by mouth 2 (two) times daily  metoprolol TARTrate (LOPRESSOR) 25 MG tablet Take 1 tablet (25 mg) as needed for palpitations  PARoxetine (PAXIL) 20 MG tablet Take 1 tablet by  mouth once daily   Family History  Problem Relation Age of Onset  Skin cancer Mother  Obesity Mother  High blood pressure (Hypertension) Mother  High blood pressure (Hypertension) Father  Breast cancer Sister  Diabetes Brother    Social History   Tobacco Use  Smoking Status Never  Smokeless Tobacco Never  Marital status: Unknown  Tobacco Use  Smoking status: Never  Smokeless tobacco: Never  Substance and Sexual Activity  Alcohol use: Not Currently  Drug use: Never   Objective:   Vitals:   Physical Exam Vitals and nursing note reviewed.  Pulm effort normal Cv regular Chest:  Breasts: Left: No mass.  Comments: Small 1 cm IM fold lesion c/w sebaceous cyst Neurological:  Mental Status: She is alert.    Assessment and Plan:  Sebaceous cyst of breast, left  Excision of left breast sebaceous cyst.  I discussed excising the sebaceous cyst. I think this might be able to be done in the office but there is no way that this is going to occur. We discussed doing at one of the surgery centers under local anesthesia with some sedation. We discussed excision as well as the risks and recovery associated with it. I will plan to schedule this after she has completed her mammogram.

## 2023-09-19 NOTE — Progress Notes (Signed)
 Pt anesthesia ordered as "local" but surgeon needs "MAC." Pt ate breakfast so unable to change anesthesia today. Surgery cancelled by anesthesia.

## 2023-09-19 NOTE — H&P (View-Only) (Signed)
  73 year old female who has a prior history of a right lumpectomy and sentinel node biopsy in 2015 followed by chemo and radiation. She has a negative mammogram in March 2024. She was seen in our office in early February with a small lump that been present for a couple of years in the left lateral IM fold. This has become more sore. She had some drainage associated with this. She was seen and had this area probed and was on antibiotics. This is gotten much better but still has a mass that persistent looks to be consistent with a sebaceous cyst.  Review of Systems: A complete review of systems was obtained from the patient. I have reviewed this information and discussed as appropriate with the patient. See HPI as well for other ROS.  Review of Systems  All other systems reviewed and are negative.  Medical History: Past Medical History:  Diagnosis Date  Anemia  Anxiety  Arrhythmia  Arthritis  Asthma, unspecified asthma severity, unspecified whether complicated, unspecified whether persistent (HHS-HCC)  GERD (gastroesophageal reflux disease)  History of cancer  Hyperlipidemia  Sleep apnea   Past Surgical History:  Procedure Laterality Date  HYSTERECTOMY  MASTECTOMY   Allergies  Allergen Reactions  Sulfa (Sulfonamide Antibiotics) Anaphylaxis, Nausea, Other (See Comments) and Shortness Of Breath  Sweating, headache   Current Outpatient Medications on File Prior to Visit  Medication Sig Dispense Refill  albuterol MDI, PROVENTIL, VENTOLIN, PROAIR, HFA 90 mcg/actuation inhaler Inhale 2 inhalations into the lungs every 6 (six) hours as needed  aspirin 81 MG EC tablet Take 81 mg by mouth once daily  cetirizine (ZYRTEC) 10 MG tablet Take 10 mg by mouth once daily  flecainide (TAMBOCOR) 100 MG tablet Take 0.5 tablets by mouth 2 (two) times daily  metoprolol TARTrate (LOPRESSOR) 25 MG tablet Take 1 tablet (25 mg) as needed for palpitations  PARoxetine (PAXIL) 20 MG tablet Take 1 tablet by  mouth once daily   Family History  Problem Relation Age of Onset  Skin cancer Mother  Obesity Mother  High blood pressure (Hypertension) Mother  High blood pressure (Hypertension) Father  Breast cancer Sister  Diabetes Brother    Social History   Tobacco Use  Smoking Status Never  Smokeless Tobacco Never  Marital status: Unknown  Tobacco Use  Smoking status: Never  Smokeless tobacco: Never  Substance and Sexual Activity  Alcohol use: Not Currently  Drug use: Never   Objective:   Vitals:   Physical Exam Vitals and nursing note reviewed.  Pulm effort normal Cv regular Chest:  Breasts: Left: No mass.  Comments: Small 1 cm IM fold lesion c/w sebaceous cyst Neurological:  Mental Status: She is alert.    Assessment and Plan:  Sebaceous cyst of breast, left  Excision of left breast sebaceous cyst.  I discussed excising the sebaceous cyst. I think this might be able to be done in the office but there is no way that this is going to occur. We discussed doing at one of the surgery centers under local anesthesia with some sedation. We discussed excision as well as the risks and recovery associated with it. I will plan to schedule this after she has completed her mammogram.

## 2023-09-19 NOTE — Telephone Encounter (Signed)
 Hey Anitra, the medication prescribed is metoprolol 25 mg tablets, if pt has palpitations pt has to take 2 tablets to equal 50 mg, that's why the pharmacy as asking for clarification, because pt can't take 1 whole tablet to equal 50 mg. Please address

## 2023-09-19 NOTE — Interval H&P Note (Signed)
 History and Physical Interval Note:  09/19/2023 7:10 AM  Tami [name]  has presented today for surgery, with the diagnosis of LEFT BREAST SEBACEOUS CYST.  The various methods of treatment have been discussed with the patient and family. After consideration of risks, benefits and other options for treatment, the patient has consented to  Procedure(s) with comments: EXCISION, LESION, TORSO (Left) - LOCAL WITH SOME SEDATION (MAC) excision of left breast sebaceous cyst as a surgical intervention.  The patient's history has been reviewed, patient examined, no change in status, stable for surgery.  I have reviewed the patient's chart and labs.  Questions were answered to the patient's satisfaction.     Emelia Loron

## 2023-09-20 NOTE — Telephone Encounter (Signed)
 Spoke with patient. She has more than enough medication for now. She is currently taking 1.5 at night. She attest to not having any palpitations and she is feeling great! Asking that you type the sig of how you want this medication to be taken so that I may copy and paste it for pharmacist clarification.

## 2023-09-20 NOTE — Telephone Encounter (Signed)
 Hi Cathy,  When I spoke to the pharmacist he told me that someone had already called at clarified the sig for him and all was well for him to fill. Thank you for letting me know. I will confirm again with the doctor and then submit another rx once he clarifies. I will call the patient to make sure they have enough medication for the time being.

## 2023-09-21 ENCOUNTER — Telehealth: Payer: Self-pay | Admitting: Hematology and Oncology

## 2023-09-25 NOTE — Anesthesia Preprocedure Evaluation (Signed)
 Anesthesia Evaluation  Patient identified by MRN, date of birth, ID band Patient awake    Reviewed: Allergy & Precautions, NPO status , Patient's Chart, lab work & pertinent test results, reviewed documented beta blocker date and time   Airway Mallampati: III  TM Distance: >3 FB     Dental  (+) Teeth Intact, Dental Advisory Given, Caps,    Pulmonary asthma    Pulmonary exam normal breath sounds clear to auscultation       Cardiovascular hypertension, Pt. on medications and Pt. on home beta blockers Normal cardiovascular exam+ dysrhythmias Atrial Fibrillation and Supra Ventricular Tachycardia  Rhythm:Regular Rate:Normal     Neuro/Psych  Headaches PSYCHIATRIC DISORDERS Anxiety Depression     Neuromuscular disease    GI/Hepatic Neg liver ROS, PUD,GERD  Medicated,,IBS   Endo/Other  negative endocrine ROS  Hx/o right breast Ca S/P mastectomy, ChemoRx and RT Pre diabetes Hyperlipidemia  Renal/GU Renal disease  negative genitourinary   Musculoskeletal  (+) Arthritis , Osteoarthritis,  Fibromyalgia -Sebaceous cyst left breast Osteopenia   Abdominal   Peds  Hematology  (+) Blood dyscrasia, anemia   Anesthesia Other Findings   Reproductive/Obstetrics                             Anesthesia Physical Anesthesia Plan  ASA: 3  Anesthesia Plan: MAC   Post-op Pain Management: Minimal or no pain anticipated   Induction: Intravenous  PONV Risk Score and Plan: 4 or greater and Treatment may vary due to age or medical condition  Airway Management Planned: Natural Airway and Simple Face Mask  Additional Equipment: None  Intra-op Plan:   Post-operative Plan: Extubation in OR  Informed Consent: I have reviewed the patients History and Physical, chart, labs and discussed the procedure including the risks, benefits and alternatives for the proposed anesthesia with the patient or authorized  representative who has indicated his/her understanding and acceptance.     Dental advisory given  Plan Discussed with: CRNA and Anesthesiologist  Anesthesia Plan Comments:         Anesthesia Quick Evaluation

## 2023-09-26 ENCOUNTER — Other Ambulatory Visit: Payer: Self-pay

## 2023-09-26 ENCOUNTER — Ambulatory Visit (HOSPITAL_BASED_OUTPATIENT_CLINIC_OR_DEPARTMENT_OTHER): Payer: Self-pay | Admitting: Anesthesiology

## 2023-09-26 ENCOUNTER — Encounter (HOSPITAL_BASED_OUTPATIENT_CLINIC_OR_DEPARTMENT_OTHER): Payer: Self-pay | Admitting: General Surgery

## 2023-09-26 ENCOUNTER — Encounter (HOSPITAL_BASED_OUTPATIENT_CLINIC_OR_DEPARTMENT_OTHER)
Admission: RE | Disposition: A | Discharge status: Home / Self Care | Payer: Self-pay | Source: Home / Self Care | Attending: General Surgery

## 2023-09-26 ENCOUNTER — Ambulatory Visit (HOSPITAL_BASED_OUTPATIENT_CLINIC_OR_DEPARTMENT_OTHER)
Admission: RE | Admit: 2023-09-26 | Discharge: 2023-09-26 | Disposition: A | Discharge status: Home / Self Care | Attending: General Surgery | Admitting: General Surgery

## 2023-09-26 DIAGNOSIS — N6082 Other benign mammary dysplasias of left breast: Secondary | ICD-10-CM | POA: Diagnosis not present

## 2023-09-26 DIAGNOSIS — I10 Essential (primary) hypertension: Secondary | ICD-10-CM | POA: Insufficient documentation

## 2023-09-26 DIAGNOSIS — I480 Paroxysmal atrial fibrillation: Secondary | ICD-10-CM | POA: Diagnosis not present

## 2023-09-26 DIAGNOSIS — Z79899 Other long term (current) drug therapy: Secondary | ICD-10-CM | POA: Insufficient documentation

## 2023-09-26 DIAGNOSIS — L723 Sebaceous cyst: Secondary | ICD-10-CM | POA: Insufficient documentation

## 2023-09-26 DIAGNOSIS — N6002 Solitary cyst of left breast: Secondary | ICD-10-CM | POA: Diagnosis not present

## 2023-09-26 DIAGNOSIS — F419 Anxiety disorder, unspecified: Secondary | ICD-10-CM | POA: Diagnosis not present

## 2023-09-26 DIAGNOSIS — I4891 Unspecified atrial fibrillation: Secondary | ICD-10-CM | POA: Diagnosis not present

## 2023-09-26 DIAGNOSIS — Z9011 Acquired absence of right breast and nipple: Secondary | ICD-10-CM | POA: Diagnosis not present

## 2023-09-26 DIAGNOSIS — L918 Other hypertrophic disorders of the skin: Secondary | ICD-10-CM | POA: Insufficient documentation

## 2023-09-26 DIAGNOSIS — Z853 Personal history of malignant neoplasm of breast: Secondary | ICD-10-CM | POA: Insufficient documentation

## 2023-09-26 DIAGNOSIS — Z923 Personal history of irradiation: Secondary | ICD-10-CM | POA: Diagnosis not present

## 2023-09-26 DIAGNOSIS — I471 Supraventricular tachycardia, unspecified: Secondary | ICD-10-CM | POA: Diagnosis not present

## 2023-09-26 DIAGNOSIS — F418 Other specified anxiety disorders: Secondary | ICD-10-CM | POA: Diagnosis not present

## 2023-09-26 DIAGNOSIS — F32A Depression, unspecified: Secondary | ICD-10-CM | POA: Diagnosis not present

## 2023-09-26 DIAGNOSIS — Z9221 Personal history of antineoplastic chemotherapy: Secondary | ICD-10-CM | POA: Insufficient documentation

## 2023-09-26 DIAGNOSIS — J45909 Unspecified asthma, uncomplicated: Secondary | ICD-10-CM | POA: Diagnosis not present

## 2023-09-26 HISTORY — PX: LESION REMOVAL: SHX5196

## 2023-09-26 SURGERY — EXCISION, LESION, TORSO
Anesthesia: Monitor Anesthesia Care | Site: Breast | Laterality: Left

## 2023-09-26 MED ORDER — ACETAMINOPHEN 500 MG PO TABS
ORAL_TABLET | ORAL | Status: AC
  Filled 2023-09-26: qty 1

## 2023-09-26 MED ORDER — LIDOCAINE HCL (CARDIAC) PF 100 MG/5ML IV SOSY
PREFILLED_SYRINGE | INTRAVENOUS | Status: DC | PRN
  Administered 2023-09-26: 20 mg via INTRAVENOUS

## 2023-09-26 MED ORDER — ONDANSETRON HCL 4 MG/2ML IJ SOLN
INTRAMUSCULAR | Status: DC | PRN
  Administered 2023-09-26: 4 mg via INTRAVENOUS

## 2023-09-26 MED ORDER — OXYCODONE HCL 5 MG PO TABS: 5.0000 mg | ORAL_TABLET | Freq: Once | ORAL | Status: DC | PRN

## 2023-09-26 MED ORDER — LIDOCAINE 2% (20 MG/ML) 5 ML SYRINGE
INTRAMUSCULAR | Status: AC
  Filled 2023-09-26: qty 15

## 2023-09-26 MED ORDER — FENTANYL CITRATE (PF) 100 MCG/2ML IJ SOLN: 25.0000 ug | INTRAMUSCULAR | Status: DC | PRN

## 2023-09-26 MED ORDER — LIDOCAINE HCL (PF) 1 % IJ SOLN
INTRAMUSCULAR | Status: AC
  Filled 2023-09-26: qty 30

## 2023-09-26 MED ORDER — LIDOCAINE HCL (PF) 1 % IJ SOLN
INTRAMUSCULAR | Status: DC | PRN
  Administered 2023-09-26: 11 mL

## 2023-09-26 MED ORDER — FENTANYL CITRATE (PF) 100 MCG/2ML IJ SOLN
INTRAMUSCULAR | Status: DC | PRN
  Administered 2023-09-26: 25 ug via INTRAVENOUS

## 2023-09-26 MED ORDER — ACETAMINOPHEN 500 MG PO TABS
1000.0000 mg | ORAL_TABLET | Freq: Once | ORAL | Status: AC
  Administered 2023-09-26: 1000 mg via ORAL

## 2023-09-26 MED ORDER — FENTANYL CITRATE (PF) 100 MCG/2ML IJ SOLN
INTRAMUSCULAR | Status: AC
Start: 2023-09-26 — End: ?
  Filled 2023-09-26: qty 2

## 2023-09-26 MED ORDER — ACETAMINOPHEN 500 MG PO TABS
ORAL_TABLET | ORAL | Status: AC
  Filled 2023-09-26: qty 2

## 2023-09-26 MED ORDER — CEFAZOLIN SODIUM-DEXTROSE 2-4 GM/100ML-% IV SOLN
INTRAVENOUS | Status: AC
Start: 2023-09-26 — End: ?
  Filled 2023-09-26: qty 100

## 2023-09-26 MED ORDER — OXYCODONE HCL 5 MG/5ML PO SOLN: 5.0000 mg | Freq: Once | ORAL | Status: DC | PRN

## 2023-09-26 MED ORDER — CEFAZOLIN SODIUM-DEXTROSE 2-4 GM/100ML-% IV SOLN
2.0000 g | Freq: Once | INTRAVENOUS | Status: AC
  Administered 2023-09-26: 2 g via INTRAVENOUS

## 2023-09-26 MED ORDER — PROPOFOL 500 MG/50ML IV EMUL
INTRAVENOUS | Status: AC
Start: 2023-09-26 — End: ?
  Filled 2023-09-26: qty 100

## 2023-09-26 MED ORDER — ONDANSETRON HCL 4 MG/2ML IJ SOLN
INTRAMUSCULAR | Status: AC
  Filled 2023-09-26: qty 4

## 2023-09-26 MED ORDER — PROPOFOL 500 MG/50ML IV EMUL
INTRAVENOUS | Status: DC | PRN
  Administered 2023-09-26: 150 ug/kg/min via INTRAVENOUS

## 2023-09-26 MED ORDER — LACTATED RINGERS IV SOLN: INTRAVENOUS | Status: DC

## 2023-09-26 MED ORDER — ONDANSETRON HCL 4 MG/2ML IJ SOLN: 4.0000 mg | Freq: Once | INTRAMUSCULAR | Status: DC | PRN

## 2023-09-26 MED ORDER — DEXMEDETOMIDINE HCL IN NACL 80 MCG/20ML IV SOLN
INTRAVENOUS | Status: AC
  Filled 2023-09-26: qty 40

## 2023-09-26 MED ORDER — BUPIVACAINE HCL (PF) 0.25 % IJ SOLN
INTRAMUSCULAR | Status: AC
  Filled 2023-09-26: qty 30

## 2023-09-26 MED ORDER — DROPERIDOL 2.5 MG/ML IJ SOLN: 0.6250 mg | Freq: Once | INTRAMUSCULAR | Status: DC | PRN

## 2023-09-26 SURGICAL SUPPLY — 44 items
BLADE CLIPPER SURG (BLADE) IMPLANT
BLADE SURG 15 STRL LF DISP TIS (BLADE) ×1 IMPLANT
CANISTER SUCT 1200ML W/VALVE (MISCELLANEOUS) IMPLANT
CHLORAPREP W/TINT 26 (MISCELLANEOUS) ×1 IMPLANT
CLSR STERI-STRIP ANTIMIC 1/2X4 (GAUZE/BANDAGES/DRESSINGS) ×1 IMPLANT
COVER BACK TABLE 60X90IN (DRAPES) ×1 IMPLANT
COVER MAYO STAND STRL (DRAPES) ×1 IMPLANT
DERMABOND ADVANCED .7 DNX12 (GAUZE/BANDAGES/DRESSINGS) ×1 IMPLANT
DRAPE LAPAROTOMY 100X72 PEDS (DRAPES) ×1 IMPLANT
DRAPE UTILITY XL STRL (DRAPES) ×1 IMPLANT
DRSG TEGADERM 4X4.75 (GAUZE/BANDAGES/DRESSINGS) IMPLANT
ELECT COATED BLADE 2.86 ST (ELECTRODE) IMPLANT
ELECT REM PT RETURN 9FT ADLT (ELECTROSURGICAL) ×1 IMPLANT
ELECTRODE REM PT RTRN 9FT ADLT (ELECTROSURGICAL) ×1 IMPLANT
GAUZE PACKING IODOFORM 1/4X15 (PACKING) IMPLANT
GAUZE SPONGE 4X4 12PLY STRL LF (GAUZE/BANDAGES/DRESSINGS) IMPLANT
GLOVE BIO SURGEON STRL SZ7 (GLOVE) ×1 IMPLANT
GLOVE BIOGEL PI IND STRL 7.0 (GLOVE) IMPLANT
GLOVE BIOGEL PI IND STRL 7.5 (GLOVE) ×1 IMPLANT
GLOVE SURG SS PI 7.0 STRL IVOR (GLOVE) IMPLANT
GOWN STRL REUS W/ TWL LRG LVL3 (GOWN DISPOSABLE) ×3 IMPLANT
KIT MARKER MARGIN INK (KITS) IMPLANT
NDL HYPO 25X1 1.5 SAFETY (NEEDLE) ×1 IMPLANT
NEEDLE HYPO 25X1 1.5 SAFETY (NEEDLE) ×1 IMPLANT
NS IRRIG 1000ML POUR BTL (IV SOLUTION) IMPLANT
PACK BASIN DAY SURGERY FS (CUSTOM PROCEDURE TRAY) ×1 IMPLANT
PENCIL SMOKE EVACUATOR (MISCELLANEOUS) ×1 IMPLANT
SLEEVE SCD COMPRESS KNEE MED (STOCKING) ×1 IMPLANT
SPIKE FLUID TRANSFER (MISCELLANEOUS) IMPLANT
SPONGE T-LAP 4X18 ~~LOC~~+RFID (SPONGE) ×1 IMPLANT
SUT ETHILON 2 0 FS 18 (SUTURE) IMPLANT
SUT MNCRL AB 4-0 PS2 18 (SUTURE) ×1 IMPLANT
SUT SILK 2 0 SH (SUTURE) IMPLANT
SUT VIC AB 2-0 SH 27XBRD (SUTURE) IMPLANT
SUT VIC AB 3-0 SH 27X BRD (SUTURE) IMPLANT
SUT VIC AB 4-0 PS2 18 (SUTURE) IMPLANT
SUT VICRYL 3-0 CR8 SH (SUTURE) IMPLANT
SWAB COLLECTION DEVICE MRSA (MISCELLANEOUS) IMPLANT
SWAB CULTURE ESWAB REG 1ML (MISCELLANEOUS) IMPLANT
SYR CONTROL 10ML LL (SYRINGE) ×1 IMPLANT
TOWEL GREEN STERILE FF (TOWEL DISPOSABLE) ×1 IMPLANT
TUBE CONNECTING 20X1/4 (TUBING) IMPLANT
UNDERPAD 30X36 HEAVY ABSORB (UNDERPADS AND DIAPERS) IMPLANT
YANKAUER SUCT BULB TIP NO VENT (SUCTIONS) IMPLANT

## 2023-09-26 NOTE — Transfer of Care (Signed)
 Immediate Anesthesia Transfer of Care Note  Patient: Tami [name]  Procedure(s) Performed: EXCISION LEFT BREAST SEBACEOUS CYST (Left: Breast)  Patient Location: PACU  Anesthesia Type:MAC  Level of Consciousness: awake, alert , and patient cooperative  Airway & Oxygen Therapy: Patient Spontanous Breathing and Patient connected to face mask oxygen  Post-op Assessment: Report given to RN and Post -op Vital signs reviewed and stable  Post vital signs: Reviewed and stable  Last Vitals:  Vitals Value Taken Time  BP 149/63 09/26/23 0804  Temp    Pulse 51 09/26/23 0805  Resp 14 09/26/23 0805  SpO2 100 % 09/26/23 0805  Vitals shown include unfiled device data.  Last Pain:  Vitals:   09/26/23 0643  TempSrc: Oral  PainSc: 0-No pain      Patients Stated Pain Goal: 4 (09/26/23 4098)  Complications: No notable events documented.

## 2023-09-26 NOTE — Discharge Instructions (Addendum)
 [facility] Office Phone Number [phone]  POST OP INSTRUCTIONS Take 400 mg of ibuprofen every 8 hours or 650 mg tylenol every 6 hours for next 72 hours then as needed. May take next dose Tylenol at 1;20p. Use ice several times daily also.  Take your usually prescribed medications unless otherwise directed You should eat light the day of surgery, such as soup, crackers, pudding, etc.  Resume your normal diet the day after surgery. Most patients will experience some swelling and bruising.  Ice packs and a good support bra will help.  Swelling and bruising can take several days to resolve.  It is common to experience some constipation if taking pain medication after surgery.  Increasing fluid intake and taking a stool softener will usually help or prevent this problem from occurring.  A mild laxative (Milk of Magnesia or Miralax) should be taken according to package directions if there are no bowel movements after 48 hours. I used skin glue on the incision, you may shower in 24 hours.  The glue will flake off over the next 2-3 weeks.  Any sutures or staples will be removed at the office during your follow-up visit. ACTIVITIES:  You may resume regular daily activities (gradually increasing) beginning the next day.  Wearing a good support bra or sports bra minimizes pain and swelling.   You may drive when you no longer are taking prescription pain medication, you can comfortably wear a seatbelt, and you can safely maneuver your car and apply brakes. RETURN TO WORK:  ______________________________________________________________________________________ Bonita Quin should see your doctor in the office for a follow-up appointment approximately two weeks after your surgery.  Your doctor's nurse will typically make your follow-up appointment when she calls you with your pathology report.  Expect your pathology report 3-4 business days after your surgery.  You may call to check if you do not hear from  [place] after three days. OTHER INSTRUCTIONS: _______________________________________________________________________________________________ _____________________________________________________________________________________________________________________________________ _____________________________________________________________________________________________________________________________________ _____________________________________________________________________________________________________________________________________  WHEN TO CALL DR WAKEFIELD: Fever over 101.0 Nausea and/or vomiting. Extreme swelling or bruising. Continued bleeding from incision. Increased pain, redness, or drainage from the incision.  The clinic staff is available to answer your questions during regular business hours.  Please don't hesitate to call and ask to speak to one of the nurses for clinical concerns.  If you have a medical emergency, go to the nearest emergency room or call 911.  A surgeon from [facility] is always on call at the hospital.  For further questions, please visit [email or web address]   Post Anesthesia Home Care Instructions  Activity: Get plenty of rest for the remainder of the day. A responsible individual must stay with you for 24 hours following the procedure.  For the next 24 hours, DO NOT: -Drive a car -Advertising copywriter -Drink alcoholic beverages -Take any medication unless instructed by your physician -Make any legal decisions or sign important papers.  Meals: Start with liquid foods such as gelatin or soup. Progress to regular foods as tolerated. Avoid greasy, spicy, heavy foods. If nausea and/or vomiting occur, drink only clear liquids until the nausea and/or vomiting subsides. Call your physician if vomiting continues.  Special Instructions/Symptoms: Your throat may feel dry or sore from the anesthesia or the breathing tube placed in your throat  during surgery. If this causes discomfort, gargle with warm salt water. The discomfort should disappear within 24 hours.  If you had a scopolamine patch placed behind your ear for the management of post- operative nausea and/or vomiting:  1. The medication in the patch is effective for 72 hours, after which it should be removed.  Wrap patch in a tissue and discard in the trash. Wash hands thoroughly with soap and water. 2. You may remove the patch earlier than 72 hours if you experience unpleasant side effects which may include dry mouth, dizziness or visual disturbances. 3. Avoid touching the patch. Wash your hands with soap and water after contact with the patch.

## 2023-09-26 NOTE — Interval H&P Note (Signed)
 History and Physical Interval Note:  09/26/2023 7:12 AM  Tami [name]  has presented today for surgery, with the diagnosis of LEFT BREAST SEBACEOUS CYST.  The various methods of treatment have been discussed with the patient and family. After consideration of risks, benefits and other options for treatment, the patient has consented to  Procedure(s) with comments: EXCISION, LESION, TORSO (Left) - LOCAL WITH SOME SEDATION (MAC) excision of left breast sebaceous cyst as a surgical intervention.  The patient's history has been reviewed, patient examined, no change in status, stable for surgery.  I have reviewed the patient's chart and labs.  Questions were answered to the patient's satisfaction.     Emelia Loron

## 2023-09-26 NOTE — Anesthesia Postprocedure Evaluation (Signed)
 Anesthesia Post Note  Patient: Tami [name]  Procedure(s) Performed: EXCISION LEFT BREAST SEBACEOUS CYST (Left: Breast)     Patient location during evaluation: PACU Anesthesia Type: MAC Level of consciousness: awake and alert and oriented Pain management: pain level controlled Vital Signs Assessment: post-procedure vital signs reviewed and stable Respiratory status: spontaneous breathing, nonlabored ventilation and respiratory function stable Cardiovascular status: stable and blood pressure returned to baseline Postop Assessment: no apparent nausea or vomiting Anesthetic complications: no   No notable events documented.  Last Vitals:  Vitals:   09/26/23 0815 09/26/23 0830  BP: (!) 159/74 (!) 168/81  Pulse: (!) 50 (!) 50  Resp: 12 13  Temp:    SpO2: 96% 98%    Last Pain:  Vitals:   09/26/23 0830  TempSrc:   PainSc: 0-No pain                 Toyna Erisman A.

## 2023-09-26 NOTE — Op Note (Addendum)
 Preoperative diagnosis: Left breast sebaceous cyst, 3x1 cm Postoperative diagnosis: Same as above Procedure: Left breast sebaceous cyst excision Surgeon: Dr. Harden Mo Anesthesia: Local with sedation Complications: None Estimated blood loss: Minimal Specimens: Left breast sebaceous cyst with overlying skin to pathology Disposition recovery stable   Indications: This is a 73 year old female has a prior history of a right breast cancer that was treated.  She has a negative mammogram.  She has a small mass in the left lateral inframammary fold.  This appears consistent with a sebaceous cyst.  We discussed excision.  Procedure: After informed consent was obtained she was taken to the operating room.  She was given cefazolin.  SCDs were in place.  She was placed under monitored anesthesia care.  She was prepped and draped in a standard sterile surgical fashion.  A surgical timeout was then performed.  She and I identified the area prior to beginning.  There was a skin tag next to it that she also wanted to remove that I included in the elliptical incision to encompass both of those as well as the skin connection of the sebaceous cyst.  I then removed all of this with a combination of the knife and cautery.  This was passed off the table.  Hemostasis was obtained.  I closed the dermis with 3-0 Vicryl and the skin with 4 Monocryl.  Glue and Steri-Strips were applied.  She tolerated this well was transferred to recovery stable.

## 2023-09-27 ENCOUNTER — Encounter (HOSPITAL_BASED_OUTPATIENT_CLINIC_OR_DEPARTMENT_OTHER): Payer: Self-pay | Admitting: General Surgery

## 2023-09-27 LAB — SURGICAL PATHOLOGY

## 2023-09-29 ENCOUNTER — Ambulatory Visit: Payer: TRICARE For Life (TFL) | Admitting: Hematology and Oncology

## 2023-10-05 DIAGNOSIS — R7309 Other abnormal glucose: Secondary | ICD-10-CM | POA: Diagnosis not present

## 2023-10-05 DIAGNOSIS — E7800 Pure hypercholesterolemia, unspecified: Secondary | ICD-10-CM | POA: Diagnosis not present

## 2023-10-05 DIAGNOSIS — R946 Abnormal results of thyroid function studies: Secondary | ICD-10-CM | POA: Diagnosis not present

## 2023-10-05 DIAGNOSIS — E559 Vitamin D deficiency, unspecified: Secondary | ICD-10-CM | POA: Diagnosis not present

## 2023-10-05 DIAGNOSIS — I480 Paroxysmal atrial fibrillation: Secondary | ICD-10-CM | POA: Diagnosis not present

## 2023-10-06 ENCOUNTER — Inpatient Hospital Stay: Admitting: Hematology and Oncology

## 2023-10-06 NOTE — Assessment & Plan Note (Deleted)
 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 status post lumpectomy, Oncotype DX recurrence score 29 intermediate risk 19% risk of recurrence with hormonal therapy alone   Chemotherapy summary: Taxotere and Cytoxan 4 started 05/07/2014 completed 07/09/2014 Long-term chemotherapy toxicities: Neuropathy Radiation therapy: Started 08/07/2014 completed 09/16/14 ----------------------------------------------------------------------------  Current treatment: Anastrozole 1 mg daily started 10/04/14 switched to letrozole 09/16/2015, switched to exemestane 06/30/2018 Patient took a 24-month break from antiestrogen therapy and she resumed 06/30/2018.  We tried a half a tablet but because of breaking unevenly she decided to stay on the 1 tablet a day.  Discontinued February 2022    Breast Cancer Surveillance: 1. Breast exam 10/06/23: Benign, extremely tender to palpation but no lumps or nodules. 2. Mammogram 09/12/23: Benign. Postsurgical changes. Breast Density Category B.    Return to clinic in 1 year for follow-up

## 2023-10-12 DIAGNOSIS — Z0000 Encounter for general adult medical examination without abnormal findings: Secondary | ICD-10-CM | POA: Diagnosis not present

## 2023-10-12 DIAGNOSIS — E7800 Pure hypercholesterolemia, unspecified: Secondary | ICD-10-CM | POA: Diagnosis not present

## 2023-10-12 DIAGNOSIS — J45909 Unspecified asthma, uncomplicated: Secondary | ICD-10-CM | POA: Diagnosis not present

## 2023-10-12 DIAGNOSIS — I480 Paroxysmal atrial fibrillation: Secondary | ICD-10-CM | POA: Diagnosis not present

## 2023-10-12 DIAGNOSIS — Z853 Personal history of malignant neoplasm of breast: Secondary | ICD-10-CM | POA: Diagnosis not present

## 2023-11-09 ENCOUNTER — Ambulatory Visit: Payer: Medicare PPO | Admitting: Cardiovascular Disease

## 2023-12-05 ENCOUNTER — Ambulatory Visit: Attending: Cardiovascular Disease | Admitting: Cardiovascular Disease

## 2023-12-05 ENCOUNTER — Encounter: Payer: Self-pay | Admitting: Cardiovascular Disease

## 2023-12-05 DIAGNOSIS — G4733 Obstructive sleep apnea (adult) (pediatric): Secondary | ICD-10-CM | POA: Diagnosis not present

## 2023-12-05 DIAGNOSIS — I480 Paroxysmal atrial fibrillation: Secondary | ICD-10-CM

## 2023-12-05 DIAGNOSIS — I471 Supraventricular tachycardia, unspecified: Secondary | ICD-10-CM | POA: Diagnosis not present

## 2023-12-05 DIAGNOSIS — E7800 Pure hypercholesterolemia, unspecified: Secondary | ICD-10-CM

## 2023-12-05 DIAGNOSIS — I10 Essential (primary) hypertension: Secondary | ICD-10-CM

## 2023-12-05 DIAGNOSIS — I440 Atrioventricular block, first degree: Secondary | ICD-10-CM | POA: Diagnosis not present

## 2023-12-05 NOTE — Progress Notes (Unsigned)
 Patient ID: Tami [name], female   DOB: [birth year], 73 y.o.   MRN: [id number]      HPI: [name] is a 73 year old African American female who is a former patient of Dr. Ed Gondola. She presents for a 3 month cardiology evaluation.  Tami [name] has a history of palpitations, paroxysmal supraventricular tachycardia as well as paroxysmal atrial fibrillation which have been detected on remote monitoring. She has been treated with low-dose flecainide  50 mg twice a day in addition to metoprolol  succinate 50 mg daily. She also has a history of hyperlipidemia in the past has had LDLs in the 145 range but is intolerant to several statins including Crestor . She also states she developed some myalgias secondary to Zetia . When she was last seen by Dr. Ed Gondola he had written her a prescription for livalo  1 mg but she never took this.  She also has continued problems with low back and hip discomfort and has some sciatica issues. She is followed by Dr. Carry Clapper. She also has undergone MRIs of her back.  An echo Doppler study in February 2012 showed normal systolic and diastolic function. She did not have significant valvular pathology. A nuclear perfusion study in 2010 which was normal.  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  I saw her prior to undergoing her lumpectomy and prior to radiation and chemotherapy.  A subsequent 2-D echo Doppler study on 03/28/2014 showed an ejection fraction at 60-65% and was normal.  She tolerated lumpectomy well and completed chemotherapy and radiation and  is on letrozole  for planned 7 years.   I had not seen her since 2017 and last saw her in April 2019.  He done well from a cardiac standpoint and denied any chest pain, palpitations, or palpitations.  I recently  saw her, she was experiencing poor sleep, admitted to snoring and had frequent nocturia.  She was going to bed at 9:30 PM and waking up at 7:30 AM.  She continued to be on flecainide  50 mg twice a day in addition to Toprol -XL 50 mg without recurrent arrhythmia.  Is a history of anxiety and has been on a trip to [place] in addition to fluoxetine.  Because of her poor sleep, I referred her for a sleep study.  On Nov 06, 2017 she underwent a diagnostic polysomnogram.  This was notable in that she did not have any significant obstructive sleep apnea overall.  Her AHI was 0.3/h.  However, she had increased arousals and her RDI was increased to 12.6.  She was unable to achieve any REM sleep and it was possible that potential of the severity of sleep disordered breathing may have been underestimated due to the absence of REM sleep on this study.  She did not have any significant oxygen desaturation with a nadir of 92%.  There was markedly reduced sleep efficiency at 50.9%.  There was soft snoring. She did not have clinically significant periodic limb movements during sleep.  I have not seen her since her last evaluation with me in August 2019 at which time she was maintaining sinus rhythm with flecainide  for her PAF/SVT and her blood pressure and lipids were controlled.  She was evaluated by Tacy Expose, PA-C in November 2020 and remained stable from a cardiac standpoint.  She did experience an isolated episode  of palpitations 1 month previously which occurred after she had forgotten to take her medication.  I saw her in November 2021 at which time she continued to feel well.  She was experiencing occasional short-lived palpitations which seem to be precipitated by stress.  She was only  taking metoprolol  tartrate 25 mg once a day.  She is followed by Dr. Schuyler Custard.  She is on low-dose rosuvastatin  at 5 mg.  She continued to be on flecainide  50 mg twice a day.  She was unaware of breakthrough AF.  She had undergone  recent laboratory at [facility].  Hemoglobin 12.9, hematocrit was 38.0.  Creatinine was normal at 0.9.  LFTs were normal.  Ferritin was 82.  TSH 1.07.  B12 1514.  Vitamin D  96.  She has continued to be followed by Dr. Gudena for her remote breast cancer which has been stable.    I saw her in April 2022 and since her prior evaluation she had sstopped taking rosuvastatin  and was now been on Livalo  2 mg for the past 6 weeks.  She was unaware of any recurrent atrial fibrillation.  She has not had recent laboratory since Livalo  initiation.  She denies any chest pain, PND orthopnea.   I recommended that she undergo follow-up laboratory.  She was to follow-up with Dr. Godina for her remote breast cancer and Dr. Elvin Hammer from a GI standpoint.  At Dr. Alberta Almond most recent evaluation Nexium  was increased to 40 mg daily and she was advised on reflux precautions.  She was scheduled to undergo modified barium swallow to rule out oropharyngeal dysphagia with aspiration and he felt she ultimately would need upper endoscopy with probable esophageal dilatation.  When I last saw her in May 18, 2021 she felt well.  At that time she was experiencing some left chest wall tenderness and also complaining of some mild exertional chest pressure over the previous several months.  On exam she had mild tenderness over the left costochondral region suggesting musculoskeletal etiology.  Her blood pressure was elevated and I recommended the addition of amlodipine  5 mg which would be helpful both for blood pressure control, CAD as well as potential esophageal spasm.  I recommended that she undergo coronary CTA for further evaluation of her exertional chest pain component and recommended follow-up laboratory.  She was on Nexium  for GERD and is closely followed by Dr. Elvin Hammer.  She continues to be on flecainide  50 mg twice a day without recurrent AF and metoprolol  succinate 25 mg daily.  She underwent coronary CTA on June 08, 2021.  Calcium  score was 0 and there was no evidence for coronary atherosclerotic plaque.  She did not have any incidental noncardiac findings over read of her CT of her chest.  I last saw her on July 30, 2021.  She denies any recurrent chest wall discomfort.  She admits to being tired.  Her blood pressure at home has been stable around 125-130s systolically over 72.  She was going to bed around 9 falling asleep around 11, experiencing nocturia at least 3 times per night and waking up at 10 AM.  Her sleep was nonrestorative.  During that evaluation, her significant sleep complaints tired, nonrestorative sleep with frequent awakenings and nocturia several times per night I recommended she undergo a subsequent home sleep study for reassessment of potential sleep apnea.  She underwent a sleep study on August 25, 2021 which was a home sleep test.  This revealed severe sleep apnea with an AHI  49.9.  There was no significant oxygen desaturation with O2 nadir at only 91%.  She snored for 34.5 minutes.  I have not seen her since.  She apparently was evaluated by Lawana Pray 02/11/2023.  At time she remained stable without any recurrent atrial fibrillation.  Her sleep study was reviewed and at that time she preferred a trial of an oral appliance rather than initiate CPAP therapy.  He subsequently was evaluated by Dr. Kalman Ores and has a customized oral appliance which she has been using for approximately 9 months with benefit.  She is sleeping well.  She is dreaming.  She is unaware of breakthrough snoring.  Nocturia has decreased.  Presently, she feels well.  However recently she has been under increased stress related to need for significant home improvements.  Associated with the increased stress she has noted slight increase in her prior palpitations.  She has continued to be on flecainide  which she takes one half of the 100 mg pill twice a day.  She is on metoprolol  succinate 25 mg and occasionally has  taken an extra dose during these periods of increased stress.  She also has a prescription for metoprolol  to tartrate.  She is on Repatha  injection every 2 weeks and has prior intolerance to statins.  She is on paroxetine .  She is stable with reference to her prior right breast cancer for which she underwent lobectomy, chemotherapy and radiation.  She has had issues with developing of a subcutaneous cyst of her left breast and is scheduled undergo incision on March 17 by Dr. Enid Harry.  Denies any chest pain.  There is no presyncope or syncope.  She underwent laboratory this morning, results pending.  She presents for evaluation.  Past Medical History:  Diagnosis Date   Allergy    Anemia    as a child   Anxiety    Arthritis    Asthma    Atrial fibrillation (HCC)    Breast cancer (HCC) 2015   Cataract    Depression with anxiety    Dysrhythmia    hx AF   Family history of malignant neoplasm of breast    Fibromyalgia    GERD (gastroesophageal reflux disease)    Heart palpitations    Heartburn    Hyperlipidemia    Invasive ductal carcinoma of right breast (HCC) 02/08/2014   Lower Inner Quadrant   Irregular heartbeat    Malignant neoplasm of breast (female), unspecified site    Migraine    Numbness of arm    Right Arm   Osteopenia    Osteoporosis    Personal history of chemotherapy 2015   Right Breast Cancer   Personal history of radiation therapy 2015   Right Breast Cancer   Prediabetes    Pure hypercholesterolemia    SVT (supraventricular tachycardia) (HCC)     Past Surgical History:  Procedure Laterality Date   30 Day Monitor  04/10/2009 to 05/23/2009   No End Of Summary Report Atrial Fib,Narrow Complex Tachycardia,Sinus Rhythm    ABDOMINAL HYSTERECTOMY  1987   BREAST EXCISIONAL BIOPSY Left    BREAST LUMPECTOMY Right 2015   BREAST LUMPECTOMY WITH NEEDLE LOCALIZATION AND AXILLARY SENTINEL LYMPH NODE BX Right 04/02/2014   Procedure: Right Axillary Lymphatic Mapping;  Right axillary Sentinal Node Biopsy; Right Partial Mastectomy after wire localization;  Surgeon: Adalberto Hollow, MD;  Location: [facility] OR;  Service: General;  Laterality: Right;   BREAST SURGERY     Left   CYSTOSCOPY  04/25/2012  Pt may be a candidate for research anti-incontinence procedure protocol   Dipyridamole Myoview   05/27/2009   The post stress myocardial perfusion images show a normal pattern of perfusion in all regions.The post stress ejection fraction is 83 %. Normal  myocardial perfusion imaging This is a low risk scan.   DOPPLER ECHOCARDIOGRAPHY  08/21/2010   Ejecion Fraction =>55% All chambers are normal in size and function No significant change from 2010.   LESION REMOVAL Left 09/26/2023   Procedure: EXCISION LEFT BREAST SEBACEOUS CYST;  Surgeon: Enid Harry, MD;  Location: [facility];  Service: General;  Laterality: Left;  LOCAL WITH SOME SEDATION (MAC) excision of left breast sebaceous cyst   lower Venous Dopplers  04/11/2009   Normal bilateral lower ext venous duplex   PORTACATH PLACEMENT N/A 04/26/2014   Procedure: ULTRASOUND GUIDED PORT A CATHE INSERTION;  Surgeon: Adalberto Hollow, MD;  Location: [facility] ORS;  Service: General;  Laterality: N/A;   Upper Arterial Dopplers  06/02/2011   Right Side PPG Demonstrates siginificantly diminshed waveforms with the Adson's maneuver which may suggest Thoracic Outlet Syndrone. This is a abnormal thoracic outlet arterial evaluation.    Allergies  Allergen Reactions   Latex Swelling and Rash    Skin swelling    Sulfonamide Derivatives Anaphylaxis, Shortness Of Breath, Nausea Only and Other (See Comments)    Sweating, headache    Clarithromycin Nausea Only and Other (See Comments)    Headache    Crestor  [place] ] Other (See Comments)    Muscle aches, pain, weakness in lower extremities   Statins Other (See Comments)    Muscle soreness and weakness w/ Rosuvastatin  and Zetia .   Ciprofloxacin Hcl Nausea Only,  Palpitations and Other (See Comments)    Headache     Current Outpatient Medications  Medication Sig Dispense Refill   albuterol  (VENTOLIN  HFA) 108 (90 Base) MCG/ACT inhaler Inhale 2 puffs into the lungs every 6 (six) hours as needed for wheezing or shortness of breath. 8 g 2   aspirin  EC 81 MG tablet Take 81 mg by mouth daily.     Calcium  Carbonate-Vitamin D  (CALTRATE 600+D PO) Take by mouth.     cetirizine (ZYRTEC) 10 MG tablet Take 10 mg by mouth daily.     Cholecalciferol (VITAMIN D3) 25 MCG (1000 UT) CAPS Take 1 capsule by mouth daily.     Evolocumab  (REPATHA  SURECLICK) 140 MG/ML SOAJ INJECT 140 MG INTO THE SKIN EVERY 14 DAYS 6 mL 3   flecainide  (TAMBOCOR ) 100 MG tablet Take 1/2 (one-half) tablet by mouth twice daily 90 tablet 2   fluticasone  (FLONASE ) 50 MCG/ACT nasal spray Place 2 sprays into both nostrils daily. 16 g 3   hyoscyamine  (LEVSIN SL) 0.125 MG SL tablet Place 1 tablet (0.125 mg total) under the tongue every 4 (four) hours as needed (Abdominal Pain, spasm, IBS). 60 tablet 4   ibuprofen (ADVIL,MOTRIN) 200 MG tablet Take 200 mg by mouth every 6 (six) hours as needed for headache or moderate pain (pain score 4-6).     Meth-Hyo-M Bl-Na Phos-Ph Sal (URIBEL) 118 MG CAPS Take 118 mg by mouth as needed (for bladder). Reported on 09/09/2015  2   methocarbamol (ROBAXIN) 750 MG tablet Take 750 mg by mouth 4 (four) times daily.     metoprolol  succinate (TOPROL -XL) 25 MG 24 hr tablet Take 37.5mg  (1.5 tablet).  If the palpitations persist take 50mg  (one whole tablet) . 90 tablet 3   PARoxetine  (PAXIL ) 20 MG tablet TAKE 1 TABLET BY  MOUTH ONCE DAILY . APPOINTMENT REQUIRED FOR FUTURE REFILLS (Patient taking differently: Take 10 mg by mouth daily.) 90 tablet 3   valACYclovir  (VALTREX ) 500 MG tablet Take 500 mg by mouth 2 (two) times daily.     No current facility-administered medications for this visit.    Social history is notable in that she is married and has 2 children and 3 grandchildren.  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 There is no tobacco or alcohol use.   Family History  Problem Relation Age of Onset   Skin cancer Mother    Diabetes Sister        Borderline   Hypertension Sister    Heart attack Sister    Breast cancer Sister 4       maternal half-sister; deceased 52   Diabetes Brother        Borderline; maternal half-brother   Stomach cancer Maternal Aunt    Throat cancer Maternal Aunt        stomach and throat; heavy smoker and drinker; deceased 53   Cancer Cousin        breast/ovarian; daughter of mat cousin with unk. primary at 36; pt's 1st cousin-once-removed   Other Father        accident   Anemia Daughter        IDA   Colon cancer Neg Hx    Esophageal cancer Neg Hx    Rectal cancer Neg Hx     ROS General: Negative; No fevers, chills, or night sweats;  HEENT: Negative; No changes in vision or hearing, sinus congestion, difficulty swallowing Pulmonary: Negative; No cough, wheezing, shortness of breath, hemoptysis Cardiovascular: See HPI Positive for occasional ankle swelling GI: Negative; No nausea, vomiting, diarrhea, or abdominal pain GU: Negative; No dysuria, hematuria, or difficulty voiding Musculoskeletal: Negative; no myalgias, joint pain, or weakness Hematologic/Oncology: As above, positive for invasive ductal carcinoma of the right breast .  Status post chemotherapy and radiation treatment following lumpectomy Endocrine: Negative; no heat/cold intolerance; no diabetes Neuro: Negative; no changes in balance, headaches Skin: Negative; No rashes or skin lesions; sebaceous cyst left breast Psychiatric: Negative; No behavioral problems, depression Sleep: Nonrestorative sleep, snoring, frequent nocturia, daytime sleepiness, no bruxism, restless legs, hypnogognic hallucinations, no cataplexy  An Epworth Sleepiness Scale score was calculated in the office today 07/30/21 and this endorsed at  3.  Other comprehensive 14 point system review is negative.   PE BP 132/84   Pulse 61   Ht 5' 3.5" (1.613 m)   Wt 162 lb 6.4 oz (73.7 kg)   LMP 07/06/1985 (Approximate)   SpO2 99%   BMI 28.32 kg/m    Repeat blood pressure by me was improved at 118/64  Wt Readings from Last 3 Encounters:  12/05/23 162 lb 6.4 oz (73.7 kg)  09/26/23 164 lb 0.4 oz (74.4 kg)  09/13/23 164 lb (74.4 kg)   General: Alert, oriented, no distress.  Skin: normal turgor, no rashes, warm and dry HEENT: Normocephalic, atraumatic. Pupils equal round and reactive to light; sclera anicteric; extraocular muscles intact; Nose without nasal septal hypertrophy Mouth/Parynx benign; Mallinpatti scale 3 Neck: No JVD, no carotid bruits; normal carotid upstroke Lungs: clear to ausculatation and percussion; no wheezing or rales Chest wall: without tenderness to palpitation Heart: PMI not displaced, RRR, s1 s2 normal, 1/6 systolic murmur, no diastolic murmur, no rubs, gallops, thrills, or heaves Abdomen: soft, nontender; no hepatosplenomehaly, BS+; abdominal aorta nontender and not dilated by palpation.  Back: no CVA tenderness Pulses 2+ Musculoskeletal: full range of motion, normal strength, no joint deformities Extremities: no clubbing cyanosis or edema, Homan's sign negative  Neurologic: grossly nonfocal; Cranial nerves grossly wnl Psychologic: Normal mood and affect  EKG Interpretation Date/Time:  Monday December 05 2023 10:18:49 EDT Ventricular Rate:  61 PR Interval:  306 QRS Duration:  86 QT Interval:  426 QTC Calculation: 428 R Axis:   16  Text Interpretation: Sinus rhythm with 1st degree A-V block Nonspecific T wave abnormality When compared with ECG of 13-Sep-2023 14:48, No significant change was found Confirmed by Magnus Schuller [phone]) on 12/05/2023 11:02:32 AM    September 13, 2023 ECG (independently read by me): Sinus rhythm at 63, nonspecic T wave abnormality  July 30, 2021 ECG (independently read by me):  Sinus bradycardia at 55, 1st degree AV block PR 250 msec, [facility]  May 18, 2021 ECG (independently read by me): Sinus bradycardia at 52, 1st degree AV block  October 06, 2020 ECG (independently read by me): Sinus bradycardia at 59, 1st degree AV block;  PR 250 msec,   November 2021 ECG (independently read by me):  Sinus bradycardia at 57 bpm with first-degree AV block, PR interval 262 ms.  No ectopy.  Nonspecific T changes.  August 2019 ECG (independently read by me): Normal sinus rhythm at 60 bpm.  First-degree AV block, PR interval 236 ms per;  Nonspecific T changes.  April 2019 ECG (independently read by me): Sinus bradycardia 55 bpm with first-degree AV block.  PR interval 258 ms.  June 2017 ECG (independently read by me): Sinus bradycardia 55 bpm.  First-degree AV block with a PR interval at 250 ms.  Nonspecific ST-T changes.  April 2016 ECG (independently read by me): Normal sinus rhythm at 63 bpm.  First-degree AV block with a PR interval at 254 ms.  QTc interval 49 ms.  Nonspecific T changes.  September 2015 ECG sinus rhythm with first-degree AV block.  Nonspecific T changes V1 through V3.  Prior ECG: Sinus rhythm at 57 beats per minute, with first degree AV block with PR interval 252 ms. QTc interval is normal at 389 ms. Nonspecific T changes.  LABS:     Latest Ref Rng & Units 05/20/2021   10:36 AM 12/05/2020   10:24 AM 05/26/2020   11:02 AM  BMP  Glucose 70 - 99 mg/dL 97  366  98   BUN 8 - 27 mg/dL 9  8  13    Creatinine 0.57 - 1.00 mg/dL 4.40  3.47  4.25   BUN/Creat Ratio 12 - 28 9  9     Sodium 134 - 144 mmol/L 141  143  140   Potassium 3.5 - 5.2 mmol/L 4.3  4.1  3.9   Chloride 96 - 106 mmol/L 104  105  105   CO2 20 - 29 mmol/L 26  24  29    Calcium  8.7 - 10.3 mg/dL 9.6  9.7  9.6         Latest Ref Rng & Units 04/08/2022   12:38 PM 05/20/2021   10:36 AM 12/05/2020   10:24 AM  Hepatic Function  Total Protein 6.0 - 8.5 g/dL 7.2  7.2  7.1   Albumin 3.8 - 4.8 g/dL 4.5   4.6  4.6   AST 0 - 40 IU/L 27  22  21    ALT 0 - 32 IU/L 24  18  19    Alk Phosphatase 44 - 121 IU/L 91  114  112  Total Bilirubin 0.0 - 1.2 mg/dL 0.4  0.6  0.3   Bilirubin, Direct 0.00 - 0.40 mg/dL 6.04           Latest Ref Rng & Units 05/20/2021   10:36 AM 10/12/2017   11:42 AM 05/09/2017    2:25 PM  CBC  WBC 3.4 - 10.8 x10E3/uL 4.0  3.4  4.2   Hemoglobin 11.1 - 15.9 g/dL 54.0  98.1  19.1   Hematocrit 34.0 - 46.6 % 36.3  37.9  37.0   Platelets 150 - 450 x10E3/uL 225  243  275    Lab Results  Component Value Date   MCV 92 05/20/2021   MCV 93 10/12/2017   MCV 90 05/09/2017    Lab Results  Component Value Date   TSH 2.080 10/12/2017   No results found for: "HGBA1C"   Lipid Panel     Component Value Date/Time   CHOL 143 09/13/2023 1425   CHOL 237 (H) 06/22/2013 1405   TRIG 105 09/13/2023 1425   TRIG 103 06/22/2013 1405   HDL 71 09/13/2023 1425   HDL 74 06/22/2013 1405   CHOLHDL 2.0 09/13/2023 1425   CHOLHDL 3.2 07/28/2016 1151   VLDL 22 07/28/2016 1151   LDLCALC 53 09/13/2023 1425   LDLCALC 142 (H) 06/22/2013 1405     BNP No results found for: "PROBNP"  Lipid Panel     Component Value Date/Time   CHOL 143 09/13/2023 1425   CHOL 237 (H) 06/22/2013 1405   TRIG 105 09/13/2023 1425   TRIG 103 06/22/2013 1405   HDL 71 09/13/2023 1425   HDL 74 06/22/2013 1405   CHOLHDL 2.0 09/13/2023 1425   CHOLHDL 3.2 07/28/2016 1151   VLDL 22 07/28/2016 1151   LDLCALC 53 09/13/2023 1425   LDLCALC 142 (H) 06/22/2013 1405     RADIOLOGY: No results found.  IMPRESSION:  1. Pure hypercholesterolemia   2. Essential hypertension   3. PAF (paroxysmal atrial fibrillation) (HCC)   4. First degree heart block    ASSESSMENT AND PLAN: Ms. Oblinger is a 73 year-old African-American female who has a history of PSVT/PAF which has been  controlled with low-dose flecainide  50 twice a day in addition to metoprolol  succinate 25 mg daily.  When I saw her in 2019 she was maintaining  sinus rhythm and had stable first-degree AV block.  Mostly, she had undergone a coronary CTA which showed a calcium  score of 0 without atherosclerosis.  June 08, 2021.  Chest CT over read not show any significant incidental noncardiac findings.  She has a history of right breast CVA which has been stable following lumpectomy, chemotherapy and radiation followed by Dr. Vicky Grange.  She will be undergoing excision of sebaceous cyst on her left breast on March 17 by Dr. Delane Fear.  Her blood pressure today on repeat by me was excellent at 118/64.  Patient admits that she has had increased stress at home related to issues with her house and need of significant work and repair at expense.  This has resulted in increased palpitations.  She has been on baseline metoprolol  succinate 25 mg daily and has a as needed tartrate prescription.  Presently I am recommending she increase metoprolol  succinate to 37.5 mg daily.  She will monitor her heart rate and blood pressure.  If she continues to experience increased palpitations further titration to 50 mg can be undertaken.  I again discussed her home sleep study which demonstrated significant sleep apnea.  She subsequently was evaluated  by Dr. Kalman Ores and has a customized oral appliance.  She believes she is sleeping well has significant recall of her dreams and has less nocturia.  She is now on Repatha  every 2-week injection for hyperlipidemia since she did not tolerate statins remotely.  She apparently had repeat lab work this morning, results pending.  I will contact her regarding the results..  She is aware of my impending retirement later this year.  I will transition her to the care of Dr. Alvis Ba and we will set up for initial evaluation in 6 to 8 months or sooner if issues arise.  It has certainly been a pleasure to take care of her over these many years.    Millicent Ally, MD, FACC 12/05/2023 11:02 AM

## 2023-12-05 NOTE — Patient Instructions (Signed)
 Medication Instructions:  Your physician recommends that you continue on your current medications as directed. Please refer to the Current Medication list given to you today.  *If you need a refill on your cardiac medications before your next appointment, please call your pharmacy*     Follow-Up: At [facility], you and your health needs are our priority.  As part of our continuing mission to provide you with exceptional heart care, our providers are all part of one team.  This team includes your primary Cardiologist (physician) and Advanced Practice Providers or APPs (Physician Assistants and Nurse Practitioners) who all work together to provide you with the care you need, when you need it.  Your next appointment:   6 month(s)  Provider:   Luana Rumple, MD  We recommend signing up for the patient portal called "MyChart".  Sign up information is provided on this After Visit Summary.  MyChart is used to connect with patients for Virtual Visits (Telemedicine).  Patients are able to view lab/test results, encounter notes, upcoming appointments, etc.  Non-urgent messages can be sent to your provider as well.   To learn more about what you can do with MyChart, go to [email or web address].

## 2023-12-08 ENCOUNTER — Ambulatory Visit: Admitting: Hematology and Oncology

## 2023-12-08 ENCOUNTER — Encounter: Payer: Self-pay | Admitting: Cardiovascular Disease

## 2023-12-21 ENCOUNTER — Other Ambulatory Visit: Payer: Self-pay | Admitting: Hematology and Oncology

## 2023-12-21 ENCOUNTER — Other Ambulatory Visit (HOSPITAL_BASED_OUTPATIENT_CLINIC_OR_DEPARTMENT_OTHER): Payer: Self-pay | Admitting: Obstetrics & Gynecology

## 2023-12-21 DIAGNOSIS — N951 Menopausal and female climacteric states: Secondary | ICD-10-CM

## 2023-12-21 DIAGNOSIS — Z1331 Encounter for screening for depression: Secondary | ICD-10-CM | POA: Diagnosis not present

## 2023-12-21 DIAGNOSIS — Z01419 Encounter for gynecological examination (general) (routine) without abnormal findings: Secondary | ICD-10-CM | POA: Diagnosis not present

## 2023-12-21 DIAGNOSIS — F419 Anxiety disorder, unspecified: Secondary | ICD-10-CM

## 2023-12-29 ENCOUNTER — Ambulatory Visit: Admitting: Cardiovascular Disease

## 2024-01-17 DIAGNOSIS — R21 Rash and other nonspecific skin eruption: Secondary | ICD-10-CM | POA: Diagnosis not present

## 2024-02-01 DIAGNOSIS — R110 Nausea: Secondary | ICD-10-CM | POA: Diagnosis not present

## 2024-02-01 DIAGNOSIS — M51362 Other intervertebral disc degeneration, lumbar region with discogenic back pain and lower extremity pain: Secondary | ICD-10-CM | POA: Diagnosis not present

## 2024-02-01 DIAGNOSIS — M159 Polyosteoarthritis, unspecified: Secondary | ICD-10-CM | POA: Diagnosis not present

## 2024-02-01 DIAGNOSIS — R42 Dizziness and giddiness: Secondary | ICD-10-CM | POA: Diagnosis not present

## 2024-02-20 ENCOUNTER — Other Ambulatory Visit: Payer: Self-pay | Admitting: General Practice

## 2024-02-20 MED ORDER — FLECAINIDE ACETATE 100 MG PO TABS: 100.0000 mg | ORAL_TABLET | Freq: Two times a day (BID) | ORAL | 1 refills | Status: DC

## 2024-03-27 ENCOUNTER — Other Ambulatory Visit: Payer: Self-pay | Admitting: Hematology and Oncology

## 2024-03-27 DIAGNOSIS — F419 Anxiety disorder, unspecified: Secondary | ICD-10-CM

## 2024-03-29 DIAGNOSIS — Z23 Encounter for immunization: Secondary | ICD-10-CM | POA: Diagnosis not present

## 2024-04-11 DIAGNOSIS — H524 Presbyopia: Secondary | ICD-10-CM | POA: Diagnosis not present

## 2024-04-11 DIAGNOSIS — H52203 Unspecified astigmatism, bilateral: Secondary | ICD-10-CM | POA: Diagnosis not present

## 2024-04-11 DIAGNOSIS — Z961 Presence of intraocular lens: Secondary | ICD-10-CM | POA: Diagnosis not present

## 2024-04-30 ENCOUNTER — Telehealth: Payer: Self-pay | Admitting: Internal Medicine

## 2024-04-30 DIAGNOSIS — R197 Diarrhea, unspecified: Secondary | ICD-10-CM

## 2024-04-30 NOTE — Telephone Encounter (Signed)
 Abran pt reports she finished amoxicillin  from another provider 2 days ago. Now she states she has had liquid diarrhea for the past 24 hours. She states she has taken 4 Imodium and 6 levsin  and they have not helped at all. She states she had about 10 liquid stools yesterday and about 3-4 during the night. Pt wants to know what she should do. Please advise.

## 2024-04-30 NOTE — Telephone Encounter (Signed)
 Spoke with pt and she is aware of recommendations per Ellouise Console PA. She knows to come to the lab to pick up stool kit, order in epic.

## 2024-04-30 NOTE — Telephone Encounter (Signed)
 Inbound call from patient stating she's been having diarrhea for the last 24 hours and is now experiencing extreme abdomen cramping. Patient would like to speak to nurse and be advised on what to do. Please advise  Thank you

## 2024-05-02 ENCOUNTER — Other Ambulatory Visit

## 2024-05-02 DIAGNOSIS — R197 Diarrhea, unspecified: Secondary | ICD-10-CM | POA: Diagnosis not present

## 2024-05-03 LAB — CLOSTRIDIUM DIFFICILE BY PCR: Toxigenic C. Difficile by PCR: NEGATIVE

## 2024-05-04 NOTE — Telephone Encounter (Signed)
 Tami [name]- Please see results and advise.[email or web address]

## 2024-05-04 NOTE — Telephone Encounter (Signed)
 PT is calling to go over results of stool sample.Please advise.

## 2024-05-06 ENCOUNTER — Ambulatory Visit: Payer: Self-pay | Admitting: Physician Assistant

## 2024-05-07 NOTE — Telephone Encounter (Signed)
 Patient is advised that C Diff testing returned negative. She states that she is no longer having diarrhea and is now having normal bowel movements.

## 2024-05-09 DIAGNOSIS — Z7982 Long term (current) use of aspirin: Secondary | ICD-10-CM | POA: Diagnosis not present

## 2024-05-09 DIAGNOSIS — I4891 Unspecified atrial fibrillation: Secondary | ICD-10-CM | POA: Diagnosis not present

## 2024-05-09 DIAGNOSIS — I129 Hypertensive chronic kidney disease with stage 1 through stage 4 chronic kidney disease, or unspecified chronic kidney disease: Secondary | ICD-10-CM | POA: Diagnosis not present

## 2024-05-09 DIAGNOSIS — D6869 Other thrombophilia: Secondary | ICD-10-CM | POA: Diagnosis not present

## 2024-05-09 DIAGNOSIS — J4489 Other specified chronic obstructive pulmonary disease: Secondary | ICD-10-CM | POA: Diagnosis not present

## 2024-05-09 DIAGNOSIS — I2510 Atherosclerotic heart disease of native coronary artery without angina pectoris: Secondary | ICD-10-CM | POA: Diagnosis not present

## 2024-05-09 DIAGNOSIS — K219 Gastro-esophageal reflux disease without esophagitis: Secondary | ICD-10-CM | POA: Diagnosis not present

## 2024-05-09 DIAGNOSIS — E785 Hyperlipidemia, unspecified: Secondary | ICD-10-CM | POA: Diagnosis not present

## 2024-05-09 DIAGNOSIS — F411 Generalized anxiety disorder: Secondary | ICD-10-CM | POA: Diagnosis not present

## 2024-06-08 ENCOUNTER — Other Ambulatory Visit: Payer: Self-pay

## 2024-06-12 ENCOUNTER — Telehealth: Payer: Self-pay | Admitting: Cardiovascular Disease

## 2024-06-12 MED ORDER — METOPROLOL SUCCINATE ER 25 MG PO TB24: ORAL_TABLET | ORAL | 0 refills | Status: DC

## 2024-06-12 NOTE — Telephone Encounter (Signed)
 S/w the patient, appt made for 07/04/24 at 1000 with Dr Francyne

## 2024-06-12 NOTE — Addendum Note (Signed)
 Addended by: BLUFORD RAMP D on: 06/12/2024 02:42 PM   Modules accepted: Orders

## 2024-06-12 NOTE — Telephone Encounter (Signed)
 Pt c/o medication issue:  1. Name of Medication: metoprolol  succinate (TOPROL -XL) 25 MG 24 hr tablet   2. How are you currently taking this medication (dosage and times per day)? Unknown   3. Are you having a reaction (difficulty breathing--STAT)? no  4. What is your medication issue? Pharmacy called to verify directions and dose please advise

## 2024-06-12 NOTE — Telephone Encounter (Signed)
 Pt's pharmacy faxed over clarification for directions. Medication resent with correct clarification. Confirmation received.

## 2024-06-12 NOTE — Telephone Encounter (Signed)
 Pt requesting a refill for Metoprolol .  I have sent that in.  Pt was pt of Dr. Burnard who wanted pt to follow-up with Dr. Francyne.  Tried to schedule and didn't see anything available. Pt would like to see him and is aware I will send a message to his RN, Izetta, and aware someone will call her back with an appointment.

## 2024-06-12 NOTE — Telephone Encounter (Signed)
 Please review and clarify instructions for metoprolol .

## 2024-06-12 NOTE — Telephone Encounter (Signed)
*  STAT* If patient is at the pharmacy, call can be transferred to refill team.   1. Which medications need to be refilled? (please list name of each medication and dose if known)  metoprolol succinate (TOPROL XL) 25 MG 24 hr tablet  2. Which pharmacy/location (including street and city if local pharmacy) is medication to be sent to? [facility]  3. Do they need a 30 day or 90 day supply? [name]

## 2024-06-12 NOTE — Telephone Encounter (Signed)
  Tami [name], CMA  Certified Medical Assistant   Telephone Encounter Signed   Creation Time: 06/12/2024  2:41 PM   Signed     Pt's pharmacy faxed over clarification for directions. Medication resent with correct clarification. Confirmation received.           This was taken care of in another encounter, sent via fax

## 2024-06-16 ENCOUNTER — Inpatient Hospital Stay (HOSPITAL_BASED_OUTPATIENT_CLINIC_OR_DEPARTMENT_OTHER)
Admission: RE | Admit: 2024-06-16 | Discharge: 2024-06-16 | Attending: Obstetrics & Gynecology | Admitting: Obstetrics & Gynecology

## 2024-06-16 DIAGNOSIS — N951 Menopausal and female climacteric states: Secondary | ICD-10-CM

## 2024-06-23 ENCOUNTER — Other Ambulatory Visit: Payer: Self-pay | Admitting: Hematology and Oncology

## 2024-06-23 DIAGNOSIS — F419 Anxiety disorder, unspecified: Secondary | ICD-10-CM

## 2024-06-25 ENCOUNTER — Other Ambulatory Visit: Payer: Self-pay | Admitting: Pharmacist

## 2024-06-25 MED ORDER — REPATHA SURECLICK 140 MG/ML ~~LOC~~ SOAJ: 140.0000 mg | SUBCUTANEOUS | 3 refills | Status: AC

## 2024-07-04 ENCOUNTER — Ambulatory Visit: Admitting: Cardiovascular Disease

## 2024-07-14 ENCOUNTER — Other Ambulatory Visit: Payer: Self-pay | Admitting: Cardiovascular Disease

## 2024-08-07 ENCOUNTER — Other Ambulatory Visit: Payer: Self-pay | Admitting: Cardiovascular Disease

## 2024-09-04 ENCOUNTER — Ambulatory Visit: Admitting: Emergency Medicine
